# Patient Record
Sex: Male | Born: 1950 | Race: White | Hispanic: No | Marital: Married | Smoking: Never smoker
Health system: Southern US, Community
[De-identification: ages and names within clinical notes are randomized; demographics above are authoritative.]

## PROBLEM LIST (undated history)

## (undated) DIAGNOSIS — M5416 Radiculopathy, lumbar region: Secondary | ICD-10-CM

## (undated) DIAGNOSIS — C4490 Unspecified malignant neoplasm of skin, unspecified: Secondary | ICD-10-CM

## (undated) DIAGNOSIS — I4510 Unspecified right bundle-branch block: Secondary | ICD-10-CM

## (undated) DIAGNOSIS — I82409 Acute embolism and thrombosis of unspecified deep veins of unspecified lower extremity: Secondary | ICD-10-CM

## (undated) DIAGNOSIS — I679 Cerebrovascular disease, unspecified: Secondary | ICD-10-CM

## (undated) DIAGNOSIS — I779 Disorder of arteries and arterioles, unspecified: Secondary | ICD-10-CM

## (undated) DIAGNOSIS — K5792 Diverticulitis of intestine, part unspecified, without perforation or abscess without bleeding: Secondary | ICD-10-CM

## (undated) DIAGNOSIS — M51369 Other intervertebral disc degeneration, lumbar region without mention of lumbar back pain or lower extremity pain: Secondary | ICD-10-CM

## (undated) DIAGNOSIS — N400 Enlarged prostate without lower urinary tract symptoms: Secondary | ICD-10-CM

## (undated) DIAGNOSIS — I10 Essential (primary) hypertension: Secondary | ICD-10-CM

## (undated) DIAGNOSIS — E11319 Type 2 diabetes mellitus with unspecified diabetic retinopathy without macular edema: Secondary | ICD-10-CM

## (undated) DIAGNOSIS — M5136 Other intervertebral disc degeneration, lumbar region: Secondary | ICD-10-CM

## (undated) DIAGNOSIS — I219 Acute myocardial infarction, unspecified: Secondary | ICD-10-CM

## (undated) DIAGNOSIS — M1990 Unspecified osteoarthritis, unspecified site: Secondary | ICD-10-CM

## (undated) DIAGNOSIS — C801 Malignant (primary) neoplasm, unspecified: Secondary | ICD-10-CM

## (undated) DIAGNOSIS — K08109 Complete loss of teeth, unspecified cause, unspecified class: Secondary | ICD-10-CM

## (undated) DIAGNOSIS — I639 Cerebral infarction, unspecified: Secondary | ICD-10-CM

## (undated) DIAGNOSIS — I2510 Atherosclerotic heart disease of native coronary artery without angina pectoris: Secondary | ICD-10-CM

## (undated) DIAGNOSIS — Z789 Other specified health status: Secondary | ICD-10-CM

## (undated) DIAGNOSIS — E7800 Pure hypercholesterolemia, unspecified: Secondary | ICD-10-CM

## (undated) DIAGNOSIS — M4810 Ankylosing hyperostosis [Forestier], site unspecified: Secondary | ICD-10-CM

## (undated) DIAGNOSIS — I209 Angina pectoris, unspecified: Secondary | ICD-10-CM

## (undated) DIAGNOSIS — K219 Gastro-esophageal reflux disease without esophagitis: Secondary | ICD-10-CM

## (undated) DIAGNOSIS — E119 Type 2 diabetes mellitus without complications: Secondary | ICD-10-CM

## (undated) DIAGNOSIS — I2699 Other pulmonary embolism without acute cor pulmonale: Secondary | ICD-10-CM

## (undated) DIAGNOSIS — Z972 Presence of dental prosthetic device (complete) (partial): Secondary | ICD-10-CM

## (undated) DIAGNOSIS — D126 Benign neoplasm of colon, unspecified: Secondary | ICD-10-CM

## (undated) DIAGNOSIS — M48061 Spinal stenosis, lumbar region without neurogenic claudication: Secondary | ICD-10-CM

## (undated) DIAGNOSIS — G4730 Sleep apnea, unspecified: Secondary | ICD-10-CM

## (undated) HISTORY — PX: JOINT REPLACEMENT: SHX530

## (undated) HISTORY — PX: BUNIONECTOMY: SHX129

## (undated) HISTORY — PX: TOTAL HIP ARTHROPLASTY: SHX124

## (undated) HISTORY — DX: Benign prostatic hyperplasia without lower urinary tract symptoms: N40.0

## (undated) HISTORY — PX: KNEE ARTHROSCOPY: SUR90

## (undated) HISTORY — PX: COLONOSCOPY: SHX174

## (undated) HISTORY — DX: Acute myocardial infarction, unspecified: I21.9

## (undated) HISTORY — PX: TOTAL KNEE ARTHROPLASTY: SHX125

## (undated) HISTORY — PX: OTHER SURGICAL HISTORY: SHX169

## (undated) HISTORY — PX: CHOLECYSTECTOMY: SHX55

---

## 2005-06-18 ENCOUNTER — Other Ambulatory Visit: Payer: Self-pay

## 2005-07-03 ENCOUNTER — Inpatient Hospital Stay: Payer: Self-pay | Admitting: General Practice

## 2006-10-05 ENCOUNTER — Ambulatory Visit: Payer: Self-pay | Admitting: General Practice

## 2007-01-08 ENCOUNTER — Ambulatory Visit: Payer: Self-pay | Admitting: Family Medicine

## 2007-01-08 ENCOUNTER — Ambulatory Visit: Payer: Self-pay | Admitting: Unknown Physician Specialty

## 2007-01-08 ENCOUNTER — Other Ambulatory Visit: Payer: Self-pay

## 2007-02-18 ENCOUNTER — Ambulatory Visit: Payer: Self-pay | Admitting: General Practice

## 2007-06-13 ENCOUNTER — Ambulatory Visit: Payer: Self-pay | Admitting: Gastroenterology

## 2008-04-09 ENCOUNTER — Ambulatory Visit: Payer: Self-pay | Admitting: Rheumatology

## 2008-10-19 ENCOUNTER — Ambulatory Visit: Payer: Self-pay | Admitting: Internal Medicine

## 2008-10-22 ENCOUNTER — Ambulatory Visit: Payer: Self-pay | Admitting: Internal Medicine

## 2008-10-27 ENCOUNTER — Ambulatory Visit: Payer: Self-pay | Admitting: Internal Medicine

## 2008-11-01 ENCOUNTER — Ambulatory Visit: Payer: Self-pay | Admitting: Internal Medicine

## 2008-11-15 ENCOUNTER — Ambulatory Visit: Payer: Self-pay | Admitting: Internal Medicine

## 2009-09-01 ENCOUNTER — Inpatient Hospital Stay: Payer: Self-pay | Admitting: Unknown Physician Specialty

## 2009-12-28 ENCOUNTER — Ambulatory Visit: Payer: Self-pay | Admitting: General Practice

## 2010-01-03 ENCOUNTER — Ambulatory Visit: Payer: Self-pay | Admitting: Anesthesiology

## 2010-01-06 ENCOUNTER — Ambulatory Visit: Payer: Self-pay | Admitting: General Practice

## 2010-04-07 ENCOUNTER — Ambulatory Visit: Payer: Self-pay | Admitting: Internal Medicine

## 2010-06-15 ENCOUNTER — Ambulatory Visit: Payer: Self-pay | Admitting: Podiatry

## 2010-12-19 ENCOUNTER — Ambulatory Visit: Payer: Self-pay | Admitting: General Practice

## 2011-01-01 ENCOUNTER — Inpatient Hospital Stay: Payer: Self-pay | Admitting: General Practice

## 2011-01-02 LAB — BASIC METABOLIC PANEL
BUN: 26 mg/dL — ABNORMAL HIGH (ref 7–18)
Chloride: 107 mmol/L (ref 98–107)
EGFR (Non-African Amer.): 56 — ABNORMAL LOW
Glucose: 147 mg/dL — ABNORMAL HIGH (ref 65–99)
Potassium: 4 mmol/L (ref 3.5–5.1)
Sodium: 142 mmol/L (ref 136–145)

## 2011-01-02 LAB — HEMOGLOBIN: HGB: 10.5 g/dL — ABNORMAL LOW (ref 13.0–18.0)

## 2011-01-03 LAB — BASIC METABOLIC PANEL
BUN: 22 mg/dL — ABNORMAL HIGH (ref 7–18)
Calcium, Total: 8.5 mg/dL (ref 8.5–10.1)
Co2: 23 mmol/L (ref 21–32)
Creatinine: 1.38 mg/dL — ABNORMAL HIGH (ref 0.60–1.30)
EGFR (African American): >60
EGFR (Non-African Amer.): 56 — ABNORMAL LOW
Osmolality: 278 (ref 275–301)
Sodium: 135 mmol/L — ABNORMAL LOW (ref 136–145)

## 2011-01-03 LAB — PLATELET COUNT: Platelet: 150 10*3/uL (ref 150–440)

## 2011-02-22 ENCOUNTER — Ambulatory Visit: Payer: Self-pay | Admitting: Internal Medicine

## 2011-10-25 ENCOUNTER — Ambulatory Visit: Payer: Self-pay | Admitting: General Practice

## 2011-10-25 LAB — BASIC METABOLIC PANEL
Calcium, Total: 9.2 mg/dL (ref 8.5–10.1)
Creatinine: 1.84 mg/dL — ABNORMAL HIGH (ref 0.60–1.30)
EGFR (African American): 45 — ABNORMAL LOW
EGFR (Non-African Amer.): 39 — ABNORMAL LOW
Glucose: 147 mg/dL — ABNORMAL HIGH (ref 65–99)
Osmolality: 289 (ref 275–301)
Potassium: 4.3 mmol/L (ref 3.5–5.1)
Sodium: 139 mmol/L (ref 136–145)

## 2011-10-25 LAB — URINALYSIS, COMPLETE
Bilirubin,UR: NEGATIVE
Glucose,UR: NEGATIVE mg/dL (ref 0–75)
Hyaline Cast: 13
Ketone: NEGATIVE
Ph: 5 (ref 4.5–8.0)
RBC,UR: NONE SEEN /HPF (ref 0–5)
Squamous Epithelial: NONE SEEN

## 2011-10-25 LAB — CBC
MCH: 33.3 pg (ref 26.0–34.0)
MCHC: 34.8 g/dL (ref 32.0–36.0)
MCV: 96 fL (ref 80–100)
Platelet: 220 10*3/uL (ref 150–440)
RBC: 4.14 10*6/uL — ABNORMAL LOW (ref 4.40–5.90)
RDW: 13.1 % (ref 11.5–14.5)
WBC: 9.2 10*3/uL (ref 3.8–10.6)

## 2011-10-25 LAB — MRSA PCR SCREENING

## 2011-11-05 ENCOUNTER — Inpatient Hospital Stay: Payer: Self-pay | Admitting: General Practice

## 2011-11-06 LAB — BASIC METABOLIC PANEL
Anion Gap: 9 (ref 7–16)
BUN: 24 mg/dL — ABNORMAL HIGH (ref 7–18)
Calcium, Total: 8.5 mg/dL (ref 8.5–10.1)
Chloride: 107 mmol/L (ref 98–107)
EGFR (African American): >60
EGFR (Non-African Amer.): 53 — ABNORMAL LOW
Glucose: 148 mg/dL — ABNORMAL HIGH (ref 65–99)
Osmolality: 281 (ref 275–301)
Potassium: 5.2 mmol/L — ABNORMAL HIGH (ref 3.5–5.1)
Sodium: 137 mmol/L (ref 136–145)

## 2011-11-07 LAB — BASIC METABOLIC PANEL
Anion Gap: 10 (ref 7–16)
BUN: 23 mg/dL — ABNORMAL HIGH (ref 7–18)
Calcium, Total: 8.5 mg/dL (ref 8.5–10.1)
Chloride: 103 mmol/L (ref 98–107)
EGFR (African American): >60
EGFR (Non-African Amer.): 59 — ABNORMAL LOW
Glucose: 198 mg/dL — ABNORMAL HIGH (ref 65–99)
Osmolality: 279 (ref 275–301)
Potassium: 3.9 mmol/L (ref 3.5–5.1)

## 2011-11-07 LAB — HEMOGLOBIN: HGB: 10.2 g/dL — ABNORMAL LOW (ref 13.0–18.0)

## 2012-08-22 ENCOUNTER — Ambulatory Visit: Payer: Self-pay | Admitting: Cardiology

## 2012-08-22 HISTORY — PX: LEFT HEART CATH AND CORONARY ANGIOGRAPHY: CATH118249

## 2013-03-03 ENCOUNTER — Ambulatory Visit: Payer: Self-pay | Admitting: General Practice

## 2013-03-03 DIAGNOSIS — I10 Essential (primary) hypertension: Secondary | ICD-10-CM

## 2013-03-03 LAB — APTT: Activated PTT: 29.2 secs (ref 23.6–35.9)

## 2013-03-03 LAB — CBC
HCT: 41.3 % (ref 40.0–52.0)
HGB: 14.2 g/dL (ref 13.0–18.0)
MCH: 33.4 pg (ref 26.0–34.0)
MCHC: 34.5 g/dL (ref 32.0–36.0)
MCV: 97 fL (ref 80–100)
PLATELETS: 167 10*3/uL (ref 150–440)
RBC: 4.26 10*6/uL — AB (ref 4.40–5.90)
RDW: 13.7 % (ref 11.5–14.5)
WBC: 6.5 10*3/uL (ref 3.8–10.6)

## 2013-03-03 LAB — BASIC METABOLIC PANEL
Anion Gap: 6 — ABNORMAL LOW (ref 7–16)
BUN: 23 mg/dL — AB (ref 7–18)
CALCIUM: 8.6 mg/dL (ref 8.5–10.1)
CREATININE: 1.48 mg/dL — AB (ref 0.60–1.30)
Chloride: 103 mmol/L (ref 98–107)
Co2: 27 mmol/L (ref 21–32)
EGFR (African American): 58 — ABNORMAL LOW
EGFR (Non-African Amer.): 50 — ABNORMAL LOW
GLUCOSE: 207 mg/dL — AB (ref 65–99)
Osmolality: 282 (ref 275–301)
Potassium: 3.9 mmol/L (ref 3.5–5.1)
Sodium: 136 mmol/L (ref 136–145)

## 2013-03-03 LAB — URINALYSIS, COMPLETE
BACTERIA: NONE SEEN
Bilirubin,UR: NEGATIVE
Blood: NEGATIVE
Glucose,UR: 50 mg/dL (ref 0–75)
Ketone: NEGATIVE
Leukocyte Esterase: NEGATIVE
Nitrite: NEGATIVE
PH: 6 (ref 4.5–8.0)
Protein: NEGATIVE
RBC,UR: 1 /HPF (ref 0–5)
SPECIFIC GRAVITY: 1.023 (ref 1.003–1.030)
SQUAMOUS EPITHELIAL: NONE SEEN
WBC UR: 1 /HPF (ref 0–5)

## 2013-03-03 LAB — PROTIME-INR
INR: 1
Prothrombin Time: 12.6 secs (ref 11.5–14.7)

## 2013-03-03 LAB — MRSA PCR SCREENING

## 2013-03-03 LAB — SEDIMENTATION RATE: Erythrocyte Sed Rate: 9 mm/hr (ref 0–20)

## 2013-03-04 LAB — URINE CULTURE

## 2013-03-18 ENCOUNTER — Inpatient Hospital Stay: Payer: Self-pay | Admitting: General Practice

## 2013-03-19 LAB — BASIC METABOLIC PANEL
Anion Gap: 4 — ABNORMAL LOW (ref 7–16)
BUN: 23 mg/dL — ABNORMAL HIGH (ref 7–18)
Calcium, Total: 8.1 mg/dL — ABNORMAL LOW (ref 8.5–10.1)
Chloride: 107 mmol/L (ref 98–107)
Co2: 27 mmol/L (ref 21–32)
Creatinine: 1.36 mg/dL — ABNORMAL HIGH (ref 0.60–1.30)
EGFR (African American): >60
EGFR (Non-African Amer.): 56 — ABNORMAL LOW
Glucose: 139 mg/dL — ABNORMAL HIGH (ref 65–99)
Osmolality: 282 (ref 275–301)
Potassium: 4.3 mmol/L (ref 3.5–5.1)
Sodium: 138 mmol/L (ref 136–145)

## 2013-03-19 LAB — PLATELET COUNT: PLATELETS: 148 10*3/uL — AB (ref 150–440)

## 2013-03-19 LAB — HEMOGLOBIN: HGB: 12.5 g/dL — ABNORMAL LOW (ref 13.0–18.0)

## 2013-03-20 LAB — BASIC METABOLIC PANEL
Anion Gap: 3 — ABNORMAL LOW (ref 7–16)
BUN: 27 mg/dL — ABNORMAL HIGH (ref 7–18)
CO2: 29 mmol/L (ref 21–32)
Calcium, Total: 8.5 mg/dL (ref 8.5–10.1)
Chloride: 105 mmol/L (ref 98–107)
Creatinine: 1.38 mg/dL — ABNORMAL HIGH (ref 0.60–1.30)
EGFR (African American): >60
EGFR (Non-African Amer.): 55 — ABNORMAL LOW
Glucose: 75 mg/dL (ref 65–99)
Osmolality: 278 (ref 275–301)
POTASSIUM: 3.6 mmol/L (ref 3.5–5.1)
Sodium: 137 mmol/L (ref 136–145)

## 2013-03-20 LAB — HEMOGLOBIN: HGB: 12.8 g/dL — AB (ref 13.0–18.0)

## 2013-03-20 LAB — PLATELET COUNT: PLATELETS: 156 10*3/uL (ref 150–440)

## 2013-04-06 ENCOUNTER — Encounter: Payer: Self-pay | Admitting: General Practice

## 2013-05-01 ENCOUNTER — Encounter: Payer: Self-pay | Admitting: General Practice

## 2013-11-05 ENCOUNTER — Ambulatory Visit: Payer: Self-pay | Admitting: Podiatry

## 2013-12-22 ENCOUNTER — Inpatient Hospital Stay: Payer: Self-pay | Admitting: Internal Medicine

## 2013-12-22 LAB — COMPREHENSIVE METABOLIC PANEL
AST: 42 U/L — AB (ref 15–37)
Albumin: 2.7 g/dL — ABNORMAL LOW (ref 3.4–5.0)
Alkaline Phosphatase: 113 U/L
Anion Gap: 11 (ref 7–16)
BILIRUBIN TOTAL: 0.8 mg/dL (ref 0.2–1.0)
BUN: 38 mg/dL — ABNORMAL HIGH (ref 7–18)
CALCIUM: 9.3 mg/dL (ref 8.5–10.1)
CO2: 22 mmol/L (ref 21–32)
CREATININE: 1.47 mg/dL — AB (ref 0.60–1.30)
Chloride: 96 mmol/L — ABNORMAL LOW (ref 98–107)
EGFR (Non-African Amer.): 52 — ABNORMAL LOW
Glucose: 345 mg/dL — ABNORMAL HIGH (ref 65–99)
Osmolality: 282 (ref 275–301)
Potassium: 3.7 mmol/L (ref 3.5–5.1)
SGPT (ALT): 55 U/L
Sodium: 129 mmol/L — ABNORMAL LOW (ref 136–145)
TOTAL PROTEIN: 6.9 g/dL (ref 6.4–8.2)

## 2013-12-22 LAB — CBC WITH DIFFERENTIAL/PLATELET
Basophil #: 0 10*3/uL (ref 0.0–0.1)
Basophil %: 0.2 %
EOS ABS: 0 10*3/uL (ref 0.0–0.7)
Eosinophil %: 0 %
HCT: 43 % (ref 40.0–52.0)
HGB: 14.4 g/dL (ref 13.0–18.0)
Lymphocyte #: 0.4 10*3/uL — ABNORMAL LOW (ref 1.0–3.6)
Lymphocyte %: 2.1 %
MCH: 32.4 pg (ref 26.0–34.0)
MCHC: 33.5 g/dL (ref 32.0–36.0)
MCV: 97 fL (ref 80–100)
Monocyte #: 1.1 x10 3/mm — ABNORMAL HIGH (ref 0.2–1.0)
Monocyte %: 5.1 %
NEUTROS ABS: 19.4 10*3/uL — AB (ref 1.4–6.5)
Neutrophil %: 92.6 %
PLATELETS: 166 10*3/uL (ref 150–440)
RBC: 4.45 10*6/uL (ref 4.40–5.90)
RDW: 13.9 % (ref 11.5–14.5)
WBC: 20.9 10*3/uL — ABNORMAL HIGH (ref 3.8–10.6)

## 2013-12-22 LAB — URINALYSIS, COMPLETE
Bacteria: NONE SEEN
Bilirubin,UR: NEGATIVE
LEUKOCYTE ESTERASE: NEGATIVE
Nitrite: NEGATIVE
PH: 6 (ref 4.5–8.0)
Protein: 30
Specific Gravity: 1.022 (ref 1.003–1.030)
Squamous Epithelial: <1
WBC UR: 1 /HPF (ref 0–5)

## 2013-12-22 LAB — BODY FLUID CELL COUNT WITH DIFFERENTIAL
BASOS ABS: 0 %
Eosinophil: 0 %
Lymphocytes: 0 %
Neutrophils: 83 %
Nucleated Cell Count: 211129 /mm3
OTHER CELLS BF: 0 %
OTHER MONONUCLEAR CELLS: 17 %

## 2013-12-22 LAB — SYNOVIAL FLUID, CRYSTAL: Crystals, Joint Fluid: NONE SEEN

## 2013-12-23 LAB — CBC WITH DIFFERENTIAL/PLATELET
Basophil #: 0 10*3/uL (ref 0.0–0.1)
Basophil %: 0.1 %
EOS PCT: 0.1 %
Eosinophil #: 0 10*3/uL (ref 0.0–0.7)
HCT: 37 % — ABNORMAL LOW (ref 40.0–52.0)
HGB: 12.5 g/dL — ABNORMAL LOW (ref 13.0–18.0)
Lymphocyte #: 0.7 10*3/uL — ABNORMAL LOW (ref 1.0–3.6)
Lymphocyte %: 4.6 %
MCH: 32.5 pg (ref 26.0–34.0)
MCHC: 33.8 g/dL (ref 32.0–36.0)
MCV: 96 fL (ref 80–100)
Monocyte #: 1.2 x10 3/mm — ABNORMAL HIGH (ref 0.2–1.0)
Monocyte %: 7.6 %
Neutrophil #: 13.4 10*3/uL — ABNORMAL HIGH (ref 1.4–6.5)
Neutrophil %: 87.6 %
Platelet: 154 10*3/uL (ref 150–440)
RBC: 3.85 10*6/uL — AB (ref 4.40–5.90)
RDW: 13.8 % (ref 11.5–14.5)
WBC: 15.2 10*3/uL — ABNORMAL HIGH (ref 3.8–10.6)

## 2013-12-23 LAB — BASIC METABOLIC PANEL
Anion Gap: 7 (ref 7–16)
BUN: 33 mg/dL — ABNORMAL HIGH (ref 7–18)
CHLORIDE: 98 mmol/L (ref 98–107)
CO2: 28 mmol/L (ref 21–32)
Calcium, Total: 8.9 mg/dL (ref 8.5–10.1)
Creatinine: 1.31 mg/dL — ABNORMAL HIGH (ref 0.60–1.30)
EGFR (African American): >60
EGFR (Non-African Amer.): 59 — ABNORMAL LOW
GLUCOSE: 172 mg/dL — AB (ref 65–99)
OSMOLALITY: 278 (ref 275–301)
POTASSIUM: 3.4 mmol/L — AB (ref 3.5–5.1)
Sodium: 133 mmol/L — ABNORMAL LOW (ref 136–145)

## 2013-12-24 LAB — CBC WITH DIFFERENTIAL/PLATELET
BASOS PCT: 0.3 %
Basophil #: 0 10*3/uL (ref 0.0–0.1)
Eosinophil #: 0 10*3/uL (ref 0.0–0.7)
Eosinophil %: 0.1 %
HCT: 36.5 % — AB (ref 40.0–52.0)
HGB: 12.3 g/dL — ABNORMAL LOW (ref 13.0–18.0)
LYMPHS ABS: 0.9 10*3/uL — AB (ref 1.0–3.6)
Lymphocyte %: 7.4 %
MCH: 32.5 pg (ref 26.0–34.0)
MCHC: 33.6 g/dL (ref 32.0–36.0)
MCV: 97 fL (ref 80–100)
MONO ABS: 0.9 x10 3/mm (ref 0.2–1.0)
MONOS PCT: 7.7 %
NEUTROS ABS: 9.9 10*3/uL — AB (ref 1.4–6.5)
Neutrophil %: 84.5 %
Platelet: 178 10*3/uL (ref 150–440)
RBC: 3.77 10*6/uL — ABNORMAL LOW (ref 4.40–5.90)
RDW: 14.2 % (ref 11.5–14.5)
WBC: 11.7 10*3/uL — ABNORMAL HIGH (ref 3.8–10.6)

## 2013-12-24 LAB — BASIC METABOLIC PANEL
Anion Gap: 9 (ref 7–16)
BUN: 23 mg/dL — ABNORMAL HIGH (ref 7–18)
CHLORIDE: 98 mmol/L (ref 98–107)
Calcium, Total: 8.7 mg/dL (ref 8.5–10.1)
Co2: 26 mmol/L (ref 21–32)
Creatinine: 1.16 mg/dL (ref 0.60–1.30)
EGFR (Non-African Amer.): >60
Glucose: 150 mg/dL — ABNORMAL HIGH (ref 65–99)
Osmolality: 273 (ref 275–301)
POTASSIUM: 3.6 mmol/L (ref 3.5–5.1)
Sodium: 133 mmol/L — ABNORMAL LOW (ref 136–145)

## 2013-12-25 LAB — CBC WITH DIFFERENTIAL/PLATELET
BASOS ABS: 0 10*3/uL (ref 0.0–0.1)
Basophil %: 0.1 %
EOS PCT: 0.7 %
Eosinophil #: 0.1 10*3/uL (ref 0.0–0.7)
HCT: 36.6 % — ABNORMAL LOW (ref 40.0–52.0)
HGB: 12.2 g/dL — ABNORMAL LOW (ref 13.0–18.0)
LYMPHS ABS: 1 10*3/uL (ref 1.0–3.6)
Lymphocyte %: 8.8 %
MCH: 32.3 pg (ref 26.0–34.0)
MCHC: 33.2 g/dL (ref 32.0–36.0)
MCV: 97 fL (ref 80–100)
MONO ABS: 0.9 x10 3/mm (ref 0.2–1.0)
Monocyte %: 7.7 %
Neutrophil #: 9.7 10*3/uL — ABNORMAL HIGH (ref 1.4–6.5)
Neutrophil %: 82.7 %
PLATELETS: 234 10*3/uL (ref 150–440)
RBC: 3.77 10*6/uL — ABNORMAL LOW (ref 4.40–5.90)
RDW: 13.9 % (ref 11.5–14.5)
WBC: 11.7 10*3/uL — ABNORMAL HIGH (ref 3.8–10.6)

## 2013-12-25 LAB — BASIC METABOLIC PANEL
ANION GAP: 5 — AB (ref 7–16)
BUN: 19 mg/dL — AB (ref 7–18)
CHLORIDE: 97 mmol/L — AB (ref 98–107)
CO2: 29 mmol/L (ref 21–32)
Calcium, Total: 8.6 mg/dL (ref 8.5–10.1)
Creatinine: 1.08 mg/dL (ref 0.60–1.30)
EGFR (Non-African Amer.): >60
Glucose: 145 mg/dL — ABNORMAL HIGH (ref 65–99)
OSMOLALITY: 268 (ref 275–301)
POTASSIUM: 3.8 mmol/L (ref 3.5–5.1)
Sodium: 131 mmol/L — ABNORMAL LOW (ref 136–145)

## 2013-12-26 LAB — CBC WITH DIFFERENTIAL/PLATELET
Basophil #: 0 10*3/uL (ref 0.0–0.1)
Basophil %: 0.2 %
Eosinophil #: 0.2 10*3/uL (ref 0.0–0.7)
Eosinophil %: 1.4 %
HCT: 36.9 % — AB (ref 40.0–52.0)
HGB: 12.3 g/dL — ABNORMAL LOW (ref 13.0–18.0)
LYMPHS PCT: 9.1 %
Lymphocyte #: 1.1 10*3/uL (ref 1.0–3.6)
MCH: 32.6 pg (ref 26.0–34.0)
MCHC: 33.4 g/dL (ref 32.0–36.0)
MCV: 98 fL (ref 80–100)
MONO ABS: 1.1 x10 3/mm — AB (ref 0.2–1.0)
MONOS PCT: 8.5 %
NEUTROS PCT: 80.8 %
Neutrophil #: 10.2 10*3/uL — ABNORMAL HIGH (ref 1.4–6.5)
PLATELETS: 258 10*3/uL (ref 150–440)
RBC: 3.79 10*6/uL — ABNORMAL LOW (ref 4.40–5.90)
RDW: 13.9 % (ref 11.5–14.5)
WBC: 12.6 10*3/uL — ABNORMAL HIGH (ref 3.8–10.6)

## 2013-12-26 LAB — BASIC METABOLIC PANEL
ANION GAP: 7 (ref 7–16)
BUN: 17 mg/dL (ref 7–18)
Calcium, Total: 8.9 mg/dL (ref 8.5–10.1)
Chloride: 103 mmol/L (ref 98–107)
Co2: 27 mmol/L (ref 21–32)
Creatinine: 0.92 mg/dL (ref 0.60–1.30)
EGFR (African American): >60
Glucose: 96 mg/dL (ref 65–99)
Osmolality: 275 (ref 275–301)
POTASSIUM: 3.9 mmol/L (ref 3.5–5.1)
SODIUM: 137 mmol/L (ref 136–145)

## 2013-12-26 LAB — HEPATIC FUNCTION PANEL A (ARMC)
ALBUMIN: 1.8 g/dL — AB (ref 3.4–5.0)
Alkaline Phosphatase: 136 U/L — ABNORMAL HIGH
BILIRUBIN TOTAL: 0.4 mg/dL (ref 0.2–1.0)
Bilirubin, Direct: 0.1 mg/dL (ref 0.0–0.2)
SGOT(AST): 61 U/L — ABNORMAL HIGH (ref 15–37)
SGPT (ALT): 128 U/L — ABNORMAL HIGH
TOTAL PROTEIN: 5.5 g/dL — AB (ref 6.4–8.2)

## 2013-12-26 LAB — CLOSTRIDIUM DIFFICILE(ARMC)

## 2013-12-26 LAB — LIPASE, BLOOD: Lipase: 90 U/L (ref 73–393)

## 2013-12-26 LAB — AMYLASE: Amylase: 21 U/L — ABNORMAL LOW (ref 25–115)

## 2013-12-27 LAB — CBC WITH DIFFERENTIAL/PLATELET
Basophil #: 0 10*3/uL (ref 0.0–0.1)
Basophil %: 0.3 %
EOS ABS: 0.2 10*3/uL (ref 0.0–0.7)
Eosinophil %: 1.4 %
HCT: 36.4 % — ABNORMAL LOW (ref 40.0–52.0)
HGB: 12.2 g/dL — ABNORMAL LOW (ref 13.0–18.0)
Lymphocyte #: 1.1 10*3/uL (ref 1.0–3.6)
Lymphocyte %: 8.5 %
MCH: 32.6 pg (ref 26.0–34.0)
MCHC: 33.6 g/dL (ref 32.0–36.0)
MCV: 97 fL (ref 80–100)
Monocyte #: 1 x10 3/mm (ref 0.2–1.0)
Monocyte %: 8.2 %
NEUTROS ABS: 10.3 10*3/uL — AB (ref 1.4–6.5)
Neutrophil %: 81.6 %
PLATELETS: 301 10*3/uL (ref 150–440)
RBC: 3.76 10*6/uL — AB (ref 4.40–5.90)
RDW: 14.1 % (ref 11.5–14.5)
WBC: 12.6 10*3/uL — AB (ref 3.8–10.6)

## 2013-12-27 LAB — CULTURE, BLOOD (SINGLE)

## 2013-12-28 ENCOUNTER — Encounter: Payer: Self-pay | Admitting: Internal Medicine

## 2013-12-28 LAB — HEPATIC FUNCTION PANEL A (ARMC)
Albumin: 1.7 g/dL — ABNORMAL LOW (ref 3.4–5.0)
Alkaline Phosphatase: 139 U/L — ABNORMAL HIGH
Bilirubin, Direct: <0.1 mg/dL (ref 0.0–0.2)
Bilirubin,Total: 0.4 mg/dL (ref 0.2–1.0)
SGOT(AST): 32 U/L (ref 15–37)
SGPT (ALT): 87 U/L — ABNORMAL HIGH
Total Protein: 5.8 g/dL — ABNORMAL LOW (ref 6.4–8.2)

## 2013-12-28 LAB — BODY FLUID CULTURE

## 2013-12-29 LAB — SEDIMENTATION RATE: Erythrocyte Sed Rate: 68 mm/hr — ABNORMAL HIGH (ref 0–20)

## 2013-12-29 LAB — COMPREHENSIVE METABOLIC PANEL
Albumin: 1.8 g/dL — ABNORMAL LOW (ref 3.4–5.0)
Alkaline Phosphatase: 154 U/L — ABNORMAL HIGH
Anion Gap: 17 — ABNORMAL HIGH (ref 7–16)
BUN: 15 mg/dL (ref 7–18)
Bilirubin,Total: 0.4 mg/dL (ref 0.2–1.0)
CREATININE: 1 mg/dL (ref 0.60–1.30)
Calcium, Total: 8.3 mg/dL — ABNORMAL LOW (ref 8.5–10.1)
Chloride: 104 mmol/L (ref 98–107)
Co2: 26 mmol/L (ref 21–32)
EGFR (African American): >60
EGFR (Non-African Amer.): >60
GLUCOSE: 142 mg/dL — AB (ref 65–99)
Osmolality: 296 (ref 275–301)
POTASSIUM: 4.3 mmol/L (ref 3.5–5.1)
SGOT(AST): 37 U/L (ref 15–37)
SGPT (ALT): 87 U/L — ABNORMAL HIGH
SODIUM: 147 mmol/L — AB (ref 136–145)
Total Protein: 6.3 g/dL — ABNORMAL LOW (ref 6.4–8.2)

## 2013-12-29 LAB — CBC WITH DIFFERENTIAL/PLATELET
BASOS PCT: 0.6 %
Basophil #: 0.1 10*3/uL (ref 0.0–0.1)
EOS PCT: 1.3 %
Eosinophil #: 0.2 10*3/uL (ref 0.0–0.7)
HCT: 36.5 % — ABNORMAL LOW (ref 40.0–52.0)
HGB: 12.3 g/dL — ABNORMAL LOW (ref 13.0–18.0)
Lymphocyte #: 1.2 10*3/uL (ref 1.0–3.6)
Lymphocyte %: 8.4 %
MCH: 32.3 pg (ref 26.0–34.0)
MCHC: 33.5 g/dL (ref 32.0–36.0)
MCV: 96 fL (ref 80–100)
MONOS PCT: 8.8 %
Monocyte #: 1.2 x10 3/mm — ABNORMAL HIGH (ref 0.2–1.0)
NEUTROS ABS: 11.4 10*3/uL — AB (ref 1.4–6.5)
Neutrophil %: 80.9 %
PLATELETS: 348 10*3/uL (ref 150–440)
RBC: 3.79 10*6/uL — ABNORMAL LOW (ref 4.40–5.90)
RDW: 13.8 % (ref 11.5–14.5)
WBC: 14.1 10*3/uL — ABNORMAL HIGH (ref 3.8–10.6)

## 2013-12-30 LAB — WOUND CULTURE

## 2014-01-01 ENCOUNTER — Encounter: Payer: Self-pay | Admitting: Internal Medicine

## 2014-01-05 LAB — COMPREHENSIVE METABOLIC PANEL
ALT: 74 U/L — AB
ANION GAP: 6 — AB (ref 7–16)
Albumin: 2.3 g/dL — ABNORMAL LOW (ref 3.4–5.0)
Alkaline Phosphatase: 197 U/L — ABNORMAL HIGH
BILIRUBIN TOTAL: 0.5 mg/dL (ref 0.2–1.0)
BUN: 43 mg/dL — AB (ref 7–18)
CO2: 29 mmol/L (ref 21–32)
Calcium, Total: 9.2 mg/dL (ref 8.5–10.1)
Chloride: 97 mmol/L — ABNORMAL LOW (ref 98–107)
Creatinine: 1.61 mg/dL — ABNORMAL HIGH (ref 0.60–1.30)
GFR CALC AF AMER: 56 — AB
GFR CALC NON AF AMER: 46 — AB
Glucose: 155 mg/dL — ABNORMAL HIGH (ref 65–99)
OSMOLALITY: 278 (ref 275–301)
Potassium: 3.9 mmol/L (ref 3.5–5.1)
SGOT(AST): 23 U/L (ref 15–37)
SODIUM: 132 mmol/L — AB (ref 136–145)
Total Protein: 7.2 g/dL (ref 6.4–8.2)

## 2014-01-05 LAB — CBC WITH DIFFERENTIAL/PLATELET
Basophil #: 0.1 10*3/uL (ref 0.0–0.1)
Basophil %: 1 %
Eosinophil #: 0.3 10*3/uL (ref 0.0–0.7)
Eosinophil %: 3 %
HCT: 36.1 % — ABNORMAL LOW (ref 40.0–52.0)
HGB: 12.2 g/dL — ABNORMAL LOW (ref 13.0–18.0)
Lymphocyte #: 1.7 10*3/uL (ref 1.0–3.6)
Lymphocyte %: 17.1 %
MCH: 32.3 pg (ref 26.0–34.0)
MCHC: 33.8 g/dL (ref 32.0–36.0)
MCV: 96 fL (ref 80–100)
Monocyte #: 1 x10 3/mm (ref 0.2–1.0)
Monocyte %: 10.2 %
Neutrophil #: 6.9 10*3/uL — ABNORMAL HIGH (ref 1.4–6.5)
Neutrophil %: 68.7 %
Platelet: 348 10*3/uL (ref 150–440)
RBC: 3.77 10*6/uL — AB (ref 4.40–5.90)
RDW: 14.1 % (ref 11.5–14.5)
WBC: 10 10*3/uL (ref 3.8–10.6)

## 2014-04-20 NOTE — Op Note (Signed)
PATIENT NAME:  Michael Humphrey, Michael Humphrey MR#:  [id number] DATE OF BIRTH:  1950/12/23  DATE OF PROCEDURE:  11/05/2011  PREOPERATIVE DIAGNOSIS: Degenerative arthrosis of the right hip.   POSTOPERATIVE DIAGNOSIS: Degenerative arthrosis of the right hip.   PROCEDURE PERFORMED: Right total hip arthroplasty.   SURGEON: Laurice Record. Holley Bouche., MD   ASSISTANT: Vance Peper, PA-C (required to maintain retraction throughout the procedure)   ANESTHESIA: Spinal.   ESTIMATED BLOOD LOSS: 100 mL.   FLUIDS REPLACED: 2000 mL of crystalloid.   DRAINS: Two medium drains to Hemovac reservoir.   IMPLANTS UTILIZED: DePuy 15 mm large stature AML femoral component, 56 mm outer diameter Pinnacle Gription Sector component, +4 mm neutral Pinnacle Marathon polyethylene liner, and a 36 mm aSphere femoral head with a +1.5 mm neck length.   INDICATIONS FOR SURGERY: The patient is a 64 year old male who has been seen for complaints of progressive right hip and groin pain with associated limited range of motion. X-rays demonstrated narrowing of the cartilage space consistent with degenerative arthrosis. After discussion of the risks and benefits of surgical intervention, the patient expressed understanding of the risks, benefits, and agreed with plans for surgical intervention.   PROCEDURE IN DETAIL: The patient was brought into the Operating Room, and after adequate spinal anesthesia was achieved the patient was placed in a left lateral decubitus position. An axillary roll was placed and all bony prominences were well padded. The patient's right hip and leg were cleaned and prepped with alcohol and DuraPrep and draped in the usual sterile fashion. A "timeout" was performed as per usual protocol. A lateral curvilinear incision was made gently curving towards the posterior superior iliac spine. The IT band was incised in line with the skin incision, and the fibers of the gluteus maximus were split in line. The piriformis tendon was  identified, skeletonized, and incised at its insertion at the proximal femur and reflected posteriorly. In a similar fashion, short external rotators were incised and reflected posteriorly. A T-type posterior capsulotomy was performed. Prior to dislocation of the femoral head, a threaded Steinmann pin was inserted through a separate stab incision into the pelvis superior to the acetabulum and then bent in the form of a stylus so as to assess limb length and hip offset throughout the procedure. The femoral head was then dislocated posteriorly. Degenerative changes were noted primarily to the superior aspect of the femoral head. The femoral neck cut was performed using an oscillating saw. The anterior capsule was elevated off of the femoral neck using a periosteal elevator. Inspection of the acetabulum also demonstrated degenerative changes. The remnant of the labrum was excised. The acetabulum was reamed in a sequential fashion up to a 55 mm diameter. Good punctate bleeding bone was encountered. A 56 mm outer diameter Pinnacle Gription Sector acetabular component was positioned and impacted into place. Good scratch fit was appreciated. A +4 mm neutral polyethylene trial was inserted, and attention was directed to the proximal femur. A pilot hole for reaming of the proximal femoral canal was created using a high-speed bur. The proximal femoral canal was reamed in sequential fashion up to a 14.5 mm diameter. This allowed for approximately 5.5 to 6 cm of scratch fit. A 50 mm aggressive side-biting reamer was used to prepare the calcar region. Serial broaches were inserted up to a 15 mm large stature broach. The calcar region was planed accordingly and trial reduction was performed with a 36 mm hip ball with a +1.5 mm neck length. This allowed  for excellent stability both anteriorly and posteriorly and good restoration of hip offset and limb length. Trial components were removed. The acetabular shell was irrigated and  suctioned dry. A +4 mm neutral Pinnacle Marathon polyethylene liner was positioned and impacted into place. Next, a 15 mm large stature AML femoral component was positioned and impacted into place. Excellent scratch fit was appreciated. Trial reduction was again performed with a +1.5 mm neck length. Again, excellent stability and maintenance of limb length was appreciated. The trial hip ball was removed. The Morse taper was cleaned and dried. A 36 mm outer diameter aSphere femoral head with a +1.5 mm neck length was placed on the trunnion and impacted into place. The hip was reduced and placed through a range of motion. Excellent stability was appreciated and good restoration of hip offset and equalization of limb lengths was noted.   The wound was irrigated with copious amounts of normal saline with antibiotic solution using pulsatile lavage and then suctioned dry. Good hemostasis was appreciated. The posterior capsulotomy was repaired using #5 Ethibond. The piriformis tendon was reapproximated to the undersurface of the radius gluteus medius tendon using #5 Ethibond. The previously released gluteal sling was also repaired using interrupted sutures of #5 Ethibond. Two medium drains were placed in the wound bed and brought out through a separate stab incision to be attached to the Hemovac reservoir. The IT band was repaired using interrupted sutures of #1 Vicryl. The subcutaneous tissue was approximated in layers using first 0 Vicryl, followed by 2-0 Vicryl. The skin was reapproximated using skin staples. Sterile dressing was applied.   The patient tolerated the procedure well. He was transported to the recovery room in stable condition.  ____________________________ Laurice Record. Holley Bouche., MD [id number] D: 11/05/2011 22:21:09 ET T: 11/06/2011 09:34:29 ET JOB#: [id number]  cc: Laurice Record. Holley Bouche., MD, <Dictator> Richar P Holley Bouche MD ELECTRONICALLY SIGNED 11/06/2011 20:19

## 2014-04-20 NOTE — Discharge Summary (Signed)
PATIENT NAME:  Michael Humphrey, Michael Humphrey MR#:  [id number] DATE OF BIRTH:  1950-02-25  DATE OF ADMISSION:  11/05/2011 DATE OF DISCHARGE:  11/08/2011  ADMITTING DIAGNOSIS: Degenerative arthrosis of the right hip.   DISCHARGE DIAGNOSIS: Degenerative arthrosis of the right hip.  HISTORY: The patient is a 64 year old who has been followed at [facility] for progression of right hip pain. The patient had reported a greater than one year history of right hip and groin pain. He denied any radiation of pain down the leg. He appreciated progressive loss in his right hip range of motion. He denied any gross thigh pain. At the time of surgery, he was not using any ambulatory aid. He had attempted to continue with his regular duty at work but was having increasing difficulty because of severity of pain. It had gotten to the point where he was using Tylenol to help with pain control. It was recommended that he avoid nonsteroidal antiinflammatory medications due to his diabetes and diverticulosis. The patient states that the right hip pain had progressed to the point that it was significantly interfering with his activities of daily living. X-rays taken in the [facility] orthopedic department showed narrowing of the cartilage space with subchondral sclerosis. After discussion of the risks and benefits of surgical intervention, the patient expressed his understanding of the risks and benefits and agreed with plans for surgical intervention.   PROCEDURE: Right total hip arthroplasty.   ANESTHESIA: Spinal.   IMPLANTS UTILIZED: DePuy 15 mm large statue AML femoral component, 56 mm outer diameter Pinnacle Gription sector component, a +4 mm neutral Pinnacle Marathon polyethylene liner, and a 36 mm aSphere femoral head with a +1.5 mm neck length.   HOSPITAL COURSE: The patient tolerated the procedure very well. He had no complications. He was then taken to PAC-U where he was stabilized and then transferred to the orthopedic  floor. He began receiving anticoagulation therapy of Lovenox 30 mg every 12 hours. He was fitted with TED stockings bilaterally. These were allowed to be removed one hour per eight hour shift. The patient was also fitted with the AV-I compression foot pumps set at 80 mmHg bilaterally. He has voiced no complaints of lower extremity discomfort, no evidence of any deep venous thromboses, and calves have been nontender. Heels were elevated off the bed using rolled towels.   The patient has denied any chest pain or shortness of breath. Vital signs have been stable. He has been afebrile. Hemodynamically he was stable and no transfusions were given.   Physical therapy was initiated on day one for gait training and transfers. Upon being discharged he was ambulating greater than 200 feet. He was able to go up and down four sets of steps. He was independent with bed to chair transfers. Occupational therapy was also initiated on day one for activities of daily living and assistive devices.   The patient's IV, Foley, and Hemovac were discontinued on day two along with a dressing change. The wound was free of any drainage or signs of infection.   The patient is discharged to home in improved stable condition.   DISCHARGE INSTRUCTIONS: He may weight bear as tolerated. I did go over hip precautions once again. Continue using a Zeiss until cleared by physical therapy to go to a quad cane. He will receive home health physical therapy. Staples are to be removed at two weeks postoperative followed by the application of benzoin and Steri-Strips. He is not to take a shower until the staples are  all removed. He has a follow-up appointment in six weeks with Dr. Marry Guan. He is to call the clinic sooner if any temperatures of 101.5 or greater or excessive bleeding. He is placed on an ADA diet. He is to resume his regular medications that he was on prior to admission. He  was given a prescription for Roxicodone 5 mg 1 to 2 tablets  every 4 to 6 hours p.r.n. for pain, dispensing 80, and Ultram 50 to 100 mg, dispensing 60, with one refill, 1 to 2 tablets every 4 to 6 hours p.r.n. for pain.   PAST MEDICAL HISTORY:  1. Arthritis.  2. Diabetes. 3. Hypertension.  4. Hyperlipidemia.         5. Diverticulitis.  6. Obesity.  ____________________________ Vance Peper, PA [id number] D: 11/08/2011 07:13:50 ET T: 11/08/2011 12:27:29 ET JOB#: [id number]  cc: Vance Peper, PA, <Dictator> JON WOLFE PA ELECTRONICALLY SIGNED 11/08/2011 19:03

## 2014-04-24 NOTE — Discharge Summary (Signed)
PATIENT NAME:  Michael Humphrey, Michael Humphrey MR#:  [id number] DATE OF BIRTH:  09-Oct-1950  DATE OF ADMISSION:  12/22/2013 DATE OF DISCHARGE:  12/29/2013  DISCHARGE DIAGNOSES:  1. Left ankle joint infection with group B Streptococcus.  2. Cellulitis with Streptococcus.   3. Abdominal pain and nausea, improving with mildly abnormal liver function tests.  4. Type 2 diabetes, controlled. Metformin stopped for gastrointestinal distress.   DISCHARGE MEDICATIONS: Per [facility] medication reconciliation. He will basically be on his usual medications except for glipizide once a day and sliding scale insulin not metformin as well as ceftriaxone IV for 6 weeks via his PICC line.   HISTORY AND PHYSICAL: Please see detailed history and physical done on admission.   HOSPITAL COURSE: The patient was admitted with left leg cellulitis, also in his ankle joint. I and D was done of multiple ankle joints. Drains were placed. Group B Streptococcus grew out, sensitive to ceftriaxone. ID saw him and recommended 6 weeks of IV antibiotics. He will be followed closely by podiatry who had seen him as outpatient prior to this. White count came down, but still persistently elevated at 14,100 this morning. GFR is greater than 60. Albumin is depressed at 1.8. ALT is 87. Alkaline phosphatase 154, which are all somewhat abnormal but he is eating well with no GI distress. Sedimentation rate 68 today as well. Glucose has been relatively well controlled with the above. May go back to long acting insulin which he has had in the past if this is persistently elevated on a carbohydrate controlled diet going forward. He is still weak, not able to ambulate well, severe ankle pain persist; although, he was confused on oxycodone and this seems to be relatively controlled on tramadol. I had to titrate that up going forward.   Please note it took approximately 35 minutes to do discharge tasks today.    ____________________________ Ocie Cornfield. Ouida Sills,  MD [id number] D: 12/29/2013 07:46:55 ET T: 12/29/2013 08:03:52 ET JOB#: [id number]  cc: Ocie Cornfield. Ouida Sills, MD, <Dictator> Kirk Ruths MD ELECTRONICALLY SIGNED 12/29/2013 9:26

## 2014-04-24 NOTE — Discharge Summary (Signed)
PATIENT NAME:  Michael Humphrey, Michael Humphrey MR#:  [id number] DATE OF BIRTH:  08/08/1950  DATE OF ADMISSION:  03/18/2013 DATE OF DISCHARGE:  03/20/2013   DICTATING FOR: Jeneen Rinks P. Holley Bouche., MD  ADMITTING DIAGNOSIS: Degenerative arthrosis of the left knee.   DISCHARGE DIAGNOSIS: Degenerative arthrosis of the left knee.   HISTORY: The patient is a 64 year old gentleman who has been followed at [facility] for progression of left knee pain. The patient was having a significant amount of difficulty getting around at times, but had continued to work. He had continued to report progressive left knee pain especially along the lateral aspect of the knee. He also had continued to have stiffness of the knee as well as significant swelling, but denied any locking or giving-way of the knee. The patient reports some anterior knee pain with stair ambulation as well as climbing ladders. The patient states that the left knee pain had progressed to the point that it was significantly interfering with his activities of daily living. X-rays taken in [facility] showed narrowing of the lateral cartilage space with associated valgus alignment. Osteophyte as well as subchondral sclerosis were noted. The patient was also noted to have degenerative changes to the patellofemoral articulation. After discussion of the risks and benefits of surgical intervention, the patient expressed his understanding of the risks and benefits and agreed for plans for surgical intervention.   PROCEDURE: Left total knee arthroplasty using computer-assisted navigation.   ANESTHESIA: Spinal.   SOFT TISSUE RELEASE: Anterior cruciate ligament, posterior cruciate ligament, deep medial collateral ligaments, patellofemoral ligament and the posterior lateral corner.   IMPLANTS UTILIZED: DePuy PFC Sigma size 4 posterior stabilized femoral component (cemented), size 4 MBT tibial component (cemented), 38 mm 3 pegged oval dome patella (cemented),  and a 10 mm stabilized rotating platform polyethylene insert. Gentamicin cement was utilized secondary to weight and diabetes.   HOSPITAL COURSE: The patient tolerated the procedure very well. He had no complications. He was then taken to the PACU, where he was stabilized and then transferred to the orthopedic floor. The patient began receiving anticoagulation therapy of Lovenox 30 mg subcutaneous q.12 hours per anesthesia and pharmacy protocol. He was fitted with TED stockings bilaterally. These were allowed to be removed 1 hour per 8-hour shift. The left one was applied on day 2 following removal of the Hemovac and dressing change. The patient was also fitted with the AVI compression foot pumps bilaterally set at 80 mmHg. His calves have been nontender. There has been no evidence of any DVTs. Negative Homans sign. Heels were elevated off the bed using rolled towels.   The patient has denied any chest pains or shortness of breath. Vital signs have been stable. He has been afebrile. Hemodynamically, he was stable. No transfusions were given other than the Autovac transfusion given the first 6 hours postoperatively.   The patient's IV, Foley and Hemovac were discontinued on day 2 along with a dressing change. The wound was free of any drainage or signs of infection, but was noted to have a lot of bruising to the anterior region of the knee. He states that he bruises very easily and this is not uncommon. There was minimal swelling.   Physical therapy was initiated on day 1 for gait training and transfers. Upon being discharged, he was ambulating greater than 200 feet. He was able go up and down 4 sets of steps. He was independent with bed to chair transfers.   DISPOSITION: The patient is being discharged  to home in improved stable condition.   DISCHARGE INSTRUCTIONS: He is to continue weight-bearing as tolerated. Continue using a Pfohl until cleared by physical therapy to go to a quad cane. He will  receive home health PT. He was instructed on wearing the TED stockings during the day but may remove these at night. He is to continue using Polar Care, maintaining a temperature of 40 to 50 degrees Fahrenheit. For the first 2 weeks, I would like for the patient to wear the Polar Care as much as he can. He was instructed in wound care. He was given extra coverderms to take home to change. He should not need to change this more than 2 or 3 times. He is placed on an ADA diet. He has a followup appointment at [facility] on April 2nd at 8:45. He is to call the clinic sooner if any complications or any temperatures of 101.5 or greater.   DRUG ALLERGIES: CLONIDINE, DICLOFENAC, FIORICET, HYDROCODONE, MOBIC, NORVASC.   MEDICATIONS: The patient was given a prescription for Nucynta 50 mg 1 to 2 tablets q.4-6 hours p.r.n. for pain, Ultram 50 to 100 mg q.4-6 hours p.r.n. for pain, Lovenox 40 mg subcutaneously daily for 14 days and then discontinue and begin taking one 81 mg enteric-coated aspirin. He is to resume his regular medications that he was on prior to surgery.   PAST MEDICAL HISTORY: Diabetes, hypertension, hypercholesterolemia, diverticulitis,  diabetic retinopathy, osteoarthritis.   ____________________________ Vance Peper, PA [id number] D: 03/20/2013 07:51:58 ET T: 03/20/2013 08:11:17 ET JOB#: [id number]  cc: Vance Peper, PA, <Dictator> JON WOLFE PA ELECTRONICALLY SIGNED 03/24/2013 7:31

## 2014-04-24 NOTE — Op Note (Signed)
PATIENT NAME:  Michael Humphrey, Michael Humphrey MR#:  [id number] DATE OF BIRTH:  1950-03-06  DATE OF PROCEDURE:  03/18/2013  PREOPERATIVE DIAGNOSIS: Degenerative arthrosis of the left knee.   POSTOPERATIVE DIAGNOSIS: Degenerative arthrosis of the left knee.   PROCEDURE PERFORMED: Left total knee arthroplasty using computer-assisted navigation.   SURGEON: Laurice Record. Holley Bouche., M.D.   ASSISTANT: Vance Peper, PA (required to maintain retraction throughout the procedure).   ANESTHESIA: Spinal.   ESTIMATED BLOOD LOSS: 75 mL.   FLUIDS REPLACED: 1300 mL of crystalloid.   TOURNIQUET TIME: 89 minutes.   DRAINS: Two medium drains to reinfusion system.  SOFT TISSUE RELEASES: Anterior cruciate ligament, posterior cruciate ligament, deep medial collateral ligament, patellofemoral ligament, and posterolateral corner.   IMPLANTS UTILIZED: DePuy POC Sigma size-4 posterior stabilized femoral component (cemented), size-4 MBT tibial component (cemented), 38-mm 3-peg oval dome patella (cemented), and a 10-mm stabilized rotating platform polyethylene insert. Gentamicin bone cement was utilized.   INDICATIONS FOR SURGERY: The patient is a 64 year old male who has been seen for complaints of progressive left knee pain. X-rays demonstrated severe degenerative changes in tricompartmental fashion with slight valgus deformity. After discussion of the risks and benefits of surgical intervention, the patient expressed understanding of the risks and benefits, and agreed with plans for surgical intervention.   PROCEDURE IN DETAIL: The patient was brought to the operating room, and after adequate spinal anesthesia was achieved, a tourniquet was placed on the patient's upper left thigh. The patient's left knee and leg were cleaned and prepped with alcohol and DuraPrep draped in the usual sterile fashion. A "timeout" was performed as per usual protocol. The left lower extremity was exsanguinated using an Esmarch, the tourniquet was inflated  to 300 mmHg. An anterior longitudinal incision was made followed by a standard mid vastus approach. A moderate effusion was evacuated. The deep fibers of the medial collateral ligament were elevated in a subperiosteal fashion off the medial flare of the tibia so as maintain a continuous soft tissue sleeve. The patella was subluxed laterally and the patellofemoral ligament was incised. Inspection of the knee demonstrated severe degenerative changes with full-thickness loss of articular cartilage to the lateral compartment. Prominent osteophytes were debrided using a rongeur. Anterior and posterior cruciate ligaments were excised. Two 4.0-mm Schanz pins were inserted into the femur and into the tibia for attachment of the array of trackers used for computer-assisted navigation. Hip center was identified using circumduction technique. Distal landmarks were mapped using the computer. The distal femur and proximal tibia were mapped using the computer. Distal femoral cutting guide was positioned using computer-assisted navigation so as to achieve a 5-degree distal valgus cut. Cut was performed and verified using the computer. Distal femur was then sized and it was felt that a size-4 femoral component was appropriate. A size-4 cutting guide was positioned and the anterior cut was performed verified using the computer. This was followed by completion of the posterior and chamfer cuts. Femoral cutting guide for central box was then positioned, and the central box cut was performed.   Attention was then directed to the proximal tibia. Medial and lateral menisci were excised. The extramedullary tibial cutting guide was positioned using computer-assisted navigation so as to achieve 0-degree varus valgus alignment and a 0-degree posterior slope. Cut was performed and verified using the computer. The proximal tibia was sized and it was felt that a size-4 tibial tray was appropriate. Tibial and femoral trials were inserted  followed by insertion of  a 10-mm polyethylene trial. It  was felt to be tight laterally. Trial components were removed and the knee was brought in full extension and distracted using the Moreland retractors. The posterolateral corner was carefully released using a combination of electrocautery and Metzenbaum scissors. This allowed for appropriate medial and lateral soft tissue balancing. Trial components were reinserted, followed by insertion of a 10-mm polyethylene trial. Excellent mediolateral soft tissue balancing was appreciated. Finally, the patella was cut and repaired so as to accommodate a 38-mm 3-peg oval dome patella. Patellar trial was placed and the knee was placed through a range of motion with excellent patellar tracking appreciated. The femoral trial was removed. Central post hole for the tibial component was reamed followed by insertion of a keel punch. Trial was then removed. Cut surfaces of bone were irrigated with copious amounts of normal saline with antibiotic solution using pulsatile lavage and then suctioned dry. Polymethyl methacrylate cement with gentamicin was prepared in the usual fashion using a vacuum mixer. Cement was applied to the cut surface of the proximal tibia, as well as along the undersurface of a size-4 MBT tibial component. The tibial component was positioned and impacted into place. Excess cement was removed using [employer] fast streamer. Cement was then applied to the cut surface of the femur, as well as on the posterior flanges of a size-4 posterior stabilized femoral component. Femoral component was positioned and impacted into place. Excess cement was removed using [employer] fast streamer. A 10-mm stabilized rotating platform polyethylene trial was inserted and the knee was brought in full extension with steady axial compression applied. Finally, cement was applied to the backside of a 38-mm 3-peg oval dome patella, and the patellar component was positioned and patellar clamp applied.  Excess cement was removed using [employer] fast streamer.   After adequate curing of cement, the tourniquet was deflated after a total tourniquet time of 89 minutes. Hemostasis was achieved using electrocautery. The knee was irrigated with copious amounts of normal saline with antibiotic solution using pulsatile lavage and then suctioned dry. The knee was inspected for any residual cement debris. 20 mL of 1.3% Exparel and 40 mL of normal saline was injected along the posterior capsule, medial and lateral gutters, and along the arthrotomy site. A 10-mm stabilized rotating platform polyethylene insert was inserted and the knee was placed through a range of motion with excellent patellar tracking appreciated and excellent mediolateral soft tissue balancing noted. Two medium drains were placed in the wound bed and brought out through a separate stab incision to be attached to a reinfusion system. The medial parapatellar portion of the incision was reapproximated using interrupted sutures of #1 Vicryl. The subcutaneous tissue was approximated in layers using first #0 Vicryl followed by 2-0 Vicryl. Then, 30 mL of 0.25% Marcaine with epinephrine was injected into the subcutaneous tissue in line with the incision. Skin was reapproximated using skin staples. A sterile dressing was applied.   The patient tolerated the procedure well. He was transported to the recovery room in stable condition.    ____________________________ Laurice Record. Holley Bouche., MD jph:am D: 03/19/2013 00:25:30 ET T: 03/19/2013 04:07:08 ET JOB#: [id number]  cc: Laurice Record. Holley Bouche., MD, <Dictator> Gurvir P Holley Bouche MD ELECTRONICALLY SIGNED 03/22/2013 8:45

## 2014-04-24 NOTE — H&P (Signed)
PATIENT NAME:  Michael Humphrey, Michael Humphrey MR#:  [id number] DATE OF BIRTH:  06-09-1950  DATE OF ADMISSION:  12/22/2013   PRIMARY CARE PHYSICIAN:  Frazier Richards, MD   CHIEF COMPLAINT: Left lower extremity redness, pain, and swelling.   HISTORY OF PRESENT ILLNESS: A 64 year old Caucasian male patient with a history of diabetes, hypertension, hyperlipidemia, presents to the Emergency Room with 3 days of left lower extremity redness, swelling and pain. This is progressively worsening, Here, the patient has been found to have sepsis, and with worsening cellulitis, being admitted to the hospitalist service. The patient has severe pain with the same and is needing multiple IV medications.   PAST MEDICAL HISTORY:  1.  Diabetes with retinopathy.  2.  Hypertension. 3.  Hyperlipidemia.   4.  Osteoarthritis.  5.  Spinal stenosis.  6.  Cholecystectomy.  7.  Bilateral knee surgeries.  8.  Also bilateral feet surgeries for lipomas and bunions.   ALLERGIES: CLONIDINE, DICLOFENAC, FIORICET, HYDROCODONE, MOBIC, NORVASC, PERCOCET.   FAMILY HISTORY: Hypertension, diabetes, congestive heart failure, coronary artery disease, and CVA.   SOCIAL HISTORY: The patient lives here in [place] with his wife.  He quit tobacco 20 years back. Occasional alcohol.   REVIEW OF SYSTEMS:  CONSTITUTIONAL: Complains of fatigue.  EYES: No blurred vision, pain.  ENT: No tinnitus, ear pain, hearing loss.  RESPIRATORY: No cough, wheeze, hemoptysis.  CARDIOVASCULAR: No chest pain, orthopnea, edema.  GASTROINTESTINAL: No nausea, vomiting, diarrhea, abdominal pain.  GENITOURINARY:  No dysuria, hematuria or urgency.  ENDOCRINE:  No polyuria, nocturia, thyroid problems.   HEMATOLOGIC AND LYMPHATIC: No anemia, easy bruising, bleeding.  INTEGUMENTARY: Has swelling, redness of his left lower extremity.  MUSCULOSKELETAL: Has severe pain of the left lower extremity.  NEUROLOGIC: No focal numbness, weakness.  PSYCHIATRIC: No anxiety or  depression.   HOME MEDICATIONS:  1.  Aspirin 81 mg daily. 2.  Coreg 3.125 mg 2 times a day. 3.  Calcium 800 mg 2 times a day.  4.  Glipizide-metformin 5/500 oral 2 times a day.  5.  Levaquin 5 mg oral once a day.  6.  Lisinopril 10 mg daily.  7. Nucynta 50 mg oral every 4 to 6 hours as needed.  8.  Prednisone taper started on 12/21/2013 for possible gout.  9.  Aldactone 25 mg daily.  10. Torsemide 20 mg 3 tablets once a day.  11. Tramadol 50 mg every 6 hours as needed for pain.   PHYSICAL EXAMINATION:  VITAL SIGNS: Temperature 98.1, pulse of 104, blood pressure 147/88, saturating 96% on room air.  GENERAL: Moderately built Caucasian male patient lying in bed in significant distress from his leg pain.  PSYCHIATRIC: Alert and oriented x 3, anxious. HEENT: Atraumatic, normocephalic. Oral mucosa moist and pink. Extraocular muscles normal. No pallor. No icterus. Pupils bilaterally equal and reactive to light.  NECK: Supple. No thyromegaly. No palpable lymph nodes. Trachea midline. No thyromegaly or JVD.  CARDIOVASCULAR: S1, S2, without any murmurs tachycardic peripheral pulses 2+ edema of left lower extremity.  RESPIRATORY: Normal work of breathing. Clear to auscultation on both sides.  GASTROINTESTINAL: Soft abdomen, nontender, bowel sounds present, no organomegaly palpable.  SKIN: Warm and dry. Has redness extending from his foot to knee with significant warm tender, red, no open wounds found.  MUSCULOSKELETAL: Has swelling around the left ankle. Pain with movement.  VASCULAR: His pulses are palpable in the left lower extremity.  NEUROLOGICAL: Motor strength 5/5 in upper extremities. LYMPHATIC:  No cervical or inguinal lymphadenopathy.  LABORATORY STUDIES: Glucose 345, BUN 38, creatinine 1.47, sodium 129, potassium 3.7, chloride 96, GFR 52, AST, ALT, alkaline phosphatase, bilirubin normal. WBC 20.9, hemoglobin 14.4, platelets 166,000.  WBC neutrophils 92%. Urinalysis shows no bacteria.    X-ray of the left tibia and fibula shows soft tissue edema and no frank abscess. No bony destruction, osteoarthritic changes of the left ankle joint. Signs of total knee replacement. No fracture-dislocation found.   ASSESSMENT AND PLAN:  1.  Left lower extremity cellulitis, quickly worsening. The patient will be started on broad-spectrum antibiotics with vancomycin and Zosyn. He has sepsis due to the cellulitis. Will be on IV fluids.  We will also check blood cultures.  X-ray has been done. There is no gas that I see. There is no fluctuance on examination. He also has palpable distal pulses, but needs close monitoring.  2.  Uncontrolled diabetes mellitus, likely from the infection. We will put him on sliding scale insulin and continue home medication.  3.  Acute renal failure. Creatinine is worse at 1.5.  At this time, this is due to sepsis. The patient will be on IV fluids and needs to be monitored. Repeat in the morning.  4.  Hypertension. Continue medications.  5.  Deep vein thrombosis prophylaxis with Lovenox.   CODE STATUS: FULL CODE.   TIME SPENT TODAY ON THIS CASE: 55 minutes.     ____________________________ Leia Alf Josalyn Dettmann, MD srs:DT D: 12/22/2013 13:58:51 ET T: 12/22/2013 14:19:44 ET JOB#: [id number]  cc: Alveta Heimlich R. Christinamarie Tall, MD, <Dictator> Ocie Cornfield. Ouida Sills, MD Neita Carp MD ELECTRONICALLY SIGNED 12/23/2013 7:42

## 2014-04-25 NOTE — Op Note (Signed)
PATIENT NAME:  Michael Humphrey, Michael Humphrey MR#:  [id number] DATE OF BIRTH:  [birth year]  DATE OF PROCEDURE:  01/01/2011  PREOPERATIVE DIAGNOSIS: Degenerative arthrosis of the right knee.   POSTOPERATIVE DIAGNOSIS: Degenerative arthrosis of the right knee.   PROCEDURE PERFORMED: Right total knee arthroplasty using computer-assisted navigation.   SURGEON: Franne Forts, MD  ASSISTANT: Vance Peper, PA-C (required to maintain retraction throughout the procedure)   ANESTHESIA: Femoral nerve block and spinal.   ESTIMATED BLOOD LOSS: 150 mL.   FLUIDS REPLACED: 2000 mL of crystalloid.   TOURNIQUET TIME: 91 minutes.   DRAINS: Two medium drains to reinfusion system.   SOFT TISSUE RELEASES: Anterior cruciate ligament, posterior cruciate ligament, deep medial collateral ligament, patellofemoral ligament, and posterolateral corner.   IMPLANTS UTILIZED: DePuy PFC Sigma size 4 posterior stabilized femoral component (cemented), size 4 MBT tibial component (cemented), 38 mm three peg oval dome patella (cemented), and a 10 mm stabilized rotating platform polyethylene insert. gentamicin bone cement was used due to the patient's history of insulin-dependent diabetes.   INDICATIONS FOR SURGERY: The patient is a 64 year old male who has been seen for complaints of progressive right knee pain. X-rays demonstrated degenerative changes in tricompartmental fashion with relative valgus deformity. The patient had not seen any significant improvement despite conservative nonsurgical intervention. Pain had progressed to the point that it was significantly interfering with his activities of daily living. After discussion of the risks and benefits of surgical intervention, the patient expressed his understanding of the risks and benefits and agreed with plans for surgical intervention.   PROCEDURE IN DETAIL: The patient was brought into the Operating Room and, after adequate femoral nerve block and spinal anesthesia was achieved, a  tourniquet was placed on the patient's upper right thigh. The patient's right knee and leg were cleaned and prepped with alcohol and DuraPrep and draped in the usual sterile fashion. A "time out" was performed as per usual protocol. The right lower extremity was exsanguinated using an Esmarch, and the tourniquet was inflated to 300 mmHg. An anterior longitudinal incision was made followed by a standard mid vastus approach. A moderate effusion was evacuated. Deep fibers of the medial collateral ligament were elevated in subperiosteal fashion off the medial flare of the tibia so as to maintain a continuous soft tissue sleeve. The patella was subluxed laterally and the patellofemoral ligament was incised. Inspection of the knee demonstrated severe degenerative changes most notably to the medial compartment. Prominent osteophytes were debrided using a rongeur. Anterior and posterior cruciate ligaments were excised. Two 4.0 Schanz pins were inserted into the femur and into the tibia for attachment of the ray of spheres used for computer-assisted navigation. Hip center was identified using a circumduction technique. Distal landmarks, proximal tibia and distal femur, were mapped using the computer. The distal femoral cutting guide was positioned using computer-assisted navigation so as to achieve 5 degree distal valgus cut. The cut was performed and verified using the computer. The distal femur was sized and it was felt that a size 4 femoral component was appropriate. A size 4 cutting guide was positioned using computer-assisted navigation and the anterior cut was performed and verified using the computer. This was followed by completion of the posterior and chamfer cuts. Femoral cutting guide for the central box was then positioned and the central box cut was performed.   Attention was then directed to the proximal tibia. Medial and lateral menisci were excised. The extramedullary tibial cutting guide was positioned  using computer-assisted navigation so as  to achieve 0 degree varus valgus alignment and 0 degree posterior slope. The cut was performed and verified using the computer. The proximal tibia was sized and it was felt that a size 4 tibial tray was appropriate. Tibial and femoral trials were inserted followed by insertion of a 10 mm polyethylene trial. The knee was felt to be tight laterally. Trial components were removed. The knee was placed in full extension and distracted using Moreland retractors. The posterolateral corner was palpated and noted be tight. The posterolateral corner was carefully released using a combination of electrocautery and Metzenbaum scissors. Trial components were replaced followed by insertion of a 10 mm polyethylene insert. Excellent mediolateral soft tissue balancing was appreciated both in full extension and in 90 degrees of flexion. Finally, the patella was cut and prepared so as to accommodate a 38 mm three peg oval dome patella. Patellar trial was placed and the knee was placed through range of motion with excellent patellar tracking appreciated.   The femoral trial was removed. Central post hole for the tibial component was reamed followed by insertion of a keel punch. The tibial tray was then removed. The cut surfaces of bone were irrigated with copious amounts of normal saline with antibiotic solution using pulsatile lavage and then suctioned dry. Polymethyl methacrylate cement with gentamicin was mixed in the usual fashion using a vacuum mixer. Cement was applied to the cut surface of the proximal tibia as well as along the undersurface of a size 4 MBT tibial component. The tibial component was positioned and impacted into place. Excess cement was removed using freer elevators. Cement was then applied to the cut surface of the femur as well as along the posterior flanges of a size 4 posterior stabilized femoral component. Femoral component was positioned and impacted into place.  Excess cement was removed using freer elevators. A 10 mm polyethylene trial was inserted and the knee was brought into full extension with steady axial compression applied. Finally, cement was applied to the backside of a 38 mm three peg oval dome patella and the patellar component was positioned and patellar clamp applied. Excess cement was removed using freer elevators.   After adequate curing of the cement, the tourniquet was deflated after total tourniquet time of 91 minutes. Hemostasis was achieved using electrocautery. The knee was irrigated with copious amounts of normal saline with antibiotic solution using pulsatile lavage and then suctioned dry. The knee was inspected for any residual cement debris. 30 mL of 0.25% Marcaine with epinephrine was injected along the posterior capsule. A 10 mm stabilized rotating platform polyethylene insert was inserted and the knee was placed through a range of motion. Excellent patellar tracking was appreciated and excellent mediolateral soft tissue balancing was appreciated both in full extension and in 90 degrees of flexion.   Two medium drains were placed in the wound bed and brought out through a separate stab incision to be attached to a reinfusion system. The medial parapatellar portion of the incision was reapproximated using interrupted sutures of #1 Vicryl. The subcutaneous tissue was approximated in layers using first #0 Vicryl followed by #2-0 Vicryl. Skin was closed with skin staples. A sterile dressing was applied.           The patient tolerated the procedure well. He was transported to the Recovery Room in stable condition. ____________________________ Laurice Record. Holley Bouche., MD jph:slb D: 01/01/2011 11:08:14 ET T: 01/01/2011 11:51:00 ET JOB#: [id number]  cc: Jeneen Rinks P. Holley Bouche., MD, <Dictator> Nello P Holley Bouche  MD ELECTRONICALLY SIGNED 01/02/2011 20:26

## 2014-04-25 NOTE — Discharge Summary (Signed)
PATIENT NAME:  Michael Humphrey, Michael Humphrey MR#:  [id number] DATE OF BIRTH:  [birth year]  DATE OF ADMISSION:  01/01/2011 DATE OF DISCHARGE:  01/05/2011  ADMITTING DIAGNOSIS: Degenerative arthrosis of the right knee.   DISCHARGE DIAGNOSIS: Degenerative arthrosis of the right knee.   HISTORY: The patient is a 64 year old who has been followed at [facility] for progression of right knee pain. The patient had previously underwent a right knee arthroscopy in January 2012 and at that time was noted to have significant degenerative changes especially along the medial compartment space. He noticed continued progression of swelling of the knee with occasional activity. He had localized most of the pain along the medial aspect of the knee. He denied any locking or giving way of the knee. He had not seen any improvement in his condition despite activity modification, weight loss, and anti-inflammatory medications. X-rays taken in the clinic showed narrowing of the medial cartilage space with associated varus alignment. He was noted to have osteophyte formation and subchondral sclerosis. After a discussion of the risks and benefits of surgical intervention, the patient expressed his understanding of the risks and benefits and agreed with plans for surgical intervention.   PROCEDURE: Right total knee arthroplasty using computer-assisted navigation.   ANESTHESIA: Femoral nerve block with spinal.   SOFT TISSUE RELEASE: Anterior cruciate ligament, posterior cruciate ligament, deep and medial collateral ligaments as well as the patellofemoral ligament and the posterior lateral formalin.   IMPLANTS UTILIZED: DePuy PFC Sigma size 4 posterior stabilized femoral component (cemented), size 4 MBT tibial component (cemented), 38 mm three pegged oval dome patella (cemented), and a 10 mm stabilized rotating platform polyethylene insert. Gentamicin bone cement was used due to the patient's history of insulin dependent diabetes.    HOSPITAL COURSE: The patient tolerated the procedure very well. He had no complications. He was then taken to the PAC-U where he was stabilized and then transferred to the orthopedic floor. He began receiving anticoagulation therapy of Lovenox 30 mg subcutaneous every 12 hours per anesthesia protocol. He was fitted with TED stockings bilaterally. These were allowed to be removed one hour per eight hour shift. The right one was applied on day two following removal of the Hemovac and dressing change. He was fitted with AVI compression foot pumps bilaterally set at 130 mmHg. His calves have been nontender and free of any evidence of any deep venous thromboses. Heels were elevated off the bed using rolled towels. Heels were nontender with no tissue breakdown noted.   The patient has denied any chest pain or shortness of breath. Vital signs have been stable. He has been afebrile. Hemodynamically he was stable and no transfusions were given other than the Autovac transfusions given the first six hours postoperatively.   The patient began receiving physical therapy on day one for gait training and transfers. This was initially was extremely slow because of pain and deconditioning, however, upon being discharged he was ambulating greater than 200 feet. He was independent with bed to chair transfers. He was able to go up and down four sets of steps. Occupational therapy was also initiated on day one for activities of daily living and assistive devices.   The patient's IV, Hemovac, and Foley were all discontinued on day two along with a dressing change. The wound was free of any drainage or signs of infection. The Polar Care was reapplied to the surgical leg maintaining a temperature of 40 to 50 degrees Fahrenheit.   DISPOSITION: The patient is being discharged to  home in improved stable condition.   DISCHARGE INSTRUCTIONS: 1. He will continue with PT for home health physical therapy. He is to weight bear as  tolerated. He is to continue using a Oscar until cleared by physical therapy to go to a quad cane.  2. He is to continue with his TED stockings. These are allowed to be worn during the day but take off at night.  3. Continue using the Polar Care maintaining a temperature of 40 to 50 degrees Fahrenheit.  4. Home physical therapy will change the dressing as needed.  5. He was given a prescription for oxycodone 5 to 10 mg every 4 to 6 hours p.r.n. for pain as well as tramadol 1 to 2 tablets every 4 to 6 hours p.r.n. for pain. A prescription for Lovenox 40 mg subcutaneously daily was given. He is to take this for days and then discontinue and begin taking one 81 mg enteric-coated aspirin per day.  6. He has a follow-up appointment in the clinic in two weeks.  7. He is to call the clinic sooner for any temperatures of 101.5 or greater or excessive bleeding.  8. He is placed on an ADA diet.    PAST MEDICAL HISTORY:  1. Diabetes. 2. Hypertension. 3. Hyperlipidemia. 4. Diverticulitis. ____________________________ Vance Peper, PA jrw:slb D: 01/05/2011 08:17:00 ET T: 01/08/2011 09:30:42 ET JOB#: [id number]  cc: Vance Peper, PA, <Dictator> Chakara Bognar PA ELECTRONICALLY SIGNED 01/09/2011 7:43

## 2014-04-26 LAB — SURGICAL PATHOLOGY

## 2014-04-28 NOTE — Consult Note (Signed)
PATIENT NAME:  Michael Humphrey, Michael Humphrey MR#:  [id number] DATE OF BIRTH:  06/07/1950  DATE OF CONSULTATION:  12/28/2013  REFERRING PHYSICIAN:  Ocie Cornfield. Ouida Sills, MD and Gerrit Heck. Troxler, DPM CONSULTING PHYSICIAN:  Cheral Marker. Ola Spurr, MD  REASON FOR CONSULTATION: Group C streptococcus septic arthritis in the ankle.   HISTORY OF PRESENT ILLNESS: This is a very pleasant 64 year old gentleman with history of diabetes, hypertension and hyperlipidemia who was admitted December 22 with left lower extremity redness, pain and swelling. This occurred rather acutely. He did have a minor injury where he was falling and used his ankle to catch himself. He did not have any breaks in the skin. He had severe pain. The patient was admitted and seen by podiatry who felt he likely had a septic joint. He was initially treated with levofloxacin and steroid course. He had aspiration at the bedside of his ankle, which showed gram-positive cocci. On December 23, he underwent a wash out of the feet ankle joint and the subtalar joint. There was evidence of some purulence at that time. The patient has been on antibiotics since then. However, on the 26th he had aspiration of fluid that continued to show gram-positive cocci. He had a follow up surgery December 26 with open arthrotomy to the left ankle and left subpatellar joints. This was lavaged copiously. He has grown group C streptococcus from multiple cultures. He has been on antibiotics and has had a PICC line placed. We are consulted for further antibiotic management.   The patient reports he is clinically improving with much decreased redness and swelling of his leg, however, he still has marked pain. He has been tolerating the antibiotics.   PAST MEDICAL HISTORY:  1.  Diabetes.  2.  Hypertension.  3.  Hyperlipidemia.  4.  Osteoarthritis.  5.  Spinal stenosis.  6.  Cholecystectomy.   PAST SURGICAL HISTORY: Bilateral knee replacements and bilateral hip replacements. He has  also had bilateral foot surgeries.   FAMILY HISTORY: Positive for hypertension and diabetes.   SOCIAL HISTORY: Lives in [place]. Quit tobacco many years ago, drinks alcohol occasionally. 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   ALLERGIES: HE IS ALLERGIC TO CLONIDINE, DICLOFENAC, FIORICET, HYDROCODONE, MOBIC, NORVASC AND PERCOCET.   REVIEW OF SYSTEMS: Eleven systems reviewed and negative except as per HPI.   ANTIBIOTICS SINCE ADMISSION: Include ceftriaxone begun December 24 through the 27th. Zosyn begun December 21 through the 24th. Vancomycin given December 21 through the 23rd.   PHYSICAL EXAMINATION:  VITAL SIGNS: Temperature 98.3, pulse 97, blood pressure 154/99, respirations 20, saturation 96% on room air.  GENERAL: He is somewhat disheveled, lying in bed, no acute distress. HEENT: Pupils equal, round, reactive to light and accommodation. Extraocular movements are intact. Sclerae are anicteric. Oropharynx is clear.  NECK: Supple.  HEART: Regular.  LUNGS: Clear.  ABDOMEN: Soft, nontender, nondistended.  EXTREMITIES: Left lower extremity is wrapped postoperatively. He has drains in place. He has some residual erythema, but minimal edema.  NEUROLOGIC: He is alert and oriented x 3. Grossly nonfocal neurological examination.   DATA: Blood cultures December 22 x 2 are no growth. Fluid culture December 22 grew heavy growth group C streptococcus sensitive to ceftriaxone, vancomycin, levofloxacin and penicillin. Clostridium difficile negative December 26. Follow up culture of the joint December 26 grew moderate growth group C streptococcus. Fluid analysis from December 22 showed no crystals but 211,000 white cells with 83% PMNs.   White blood count on December 22 was 20,000, currently  is 12.6, hemoglobin 12.12, platelets 301,000. Urinalysis was negative. Renal function shows creatinine of 0.92 on admission, it is 1.47. LFTs currently show albumin 1.7, total protein 5.8, alkaline  phosphatase 139, ALT 89, AST 32.   IMAGING: Tibia-fibula x-ray December 22 showed soft tissue edema and arthritic changes of the left ankle joint. Ultrasound left lower extremity showed no evidence of DVT. Abdomen showed no acute disease. Chest x-ray December 27 showed PICC line placement.   IMPRESSION: A 64 year old gentleman with diabetes admitted with severe cellulitis and septic arthritis of the left ankle. He is growing group C streptococcus from multiple sources in the ankle. He has clinically responded to 2 washouts, most recently being on December 26. He has a PICC line placed.   RECOMMENDATIONS:  1.  Would recommend a 6 week course of IV antibiotics from the second surgery on December 26. This would best be done and easiest with ceftriaxone 2 grams q. 24 hours. This can be done at home or at rehabilitation. He will need a weekly CBC and CMET to evaluate for any evidence of side effects. I would recommend that we check a CRP and sedimentation rate for baseline.  2.  I can see the patient in 2-3 weeks for followup and to adjust his antibiotics as needed.   Thank you for the consult. I will be glad to follow with you.    ____________________________ Cheral Marker. Ola Spurr, MD dpf:TT D: 12/28/2013 16:01:52 ET T: 12/28/2013 17:02:38 ET JOB#: [id number]  cc: Cheral Marker. Ola Spurr, MD, <Dictator> Raylon Lamson Ola Spurr MD ELECTRONICALLY SIGNED 01/03/2014 21:27

## 2014-04-28 NOTE — Op Note (Signed)
PATIENT NAME:  Michael Humphrey, Michael Humphrey MR#:  [id number] DATE OF BIRTH:  01/07/1950  DATE OF PROCEDURE:  12/26/2013  PREOPERATIVE DIAGNOSIS: Septic left ankle joint and left subtalar joint.   POSTOPERATIVE DIAGNOSIS: Septic left ankle joint and left subtalar joint.   PROCEDURE PERFORMED: Open arthrotomy to the left ankle and left subtalar joint with irrigation.   SURGEON: Perry Mount, DPM.   ASSISTANT: None.   HISTORY OF PRESENT ILLNESS: The patient was admitted to the hospital on Tuesday with a swollen, inflamed left ankle, resultant aspirations yielded a heavy growth of group C streptococcus infection within the joints. At this point, we scheduled him for an open arthrotomy on Wednesday, which was accomplished Wednesday. During that procedure, there was also aspiration to the subtalar joint, which yielded considerable purulence, as well. The patient was brought in today for his second irrigation of the joints involved.   ANESTHESIA: Spinal.   ANESTHESIOLOGIST: Dr. Marcello Moores.  ESTIMATED BLOOD LOSS: Less than 50 mL.   OPERATIVE REPORT: The patient was brought to the OR and placed on the OR table in the supine position. After spinal anesthesia was achieved, the patient was then prepped and draped in the usual sterile manner. At this point, an aspiration was done to the ankle joint. This was sent to the laboratory for a stat Gram-stain, which came back with minimal gram-positive cocci and a few gram-negative rods. There were multiple white blood cells. At this point, I elected to go ahead and do an open arthrotomy again to irrigate and flushed out these joints once again. Both of the areas were then opened up and joints were exposed. Sutures were removed. The pulse lavage system was used on both the ankle joint and the subtalar joint to copiously irrigate and suction the joint systems. Once this was adequately accomplished, the area was then further irrigated and suctioned with general regular suction  technique. At this point, 3-0 Vicryl was used to suture the ankle joint capsule and deep superficial fascia layers back together in a continuous stitch. The skin was closed with 4-0 Vicryl in a subcuticular stitch. The subtalar joint was closed in a similar fashion. The patient was then dressed with a sterile compressive dressing. Prior to closure, a 10 TLS was put into the ankle joint and a #7 TLS was put into the subtalar joint. The dressings were applied to the left foot and leg. The patient tolerated the procedure and anesthesia well and left the OR for the recovery room with vital signs stable and neurovascular status intact.    ____________________________ Gerrit Heck. Sarvesh Meddaugh, DPM mgt:mw D: 12/26/2013 13:48:05 ET T: 12/26/2013 13:56:10 ET JOB#: [id number]  cc: Gerrit Heck Kailon Treese, DPM, <Dictator> Perry Mount MD ELECTRONICALLY SIGNED 01/14/2014 12:56

## 2014-05-02 NOTE — Op Note (Signed)
PATIENT NAME:  Michael Humphrey, Michael Humphrey MR#:  [id number] DATE OF BIRTH:  Apr 28, 1950  DATE OF PROCEDURE:  12/23/2013  PREOPERATIVE DIAGNOSIS: Septic ankle joint, left.   POSTOPERATIVE DIAGNOSES:   1.  Septic ankle joint, left.  2. Septic subtalar joint, left.     SURGEON: Perry Mount, DPM.   ASSISTANT: None.   HISTORY OF PRESENT ILLNESS: The patient presented Monday to the office with some redness and swelling around the ankle. Differential was gallop versus infection.  We started him on Levaquin as well as tried to reduce his inflammation with intramuscular steroid injection. The next day he was worse and I instructed him to come to the Emergency Room where he was admitted. Aspiration last night at bedside of the ankle yielded pus which showed a gram-positive cocci on Gram stain. During the operation today the subtalar joint was also aspirated and purulent drainage was noted.   ANESTHESIA:  Spinal anesthesiologist, Dr. Boston Service.  ESTIMATED BLOOD LOSS:  40 mL (Dictation Anomaly)<<M.ISSING TEXT>> .   DESCRIPTION OF PROCEDURE: The patient was brought to the OR and placed on the OR table in the supine position. At this point, after sedation was achieved and spinal anesthesia was achieved by the anesthesia team, the patient was then prepped and draped in the usual sterile manner. At this time the attention was directed to the anterior medial portion of the ankle joint medial to the tibialis anterior tendon where a 6 cm linear incision was made. This was deepened with sharp and blunt dissection. The ligamentous structures were freed as well as the anterior ankle joint capsule was incised longitudinally. At this point there was some purulence noted to drain from the area, not as severe as it was last night. At this time I also aspirated the subtalar joint and noted some purulent drainage from this region. Subsequently, I elected to do an arthrotomy on this subtalar joint laterally at the lateral sinus  tarsi as well. A 4 cm linear incision was made over the sinus tarsi deepened with sharp and blunt dissection. Bleeders were clamped and bovied as required. The capsular tissue overlying the sinus tarsi was incised and purulent drainage was noted. At this point, a combination of significant pulse lavage was used to both incision margins. The areas were copiously irrigated with sterile saline until all tissues were clear. Expression and compression around the areas yielded no further purulent drainage or pus from the area at that time frame. Once I was satisfied with no further drainage from the regions, a Jackson-Pratt drain size 10 was placed in the ankle joint and a TLS drain size 7 was placed in the subtalar joint. The capsular tissue to both areas was then closed with 4-0 Vicryl in a continuous stitch as were deep and superficial fascial layers. The skin was closed with 4-0 nylon horizontal mattress sutures. A sterile compressive dressing was placed across the wounds consisting of 4 x 4's, ABD pads, Kling and Kerlix, and an Ace wrap. The drains were seen to be working. They were sutured in place as well as Steri-Stripped down. The patient appeared to tolerate the procedure and anesthesia well and left the OR for the recovery room. Vital signs stable and neurovascular status intact.    ____________________________ Gerrit Heck. Sintia Mckissic, DPM mgt:at D: 12/23/2013 09:06:59 ET T: 12/23/2013 12:52:31 ET JOB#: [id number]  cc: Gerrit Heck Rajanae Mantia, DPM, <Dictator> Perry Mount MD ELECTRONICALLY SIGNED 01/14/2014 12:56

## 2014-12-16 ENCOUNTER — Encounter: Payer: Self-pay | Admitting: *Deleted

## 2014-12-17 ENCOUNTER — Encounter
Admission: RE | Disposition: A | Discharge status: Home / Self Care | Payer: Self-pay | Source: Ambulatory Visit | Attending: Gastroenterology

## 2014-12-17 ENCOUNTER — Ambulatory Visit: Payer: BC Managed Care – PPO | Admitting: Anesthesiology

## 2014-12-17 ENCOUNTER — Ambulatory Visit
Admission: RE | Admit: 2014-12-17 | Discharge: 2014-12-17 | Disposition: A | Discharge status: Home / Self Care | Payer: BC Managed Care – PPO | Source: Ambulatory Visit | Attending: Gastroenterology | Admitting: Gastroenterology

## 2014-12-17 ENCOUNTER — Encounter: Payer: Self-pay | Admitting: Anesthesiology

## 2014-12-17 DIAGNOSIS — E7800 Pure hypercholesterolemia, unspecified: Secondary | ICD-10-CM | POA: Diagnosis not present

## 2014-12-17 DIAGNOSIS — K5730 Diverticulosis of large intestine without perforation or abscess without bleeding: Secondary | ICD-10-CM | POA: Diagnosis not present

## 2014-12-17 DIAGNOSIS — I10 Essential (primary) hypertension: Secondary | ICD-10-CM | POA: Diagnosis not present

## 2014-12-17 DIAGNOSIS — Z885 Allergy status to narcotic agent status: Secondary | ICD-10-CM | POA: Insufficient documentation

## 2014-12-17 DIAGNOSIS — K625 Hemorrhage of anus and rectum: Secondary | ICD-10-CM | POA: Diagnosis present

## 2014-12-17 DIAGNOSIS — Z79899 Other long term (current) drug therapy: Secondary | ICD-10-CM | POA: Insufficient documentation

## 2014-12-17 DIAGNOSIS — Z888 Allergy status to other drugs, medicaments and biological substances status: Secondary | ICD-10-CM | POA: Insufficient documentation

## 2014-12-17 DIAGNOSIS — D123 Benign neoplasm of transverse colon: Secondary | ICD-10-CM | POA: Insufficient documentation

## 2014-12-17 DIAGNOSIS — E119 Type 2 diabetes mellitus without complications: Secondary | ICD-10-CM | POA: Insufficient documentation

## 2014-12-17 DIAGNOSIS — M1990 Unspecified osteoarthritis, unspecified site: Secondary | ICD-10-CM | POA: Insufficient documentation

## 2014-12-17 HISTORY — DX: Unspecified osteoarthritis, unspecified site: M19.90

## 2014-12-17 HISTORY — PX: COLONOSCOPY WITH PROPOFOL: SHX5780

## 2014-12-17 HISTORY — DX: Essential (primary) hypertension: I10

## 2014-12-17 HISTORY — DX: Pure hypercholesterolemia, unspecified: E78.00

## 2014-12-17 HISTORY — DX: Type 2 diabetes mellitus without complications: E11.9

## 2014-12-17 LAB — GLUCOSE, CAPILLARY: Glucose-Capillary: 202 mg/dL — ABNORMAL HIGH (ref 65–99)

## 2014-12-17 SURGERY — COLONOSCOPY WITH PROPOFOL
Anesthesia: General

## 2014-12-17 MED ORDER — SODIUM CHLORIDE 0.9 % IV SOLN: INTRAVENOUS | Status: DC

## 2014-12-17 MED ORDER — LIDOCAINE HCL (CARDIAC) 20 MG/ML IV SOLN
INTRAVENOUS | Status: DC | PRN
  Administered 2014-12-17: 60 mg via INTRAVENOUS

## 2014-12-17 MED ORDER — SODIUM CHLORIDE 0.9 % IV SOLN
INTRAVENOUS | Status: DC
  Administered 2014-12-17: 1000 mL via INTRAVENOUS

## 2014-12-17 MED ORDER — PROPOFOL 10 MG/ML IV BOLUS
INTRAVENOUS | Status: DC | PRN
  Administered 2014-12-17: 40 mg via INTRAVENOUS

## 2014-12-17 MED ORDER — PROPOFOL 500 MG/50ML IV EMUL
INTRAVENOUS | Status: DC | PRN
  Administered 2014-12-17: 130 ug/kg/min via INTRAVENOUS

## 2014-12-17 MED ORDER — SODIUM CHLORIDE 0.9 % IV SOLN
1.0000 g | Freq: Once | INTRAVENOUS | Status: AC
  Administered 2014-12-17: 1 g via INTRAVENOUS

## 2014-12-17 NOTE — Anesthesia Preprocedure Evaluation (Signed)
Anesthesia Evaluation  Patient identified by MRN, date of birth, ID band Patient awake    Reviewed: Allergy & Precautions, NPO status , Patient's Chart, lab work & pertinent test results  Airway Mallampati: II  TM Distance: >3 FB Neck ROM: Limited   Comment: Large neck Dental  (+) Upper Dentures, Lower Dentures   Pulmonary neg pulmonary ROS, sleep apnea ,  Does not wear CPAP, although he was told that he should wear one.   Pulmonary exam normal breath sounds clear to auscultation       Cardiovascular hypertension, Pt. on medications Normal cardiovascular exam     Neuro/Psych negative neurological ROS  negative psych ROS   GI/Hepatic negative GI ROS, Neg liver ROS,   Endo/Other  diabetes, Well Controlled, Type 2, Oral Hypoglycemic Agents  Renal/GU negative Renal ROS  negative genitourinary   Musculoskeletal  (+) Arthritis , Osteoarthritis,    Abdominal Normal abdominal exam  (+)   Peds negative pediatric ROS (+)  Hematology negative hematology ROS (+)   Anesthesia Other Findings   Reproductive/Obstetrics                             Anesthesia Physical Anesthesia Plan  ASA: II  Anesthesia Plan: General   Post-op Pain Management:    Induction: Intravenous  Airway Management Planned: Nasal Cannula  Additional Equipment:   Intra-op Plan:   Post-operative Plan:   Informed Consent: I have reviewed the patients History and Physical, chart, labs and discussed the procedure including the risks, benefits and alternatives for the proposed anesthesia with the patient or authorized representative who has indicated his/her understanding and acceptance.   Dental advisory given  Plan Discussed with: CRNA and Surgeon  Anesthesia Plan Comments:         Anesthesia Quick Evaluation

## 2014-12-17 NOTE — Transfer of Care (Signed)
Immediate Anesthesia Transfer of Care Note  Patient: Michael Humphrey  Procedure(s) Performed: Procedure(s): COLONOSCOPY WITH PROPOFOL (N/A)  Patient Location: Endoscopy Unit  Anesthesia Type:General  Level of Consciousness: awake, alert , oriented and patient cooperative  Airway & Oxygen Therapy: Patient Spontanous Breathing and Patient connected to nasal cannula oxygen  Post-op Assessment: Report given to RN, Post -op Vital signs reviewed and stable and Patient moving all extremities X 4  Post vital signs: Reviewed and stable  Last Vitals:  Filed Vitals:   12/17/14 0852  BP: 126/92  Pulse: 96  Temp: 35.8 C  Resp: 16    Complications: No apparent anesthesia complications

## 2014-12-17 NOTE — H&P (Signed)
Outpatient short stay form Pre-procedure 12/17/2014 10:22 AM Michael Sails MD  Primary Physician: Dr. Frazier Richards  Reason for visit:  Colonoscopy  History of present illness:  Rectal bleeding. Patient is a 64 year old male is anything today for endoscopy in regards to history of rectal bleeding. His last colonoscopy was in 2009. There were some hyperplastic polyps. Takes no blood thinning products or aspirin. He did have a knee replacement done in March 2015 requiring antibiotics today.    Current facility-administered medications:  .  0.9 %  sodium chloride infusion, , Intravenous, Continuous, Michael Sails, MD, Last Rate: 20 mL/hr at 12/17/14 0911, 1,000 mL at 12/17/14 0911 .  0.9 %  sodium chloride infusion, , Intravenous, Continuous, Michael Sails, MD  Prescriptions prior to admission  Medication Sig Dispense Refill Last Dose  . glipiZIDE-metformin (METAGLIP) 5-500 MG tablet Take 1 tablet by mouth 2 (two) times daily before a meal.   12/16/2014 at Unknown time  . lisinopril (PRINIVIL,ZESTRIL) 2.5 MG tablet Take 2.5 mg by mouth daily.   12/16/2014 at Unknown time  . sildenafil (REVATIO) 20 MG tablet Take 20 mg by mouth 3 (three) times daily.   12/16/2014 at Unknown time  . spironolactone (ALDACTONE) 25 MG tablet Take 25 mg by mouth daily.   12/16/2014 at Unknown time  . torsemide (DEMADEX) 20 MG tablet Take 20 mg by mouth daily.   12/16/2014 at Unknown time  . traMADol (ULTRAM) 50 MG tablet Take 50 mg by mouth every 6 (six) hours as needed.   12/16/2014 at Unknown time     Allergies  Allergen Reactions  . Clonidine Derivatives   . Fioricet [Butalbital-Apap-Caffeine]   . Hydrocodone-Acetaminophen   . Mobic [Meloxicam]   . Norvasc [Amlodipine Besylate]   . Statins      Past Medical History  Diagnosis Date  . Arthritis   . Diabetes mellitus without complication ([place])   . High cholesterol   . Hypertension     Review of systems:      Physical Exam   Heart and lungs: Regular rate and rhythm without rub or gallop, lungs are bilaterally clear.    HEENT: Norm cephalic atraumatic eyes are anicteric.    Other:     Pertinant exam for procedure: Soft protuberant there is some mild tenderness at a old incision site right side of the abdomen (old gallbladder surgery). No masses or rebound. Bowel sounds positive normoactive.    Planned proceedures: Colonoscopy and indicated procedures. I have discussed the risks benefits and complications of procedures to include not limited to bleeding, infection, perforation and the risk of sedation and the patient wishes to proceed.    Michael Sails, MD Gastroenterology 12/17/2014  10:22 AM

## 2014-12-17 NOTE — Op Note (Signed)
[facility] Gastroenterology Patient Name: Michael Humphrey Procedure Date: 12/17/2014 10:27 AM MRN: [id number] Account #: [id number] Date of Birth: 03-08-1950 Admit Type: Outpatient Age: 64 Room: [facility] ENDO ROOM 3 Gender: Male Note Status: Finalized Procedure:         Colonoscopy Indications:       Rectal bleeding Providers:         Lollie Sails, MD Referring MD:      Ocie Cornfield. Ouida Sills, MD (Referring MD) Medicines:         Monitored Anesthesia Care Complications:     No immediate complications. Procedure:         Pre-Anesthesia Assessment:                    - ASA Grade Assessment: III - A patient with severe                     systemic disease.                    After obtaining informed consent, the colonoscope was                     passed under direct vision. Throughout the procedure, the                     patient's blood pressure, pulse, and oxygen saturations                     were monitored continuously. The Colonoscope was                     introduced through the anus and advanced to the the cecum,                     identified by appendiceal orifice and ileocecal valve. The                     colonoscopy was performed without difficulty. The patient                     tolerated the procedure well. The quality of the bowel                     preparation was fair. Findings:      Multiple small-mouthed diverticula were found in the sigmoid colon and       in the distal descending colon.      A 2 mm polyp was found at the ileocecal valve. The polyp was sessile.       The polyp was removed with a cold biopsy forceps. Resection and       retrieval were complete.      The retroflexed view of the distal rectum and anal verge was normal and       showed no anal or rectal abnormalities.      The retroflexed view of the distal rectum and anal verge was normal and       showed no anal or rectal abnormalities. Impression:        - Diverticulosis  in the sigmoid colon and in the distal                     descending colon.                    -  One 2 mm polyp at the ileocecal valve. Resected and                     retrieved.                    - The distal rectum and anal verge are normal on                     retroflexion view. Recommendation:    - Discharge patient to home.                    - Use Citrucel one tablespoon PO daily daily. Procedure Code(s): --- Professional ---                    [phone], Colonoscopy, flexible; with biopsy, single or                     multiple Diagnosis Code(s): --- Professional ---                    D12.0, Benign neoplasm of cecum                    K62.5, Hemorrhage of anus and rectum                    K57.30, Diverticulosis of large intestine without                     perforation or abscess without bleeding CPT copyright 2014 American Medical Association. All rights reserved. The codes documented in this report are preliminary and upon coder review may  be revised to meet current compliance requirements. Lollie Sails, MD 12/17/2014 11:03:36 AM This report has been signed electronically. Number of Addenda: 0 Note Initiated On: 12/17/2014 10:27 AM Scope Withdrawal Time: 0 hours 11 minutes 48 seconds  Total Procedure Duration: 0 hours 21 minutes 48 seconds       [facility]

## 2014-12-17 NOTE — Anesthesia Postprocedure Evaluation (Signed)
Anesthesia Post Note  Patient: Michael Humphrey  Procedure(s) Performed: Procedure(s) (LRB): COLONOSCOPY WITH PROPOFOL (N/A)  Patient location during evaluation: Endoscopy Anesthesia Type: General Level of consciousness: awake and alert and oriented Pain management: pain level controlled Vital Signs Assessment: post-procedure vital signs reviewed and stable Respiratory status: spontaneous breathing Cardiovascular status: blood pressure returned to baseline Anesthetic complications: no    Last Vitals:  Filed Vitals:   12/17/14 1126 12/17/14 1136  BP: 120/84 127/96  Pulse: 77 76  Temp:    Resp: 13 12    Last Pain:  Filed Vitals:   12/17/14 1137  PainSc: Asleep                 Talisha Erby

## 2014-12-18 ENCOUNTER — Encounter: Payer: Self-pay | Admitting: Gastroenterology

## 2014-12-20 LAB — SURGICAL PATHOLOGY

## 2015-09-20 DIAGNOSIS — L239 Allergic contact dermatitis, unspecified cause: Secondary | ICD-10-CM | POA: Diagnosis not present

## 2015-11-08 DIAGNOSIS — M25561 Pain in right knee: Secondary | ICD-10-CM | POA: Diagnosis not present

## 2015-11-11 DIAGNOSIS — J029 Acute pharyngitis, unspecified: Secondary | ICD-10-CM | POA: Diagnosis not present

## 2015-11-29 DIAGNOSIS — M5416 Radiculopathy, lumbar region: Secondary | ICD-10-CM | POA: Diagnosis not present

## 2015-11-29 DIAGNOSIS — Z96643 Presence of artificial hip joint, bilateral: Secondary | ICD-10-CM | POA: Diagnosis not present

## 2015-11-29 DIAGNOSIS — Z96653 Presence of artificial knee joint, bilateral: Secondary | ICD-10-CM | POA: Diagnosis not present

## 2015-12-21 DIAGNOSIS — E7800 Pure hypercholesterolemia, unspecified: Secondary | ICD-10-CM | POA: Diagnosis not present

## 2015-12-21 DIAGNOSIS — I129 Hypertensive chronic kidney disease with stage 1 through stage 4 chronic kidney disease, or unspecified chronic kidney disease: Secondary | ICD-10-CM | POA: Diagnosis not present

## 2015-12-21 DIAGNOSIS — E1122 Type 2 diabetes mellitus with diabetic chronic kidney disease: Secondary | ICD-10-CM | POA: Diagnosis not present

## 2015-12-21 DIAGNOSIS — N183 Chronic kidney disease, stage 3 (moderate): Secondary | ICD-10-CM | POA: Diagnosis not present

## 2015-12-22 DIAGNOSIS — M5416 Radiculopathy, lumbar region: Secondary | ICD-10-CM | POA: Diagnosis not present

## 2015-12-22 DIAGNOSIS — M48062 Spinal stenosis, lumbar region with neurogenic claudication: Secondary | ICD-10-CM | POA: Diagnosis not present

## 2015-12-22 DIAGNOSIS — M5136 Other intervertebral disc degeneration, lumbar region: Secondary | ICD-10-CM | POA: Diagnosis not present

## 2015-12-28 DIAGNOSIS — N183 Chronic kidney disease, stage 3 (moderate): Secondary | ICD-10-CM | POA: Diagnosis not present

## 2015-12-28 DIAGNOSIS — I10 Essential (primary) hypertension: Secondary | ICD-10-CM | POA: Diagnosis not present

## 2015-12-28 DIAGNOSIS — E1122 Type 2 diabetes mellitus with diabetic chronic kidney disease: Secondary | ICD-10-CM | POA: Diagnosis not present

## 2015-12-28 DIAGNOSIS — Z6835 Body mass index (BMI) 35.0-35.9, adult: Secondary | ICD-10-CM | POA: Diagnosis not present

## 2015-12-28 DIAGNOSIS — G4733 Obstructive sleep apnea (adult) (pediatric): Secondary | ICD-10-CM | POA: Diagnosis not present

## 2015-12-28 DIAGNOSIS — I129 Hypertensive chronic kidney disease with stage 1 through stage 4 chronic kidney disease, or unspecified chronic kidney disease: Secondary | ICD-10-CM | POA: Diagnosis not present

## 2015-12-28 DIAGNOSIS — E7800 Pure hypercholesterolemia, unspecified: Secondary | ICD-10-CM | POA: Diagnosis not present

## 2015-12-28 DIAGNOSIS — Z0000 Encounter for general adult medical examination without abnormal findings: Secondary | ICD-10-CM | POA: Diagnosis not present

## 2016-02-09 DIAGNOSIS — M153 Secondary multiple arthritis: Secondary | ICD-10-CM | POA: Diagnosis not present

## 2016-02-09 DIAGNOSIS — M5416 Radiculopathy, lumbar region: Secondary | ICD-10-CM | POA: Diagnosis not present

## 2016-02-13 ENCOUNTER — Other Ambulatory Visit: Payer: Self-pay | Admitting: Orthopedic Surgery

## 2016-02-13 DIAGNOSIS — M5416 Radiculopathy, lumbar region: Secondary | ICD-10-CM

## 2016-02-22 ENCOUNTER — Ambulatory Visit: Admission: RE | Admit: 2016-02-22 | Payer: PPO | Source: Ambulatory Visit

## 2016-02-23 DIAGNOSIS — M5136 Other intervertebral disc degeneration, lumbar region: Secondary | ICD-10-CM | POA: Diagnosis not present

## 2016-02-23 DIAGNOSIS — E1122 Type 2 diabetes mellitus with diabetic chronic kidney disease: Secondary | ICD-10-CM | POA: Diagnosis not present

## 2016-02-23 DIAGNOSIS — M79642 Pain in left hand: Secondary | ICD-10-CM | POA: Diagnosis not present

## 2016-02-23 DIAGNOSIS — M2550 Pain in unspecified joint: Secondary | ICD-10-CM | POA: Diagnosis not present

## 2016-02-23 DIAGNOSIS — G8929 Other chronic pain: Secondary | ICD-10-CM | POA: Diagnosis not present

## 2016-02-23 DIAGNOSIS — I129 Hypertensive chronic kidney disease with stage 1 through stage 4 chronic kidney disease, or unspecified chronic kidney disease: Secondary | ICD-10-CM | POA: Diagnosis not present

## 2016-02-23 DIAGNOSIS — M150 Primary generalized (osteo)arthritis: Secondary | ICD-10-CM | POA: Diagnosis not present

## 2016-02-23 DIAGNOSIS — N183 Chronic kidney disease, stage 3 (moderate): Secondary | ICD-10-CM | POA: Diagnosis not present

## 2016-02-23 DIAGNOSIS — M79641 Pain in right hand: Secondary | ICD-10-CM | POA: Diagnosis not present

## 2016-02-23 DIAGNOSIS — M7989 Other specified soft tissue disorders: Secondary | ICD-10-CM | POA: Diagnosis not present

## 2016-02-28 DIAGNOSIS — G8929 Other chronic pain: Secondary | ICD-10-CM | POA: Diagnosis not present

## 2016-02-28 DIAGNOSIS — M2550 Pain in unspecified joint: Secondary | ICD-10-CM | POA: Diagnosis not present

## 2016-02-28 DIAGNOSIS — M5136 Other intervertebral disc degeneration, lumbar region: Secondary | ICD-10-CM | POA: Diagnosis not present

## 2016-02-28 DIAGNOSIS — M150 Primary generalized (osteo)arthritis: Secondary | ICD-10-CM | POA: Diagnosis not present

## 2016-03-06 ENCOUNTER — Ambulatory Visit
Admission: RE | Admit: 2016-03-06 | Discharge: 2016-03-06 | Disposition: A | Discharge status: Home / Self Care | Payer: PPO | Source: Ambulatory Visit | Attending: Orthopedic Surgery | Admitting: Orthopedic Surgery

## 2016-03-06 DIAGNOSIS — M5416 Radiculopathy, lumbar region: Secondary | ICD-10-CM | POA: Diagnosis not present

## 2016-03-06 DIAGNOSIS — M47816 Spondylosis without myelopathy or radiculopathy, lumbar region: Secondary | ICD-10-CM | POA: Diagnosis not present

## 2016-03-22 DIAGNOSIS — M47816 Spondylosis without myelopathy or radiculopathy, lumbar region: Secondary | ICD-10-CM | POA: Diagnosis not present

## 2016-03-22 DIAGNOSIS — M5416 Radiculopathy, lumbar region: Secondary | ICD-10-CM | POA: Diagnosis not present

## 2016-04-23 DIAGNOSIS — L570 Actinic keratosis: Secondary | ICD-10-CM | POA: Diagnosis not present

## 2016-04-23 DIAGNOSIS — D045 Carcinoma in situ of skin of trunk: Secondary | ICD-10-CM | POA: Diagnosis not present

## 2016-04-23 DIAGNOSIS — D485 Neoplasm of uncertain behavior of skin: Secondary | ICD-10-CM | POA: Diagnosis not present

## 2016-04-23 DIAGNOSIS — L820 Inflamed seborrheic keratosis: Secondary | ICD-10-CM | POA: Diagnosis not present

## 2016-04-23 DIAGNOSIS — Z7189 Other specified counseling: Secondary | ICD-10-CM | POA: Diagnosis not present

## 2016-04-27 DIAGNOSIS — I129 Hypertensive chronic kidney disease with stage 1 through stage 4 chronic kidney disease, or unspecified chronic kidney disease: Secondary | ICD-10-CM | POA: Diagnosis not present

## 2016-04-27 DIAGNOSIS — N183 Chronic kidney disease, stage 3 (moderate): Secondary | ICD-10-CM | POA: Diagnosis not present

## 2016-04-27 DIAGNOSIS — I10 Essential (primary) hypertension: Secondary | ICD-10-CM | POA: Diagnosis not present

## 2016-04-27 DIAGNOSIS — Z0000 Encounter for general adult medical examination without abnormal findings: Secondary | ICD-10-CM | POA: Diagnosis not present

## 2016-04-27 DIAGNOSIS — E1122 Type 2 diabetes mellitus with diabetic chronic kidney disease: Secondary | ICD-10-CM | POA: Diagnosis not present

## 2016-04-27 DIAGNOSIS — E7800 Pure hypercholesterolemia, unspecified: Secondary | ICD-10-CM | POA: Diagnosis not present

## 2016-05-14 DIAGNOSIS — R208 Other disturbances of skin sensation: Secondary | ICD-10-CM | POA: Diagnosis not present

## 2016-05-14 DIAGNOSIS — D045 Carcinoma in situ of skin of trunk: Secondary | ICD-10-CM | POA: Diagnosis not present

## 2016-05-14 DIAGNOSIS — L570 Actinic keratosis: Secondary | ICD-10-CM | POA: Diagnosis not present

## 2016-05-14 DIAGNOSIS — D224 Melanocytic nevi of scalp and neck: Secondary | ICD-10-CM | POA: Diagnosis not present

## 2016-05-14 DIAGNOSIS — L298 Other pruritus: Secondary | ICD-10-CM | POA: Diagnosis not present

## 2016-05-14 DIAGNOSIS — D485 Neoplasm of uncertain behavior of skin: Secondary | ICD-10-CM | POA: Diagnosis not present

## 2016-05-14 DIAGNOSIS — L820 Inflamed seborrheic keratosis: Secondary | ICD-10-CM | POA: Diagnosis not present

## 2016-05-15 DIAGNOSIS — E7800 Pure hypercholesterolemia, unspecified: Secondary | ICD-10-CM | POA: Diagnosis not present

## 2016-05-15 DIAGNOSIS — E1122 Type 2 diabetes mellitus with diabetic chronic kidney disease: Secondary | ICD-10-CM | POA: Diagnosis not present

## 2016-05-15 DIAGNOSIS — I129 Hypertensive chronic kidney disease with stage 1 through stage 4 chronic kidney disease, or unspecified chronic kidney disease: Secondary | ICD-10-CM | POA: Diagnosis not present

## 2016-05-15 DIAGNOSIS — N183 Chronic kidney disease, stage 3 (moderate): Secondary | ICD-10-CM | POA: Diagnosis not present

## 2016-05-15 DIAGNOSIS — I10 Essential (primary) hypertension: Secondary | ICD-10-CM | POA: Diagnosis not present

## 2016-05-15 DIAGNOSIS — Z6835 Body mass index (BMI) 35.0-35.9, adult: Secondary | ICD-10-CM | POA: Diagnosis not present

## 2016-05-24 DIAGNOSIS — E113292 Type 2 diabetes mellitus with mild nonproliferative diabetic retinopathy without macular edema, left eye: Secondary | ICD-10-CM | POA: Diagnosis not present

## 2016-05-24 DIAGNOSIS — H2513 Age-related nuclear cataract, bilateral: Secondary | ICD-10-CM | POA: Diagnosis not present

## 2016-05-31 DIAGNOSIS — M79604 Pain in right leg: Secondary | ICD-10-CM | POA: Diagnosis not present

## 2016-05-31 DIAGNOSIS — N183 Chronic kidney disease, stage 3 (moderate): Secondary | ICD-10-CM | POA: Diagnosis not present

## 2016-05-31 DIAGNOSIS — I129 Hypertensive chronic kidney disease with stage 1 through stage 4 chronic kidney disease, or unspecified chronic kidney disease: Secondary | ICD-10-CM | POA: Diagnosis not present

## 2016-05-31 DIAGNOSIS — E1122 Type 2 diabetes mellitus with diabetic chronic kidney disease: Secondary | ICD-10-CM | POA: Diagnosis not present

## 2016-05-31 DIAGNOSIS — M7989 Other specified soft tissue disorders: Secondary | ICD-10-CM | POA: Diagnosis not present

## 2016-06-19 DIAGNOSIS — E113311 Type 2 diabetes mellitus with moderate nonproliferative diabetic retinopathy with macular edema, right eye: Secondary | ICD-10-CM | POA: Diagnosis not present

## 2016-09-11 DIAGNOSIS — N183 Chronic kidney disease, stage 3 (moderate): Secondary | ICD-10-CM | POA: Diagnosis not present

## 2016-09-11 DIAGNOSIS — Z6835 Body mass index (BMI) 35.0-35.9, adult: Secondary | ICD-10-CM | POA: Diagnosis not present

## 2016-09-11 DIAGNOSIS — E1122 Type 2 diabetes mellitus with diabetic chronic kidney disease: Secondary | ICD-10-CM | POA: Diagnosis not present

## 2016-09-11 DIAGNOSIS — I129 Hypertensive chronic kidney disease with stage 1 through stage 4 chronic kidney disease, or unspecified chronic kidney disease: Secondary | ICD-10-CM | POA: Diagnosis not present

## 2016-09-11 DIAGNOSIS — I10 Essential (primary) hypertension: Secondary | ICD-10-CM | POA: Diagnosis not present

## 2016-09-11 DIAGNOSIS — E7800 Pure hypercholesterolemia, unspecified: Secondary | ICD-10-CM | POA: Diagnosis not present

## 2016-09-17 DIAGNOSIS — E113311 Type 2 diabetes mellitus with moderate nonproliferative diabetic retinopathy with macular edema, right eye: Secondary | ICD-10-CM | POA: Diagnosis not present

## 2016-09-18 DIAGNOSIS — I129 Hypertensive chronic kidney disease with stage 1 through stage 4 chronic kidney disease, or unspecified chronic kidney disease: Secondary | ICD-10-CM | POA: Diagnosis not present

## 2016-09-18 DIAGNOSIS — N183 Chronic kidney disease, stage 3 (moderate): Secondary | ICD-10-CM | POA: Diagnosis not present

## 2016-09-18 DIAGNOSIS — G4733 Obstructive sleep apnea (adult) (pediatric): Secondary | ICD-10-CM | POA: Diagnosis not present

## 2016-09-18 DIAGNOSIS — E7800 Pure hypercholesterolemia, unspecified: Secondary | ICD-10-CM | POA: Diagnosis not present

## 2016-09-18 DIAGNOSIS — Z0000 Encounter for general adult medical examination without abnormal findings: Secondary | ICD-10-CM | POA: Diagnosis not present

## 2016-09-18 DIAGNOSIS — I10 Essential (primary) hypertension: Secondary | ICD-10-CM | POA: Diagnosis not present

## 2016-09-18 DIAGNOSIS — E1122 Type 2 diabetes mellitus with diabetic chronic kidney disease: Secondary | ICD-10-CM | POA: Diagnosis not present

## 2016-09-18 DIAGNOSIS — M00272 Other streptococcal arthritis, left ankle and foot: Secondary | ICD-10-CM | POA: Diagnosis not present

## 2016-09-18 DIAGNOSIS — Z6835 Body mass index (BMI) 35.0-35.9, adult: Secondary | ICD-10-CM | POA: Diagnosis not present

## 2016-11-20 DIAGNOSIS — H2513 Age-related nuclear cataract, bilateral: Secondary | ICD-10-CM | POA: Diagnosis not present

## 2016-12-04 DIAGNOSIS — M25562 Pain in left knee: Secondary | ICD-10-CM | POA: Diagnosis not present

## 2017-01-04 DIAGNOSIS — M25552 Pain in left hip: Secondary | ICD-10-CM | POA: Diagnosis not present

## 2017-01-04 DIAGNOSIS — M5416 Radiculopathy, lumbar region: Secondary | ICD-10-CM | POA: Diagnosis not present

## 2017-01-04 DIAGNOSIS — M25551 Pain in right hip: Secondary | ICD-10-CM | POA: Diagnosis not present

## 2017-01-07 ENCOUNTER — Other Ambulatory Visit: Payer: Self-pay | Admitting: Physician Assistant

## 2017-01-07 DIAGNOSIS — M5416 Radiculopathy, lumbar region: Secondary | ICD-10-CM

## 2017-01-11 ENCOUNTER — Ambulatory Visit
Admission: RE | Admit: 2017-01-11 | Discharge: 2017-01-11 | Disposition: A | Discharge status: Home / Self Care | Payer: PPO | Source: Ambulatory Visit | Attending: Physician Assistant | Admitting: Physician Assistant

## 2017-01-11 DIAGNOSIS — M48061 Spinal stenosis, lumbar region without neurogenic claudication: Secondary | ICD-10-CM | POA: Insufficient documentation

## 2017-01-11 DIAGNOSIS — M25551 Pain in right hip: Secondary | ICD-10-CM | POA: Insufficient documentation

## 2017-01-11 DIAGNOSIS — M5116 Intervertebral disc disorders with radiculopathy, lumbar region: Secondary | ICD-10-CM | POA: Diagnosis not present

## 2017-01-11 DIAGNOSIS — M4316 Spondylolisthesis, lumbar region: Secondary | ICD-10-CM | POA: Diagnosis not present

## 2017-01-11 DIAGNOSIS — M5416 Radiculopathy, lumbar region: Secondary | ICD-10-CM

## 2017-01-14 DIAGNOSIS — M5416 Radiculopathy, lumbar region: Secondary | ICD-10-CM | POA: Diagnosis not present

## 2017-01-14 DIAGNOSIS — N183 Chronic kidney disease, stage 3 (moderate): Secondary | ICD-10-CM | POA: Diagnosis not present

## 2017-01-14 DIAGNOSIS — E1122 Type 2 diabetes mellitus with diabetic chronic kidney disease: Secondary | ICD-10-CM | POA: Diagnosis not present

## 2017-01-14 DIAGNOSIS — I10 Essential (primary) hypertension: Secondary | ICD-10-CM | POA: Diagnosis not present

## 2017-01-14 DIAGNOSIS — G4733 Obstructive sleep apnea (adult) (pediatric): Secondary | ICD-10-CM | POA: Diagnosis not present

## 2017-01-14 DIAGNOSIS — M5136 Other intervertebral disc degeneration, lumbar region: Secondary | ICD-10-CM | POA: Diagnosis not present

## 2017-01-14 DIAGNOSIS — I129 Hypertensive chronic kidney disease with stage 1 through stage 4 chronic kidney disease, or unspecified chronic kidney disease: Secondary | ICD-10-CM | POA: Diagnosis not present

## 2017-01-14 DIAGNOSIS — M48062 Spinal stenosis, lumbar region with neurogenic claudication: Secondary | ICD-10-CM | POA: Diagnosis not present

## 2017-01-15 DIAGNOSIS — M48062 Spinal stenosis, lumbar region with neurogenic claudication: Secondary | ICD-10-CM | POA: Diagnosis not present

## 2017-01-15 DIAGNOSIS — M5416 Radiculopathy, lumbar region: Secondary | ICD-10-CM | POA: Diagnosis not present

## 2017-01-15 DIAGNOSIS — M5136 Other intervertebral disc degeneration, lumbar region: Secondary | ICD-10-CM | POA: Diagnosis not present

## 2017-01-15 DIAGNOSIS — E113292 Type 2 diabetes mellitus with mild nonproliferative diabetic retinopathy without macular edema, left eye: Secondary | ICD-10-CM | POA: Diagnosis not present

## 2017-02-06 DIAGNOSIS — N183 Chronic kidney disease, stage 3 (moderate): Secondary | ICD-10-CM | POA: Diagnosis not present

## 2017-02-06 DIAGNOSIS — I10 Essential (primary) hypertension: Secondary | ICD-10-CM | POA: Diagnosis not present

## 2017-02-06 DIAGNOSIS — E1122 Type 2 diabetes mellitus with diabetic chronic kidney disease: Secondary | ICD-10-CM | POA: Diagnosis not present

## 2017-02-06 DIAGNOSIS — E7800 Pure hypercholesterolemia, unspecified: Secondary | ICD-10-CM | POA: Diagnosis not present

## 2017-02-06 DIAGNOSIS — I129 Hypertensive chronic kidney disease with stage 1 through stage 4 chronic kidney disease, or unspecified chronic kidney disease: Secondary | ICD-10-CM | POA: Diagnosis not present

## 2017-02-12 ENCOUNTER — Encounter: Payer: Self-pay | Admitting: Emergency Medicine

## 2017-02-12 ENCOUNTER — Other Ambulatory Visit
Admission: RE | Admit: 2017-02-12 | Discharge: 2017-02-12 | Disposition: A | Discharge status: Home / Self Care | Payer: PPO | Source: Ambulatory Visit | Attending: Internal Medicine | Admitting: Internal Medicine

## 2017-02-12 ENCOUNTER — Emergency Department: Payer: PPO

## 2017-02-12 ENCOUNTER — Other Ambulatory Visit: Payer: Self-pay

## 2017-02-12 ENCOUNTER — Emergency Department
Admission: EM | Admit: 2017-02-12 | Discharge: 2017-02-12 | Disposition: A | Discharge status: Home / Self Care | Payer: PPO | Attending: Emergency Medicine | Admitting: Emergency Medicine

## 2017-02-12 DIAGNOSIS — Z7984 Long term (current) use of oral hypoglycemic drugs: Secondary | ICD-10-CM | POA: Insufficient documentation

## 2017-02-12 DIAGNOSIS — R0789 Other chest pain: Secondary | ICD-10-CM

## 2017-02-12 DIAGNOSIS — Z87891 Personal history of nicotine dependence: Secondary | ICD-10-CM | POA: Insufficient documentation

## 2017-02-12 DIAGNOSIS — R05 Cough: Secondary | ICD-10-CM | POA: Diagnosis present

## 2017-02-12 DIAGNOSIS — E1122 Type 2 diabetes mellitus with diabetic chronic kidney disease: Secondary | ICD-10-CM | POA: Diagnosis not present

## 2017-02-12 DIAGNOSIS — G4733 Obstructive sleep apnea (adult) (pediatric): Secondary | ICD-10-CM | POA: Diagnosis not present

## 2017-02-12 DIAGNOSIS — J069 Acute upper respiratory infection, unspecified: Secondary | ICD-10-CM | POA: Diagnosis not present

## 2017-02-12 DIAGNOSIS — E119 Type 2 diabetes mellitus without complications: Secondary | ICD-10-CM | POA: Diagnosis not present

## 2017-02-12 DIAGNOSIS — I129 Hypertensive chronic kidney disease with stage 1 through stage 4 chronic kidney disease, or unspecified chronic kidney disease: Secondary | ICD-10-CM

## 2017-02-12 DIAGNOSIS — Z79899 Other long term (current) drug therapy: Secondary | ICD-10-CM | POA: Insufficient documentation

## 2017-02-12 DIAGNOSIS — N183 Chronic kidney disease, stage 3 (moderate): Secondary | ICD-10-CM | POA: Insufficient documentation

## 2017-02-12 DIAGNOSIS — R918 Other nonspecific abnormal finding of lung field: Secondary | ICD-10-CM | POA: Diagnosis not present

## 2017-02-12 DIAGNOSIS — R748 Abnormal levels of other serum enzymes: Secondary | ICD-10-CM | POA: Diagnosis not present

## 2017-02-12 DIAGNOSIS — Z96641 Presence of right artificial hip joint: Secondary | ICD-10-CM | POA: Insufficient documentation

## 2017-02-12 DIAGNOSIS — R079 Chest pain, unspecified: Secondary | ICD-10-CM

## 2017-02-12 DIAGNOSIS — E7800 Pure hypercholesterolemia, unspecified: Secondary | ICD-10-CM | POA: Diagnosis not present

## 2017-02-12 DIAGNOSIS — J189 Pneumonia, unspecified organism: Secondary | ICD-10-CM | POA: Diagnosis not present

## 2017-02-12 DIAGNOSIS — Z6835 Body mass index (BMI) 35.0-35.9, adult: Secondary | ICD-10-CM | POA: Diagnosis not present

## 2017-02-12 DIAGNOSIS — Z96651 Presence of right artificial knee joint: Secondary | ICD-10-CM | POA: Diagnosis not present

## 2017-02-12 DIAGNOSIS — J984 Other disorders of lung: Secondary | ICD-10-CM | POA: Diagnosis not present

## 2017-02-12 DIAGNOSIS — I10 Essential (primary) hypertension: Secondary | ICD-10-CM | POA: Insufficient documentation

## 2017-02-12 LAB — CBC
HEMATOCRIT: 47.9 % (ref 40.0–52.0)
HEMOGLOBIN: 16.1 g/dL (ref 13.0–18.0)
MCH: 31.7 pg (ref 26.0–34.0)
MCHC: 33.6 g/dL (ref 32.0–36.0)
MCV: 94.2 fL (ref 80.0–100.0)
Platelets: 193 10*3/uL (ref 150–440)
RBC: 5.09 MIL/uL (ref 4.40–5.90)
RDW: 14.5 % (ref 11.5–14.5)
WBC: 10.2 10*3/uL (ref 3.8–10.6)

## 2017-02-12 LAB — TROPONIN I
TROPONIN I: 0.11 ng/mL — AB (ref <0.03)
Troponin I: <0.03 ng/mL (ref <0.03)

## 2017-02-12 LAB — BASIC METABOLIC PANEL
ANION GAP: 7 (ref 5–15)
BUN: 18 mg/dL (ref 6–20)
CHLORIDE: 105 mmol/L (ref 101–111)
CO2: 25 mmol/L (ref 22–32)
Calcium: 9.8 mg/dL (ref 8.9–10.3)
Creatinine, Ser: 1.22 mg/dL (ref 0.61–1.24)
GFR calc Af Amer: >60 mL/min (ref >60)
GLUCOSE: 108 mg/dL — AB (ref 65–99)
POTASSIUM: 3.7 mmol/L (ref 3.5–5.1)
Sodium: 137 mmol/L (ref 135–145)

## 2017-02-12 MED ORDER — GUAIFENESIN-CODEINE 100-10 MG/5ML PO SOLN: 5.0000 mL | Freq: Four times a day (QID) | ORAL | 0 refills | Status: DC | PRN

## 2017-02-12 MED ORDER — IOPAMIDOL (ISOVUE-300) INJECTION 61%
75.0000 mL | Freq: Once | INTRAVENOUS | Status: AC | PRN
  Administered 2017-02-12: 75 mL via INTRAVENOUS

## 2017-02-12 NOTE — ED Triage Notes (Signed)
Pt reports that he has had cold symptoms since Monday he went to his PMD. They tooblood work at the office and his enzymes were elevated. He reports he felt like an elephant was sitting on his chest.

## 2017-02-12 NOTE — ED Notes (Signed)
Patient to CT via stretcher. Alert and oriented on departure.

## 2017-02-12 NOTE — ED Provider Notes (Signed)
[facility] Emergency Department Provider Note  Time seen: 7:18 PM  I have reviewed the triage vital signs and the nursing notes.   HISTORY  Chief Complaint Abnormal Lab and Chest Pain    HPI Michael Humphrey is a 67 y.o. male with a past medical history of diabetes, hypertension, hyperlipidemia presents to the emergency department for abnormal lab.  According to the patient for the past 2 days he has had cough, congestion.  Went to see his primary care doctor today who diagnosed him with likely pneumonia place him on Zithromax but did blood work.  States he was called back informing him that his heart enzyme was elevated and was told to go to the emergency department for further evaluation.  Patient does state chest pressure but believes it is related to the cough and congestion, denies any "pain."  Denies any leg pain or swelling.  Denies abdominal pain nausea or vomiting.  Patient states his chest is feeling much better after starting the Zithromax earlier today.  Patient is upset about the weight in the emergency department and is already requesting to go home.   Past Medical History:  Diagnosis Date  . Arthritis   . Diabetes mellitus without complication ([place])   . High cholesterol   . Hypertension     There are no active problems to display for this patient.   Past Surgical History:  Procedure Laterality Date  . BUNIONECTOMY    . CHOLECYSTECTOMY    . COLONOSCOPY    . COLONOSCOPY WITH PROPOFOL N/A 12/17/2014   Procedure: COLONOSCOPY WITH PROPOFOL;  Surgeon: Lollie Sails, MD;  Location: [facility] ENDOSCOPY;  Service: Endoscopy;  Laterality: N/A;  . correct hammertoe    . JOINT REPLACEMENT    . KNEE ARTHROSCOPY Right   . TOTAL HIP ARTHROPLASTY    . TOTAL KNEE ARTHROPLASTY Right     Prior to Admission medications   Medication Sig Start Date End Date Taking? Authorizing Provider  glipiZIDE-metformin (METAGLIP) 5-500 MG tablet Take 1 tablet by  mouth 2 (two) times daily before a meal.    [provider]  lisinopril (PRINIVIL,ZESTRIL) 2.5 MG tablet Take 2.5 mg by mouth daily.    [provider]  sildenafil (REVATIO) 20 MG tablet Take 20 mg by mouth 3 (three) times daily.    [provider]  spironolactone (ALDACTONE) 25 MG tablet Take 25 mg by mouth daily.    [provider]  torsemide (DEMADEX) 20 MG tablet Take 20 mg by mouth daily.    [provider]  traMADol (ULTRAM) 50 MG tablet Take 50 mg by mouth every 6 (six) hours as needed.    [provider]    Allergies  Allergen Reactions  . Clonidine Derivatives   . Fioricet [Butalbital-Apap-Caffeine]   . Hydrocodone-Acetaminophen   . Mobic [Meloxicam]   . Norvasc [Amlodipine Besylate]   . Statins     History reviewed. No pertinent family history.  Social History Social History   Tobacco Use  . Smoking status: Never Smoker  . Smokeless tobacco: Former Systems developer    Types: Chew  Substance Use Topics  . Alcohol use: No  . Drug use: No    Review of Systems Constitutional: Negative for fever.  Eyes: Negative for visual complaints ENT: Positive for congestion Cardiovascular: Positive for chest pressure yesterday and today, positive for frequent cough. Respiratory: Mild shortness of breath.  Positive for cough. Gastrointestinal: Negative for abdominal pain, vomiting Genitourinary: Negative for urinary compaints  Musculoskeletal: Negative for leg pain or swelling Skin: Negative for skin complaints  Neurological: Negative for headache All other ROS negative  ____________________________________________   PHYSICAL EXAM:  VITAL SIGNS: ED Triage Vitals  Enc Vitals Group     BP 02/12/17 1552 (!) 187/123     Pulse Rate 02/12/17 1552 62     Resp 02/12/17 1552 20     Temp 02/12/17 1552 97.9 F (36.6 C)     Temp Source 02/12/17 1552 Oral     SpO2 02/12/17 1552 98 %     Weight 02/12/17 1553 221 lb (100.2 kg)     Height  02/12/17 1553 5\' 9"  (1.753 m)     Head Circumference --      Peak Flow --      Pain Score 02/12/17 1552 10     Pain Loc --      Pain Edu? --      Excl. in [place]? --    Constitutional: Alert and oriented. Well appearing and in no distress. Eyes: Normal exam ENT   Head: Normocephalic and atraumatic.   Mouth/Throat: Mucous membranes are moist. Cardiovascular: Normal rate, regular rhythm. Respiratory: Normal respiratory effort without tachypnea nor retractions. Breath sounds are clear.  Frequent cough during exam. Gastrointestinal: Soft and nontender. No distention.   Musculoskeletal: Nontender with normal range of motion in all extremities. No lower extremity tenderness or edema. Neurologic:  Normal speech and language. No gross focal neurologic deficits Skin:  Skin is warm, dry and intact.  Psychiatric: Mood and affect are normal. Speech and behavior are normal.   ____________________________________________    EKG  EKG reviewed and interpreted by myself shows sinus tachycardia at 111 bpm with a narrow QRS, left axis deviation, largely normal intervals.  No concerning ST changes.  No ST elevation.  ____________________________________________    RADIOLOGY  Chest x-ray shows ill-defined density in the left middle lung.  Pneumonia versus lung cancer.  ____________________________________________   INITIAL IMPRESSION / ASSESSMENT AND PLAN / ED COURSE  Pertinent labs & imaging results that were available during my care of the patient were reviewed by me and considered in my medical decision making (see chart for details).  Patient presents to the emergency department for cough, congestion, abnormal blood work.  Patient was told that his heart enzyme is elevated.  Per record review his troponin drawn earlier today was 0.11.  Here patient appears overall well, frequent cough, clinical exam consistent with upper respiratory infection.  Differential would include URI, ACS,  pneumonia.  Patient's workup today is largely normal including a negative troponin with a normal white blood cell count and normal chemistry.  Patient's chest x-ray does show possible pneumonia versus lung mass.  No smoking history per patient but he did use tobacco chewing products.  Patient is already requesting discharge home, states he feels well besides an upper respiratory infection.  Patient is taking Zithromax prescribed by his doctor today.  Given the patient's chest x-ray he is agreeable to stay for a CT scan of the chest with contrast to further evaluate and help rule out any malignancy.  Given the patient's clinical picture highly suspect likely early pneumonia, the patient is Artie covered with an adequate antibiotic.  We will repeat a troponin as a precaution.  If the patient's repeat troponin remains negative and CT normal, anticipate likely discharge home.  Repeat troponin remains negative.  CT scan confirms likely pneumonia, recommend treatment with 87-month follow-up CT.  Discussed with patient.  Patient will  follow up with his doctor.  2- troponins with a likely pneumonia on CT, I believe this fits the clinical picture.  Patient already on antibiotics.  We will discharge home with cough medication and have the patient follow-up with his doctor.  ____________________________________________   FINAL CLINICAL IMPRESSION(S) / ED DIAGNOSES  Upper respiratory infection Chest pain Community-acquired pneumonia   Harvest Dark, MD 02/12/17 2019

## 2017-02-12 NOTE — Discharge Instructions (Signed)
As we discussed your CT scan today shows a likely pneumonia in your left lung.  Please finish your entire course of antibiotics as prescribed by your doctor.  Please take your cough medication as needed, as written.  We recommend he repeat chest imaging in approximately 3 months to ensure resolution of the pneumonia/opacity to help rule out a mass.  Return to the emergency department for any significant chest pain, shortness of breath, or any other symptom personally concerning to yourself.

## 2017-02-12 NOTE — ED Notes (Signed)
Spoke with pt about wait times and what to expect next. Advised pt that I am available for further questions if needed.  

## 2017-02-12 NOTE — ED Notes (Signed)
MD aware of elevated blood pressure. Patient has not taken his medications for hypertension today and will do so when he gets home.

## 2017-02-12 NOTE — ED Notes (Signed)
Again, spoke with pt about wait times and what to expect next. Advised pt that I am available for further questions if needed.

## 2017-02-15 DIAGNOSIS — M48062 Spinal stenosis, lumbar region with neurogenic claudication: Secondary | ICD-10-CM | POA: Diagnosis not present

## 2017-02-15 DIAGNOSIS — J189 Pneumonia, unspecified organism: Secondary | ICD-10-CM | POA: Diagnosis not present

## 2017-02-15 DIAGNOSIS — M5416 Radiculopathy, lumbar region: Secondary | ICD-10-CM | POA: Diagnosis not present

## 2017-02-15 DIAGNOSIS — I129 Hypertensive chronic kidney disease with stage 1 through stage 4 chronic kidney disease, or unspecified chronic kidney disease: Secondary | ICD-10-CM | POA: Diagnosis not present

## 2017-02-15 DIAGNOSIS — I10 Essential (primary) hypertension: Secondary | ICD-10-CM | POA: Diagnosis not present

## 2017-02-15 DIAGNOSIS — M5136 Other intervertebral disc degeneration, lumbar region: Secondary | ICD-10-CM | POA: Diagnosis not present

## 2017-02-15 DIAGNOSIS — N183 Chronic kidney disease, stage 3 (moderate): Secondary | ICD-10-CM | POA: Diagnosis not present

## 2017-02-15 DIAGNOSIS — E1122 Type 2 diabetes mellitus with diabetic chronic kidney disease: Secondary | ICD-10-CM | POA: Diagnosis not present

## 2017-04-05 DIAGNOSIS — L910 Hypertrophic scar: Secondary | ICD-10-CM | POA: Diagnosis not present

## 2017-04-05 DIAGNOSIS — L821 Other seborrheic keratosis: Secondary | ICD-10-CM | POA: Diagnosis not present

## 2017-04-05 DIAGNOSIS — Z08 Encounter for follow-up examination after completed treatment for malignant neoplasm: Secondary | ICD-10-CM | POA: Diagnosis not present

## 2017-04-05 DIAGNOSIS — L570 Actinic keratosis: Secondary | ICD-10-CM | POA: Diagnosis not present

## 2017-04-05 DIAGNOSIS — Z85828 Personal history of other malignant neoplasm of skin: Secondary | ICD-10-CM | POA: Diagnosis not present

## 2017-04-05 DIAGNOSIS — L218 Other seborrheic dermatitis: Secondary | ICD-10-CM | POA: Diagnosis not present

## 2017-05-01 DIAGNOSIS — M48062 Spinal stenosis, lumbar region with neurogenic claudication: Secondary | ICD-10-CM | POA: Diagnosis not present

## 2017-05-01 DIAGNOSIS — M5416 Radiculopathy, lumbar region: Secondary | ICD-10-CM | POA: Diagnosis not present

## 2017-05-01 DIAGNOSIS — M5136 Other intervertebral disc degeneration, lumbar region: Secondary | ICD-10-CM | POA: Diagnosis not present

## 2017-05-29 DIAGNOSIS — M2012 Hallux valgus (acquired), left foot: Secondary | ICD-10-CM | POA: Diagnosis not present

## 2017-05-29 DIAGNOSIS — E1142 Type 2 diabetes mellitus with diabetic polyneuropathy: Secondary | ICD-10-CM | POA: Diagnosis not present

## 2017-05-29 DIAGNOSIS — L97521 Non-pressure chronic ulcer of other part of left foot limited to breakdown of skin: Secondary | ICD-10-CM | POA: Diagnosis not present

## 2017-06-04 DIAGNOSIS — X32XXXA Exposure to sunlight, initial encounter: Secondary | ICD-10-CM | POA: Diagnosis not present

## 2017-06-04 DIAGNOSIS — L570 Actinic keratosis: Secondary | ICD-10-CM | POA: Diagnosis not present

## 2017-06-12 DIAGNOSIS — L97521 Non-pressure chronic ulcer of other part of left foot limited to breakdown of skin: Secondary | ICD-10-CM | POA: Diagnosis not present

## 2017-06-12 DIAGNOSIS — E1142 Type 2 diabetes mellitus with diabetic polyneuropathy: Secondary | ICD-10-CM | POA: Diagnosis not present

## 2017-06-12 DIAGNOSIS — M2012 Hallux valgus (acquired), left foot: Secondary | ICD-10-CM | POA: Diagnosis not present

## 2017-06-20 DIAGNOSIS — J4520 Mild intermittent asthma, uncomplicated: Secondary | ICD-10-CM | POA: Diagnosis not present

## 2017-07-01 DIAGNOSIS — I82409 Acute embolism and thrombosis of unspecified deep veins of unspecified lower extremity: Secondary | ICD-10-CM

## 2017-07-01 HISTORY — DX: Acute embolism and thrombosis of unspecified deep veins of unspecified lower extremity: I82.409

## 2017-07-15 ENCOUNTER — Emergency Department: Payer: PPO

## 2017-07-15 ENCOUNTER — Other Ambulatory Visit: Payer: Self-pay

## 2017-07-15 ENCOUNTER — Inpatient Hospital Stay
Admission: EM | Admit: 2017-07-15 | Discharge: 2017-07-17 | DRG: 065 | Disposition: A | Discharge status: Home / Self Care | Payer: PPO | Attending: Internal Medicine | Admitting: Internal Medicine

## 2017-07-15 ENCOUNTER — Encounter: Payer: Self-pay | Admitting: Emergency Medicine

## 2017-07-15 ENCOUNTER — Inpatient Hospital Stay: Payer: PPO

## 2017-07-15 DIAGNOSIS — I82C22 Chronic embolism and thrombosis of left internal jugular vein: Secondary | ICD-10-CM | POA: Diagnosis present

## 2017-07-15 DIAGNOSIS — Z96649 Presence of unspecified artificial hip joint: Secondary | ICD-10-CM | POA: Diagnosis not present

## 2017-07-15 DIAGNOSIS — Z79899 Other long term (current) drug therapy: Secondary | ICD-10-CM

## 2017-07-15 DIAGNOSIS — I5030 Unspecified diastolic (congestive) heart failure: Secondary | ICD-10-CM | POA: Diagnosis not present

## 2017-07-15 DIAGNOSIS — E785 Hyperlipidemia, unspecified: Secondary | ICD-10-CM | POA: Diagnosis not present

## 2017-07-15 DIAGNOSIS — Z66 Do not resuscitate: Secondary | ICD-10-CM | POA: Diagnosis present

## 2017-07-15 DIAGNOSIS — R4781 Slurred speech: Secondary | ICD-10-CM | POA: Diagnosis not present

## 2017-07-15 DIAGNOSIS — Z86718 Personal history of other venous thrombosis and embolism: Secondary | ICD-10-CM

## 2017-07-15 DIAGNOSIS — I639 Cerebral infarction, unspecified: Secondary | ICD-10-CM | POA: Diagnosis not present

## 2017-07-15 DIAGNOSIS — R29810 Facial weakness: Secondary | ICD-10-CM | POA: Diagnosis present

## 2017-07-15 DIAGNOSIS — I82409 Acute embolism and thrombosis of unspecified deep veins of unspecified lower extremity: Secondary | ICD-10-CM | POA: Diagnosis not present

## 2017-07-15 DIAGNOSIS — R29703 NIHSS score 3: Secondary | ICD-10-CM | POA: Diagnosis present

## 2017-07-15 DIAGNOSIS — Z885 Allergy status to narcotic agent status: Secondary | ICD-10-CM

## 2017-07-15 DIAGNOSIS — M1990 Unspecified osteoarthritis, unspecified site: Secondary | ICD-10-CM | POA: Diagnosis not present

## 2017-07-15 DIAGNOSIS — Z96651 Presence of right artificial knee joint: Secondary | ICD-10-CM | POA: Diagnosis present

## 2017-07-15 DIAGNOSIS — E119 Type 2 diabetes mellitus without complications: Secondary | ICD-10-CM | POA: Diagnosis not present

## 2017-07-15 DIAGNOSIS — I10 Essential (primary) hypertension: Secondary | ICD-10-CM | POA: Diagnosis not present

## 2017-07-15 DIAGNOSIS — O2230 Deep phlebothrombosis in pregnancy, unspecified trimester: Secondary | ICD-10-CM

## 2017-07-15 DIAGNOSIS — Z888 Allergy status to other drugs, medicaments and biological substances status: Secondary | ICD-10-CM

## 2017-07-15 DIAGNOSIS — R4701 Aphasia: Secondary | ICD-10-CM | POA: Diagnosis present

## 2017-07-15 DIAGNOSIS — I82C12 Acute embolism and thrombosis of left internal jugular vein: Secondary | ICD-10-CM | POA: Diagnosis not present

## 2017-07-15 DIAGNOSIS — Z7189 Other specified counseling: Secondary | ICD-10-CM | POA: Diagnosis not present

## 2017-07-15 DIAGNOSIS — I63233 Cerebral infarction due to unspecified occlusion or stenosis of bilateral carotid arteries: Secondary | ICD-10-CM | POA: Diagnosis not present

## 2017-07-15 DIAGNOSIS — Z7984 Long term (current) use of oral hypoglycemic drugs: Secondary | ICD-10-CM

## 2017-07-15 DIAGNOSIS — I6789 Other cerebrovascular disease: Secondary | ICD-10-CM | POA: Diagnosis not present

## 2017-07-15 HISTORY — DX: Cerebral infarction, unspecified: I63.9

## 2017-07-15 LAB — GLUCOSE, CAPILLARY
Glucose-Capillary: 113 mg/dL — ABNORMAL HIGH (ref 70–99)
Glucose-Capillary: 197 mg/dL — ABNORMAL HIGH (ref 70–99)

## 2017-07-15 LAB — PROTIME-INR
INR: 0.97
PROTHROMBIN TIME: 12.8 s (ref 11.4–15.2)

## 2017-07-15 LAB — COMPREHENSIVE METABOLIC PANEL
ALT: 25 U/L (ref 0–44)
ANION GAP: 10 (ref 5–15)
AST: 22 U/L (ref 15–41)
Albumin: 4.2 g/dL (ref 3.5–5.0)
Alkaline Phosphatase: 106 U/L (ref 38–126)
BILIRUBIN TOTAL: 1.1 mg/dL (ref 0.3–1.2)
BUN: 28 mg/dL — ABNORMAL HIGH (ref 8–23)
CO2: 28 mmol/L (ref 22–32)
Calcium: 9.7 mg/dL (ref 8.9–10.3)
Chloride: 102 mmol/L (ref 98–111)
Creatinine, Ser: 1.16 mg/dL (ref 0.61–1.24)
Glucose, Bld: 159 mg/dL — ABNORMAL HIGH (ref 70–99)
POTASSIUM: 3.9 mmol/L (ref 3.5–5.1)
Sodium: 140 mmol/L (ref 135–145)
TOTAL PROTEIN: 7.2 g/dL (ref 6.5–8.1)

## 2017-07-15 LAB — CBC
HCT: 46.8 % (ref 40.0–52.0)
HEMOGLOBIN: 16.3 g/dL (ref 13.0–18.0)
MCH: 33 pg (ref 26.0–34.0)
MCHC: 34.8 g/dL (ref 32.0–36.0)
MCV: 94.9 fL (ref 80.0–100.0)
PLATELETS: 174 10*3/uL (ref 150–440)
RBC: 4.94 MIL/uL (ref 4.40–5.90)
RDW: 14 % (ref 11.5–14.5)
WBC: 7.3 10*3/uL (ref 3.8–10.6)

## 2017-07-15 LAB — APTT: APTT: 30 s (ref 24–36)

## 2017-07-15 LAB — DIFFERENTIAL
Basophils Absolute: 0 10*3/uL (ref 0–0.1)
Basophils Relative: 1 %
EOS ABS: 0.3 10*3/uL (ref 0–0.7)
EOS PCT: 4 %
LYMPHS ABS: 1.3 10*3/uL (ref 1.0–3.6)
Lymphocytes Relative: 18 %
MONOS PCT: 7 %
Monocytes Absolute: 0.5 10*3/uL (ref 0.2–1.0)
NEUTROS PCT: 70 %
Neutro Abs: 5.1 10*3/uL (ref 1.4–6.5)

## 2017-07-15 LAB — TROPONIN I

## 2017-07-15 LAB — HEMOGLOBIN A1C
HEMOGLOBIN A1C: 7.1 % — AB (ref 4.8–5.6)
Mean Plasma Glucose: 157.07 mg/dL

## 2017-07-15 MED ORDER — ENOXAPARIN SODIUM 40 MG/0.4ML ~~LOC~~ SOLN
40.0000 mg | SUBCUTANEOUS | Status: DC
  Administered 2017-07-15 – 2017-07-16 (×2): 40 mg via SUBCUTANEOUS
  Filled 2017-07-15 (×2): qty 0.4

## 2017-07-15 MED ORDER — GABAPENTIN 300 MG PO CAPS
300.0000 mg | ORAL_CAPSULE | Freq: Three times a day (TID) | ORAL | Status: DC
  Administered 2017-07-15 – 2017-07-17 (×6): 300 mg via ORAL
  Filled 2017-07-15 (×6): qty 1

## 2017-07-15 MED ORDER — ASPIRIN 325 MG PO TABS
325.0000 mg | ORAL_TABLET | Freq: Every day | ORAL | Status: DC
  Administered 2017-07-16 – 2017-07-17 (×2): 325 mg via ORAL
  Filled 2017-07-15 (×3): qty 1

## 2017-07-15 MED ORDER — TRAZODONE HCL 100 MG PO TABS
100.0000 mg | ORAL_TABLET | Freq: Every day | ORAL | Status: DC
  Filled 2017-07-15: qty 1

## 2017-07-15 MED ORDER — INSULIN ASPART 100 UNIT/ML ~~LOC~~ SOLN
0.0000 [IU] | Freq: Three times a day (TID) | SUBCUTANEOUS | Status: DC
  Administered 2017-07-16: 13:00:00 2 [IU] via SUBCUTANEOUS
  Administered 2017-07-16: 08:00:00 1 [IU] via SUBCUTANEOUS
  Administered 2017-07-16 – 2017-07-17 (×2): 2 [IU] via SUBCUTANEOUS
  Administered 2017-07-17: 3 [IU] via SUBCUTANEOUS
  Filled 2017-07-15 (×4): qty 1

## 2017-07-15 MED ORDER — HYDRALAZINE HCL 20 MG/ML IJ SOLN
10.0000 mg | Freq: Four times a day (QID) | INTRAMUSCULAR | Status: DC | PRN
  Administered 2017-07-15: 10 mg via INTRAVENOUS
  Filled 2017-07-15: qty 1

## 2017-07-15 MED ORDER — CARVEDILOL 6.25 MG PO TABS
3.1250 mg | ORAL_TABLET | Freq: Two times a day (BID) | ORAL | Status: DC
  Administered 2017-07-15 – 2017-07-17 (×4): 3.125 mg via ORAL
  Filled 2017-07-15 (×2): qty 0.5
  Filled 2017-07-15: qty 1
  Filled 2017-07-15 (×3): qty 0.5
  Filled 2017-07-15 (×3): qty 1

## 2017-07-15 MED ORDER — ACETAMINOPHEN 325 MG PO TABS: 650.0000 mg | ORAL_TABLET | ORAL | Status: DC | PRN

## 2017-07-15 MED ORDER — LISINOPRIL 5 MG PO TABS
2.5000 mg | ORAL_TABLET | Freq: Every day | ORAL | Status: DC
  Administered 2017-07-16 – 2017-07-17 (×2): 2.5 mg via ORAL
  Filled 2017-07-15 (×2): qty 1

## 2017-07-15 MED ORDER — ACETAMINOPHEN 650 MG RE SUPP: 650.0000 mg | RECTAL | Status: DC | PRN

## 2017-07-15 MED ORDER — INSULIN ASPART 100 UNIT/ML ~~LOC~~ SOLN: 0.0000 [IU] | Freq: Every day | SUBCUTANEOUS | Status: DC

## 2017-07-15 MED ORDER — CYCLOBENZAPRINE HCL 10 MG PO TABS: 5.0000 mg | ORAL_TABLET | Freq: Every evening | ORAL | Status: DC | PRN

## 2017-07-15 MED ORDER — ACETAMINOPHEN 160 MG/5ML PO SOLN
650.0000 mg | ORAL | Status: DC | PRN
  Filled 2017-07-15: qty 20.3

## 2017-07-15 MED ORDER — LORAZEPAM 2 MG/ML IJ SOLN
1.0000 mg | Freq: Once | INTRAMUSCULAR | Status: AC
  Administered 2017-07-15: 1 mg via INTRAVENOUS
  Filled 2017-07-15 (×2): qty 1

## 2017-07-15 MED ORDER — ASPIRIN 81 MG PO CHEW
324.0000 mg | CHEWABLE_TABLET | Freq: Once | ORAL | Status: AC
  Administered 2017-07-15: 324 mg via ORAL
  Filled 2017-07-15: qty 4

## 2017-07-15 MED ORDER — STROKE: EARLY STAGES OF RECOVERY BOOK
Freq: Once | Status: AC
  Administered 2017-07-15: 17:00:00

## 2017-07-15 MED ORDER — ASPIRIN 300 MG RE SUPP: 300.0000 mg | Freq: Every day | RECTAL | Status: DC

## 2017-07-15 NOTE — ED Triage Notes (Signed)
pateint sent from [facility] for right side facial droop.  This started last night around 5 or 6pm.  His arms have no drift.  His speech is slurred and per wife is more than usual.

## 2017-07-15 NOTE — ED Notes (Signed)
Patient transported to CT at this time. 

## 2017-07-15 NOTE — ED Provider Notes (Signed)
[facility] Emergency Department Provider Note       Time seen: ----------------------------------------- 2:18 PM on 07/15/2017 -----------------------------------------   I have reviewed the triage vital signs and the nursing notes.  HISTORY   Chief Complaint Cerebrovascular Accident    HPI Michael Humphrey is a 67 y.o. male with a history of arthritis, diabetes, hyperlipidemia and hypertension who presents to the ED for slurred speech and right facial droop that began sometime this weekend.  Patient states he felt like his symptoms started perhaps on Friday, on Saturday he started drooling and wife reports she noticed slurred speech yesterday.  He was sent from [facility] for same.  Time of onset seems to vary.  Past Medical History:  Diagnosis Date  . Arthritis   . Diabetes mellitus without complication ([place])   . High cholesterol   . Hypertension     There are no active problems to display for this patient.   Past Surgical History:  Procedure Laterality Date  . BUNIONECTOMY    . CHOLECYSTECTOMY    . COLONOSCOPY    . COLONOSCOPY WITH PROPOFOL N/A 12/17/2014   Procedure: COLONOSCOPY WITH PROPOFOL;  Surgeon: Lollie Sails, MD;  Location: [facility] ENDOSCOPY;  Service: Endoscopy;  Laterality: N/A;  . correct hammertoe    . JOINT REPLACEMENT    . KNEE ARTHROSCOPY Right   . TOTAL HIP ARTHROPLASTY    . TOTAL KNEE ARTHROPLASTY Right     Allergies Clonidine derivatives; Fioricet [butalbital-apap-caffeine]; Hydrocodone-acetaminophen; Mobic [meloxicam]; Norvasc [amlodipine besylate]; and Statins  Social History Social History   Tobacco Use  . Smoking status: Never Smoker  . Smokeless tobacco: Former Systems developer    Types: Chew  Substance Use Topics  . Alcohol use: No  . Drug use: No   Review of Systems Constitutional: Negative for fever. Eyes: Negative for vision changes ENT: Positive for slurred speech and drooling Cardiovascular:  Negative for chest pain. Respiratory: Negative for shortness of breath. Gastrointestinal: Negative for abdominal pain, vomiting and diarrhea. Musculoskeletal: Negative for back pain. Skin: Negative for rash. Neurological: Positive for right-sided facial droop and slurred speech  All systems negative/normal/unremarkable except as stated in the HPI  ____________________________________________   PHYSICAL EXAM:  VITAL SIGNS: ED Triage Vitals  Enc Vitals Group     BP 07/15/17 1234 (!) 157/89     Pulse Rate 07/15/17 1234 73     Resp 07/15/17 1234 16     Temp 07/15/17 1234 97.6 F (36.4 C)     Temp Source 07/15/17 1234 Oral     SpO2 07/15/17 1234 97 %     Weight 07/15/17 1235 221 lb (100.2 kg)     Height 07/15/17 1235 5\' 9"  (1.753 m)     Head Circumference --      Peak Flow --      Pain Score 07/15/17 1235 0     Pain Loc --      Pain Edu? --      Excl. in [place]? --    Constitutional: Alert and oriented. Well appearing and in no distress. Eyes: Conjunctivae are normal. Normal extraocular movements. ENT   Head: Normocephalic and atraumatic.   Nose: No congestion/rhinnorhea.   Mouth/Throat: Mucous membranes are moist.   Neck: No stridor. Cardiovascular: Normal rate, regular rhythm. No murmurs, rubs, or gallops. Respiratory: Normal respiratory effort without tachypnea nor retractions. Breath sounds are clear and equal bilaterally. No wheezes/rales/rhonchi. Gastrointestinal: Soft and nontender. Normal bowel sounds Musculoskeletal: Nontender with normal range of motion  in extremities. No lower extremity tenderness nor edema. Neurologic: Slurred speech is noted, right-sided facial droop is noted, bilateral upper extremity weakness without any sensory deficit in the face, arms or legs.  Normal finger-to-nose, no pronator drift. Skin:  Skin is warm, dry and intact. No rash noted. Psychiatric: Mood and affect are normal. Speech and behavior are normal.   ____________________________________________  EKG: Interpreted by me.  Sinus rhythm rate 73 bpm, left axis deviation, normal QRS size, normal QT  ____________________________________________  ED COURSE:  As part of my medical decision making, I reviewed the following data within the [place] History obtained from family if available, nursing notes, old chart and ekg, as well as notes from prior ED visits. Patient presented for likely CVA, we will assess with labs and imaging as indicated at this time.   Procedures ____________________________________________   LABS (pertinent positives/negatives)  Labs Reviewed  COMPREHENSIVE METABOLIC PANEL - Abnormal; Notable for the following components:      Result Value   Glucose, Bld 159 (*)    BUN 28 (*)    All other components within normal limits  PROTIME-INR  APTT  CBC  DIFFERENTIAL  TROPONIN I  CBG MONITORING, ED    RADIOLOGY Images were viewed by me  CT head is unremarkable  ____________________________________________  DIFFERENTIAL DIAGNOSIS   CVA, TIA, Parkinson's disease  FINAL ASSESSMENT AND PLAN  CVA   Plan: The patient had presented for facial droop and slurred speech. Patient's labs were reassuring. Patient's imaging did not reveal any acute process.  Clinically patient has had a CVA and he was started on aspirin here.  I will order an MRI.  He may require Ativan for anxiety prior to MRI.  Patient is agreeable to plan.   Laurence Aly, MD   Note: This note was generated in part or whole with voice recognition software. Voice recognition is usually quite accurate but there are transcription errors that can and very often do occur. I apologize for any typographical errors that were not detected and corrected.     Earleen Newport, MD 07/15/17 1440

## 2017-07-15 NOTE — Progress Notes (Addendum)
Advance care planning  Purpose of Encounter Discussed acute hospitalization for CVA and CODE STATUS  Parties in Attendance Patient is alert and oriented.  Able to make medical decisions.  Wife at bedside is his healthcare power of attorney  Discussed regarding acute CVA, prognosis and treatment plan.  All questions answered.  Patient is hopeful he will be able to go home tomorrow.  CODE STATUS including intubation, CPR and defibrillation discussed.  Patient wants to be a DO NOT RESUSCITATE and DO NOT INTUBATE.  He asked further questions about the process.  Explained and answered all questions.  He wants to be DO NOT RESUSCITATE and DO NOT INTUBATE.  Orders entered.  Time spent - 18 minutes

## 2017-07-15 NOTE — H&P (Signed)
[facility] [place] NAME: Michael Humphrey    MR#:  [id number]  DATE OF BIRTH:  1950/08/18  DATE OF ADMISSION:  07/15/2017  PRIMARY CARE PHYSICIAN: Kirk Ruths, MD   REQUESTING/REFERRING PHYSICIAN: Dr. Jimmye Norman  CHIEF COMPLAINT:   Chief Complaint  Patient presents with  . Cerebrovascular Accident    HISTORY OF PRESENT ILLNESS:  Michael Humphrey  is a 67 y.o. male with a known history of hypertension, diabetes, hyperlipidemia presents to the emergency room with acute onset of right facial droop, drooling and slurred speech since Friday.  Symptoms have been there for close to 48 hours.  Outside TPA window.  CT scan of the head normal.  No focal weakness or numbness.  Patient is being admitted for further work-up of CVA.  Blood pressure is elevated. Bedside swallow screen normal.  PAST MEDICAL HISTORY:   Past Medical History:  Diagnosis Date  . Arthritis   . Diabetes mellitus without complication ([place])   . High cholesterol   . Hypertension     PAST SURGICAL HISTORY:   Past Surgical History:  Procedure Laterality Date  . BUNIONECTOMY    . CHOLECYSTECTOMY    . COLONOSCOPY    . COLONOSCOPY WITH PROPOFOL N/A 12/17/2014   Procedure: COLONOSCOPY WITH PROPOFOL;  Surgeon: Lollie Sails, MD;  Location: [facility] ENDOSCOPY;  Service: Endoscopy;  Laterality: N/A;  . correct hammertoe    . JOINT REPLACEMENT    . KNEE ARTHROSCOPY Right   . TOTAL HIP ARTHROPLASTY    . TOTAL KNEE ARTHROPLASTY Right     SOCIAL HISTORY:   Social History   Tobacco Use  . Smoking status: Never Smoker  . Smokeless tobacco: Former Systems developer    Types: Chew  Substance Use Topics  . Alcohol use: No    FAMILY HISTORY:   Family History  Problem Relation Age of Onset  . Clotting disorder Father   . Hypertension Father     DRUG ALLERGIES:   Allergies  Allergen Reactions  . Clonidine Derivatives   . Fioricet [Butalbital-Apap-Caffeine]   .  Hydrocodone-Acetaminophen   . Mobic [Meloxicam]   . Norvasc [Amlodipine Besylate]   . Statins   . Tramadol Itching    REVIEW OF SYSTEMS:   Review of Systems  Constitutional: Positive for malaise/fatigue. Negative for chills and fever.  HENT: Negative for sore throat.   Eyes: Negative for blurred vision, double vision and pain.  Respiratory: Negative for cough, hemoptysis, shortness of breath and wheezing.   Cardiovascular: Negative for chest pain, palpitations, orthopnea and leg swelling.  Gastrointestinal: Negative for abdominal pain, constipation, diarrhea, heartburn, nausea and vomiting.  Genitourinary: Negative for dysuria and hematuria.  Musculoskeletal: Negative for back pain and joint pain.  Skin: Negative for rash.  Neurological: Positive for speech change and headaches. Negative for sensory change and focal weakness.  Endo/Heme/Allergies: Does not bruise/bleed easily.  Psychiatric/Behavioral: Negative for depression. The patient is not nervous/anxious.     MEDICATIONS AT HOME:   Prior to Admission medications   Medication Sig Start Date End Date Taking? Authorizing Provider  carvedilol (COREG) 3.125 MG tablet Take 3.125 mg by mouth 2 (two) times daily. 06/04/17  Yes [provider]  cyclobenzaprine (FLEXERIL) 5 MG tablet Take 5-10 mg by mouth at bedtime as needed for muscle spasms.   Yes [provider]  gabapentin (NEURONTIN) 300 MG capsule Take 300 mg by mouth 3 (three) times daily.   Yes [provider]  glyBURIDE-metformin (  GLUCOVANCE) 2.5-500 MG tablet Take 2 tablets by mouth 2 (two) times daily.   Yes [provider]  lisinopril (PRINIVIL,ZESTRIL) 2.5 MG tablet Take 2.5 mg by mouth daily.   Yes [provider]  sildenafil (REVATIO) 20 MG tablet Take 40-100 mg by mouth daily as needed.    Yes [provider]  spironolactone (ALDACTONE) 25 MG tablet Take 25 mg by mouth daily.   Yes [provider]  torsemide  (DEMADEX) 20 MG tablet Take 60 mg by mouth daily.    Yes [provider]  traZODone (DESYREL) 50 MG tablet Take 50-100 mg by mouth at bedtime.   Yes [provider]  guaiFENesin-codeine 100-10 MG/5ML syrup Take 5 mLs by mouth every 6 (six) hours as needed for cough. Patient not taking: Reported on 07/15/2017 02/12/17   Harvest Dark, MD     VITAL SIGNS:  Blood pressure (!) 169/103, pulse 75, temperature 97.6 F (36.4 C), resp. rate 15, height 5\' 9"  (1.753 m), weight 100.2 kg (221 lb), SpO2 97 %.  PHYSICAL EXAMINATION:  Physical Exam  GENERAL:  67 y.o.-year-old patient lying in the bed with no acute distress.  EYES: Pupils equal, round, reactive to light and accommodation. No scleral icterus. Extraocular muscles intact.  HEENT: Head atraumatic, normocephalic. Oropharynx and nasopharynx clear. No oropharyngeal erythema, moist oral mucosa  NECK:  Supple, no jugular venous distention. No thyroid enlargement, no tenderness.  LUNGS: Normal breath sounds bilaterally, no wheezing, rales, rhonchi. No use of accessory muscles of respiration.  CARDIOVASCULAR: S1, S2 normal. No murmurs, rubs, or gallops.  ABDOMEN: Soft, nontender, nondistended. Bowel sounds present. No organomegaly or mass.  EXTREMITIES: No pedal edema, cyanosis, or clubbing. + 2 pedal & radial pulses b/l.   NEUROLOGIC: Cranial nerves II through XII intact except right facial droop.  Noticed slurred speech.  Motor strength 5/5 in upper and lower committee's with sensations intact. PSYCHIATRIC: The patient is alert and oriented x 3. Good affect.  SKIN: No obvious rash, lesion, or ulcer.   LABORATORY PANEL:   CBC Recent Labs  Lab 07/15/17 1236  WBC 7.3  HGB 16.3  HCT 46.8  PLT 174   ------------------------------------------------------------------------------------------------------------------  Chemistries  Recent Labs  Lab 07/15/17 1236  NA 140  K 3.9  CL 102  CO2 28  GLUCOSE 159*  BUN 28*   CREATININE 1.16  CALCIUM 9.7  AST 22  ALT 25  ALKPHOS 106  BILITOT 1.1   ------------------------------------------------------------------------------------------------------------------  Cardiac Enzymes Recent Labs  Lab 07/15/17 1236  TROPONINI <0.03   ------------------------------------------------------------------------------------------------------------------  RADIOLOGY:  Ct Head Wo Contrast  Result Date: 07/15/2017 CLINICAL DATA:  Patient reports on Saturday he started drooling. Wife reports noticing slurred speech yesterday 1300. Patient noted to have right sided facial droop. No anticoagulation. EXAM: CT HEAD WITHOUT CONTRAST TECHNIQUE: Contiguous axial images were obtained from the base of the skull through the vertex without intravenous contrast. COMPARISON:  None. FINDINGS: Brain: No evidence of acute infarction, hemorrhage, hydrocephalus, extra-axial collection or mass lesion/mass effect. There is ventricular and sulcal enlargement consistent with mild atrophy. Are small areas of white matter hypoattenuation are likely due to mild chronic microvascular ischemic change. Vascular: No hyperdense vessel or unexpected calcification. Skull: Normal. Negative for fracture or focal lesion. Sinuses/Orbits: Normal globes and orbits. Visualized sinuses and mastoid air cells are clear. Other: None. IMPRESSION: 1. No acute intracranial abnormalities. 2. Mild atrophy and chronic microvascular ischemic change. Electronically Signed   By: Lajean Manes M.D.   On: 07/15/2017 13:01  IMPRESSION AND PLAN:   *Acute CVA with CT head normal.  Symptoms typical for stroke.  Patient will be admitted to stroke floor with telemetry monitoring and neurochecks.  Aspirin, statin.  Check fasting lipid profile and hemoglobin A1c.   Check MRI of the brain, echocardiogram and carotid Dopplers. Consult neurology for further input  *Hypertension.  Blood pressure significantly elevated.  Will restart  patient's Coreg and lisinopril.  2 days from the time of onset of stroke.  Would like to trend blood pressure drowns slowly. PRN IV hydralazine ordered  *Diabetes mellitus.  Hold metformin and start sliding scale insulin  DVT prophylaxis with Lovenox  All the records are reviewed and case discussed with ED provider. Management plans discussed with the patient, family and they are in agreement.  CODE STATUS: DO NOT RESUSCITATE  TOTAL TIME TAKING CARE OF THIS PATIENT: 35 minutes.   Neita Carp M.D on 07/15/2017 at 3:39 PM  Between 7am to 6pm - Pager - [phone]  After 6pm go to [email or web address] - password [email or web address] [facility]  Office  [phone]  CC: Primary care physician; Kirk Ruths, MD  Note: This dictation was prepared with Dragon dictation along with smaller phrase technology. Any transcriptional errors that result from this process are unintentional.

## 2017-07-15 NOTE — ED Notes (Signed)
Pt being transported to rm 129 at this time.

## 2017-07-15 NOTE — ED Triage Notes (Signed)
Patient presents to ED via POV from home. Patient reports on Saturday he started drooling. Wife reports noticing slurred speech yesterday 1300. Patient noted to have right sided facial droop. No anticoagulation.

## 2017-07-16 ENCOUNTER — Inpatient Hospital Stay: Payer: PPO

## 2017-07-16 ENCOUNTER — Inpatient Hospital Stay (HOSPITAL_COMMUNITY)
Admit: 2017-07-16 | Discharge: 2017-07-16 | Disposition: A | Discharge status: Home / Self Care | Payer: PPO | Attending: Internal Medicine | Admitting: Internal Medicine

## 2017-07-16 DIAGNOSIS — I5030 Unspecified diastolic (congestive) heart failure: Secondary | ICD-10-CM

## 2017-07-16 DIAGNOSIS — I82C29 Chronic embolism and thrombosis of unspecified internal jugular vein: Secondary | ICD-10-CM

## 2017-07-16 DIAGNOSIS — I639 Cerebral infarction, unspecified: Principal | ICD-10-CM

## 2017-07-16 HISTORY — DX: Chronic embolism and thrombosis of unspecified internal jugular vein: I82.C29

## 2017-07-16 LAB — LIPID PANEL
CHOL/HDL RATIO: 6.9 ratio
CHOLESTEROL: 172 mg/dL (ref 0–200)
HDL: 25 mg/dL — ABNORMAL LOW (ref >40)
LDL CALC: 108 mg/dL — AB (ref 0–99)
TRIGLYCERIDES: 194 mg/dL — AB (ref <150)
VLDL: 39 mg/dL (ref 0–40)

## 2017-07-16 LAB — GLUCOSE, CAPILLARY
GLUCOSE-CAPILLARY: 179 mg/dL — AB (ref 70–99)
Glucose-Capillary: 147 mg/dL — ABNORMAL HIGH (ref 70–99)
Glucose-Capillary: 163 mg/dL — ABNORMAL HIGH (ref 70–99)
Glucose-Capillary: 157 mg/dL — ABNORMAL HIGH (ref 70–99)

## 2017-07-16 MED ORDER — GLUCERNA SHAKE PO LIQD
237.0000 mL | Freq: Three times a day (TID) | ORAL | Status: DC
  Administered 2017-07-16: 237 mL via ORAL

## 2017-07-16 MED ORDER — TRAZODONE HCL 50 MG PO TABS: 100.0000 mg | ORAL_TABLET | Freq: Every evening | ORAL | Status: DC | PRN

## 2017-07-16 NOTE — Progress Notes (Signed)
OT Cancellation Note  Patient Details Name: Michael Humphrey MRN: [id number] DOB: November 02, 1950   Cancelled Treatment:    Reason Eval/Treat Not Completed: OT screened, no needs identified, will sign off. Order received, chart reviewed. On 2nd attempt, pt mildly agitated but with encouragement from spouse, complies with OT questions. Alert and oriented, appropriate responses to situational safety questions. No visual, coordination, strength, balance, or sensory deficits appreciated upon brief assessment. Pt strongly denies any OT needs at this time. Will sign off. Please re-consult if additional needs arise. Thank you for the consult.  Jeni Salles, MPH, MS, OTR/L ascom [phone] 07/16/17, 8:48 AM

## 2017-07-16 NOTE — Progress Notes (Signed)
*  PRELIMINARY RESULTS* Echocardiogram 2D Echocardiogram has been performed.  Michael Humphrey 07/16/2017, 9:29 AM

## 2017-07-16 NOTE — Progress Notes (Signed)
SLP Cancellation Note  Patient Details Name: Michael Humphrey MRN: [id number] DOB: 11-19-1950   Cancelled treatment:       Reason Eval/Treat Not Completed: SLP screened, no needs identified, will sign off(chart reviewed; consulted NSG then met w/ pt/Wife in room). Met briefly w/ pt and Wife; noted slight-mild Dysarthria of speech as he conversed w/ SLP stating he did "not" feel he needed any speech therapy - "I have been talking for 65 years, I feel I know how to talk".  Offered handouts on strategies and exs. to improve the Dysarthria to include slowing down, over-articulating, getting bigger and louder in his speech. Wife and pt agreed. Handouts given w/ recommendation for pt to f/u w/ PCP if he desires any further f/u w/ ST services on an Outpt basis. Wife/pt agreed, NSG updated.    Orinda Kenner, [place], CCC-SLP Watson,Katherine 07/16/2017, 4:27 PM

## 2017-07-16 NOTE — Consult Note (Signed)
Referring Physician: Pyreddy    Chief Complaint: Slurred speech  HPI: Andersen Iorio is an 67 y.o. male who reports that he was out in the yard on 7/12 and feels he may have gotten too hot.  When he went in noted that his speech was slurred.  This was worse on 7/13.  Was convinced to com to the hospital on yesterday when his symptoms did not improve.  Initial NIHSS of  3.  Date last known well: Date: 07/12/2017 Time last known well: Unable to determine tPA Given: No: Outside time window  Past Medical History:  Diagnosis Date  . Arthritis   . Diabetes mellitus without complication ([place])   . High cholesterol   . Hypertension     Past Surgical History:  Procedure Laterality Date  . BUNIONECTOMY    . CHOLECYSTECTOMY    . COLONOSCOPY    . COLONOSCOPY WITH PROPOFOL N/A 12/17/2014   Procedure: COLONOSCOPY WITH PROPOFOL;  Surgeon: Lollie Sails, MD;  Location: [facility] ENDOSCOPY;  Service: Endoscopy;  Laterality: N/A;  . correct hammertoe    . JOINT REPLACEMENT    . KNEE ARTHROSCOPY Right   . TOTAL HIP ARTHROPLASTY    . TOTAL KNEE ARTHROPLASTY Right     Family History  Problem Relation Age of Onset  . Clotting disorder Father   . Hypertension Father    Social History:  reports that he has never smoked. He has quit using smokeless tobacco. His smokeless tobacco use included chew. He reports that he does not drink alcohol or use drugs.  Allergies:  Allergies  Allergen Reactions  . Clonidine Derivatives   . Fioricet [Butalbital-Apap-Caffeine]   . Hydrocodone-Acetaminophen   . Mobic [Meloxicam]   . Norvasc [Amlodipine Besylate]   . Statins   . Tramadol Itching    Medications:  I have reviewed the patient's current medications. Prior to Admission:  Medications Prior to Admission  Medication Sig Dispense Refill Last Dose  . carvedilol (COREG) 3.125 MG tablet Take 3.125 mg by mouth 2 (two) times daily.  5 07/15/2017 at 0800  . cyclobenzaprine (FLEXERIL) 5 MG tablet  Take 5-10 mg by mouth at bedtime as needed for muscle spasms.   PRN at PRN  . gabapentin (NEURONTIN) 300 MG capsule Take 300 mg by mouth 3 (three) times daily.   07/15/2017 at 0800  . glyBURIDE-metformin (GLUCOVANCE) 2.5-500 MG tablet Take 2 tablets by mouth 2 (two) times daily.   07/15/2017 at 0800  . lisinopril (PRINIVIL,ZESTRIL) 2.5 MG tablet Take 2.5 mg by mouth daily.   07/15/2017 at 0800  . sildenafil (REVATIO) 20 MG tablet Take 40-100 mg by mouth daily as needed.    PRN at PRN  . spironolactone (ALDACTONE) 25 MG tablet Take 25 mg by mouth daily.   07/15/2017 at 0800  . torsemide (DEMADEX) 20 MG tablet Take 60 mg by mouth daily.    12/16/2014 at Unknown time  . traZODone (DESYREL) 50 MG tablet Take 50-100 mg by mouth at bedtime.   PRN at PRN  . guaiFENesin-codeine 100-10 MG/5ML syrup Take 5 mLs by mouth every 6 (six) hours as needed for cough. (Patient not taking: Reported on 07/15/2017) 120 mL 0 Not Taking at Unknown time   Scheduled: . aspirin  300 mg Rectal Daily   Or  . aspirin  325 mg Oral Daily  . carvedilol  3.125 mg Oral BID  . enoxaparin (LOVENOX) injection  40 mg Subcutaneous Q24H  . gabapentin  300 mg Oral TID  .  insulin aspart  0-5 Units Subcutaneous QHS  . insulin aspart  0-9 Units Subcutaneous TID WC  . lisinopril  2.5 mg Oral Daily    ROS: History obtained from the patient  General ROS: negative for - chills, fatigue, fever, night sweats, weight gain or weight loss Psychological ROS: negative for - behavioral disorder, hallucinations, memory difficulties, mood swings or suicidal ideation Ophthalmic ROS: negative for - blurry vision, double vision, eye pain or loss of vision ENT ROS: negative for - epistaxis, nasal discharge, oral lesions, sore throat, tinnitus or vertigo Allergy and Immunology ROS: negative for - hives or itchy/watery eyes Hematological and Lymphatic ROS: negative for - bleeding problems, bruising or swollen lymph nodes Endocrine ROS: negative for -  galactorrhea, hair pattern changes, polydipsia/polyuria or temperature intolerance Respiratory ROS: negative for - cough, hemoptysis, shortness of breath or wheezing Cardiovascular ROS: LE edema Gastrointestinal ROS: negative for - abdominal pain, diarrhea, hematemesis, nausea/vomiting or stool incontinence Genito-Urinary ROS: negative for - dysuria, hematuria, incontinence or urinary frequency/urgency Musculoskeletal ROS: joint swelling and pain, back pain Neurological ROS: as noted in HPI Dermatological ROS: negative for rash and skin lesion changes  Physical Examination: Blood pressure (!) 155/95, pulse 73, temperature 97.6 F (36.4 C), temperature source Oral, resp. rate 18, height 5\' 9"  (1.753 m), weight 100.2 kg (221 lb), SpO2 96 %.  HEENT-  Normocephalic, no lesions, without obvious abnormality.  Normal external eye and conjunctiva.  Normal TM's bilaterally.  Normal auditory canals and external ears. Normal external nose, mucus membranes and septum.  Normal pharynx. Cardiovascular- S1, S2 normal, pulses palpable throughout   Lungs- chest clear, no wheezing, rales, normal symmetric air entry Abdomen- soft, non-tender; bowel sounds normal; no masses,  no organomegaly Extremities- BLE edema Lymph-no adenopathy palpable Musculoskeletal-arthritic joint changes Skin-warm and dry, no hyperpigmentation, vitiligo, or suspicious lesions  Neurological Examination   Mental Status: Alert, oriented, thought content appropriate.  Speech fluent without evidence of aphasia.  Dysarthria.  Able to follow 3 step commands without difficulty. Cranial Nerves: II: Discs flat bilaterally; Visual fields grossly normal, pupils equal, round, reactive to light and accommodation III,IV, VI: ptosis not present, extra-ocular motions intact bilaterally V,VII: right facial droop, facial light touch sensation decreased on the right VIII: hearing normal bilaterally IX,X: gag reflex present XI: bilateral shoulder  shrug XII: midline tongue extension Motor: Right : Upper extremity   5/5    Left:     Upper extremity   5/5  Lower extremity   5/5     Lower extremity   5/5 Tone and bulk:normal tone throughout; no atrophy noted Sensory: Pinprick and light touch intact throughout, bilaterally Deep Tendon Reflexes: 2+ in the upper extremities and absent in the lower extremities Plantars: Right: upgoing   Left: downgoing Cerebellar: Normal finger-to-nose and normal heel-to-shin testing bilaterally Gait: not tested due to safety concerns    Laboratory Studies:  Basic Metabolic Panel: Recent Labs  Lab 07/15/17 1236  NA 140  K 3.9  CL 102  CO2 28  GLUCOSE 159*  BUN 28*  CREATININE 1.16  CALCIUM 9.7    Liver Function Tests: Recent Labs  Lab 07/15/17 1236  AST 22  ALT 25  ALKPHOS 106  BILITOT 1.1  PROT 7.2  ALBUMIN 4.2   No results for input(s): LIPASE, AMYLASE in the last 168 hours. No results for input(s): AMMONIA in the last 168 hours.  CBC: Recent Labs  Lab 07/15/17 1236  WBC 7.3  NEUTROABS 5.1  HGB 16.3  HCT 46.8  MCV 94.9  PLT 174    Cardiac Enzymes: Recent Labs  Lab 07/15/17 1236  TROPONINI <0.03    BNP: Invalid input(s): POCBNP  CBG: Recent Labs  Lab 07/15/17 1652 07/15/17 2010 07/16/17 0742 07/16/17 1152  GLUCAP 113* 197* 147* 179*    Microbiology: No results found for this or any previous visit.  Coagulation Studies: Recent Labs    07/15/17 1236  LABPROT 12.8  INR 0.97    Urinalysis: No results for input(s): COLORURINE, LABSPEC, PHURINE, GLUCOSEU, HGBUR, BILIRUBINUR, KETONESUR, PROTEINUR, UROBILINOGEN, NITRITE, LEUKOCYTESUR in the last 168 hours.  Invalid input(s): APPERANCEUR  Lipid Panel:    Component Value Date/Time   CHOL 172 07/16/2017 0342   TRIG 194 (H) 07/16/2017 0342   HDL 25 (L) 07/16/2017 0342   CHOLHDL 6.9 07/16/2017 0342   VLDL 39 07/16/2017 0342   LDLCALC 108 (H) 07/16/2017 0342    HgbA1C:  Lab Results  Component  Value Date   HGBA1C 7.1 (H) 07/15/2017    Urine Drug Screen:  No results found for: LABOPIA, COCAINSCRNUR, LABBENZ, AMPHETMU, THCU, LABBARB  Alcohol Level: No results for input(s): ETH in the last 168 hours.  Other results: EKG: normal sinus rhythm at 73 bpm.  Imaging: Ct Head Wo Contrast  Result Date: 07/15/2017 CLINICAL DATA:  Patient reports on Saturday he started drooling. Wife reports noticing slurred speech yesterday 1300. Patient noted to have right sided facial droop. No anticoagulation. EXAM: CT HEAD WITHOUT CONTRAST TECHNIQUE: Contiguous axial images were obtained from the base of the skull through the vertex without intravenous contrast. COMPARISON:  None. FINDINGS: Brain: No evidence of acute infarction, hemorrhage, hydrocephalus, extra-axial collection or mass lesion/mass effect. There is ventricular and sulcal enlargement consistent with mild atrophy. Are small areas of white matter hypoattenuation are likely due to mild chronic microvascular ischemic change. Vascular: No hyperdense vessel or unexpected calcification. Skull: Normal. Negative for fracture or focal lesion. Sinuses/Orbits: Normal globes and orbits. Visualized sinuses and mastoid air cells are clear. Other: None. IMPRESSION: 1. No acute intracranial abnormalities. 2. Mild atrophy and chronic microvascular ischemic change. Electronically Signed   By: Lajean Manes M.D.   On: 07/15/2017 13:01   Mr Brain Wo Contrast  Result Date: 07/15/2017 CLINICAL DATA:  67 y/o M; history of hypertension, diabetes, hyperlipidemia presenting to the emergency room with acute onset of right facial droop, drooling, and slurred speech since Friday. EXAM: MRI HEAD WITHOUT CONTRAST MRA HEAD WITHOUT CONTRAST TECHNIQUE: Multiplanar, multiecho pulse sequences of the brain and surrounding structures were obtained without intravenous contrast. Angiographic images of the head were obtained using MRA technique without contrast. COMPARISON:  07/15/2017 CT  head FINDINGS: MRI HEAD FINDINGS Brain: 11 mm focus of reduced diffusion within left lateral frontal subcortical white matter and corona radiata compatible with acute/early subacute infarction. No associated hemorrhage or mass effect. 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 Mild chronic microvascular ischemic changes of white matter and parenchymal volume loss of the brain. Punctate focus of susceptibility hypointensity within right lateral thalamus compatible hemosiderin deposition of chronic microhemorrhage. No extra-axial collection, hydrocephalus, focal mass effect of the brain parenchyma, or herniation. Vascular: Normal flow voids. Skull and upper cervical spine: Normal marrow signal. Sinuses/Orbits: Mild mucosal thickening within the maxillary sinuses. No abnormal signal the mastoid air cells. Orbits are unremarkable. Other: None. MRA HEAD FINDINGS Mild motion artifact. Internal carotid arteries:  Patent. Anterior cerebral arteries:  Patent. Middle cerebral arteries: Patent. Anterior communicating artery: Probable small small caliber  patent vessel. Posterior communicating arteries: Patent right. No left identified, likely hypoplastic or absent. Posterior cerebral arteries:  Patent. Basilar artery:  Patent. Vertebral arteries:  Patent. No evidence of high-grade stenosis, large vessel occlusion, or aneurysm unless noted above. IMPRESSION: 1. 11 mm acute/early subacute infarction within left lateral frontal subcortical white matter and corona radiata. No associated hemorrhage or mass effect. 2. Patent intracranial circulation. No large vessel occlusion, aneurysm, or significant stenosis is identified. 3. 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 4. Mild chronic microvascular ischemic changes and parenchymal volume loss of the brain. These results will be called to the ordering clinician or representative by the Radiologist  Assistant, and communication documented in the PACS or zVision Dashboard. Electronically Signed   By: Kristine Garbe M.D.   On: 07/15/2017 22:54   US Carotid Bilateral (at [facility] And [facility] Only)  Result Date: 07/16/2017 CLINICAL DATA:  CVA. History of hypertension and diabetes. Former smoker. EXAM: BILATERAL CAROTID DUPLEX ULTRASOUND TECHNIQUE: Pearline Cables scale imaging, color Doppler and duplex ultrasound were performed of bilateral carotid and vertebral arteries in the neck. COMPARISON:  None. FINDINGS: Criteria: Quantification of carotid stenosis is based on velocity parameters that correlate the residual internal carotid diameter with NASCET-based stenosis levels, using the diameter of the distal internal carotid lumen as the denominator for stenosis measurement. The following velocity measurements were obtained: RIGHT ICA:  46/14 cm/sec CCA:  109/32 cm/sec SYSTOLIC ICA/CCA RATIO:  0.4 ECA:  75 cm/sec LEFT ICA:  69/27 cm/sec CCA:  35/57 cm/sec SYSTOLIC ICA/CCA RATIO:  1.1 ECA:  107 cm/sec RIGHT CAROTID ARTERY: There is a minimal amount of atherosclerotic plaque within the mid and distal aspects of the right common carotid artery (image 11). There is a minimal amount of atherosclerotic plaque within the left carotid bulb (image 16), extending to involve the origin and proximal aspects of the right internal carotid artery (image 24), not resulting in elevated peak systolic velocities within the right internal carotid artery to suggest a hemodynamically significant stenosis. RIGHT VERTEBRAL ARTERY:  Antegrade flow LEFT CAROTID ARTERY: There is a minimal amount of eccentric echogenic plaque within the mid (image 42) and distal (image 46) aspects of the left common carotid artery. There is a minimal amount of hypoechoic plaque involving the origin and proximal aspects of the left internal carotid artery (image 59), not resulting in elevated peak systolic velocities within the interrogated course the left internal  carotid artery to suggest a hemodynamically significant stenosis. 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 IMPRESSION: 1. Minimal amount of bilateral atherosclerotic plaque, right subjectively greater than left, not resulting in a hemodynamically significant stenosis within either internal carotid artery. 2. 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 Further evaluation with dedicated left upper extremity venous Doppler ultrasound could be performed as indicated. These results will be called to the ordering clinician or representative by the Radiologist Assistant, and communication documented in the PACS or zVision Dashboard. Electronically Signed   By: Sandi Mariscal M.D.   On: 07/16/2017 11:41   Mr Jodene Nam Head/brain DU Cm  Result Date: 07/15/2017 CLINICAL DATA:  67 y/o M; history of hypertension, diabetes, hyperlipidemia presenting to the emergency room with acute onset of right facial droop, drooling, and slurred speech since Friday. EXAM: MRI HEAD  WITHOUT CONTRAST MRA HEAD WITHOUT CONTRAST TECHNIQUE: Multiplanar, multiecho pulse sequences of the brain and surrounding structures were obtained without intravenous contrast. Angiographic images of the head were obtained using MRA technique without contrast. COMPARISON:  07/15/2017 CT head FINDINGS: MRI HEAD FINDINGS Brain: 11 mm focus of reduced diffusion within left lateral frontal subcortical white matter and corona radiata compatible with acute/early subacute infarction. No associated hemorrhage or mass effect. 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 Mild chronic microvascular ischemic changes of white matter and parenchymal volume loss of the brain. Punctate focus of  susceptibility hypointensity within right lateral thalamus compatible hemosiderin deposition of chronic microhemorrhage. No extra-axial collection, hydrocephalus, focal mass effect of the brain parenchyma, or herniation. Vascular: Normal flow voids. Skull and upper cervical spine: Normal marrow signal. Sinuses/Orbits: Mild mucosal thickening within the maxillary sinuses. No abnormal signal the mastoid air cells. Orbits are unremarkable. Other: None. MRA HEAD FINDINGS Mild motion artifact. Internal carotid arteries:  Patent. Anterior cerebral arteries:  Patent. Middle cerebral arteries: Patent. Anterior communicating artery: Probable small small caliber patent vessel. Posterior communicating arteries: Patent right. No left identified, likely hypoplastic or absent. Posterior cerebral arteries:  Patent. Basilar artery:  Patent. Vertebral arteries:  Patent. No evidence of high-grade stenosis, large vessel occlusion, or aneurysm unless noted above. IMPRESSION: 1. 11 mm acute/early subacute infarction within left lateral frontal subcortical white matter and corona radiata. No associated hemorrhage or mass effect. 2. Patent intracranial circulation. No large vessel occlusion, aneurysm, or significant stenosis is identified. 3. 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 4. Mild chronic microvascular ischemic changes and parenchymal volume loss of the brain. These results will be called to the ordering clinician or representative by the Radiologist Assistant, and communication documented in the PACS or zVision Dashboard. Electronically Signed   By: Kristine Garbe M.D.   On: 07/15/2017 22:54    Assessment: 67 y.o. male presenting with right facial droop and slurred speech.  MRI of the brain reviewed and shows an acute left frontal subcortical infarct.  Etiology likely small vessel disease.  Patient on no antiplatelet therapy prior to admission.  Carotid dopplers show no  evidence of hemodynamically significant stenosis.  Echocardiogram pending.  A1c 7.1, LDL 108.  Stroke Risk Factors - diabetes mellitus, hyperlipidemia and hypertension  Plan: 1. PT consult, OT consult, Speech consult 2. Echocardiogram pending 3. Prophylactic therapy-Antiplatelet med: Aspirin - dose 325mg  daily 4. NPO until RN stroke swallow screen 5. Telemetry monitoring 6. Frequent neuro checks 7. Statin for lipid management with target LDL<70. 8. Blood sugar management with target A1c<7.0    Alexis Goodell, MD Neurology [phone] 07/16/2017, 1:36 PM

## 2017-07-16 NOTE — Progress Notes (Signed)
PT Cancellation Note  Patient Details Name: Meredith Mells MRN: [id number] DOB: 12-10-1950   Cancelled Treatment:    Reason Eval/Treat Not Completed: Other (comment).  PT consult received.  Chart reviewed.  Pt pending L UE venous US to r/o DVT.  Will hold PT at this time until results of imaging is known and pt is medically appropriate to participate in PT.  Leitha Bleak, PT 07/16/17, 12:24 PM [phone]

## 2017-07-16 NOTE — Progress Notes (Signed)
[facility] NAME: Michael Humphrey    MR#:  [id number]  DATE OF BIRTH:  1950-08-12  SUBJECTIVE:  CHIEF COMPLAINT:   Chief Complaint  Patient presents with  . Cerebrovascular Accident  Patient seen today Improved speech No numbness  REVIEW OF SYSTEMS:    ROS  CONSTITUTIONAL: No documented fever. No fatigue, weakness. No weight gain, no weight loss.  EYES: No blurry or double vision.  ENT: No tinnitus. No postnasal drip. No redness of the oropharynx.  RESPIRATORY: No cough, no wheeze, no hemoptysis. No dyspnea.  CARDIOVASCULAR: No chest pain. No orthopnea. No palpitations. No syncope.  GASTROINTESTINAL: No nausea, no vomiting or diarrhea. No abdominal pain. No melena or hematochezia.  GENITOURINARY: No dysuria or hematuria.  ENDOCRINE: No polyuria or nocturia. No heat or cold intolerance.  HEMATOLOGY: No anemia. No bruising. No bleeding.  INTEGUMENTARY: No rashes. No lesions.  MUSCULOSKELETAL: No arthritis. No swelling. No gout.  NEUROLOGIC: No numbness, tingling, or ataxia. No seizure-type activity.  Speech slurred PSYCHIATRIC: No anxiety. No insomnia. No ADD.   DRUG ALLERGIES:   Allergies  Allergen Reactions  . Clonidine Derivatives   . Fioricet [Butalbital-Apap-Caffeine]   . Hydrocodone-Acetaminophen   . Mobic [Meloxicam]   . Norvasc [Amlodipine Besylate]   . Statins   . Tramadol Itching    VITALS:  Blood pressure (!) 155/95, pulse 73, temperature 97.6 F (36.4 C), temperature source Oral, resp. rate 18, height 5\' 9"  (1.753 m), weight 100.2 kg (221 lb), SpO2 96 %.  PHYSICAL EXAMINATION:   Physical Exam  GENERAL:  67 y.o.-year-old patient lying in the bed with no acute distress.  EYES: Pupils equal, round, reactive to light and accommodation. No scleral icterus. Extraocular muscles intact.  HEENT: Head atraumatic, normocephalic. Oropharynx and nasopharynx clear.  NECK:  Supple, no jugular venous distention. No  thyroid enlargement, no tenderness.  LUNGS: Normal breath sounds bilaterally, no wheezing, rales, rhonchi. No use of accessory muscles of respiration.  CARDIOVASCULAR: S1, S2 normal. No murmurs, rubs, or gallops.  ABDOMEN: Soft, nontender, nondistended. Bowel sounds present. No organomegaly or mass.  EXTREMITIES: No cyanosis, clubbing or edema b/l.    NEUROLOGIC: Cranial nerves II through XII are intact. No focal Motor or sensory deficits b/l.   PSYCHIATRIC: The patient is alert and oriented x 3.  SKIN: No obvious rash, lesion, or ulcer.   LABORATORY PANEL:   CBC Recent Labs  Lab 07/15/17 1236  WBC 7.3  HGB 16.3  HCT 46.8  PLT 174   ------------------------------------------------------------------------------------------------------------------ Chemistries  Recent Labs  Lab 07/15/17 1236  NA 140  K 3.9  CL 102  CO2 28  GLUCOSE 159*  BUN 28*  CREATININE 1.16  CALCIUM 9.7  AST 22  ALT 25  ALKPHOS 106  BILITOT 1.1   ------------------------------------------------------------------------------------------------------------------  Cardiac Enzymes Recent Labs  Lab 07/15/17 1236  TROPONINI <0.03   ------------------------------------------------------------------------------------------------------------------  RADIOLOGY:  Ct Head Wo Contrast  Result Date: 07/15/2017 CLINICAL DATA:  Patient reports on Saturday he started drooling. Wife reports noticing slurred speech yesterday 1300. Patient noted to have right sided facial droop. No anticoagulation. EXAM: CT HEAD WITHOUT CONTRAST TECHNIQUE: Contiguous axial images were obtained from the base of the skull through the vertex without intravenous contrast. COMPARISON:  None. FINDINGS: Brain: No evidence of acute infarction, hemorrhage, hydrocephalus, extra-axial collection or mass lesion/mass effect. There is ventricular and sulcal enlargement consistent with mild atrophy. Are small areas of white matter hypoattenuation are  likely due to  mild chronic microvascular ischemic change. Vascular: No hyperdense vessel or unexpected calcification. Skull: Normal. Negative for fracture or focal lesion. Sinuses/Orbits: Normal globes and orbits. Visualized sinuses and mastoid air cells are clear. Other: None. IMPRESSION: 1. No acute intracranial abnormalities. 2. Mild atrophy and chronic microvascular ischemic change. Electronically Signed   By: Lajean Manes M.D.   On: 07/15/2017 13:01   Mr Brain Wo Contrast  Result Date: 07/15/2017 CLINICAL DATA:  67 y/o M; history of hypertension, diabetes, hyperlipidemia presenting to the emergency room with acute onset of right facial droop, drooling, and slurred speech since Friday. EXAM: MRI HEAD WITHOUT CONTRAST MRA HEAD WITHOUT CONTRAST TECHNIQUE: Multiplanar, multiecho pulse sequences of the brain and surrounding structures were obtained without intravenous contrast. Angiographic images of the head were obtained using MRA technique without contrast. COMPARISON:  07/15/2017 CT head FINDINGS: MRI HEAD FINDINGS Brain: 11 mm focus of reduced diffusion within left lateral frontal subcortical white matter and corona radiata compatible with acute/early subacute infarction. No associated hemorrhage or mass effect. 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 Mild chronic microvascular ischemic changes of white matter and parenchymal volume loss of the brain. Punctate focus of susceptibility hypointensity within right lateral thalamus compatible hemosiderin deposition of chronic microhemorrhage. No extra-axial collection, hydrocephalus, focal mass effect of the brain parenchyma, or herniation. Vascular: Normal flow voids. Skull and upper cervical spine: Normal marrow signal. Sinuses/Orbits: Mild mucosal thickening within the maxillary sinuses. No abnormal signal the mastoid air cells. Orbits are unremarkable. Other: None. MRA HEAD FINDINGS Mild motion artifact.  Internal carotid arteries:  Patent. Anterior cerebral arteries:  Patent. Middle cerebral arteries: Patent. Anterior communicating artery: Probable small small caliber patent vessel. Posterior communicating arteries: Patent right. No left identified, likely hypoplastic or absent. Posterior cerebral arteries:  Patent. Basilar artery:  Patent. Vertebral arteries:  Patent. No evidence of high-grade stenosis, large vessel occlusion, or aneurysm unless noted above. IMPRESSION: 1. 11 mm acute/early subacute infarction within left lateral frontal subcortical white matter and corona radiata. No associated hemorrhage or mass effect. 2. Patent intracranial circulation. No large vessel occlusion, aneurysm, or significant stenosis is identified. 3. 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 4. Mild chronic microvascular ischemic changes and parenchymal volume loss of the brain. These results will be called to the ordering clinician or representative by the Radiologist Assistant, and communication documented in the PACS or zVision Dashboard. Electronically Signed   By: Kristine Garbe M.D.   On: 07/15/2017 22:54   US Carotid Bilateral (at [facility] Only)  Result Date: 07/16/2017 CLINICAL DATA:  CVA. History of hypertension and diabetes. Former smoker. EXAM: BILATERAL CAROTID DUPLEX ULTRASOUND TECHNIQUE: Pearline Cables scale imaging, color Doppler and duplex ultrasound were performed of bilateral carotid and vertebral arteries in the neck. COMPARISON:  None. FINDINGS: Criteria: Quantification of carotid stenosis is based on velocity parameters that correlate the residual internal carotid diameter with NASCET-based stenosis levels, using the diameter of the distal internal carotid lumen as the denominator for stenosis measurement. The following velocity measurements were obtained: RIGHT ICA:  46/14 cm/sec CCA:  712/45 cm/sec SYSTOLIC ICA/CCA RATIO:  0.4 ECA:  75 cm/sec LEFT ICA:  69/27  cm/sec CCA:  80/99 cm/sec SYSTOLIC ICA/CCA RATIO:  1.1 ECA:  107 cm/sec RIGHT CAROTID ARTERY: There is a minimal amount of atherosclerotic plaque within the mid and distal aspects of the right common carotid artery (image 11). There is a minimal amount of atherosclerotic plaque  within the left carotid bulb (image 16), extending to involve the origin and proximal aspects of the right internal carotid artery (image 24), not resulting in elevated peak systolic velocities within the right internal carotid artery to suggest a hemodynamically significant stenosis. RIGHT VERTEBRAL ARTERY:  Antegrade flow LEFT CAROTID ARTERY: There is a minimal amount of eccentric echogenic plaque within the mid (image 42) and distal (image 46) aspects of the left common carotid artery. There is a minimal amount of hypoechoic plaque involving the origin and proximal aspects of the left internal carotid artery (image 59), not resulting in elevated peak systolic velocities within the interrogated course the left internal carotid artery to suggest a hemodynamically significant stenosis. 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 IMPRESSION: 1. Minimal amount of bilateral atherosclerotic plaque, right subjectively greater than left, not resulting in a hemodynamically significant stenosis within either internal carotid artery. 2. 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 Further evaluation with dedicated left upper extremity venous Doppler ultrasound could be performed as indicated. These results will be called to the ordering clinician or representative by the Radiologist Assistant, and communication documented in the PACS or zVision Dashboard. Electronically Signed   By: Sandi Mariscal M.D.    On: 07/16/2017 11:41   US Venous Img Upper Uni Left  Result Date: 07/16/2017 CLINICAL DATA:  Nonocclusive thrombus incidentally noted on carotid Doppler ultrasound performed earlier same day. EXAM: LEFT UPPER EXTREMITY VENOUS DOPPLER ULTRASOUND TECHNIQUE: Gray-scale sonography with graded compression, as well as color Doppler and duplex ultrasound were performed to evaluate the upper extremity deep venous system from the level of the subclavian vein and including the jugular, axillary, basilic, radial, ulnar and upper cephalic vein. Spectral Doppler was utilized to evaluate flow at rest and with distal augmentation maneuvers. COMPARISON:  None. FINDINGS: Contralateral Subclavian Vein: Respiratory phasicity is normal and symmetric with the symptomatic side. No evidence of thrombus. Normal compressibility. Internal Jugular Vein: No evidence of thrombus. Normal compressibility, respiratory phasicity and response to augmentation. Subclavian Vein: There is a minimal amount of hypoechoic nonocclusive wall thickening/DVT within the left internal jugular vein (images 4 and 8). Axillary Vein: No evidence of thrombus. Normal compressibility, respiratory phasicity and response to augmentation. Cephalic Vein: No evidence of thrombus. Normal compressibility, respiratory phasicity and response to augmentation. Basilic Vein: No evidence of thrombus. Normal compressibility, respiratory phasicity and response to augmentation. Brachial Veins: No evidence of thrombus. Normal compressibility, respiratory phasicity and response to augmentation. Radial Veins: No evidence of thrombus. Normal compressibility, respiratory phasicity and response to augmentation. Ulnar Veins: No evidence of thrombus. Normal compressibility, respiratory phasicity and response to augmentation. Venous Reflux:  None visualized. Other Findings:  None visualized. IMPRESSION: 1. 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 2. The remainder of the left upper extremity venous system appears widely patent without evidence of acute or chronic DVT or SVT. Electronically Signed   By: Sandi Mariscal M.D.   On: 07/16/2017 14:46   Mr Jodene Nam Head/brain HG Cm  Result Date: 07/15/2017 CLINICAL DATA:  67 y/o M; history of hypertension, diabetes, hyperlipidemia presenting to the emergency room with acute onset of right facial droop, drooling, and slurred speech since Friday. EXAM: MRI HEAD  WITHOUT CONTRAST MRA HEAD WITHOUT CONTRAST TECHNIQUE: Multiplanar, multiecho pulse sequences of the brain and surrounding structures were obtained without intravenous contrast. Angiographic images of the head were obtained using MRA technique without contrast. COMPARISON:  07/15/2017 CT head FINDINGS: MRI HEAD FINDINGS Brain: 11 mm focus of reduced diffusion within left lateral frontal subcortical white matter and corona radiata compatible with acute/early subacute infarction. No associated hemorrhage or mass effect. 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 Mild chronic microvascular ischemic changes of white matter and parenchymal volume loss of the brain. Punctate focus of susceptibility hypointensity within right lateral thalamus compatible hemosiderin deposition of chronic microhemorrhage. No extra-axial collection, hydrocephalus, focal mass effect of the brain parenchyma, or herniation. Vascular: Normal flow voids. Skull and upper cervical spine: Normal marrow signal. Sinuses/Orbits: Mild mucosal thickening within the maxillary sinuses. No abnormal signal the mastoid air cells. Orbits are unremarkable. Other: None. MRA HEAD FINDINGS Mild motion artifact. Internal carotid arteries:  Patent. Anterior cerebral arteries:  Patent. Middle cerebral arteries: Patent. Anterior communicating artery: Probable small  small caliber patent vessel. Posterior communicating arteries: Patent right. No left identified, likely hypoplastic or absent. Posterior cerebral arteries:  Patent. Basilar artery:  Patent. Vertebral arteries:  Patent. No evidence of high-grade stenosis, large vessel occlusion, or aneurysm unless noted above. IMPRESSION: 1. 11 mm acute/early subacute infarction within left lateral frontal subcortical white matter and corona radiata. No associated hemorrhage or mass effect. 2. Patent intracranial circulation. No large vessel occlusion, aneurysm, or significant stenosis is identified. 3. 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 4. Mild chronic microvascular ischemic changes and parenchymal volume loss of the brain. These results will be called to the ordering clinician or representative by the Radiologist Assistant, and communication documented in the PACS or zVision Dashboard. Electronically Signed   By: Kristine Garbe M.D.   On: 07/15/2017 22:54     ASSESSMENT AND PLAN:   67 year old male patient with history of diabetes mellitus type 2, hyperlipidemia, hypertension, arthritis presented to the emergency room with slurred speech, facial droop  -Acute CVA PT, OT services Speech therapy to continue Continue aspirin Patient allergic to statin Status post neurology evaluation  -Nonocclusive deep venous thrombosis internal jugular vein Vascular surgery consult  -Type 2 diabetes mellitus Medical management for diabetes mellitus  -DVT prophylaxis subcu Lovenox daily  All the records are reviewed and case discussed with Care Management/Social Worker. Management plans discussed with the patient, family and they are in agreement.  CODE STATUS: Full code  DVT Prophylaxis: SCDs  TOTAL TIME TAKING CARE OF THIS PATIENT: 36 minutes.   POSSIBLE D/C IN 1 DAYS, DEPENDING ON CLINICAL CONDITION.  Saundra Shelling M.D on 07/16/2017 at 3:13 PM  Between 7am  to 6pm - Pager - [phone]  After 6pm go to [email or web address] - password [email or web address] [facility]  Office  [phone]  CC: Primary care physician; Kirk Ruths, MD  Note: This dictation was prepared with Dragon dictation along with smaller phrase technology. Any transcriptional errors that result from this process are unintentional.

## 2017-07-17 DIAGNOSIS — I10 Essential (primary) hypertension: Secondary | ICD-10-CM

## 2017-07-17 DIAGNOSIS — I82409 Acute embolism and thrombosis of unspecified deep veins of unspecified lower extremity: Secondary | ICD-10-CM

## 2017-07-17 DIAGNOSIS — E119 Type 2 diabetes mellitus without complications: Secondary | ICD-10-CM

## 2017-07-17 DIAGNOSIS — I6789 Other cerebrovascular disease: Secondary | ICD-10-CM

## 2017-07-17 LAB — GLUCOSE, CAPILLARY
Glucose-Capillary: 176 mg/dL — ABNORMAL HIGH (ref 70–99)
Glucose-Capillary: 215 mg/dL — ABNORMAL HIGH (ref 70–99)

## 2017-07-17 LAB — HIV ANTIBODY (ROUTINE TESTING W REFLEX): HIV SCREEN 4TH GENERATION: NONREACTIVE

## 2017-07-17 MED ORDER — ROSUVASTATIN CALCIUM 20 MG PO TABS
20.0000 mg | ORAL_TABLET | Freq: Every day | ORAL | Status: DC
  Administered 2017-07-17: 13:00:00 20 mg via ORAL
  Filled 2017-07-17: qty 1

## 2017-07-17 MED ORDER — TRAZODONE HCL 100 MG PO TABS: 100.0000 mg | ORAL_TABLET | Freq: Every evening | ORAL | 0 refills | Status: DC | PRN

## 2017-07-17 MED ORDER — ASPIRIN 325 MG PO TABS: 325.0000 mg | ORAL_TABLET | Freq: Every day | ORAL | 0 refills | Status: AC

## 2017-07-17 MED ORDER — ROSUVASTATIN CALCIUM 20 MG PO TABS: 20.0000 mg | ORAL_TABLET | Freq: Every day | ORAL | 0 refills | Status: DC

## 2017-07-17 NOTE — Discharge Summary (Addendum)
[facility] [place] NAME: Michael Humphrey    MR#:  [id number]  DATE OF BIRTH:  1950/03/01  DATE OF ADMISSION:  07/15/2017 ADMITTING PHYSICIAN: Hillary Bow, MD  DATE OF DISCHARGE: 07/17/2017  PRIMARY CARE PHYSICIAN: Kirk Ruths, MD   ADMISSION DIAGNOSIS:  CVA (cerebral vascular accident) [facility]) [I63.9] Cerebrovascular accident (CVA), unspecified mechanism ([place]) [I63.9] Dysarthria DISCHARGE DIAGNOSIS:  Active Problems:   CVA (cerebral vascular accident) ([place]) Left frontal and corona radiata CVA Dysarthria Type 2 diabetes mellitus Hyperlipidemia Nonocclusive DVT left internal jugular vein  SECONDARY DIAGNOSIS:   Past Medical History:  Diagnosis Date  . Arthritis   . Diabetes mellitus without complication ([place])   . High cholesterol   . Hypertension      ADMITTING HISTORY Michael Humphrey  is a 67 y.o. male with a known history of hypertension, diabetes, hyperlipidemia presents to the emergency room with acute onset of right facial droop, drooling and slurred speech since Friday.  Symptoms have been there for close to 48 hours.  Outside TPA window.  CT scan of the head normal.  No focal weakness or numbness.  Patient is being admitted for further work-up of CVA.  Blood pressure is elevated. Bedside swallow screen normal  HOSPITAL COURSE:  Patient admitted to medical floor.  Was seen by neurology attending during the hospitalization.  Patient received aspirin and Crestor during the hospitalization.  Patient was worked up with CT head, MRI and MRA brain which showed acute CVA.  He had carotid ultrasound which showed no significant stenosis but nonocclusive DVT in the internal jugular vein in the left side.  Patient also had work-up with left upper extremity Doppler venous ultrasound which showed no DVT. consult was done with vascular surgery also for nonocclusive DVT in the internal jugular vein.  They recommended only aspirin and no  anticoagulation.  No surgical intervention recommended.  Patient will be discharged home with home health services, physical therapy, occupational therapy and speech therapy.  CONSULTS OBTAINED:  Treatment Team:  Alexis Goodell, MD Algernon Huxley, MD  DRUG ALLERGIES:   Allergies  Allergen Reactions  . Clonidine Derivatives   . Fioricet [Butalbital-Apap-Caffeine]   . Hydrocodone-Acetaminophen   . Mobic [Meloxicam]   . Norvasc [Amlodipine Besylate]   . Statins     Pt does not remember being allergic to any statins  . Tramadol Itching    DISCHARGE MEDICATIONS:   Allergies as of 07/17/2017      Reactions   Clonidine Derivatives    Fioricet [butalbital-apap-caffeine]    Hydrocodone-acetaminophen    Mobic [meloxicam]    Norvasc [amlodipine Besylate]    Statins    Pt does not remember being allergic to any statins   Tramadol Itching      Medication List    STOP taking these medications   sildenafil 20 MG tablet Commonly known as:  REVATIO     TAKE these medications   aspirin 325 MG tablet Take 1 tablet (325 mg total) by mouth daily. Start taking on:  07/18/2017   carvedilol 3.125 MG tablet Commonly known as:  COREG Take 3.125 mg by mouth 2 (two) times daily.   cyclobenzaprine 5 MG tablet Commonly known as:  FLEXERIL Take 5-10 mg by mouth at bedtime as needed for muscle spasms.   gabapentin 300 MG capsule Commonly known as:  NEURONTIN Take 300 mg by mouth 3 (three) times daily.   glyBURIDE-metformin 2.5-500 MG tablet Commonly known as:  GLUCOVANCE Take  2 tablets by mouth 2 (two) times daily.   guaiFENesin-codeine 100-10 MG/5ML syrup Take 5 mLs by mouth every 6 (six) hours as needed for cough.   lisinopril 2.5 MG tablet Commonly known as:  PRINIVIL,ZESTRIL Take 2.5 mg by mouth daily.   rosuvastatin 20 MG tablet Commonly known as:  CRESTOR Take 1 tablet (20 mg total) by mouth daily. Start taking on:  07/18/2017   spironolactone 25 MG tablet Commonly  known as:  ALDACTONE Take 25 mg by mouth daily.   torsemide 20 MG tablet Commonly known as:  DEMADEX Take 60 mg by mouth daily.   traZODone 100 MG tablet Commonly known as:  DESYREL Take 1 tablet (100 mg total) by mouth at bedtime as needed for sleep. What changed:    medication strength  how much to take  when to take this  reasons to take this       Today  Patient seen today Tolerating diet well No weakness, numbness  VITAL SIGNS:  Blood pressure (!) 152/79, pulse 71, temperature 97.6 F (36.4 C), temperature source Oral, resp. rate 18, height 5\' 9"  (1.753 m), weight 100.2 kg (221 lb), SpO2 97 %.  I/O:    Intake/Output Summary (Last 24 hours) at 07/17/2017 1419 Last data filed at 07/16/2017 1911 Gross per 24 hour  Intake 240 ml  Output -  Net 240 ml    PHYSICAL EXAMINATION:  Physical Exam  GENERAL:  67 y.o.-year-old patient lying in the bed with no acute distress.  LUNGS: Normal breath sounds bilaterally, no wheezing, rales,rhonchi or crepitation. No use of accessory muscles of respiration.  CARDIOVASCULAR: S1, S2 normal. No murmurs, rubs, or gallops.  ABDOMEN: Soft, non-tender, non-distended. Bowel sounds present. No organomegaly or mass.  NEUROLOGIC: Moves all 4 extremities. PSYCHIATRIC: The patient is alert and oriented x 3.  SKIN: No obvious rash, lesion, or ulcer.   DATA REVIEW:   CBC Recent Labs  Lab 07/15/17 1236  WBC 7.3  HGB 16.3  HCT 46.8  PLT 174    Chemistries  Recent Labs  Lab 07/15/17 1236  NA 140  K 3.9  CL 102  CO2 28  GLUCOSE 159*  BUN 28*  CREATININE 1.16  CALCIUM 9.7  AST 22  ALT 25  ALKPHOS 106  BILITOT 1.1    Cardiac Enzymes Recent Labs  Lab 07/15/17 1236  TROPONINI <0.03    Microbiology Results  No results found for this or any previous visit.  RADIOLOGY:  Mr Brain 37 Contrast  Result Date: 07/15/2017 CLINICAL DATA:  67 y/o M; history of hypertension, diabetes, hyperlipidemia presenting to the  emergency room with acute onset of right facial droop, drooling, and slurred speech since Friday. EXAM: MRI HEAD WITHOUT CONTRAST MRA HEAD WITHOUT CONTRAST TECHNIQUE: Multiplanar, multiecho pulse sequences of the brain and surrounding structures were obtained without intravenous contrast. Angiographic images of the head were obtained using MRA technique without contrast. COMPARISON:  07/15/2017 CT head FINDINGS: MRI HEAD FINDINGS Brain: 11 mm focus of reduced diffusion within left lateral frontal subcortical white matter and corona radiata compatible with acute/early subacute infarction. No associated hemorrhage or mass effect. 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 Mild chronic microvascular ischemic changes of white matter and parenchymal volume loss of the brain. Punctate focus of susceptibility hypointensity within right lateral thalamus compatible hemosiderin deposition of chronic microhemorrhage. No extra-axial collection, hydrocephalus, focal mass effect of the brain parenchyma, or herniation. Vascular: Normal flow voids. Skull and upper  cervical spine: Normal marrow signal. Sinuses/Orbits: Mild mucosal thickening within the maxillary sinuses. No abnormal signal the mastoid air cells. Orbits are unremarkable. Other: None. MRA HEAD FINDINGS Mild motion artifact. Internal carotid arteries:  Patent. Anterior cerebral arteries:  Patent. Middle cerebral arteries: Patent. Anterior communicating artery: Probable small small caliber patent vessel. Posterior communicating arteries: Patent right. No left identified, likely hypoplastic or absent. Posterior cerebral arteries:  Patent. Basilar artery:  Patent. Vertebral arteries:  Patent. No evidence of high-grade stenosis, large vessel occlusion, or aneurysm unless noted above. IMPRESSION: 1. 11 mm acute/early subacute infarction within left lateral frontal subcortical white matter and corona radiata. No associated  hemorrhage or mass effect. 2. Patent intracranial circulation. No large vessel occlusion, aneurysm, or significant stenosis is identified. 3. 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 4. Mild chronic microvascular ischemic changes and parenchymal volume loss of the brain. These results will be called to the ordering clinician or representative by the Radiologist Assistant, and communication documented in the PACS or zVision Dashboard. Electronically Signed   By: Kristine Garbe M.D.   On: 07/15/2017 22:54   US Carotid Bilateral (at [facility] Only)  Result Date: 07/16/2017 CLINICAL DATA:  CVA. History of hypertension and diabetes. Former smoker. EXAM: BILATERAL CAROTID DUPLEX ULTRASOUND TECHNIQUE: Pearline Cables scale imaging, color Doppler and duplex ultrasound were performed of bilateral carotid and vertebral arteries in the neck. COMPARISON:  None. FINDINGS: Criteria: Quantification of carotid stenosis is based on velocity parameters that correlate the residual internal carotid diameter with NASCET-based stenosis levels, using the diameter of the distal internal carotid lumen as the denominator for stenosis measurement. The following velocity measurements were obtained: RIGHT ICA:  46/14 cm/sec CCA:  595/63 cm/sec SYSTOLIC ICA/CCA RATIO:  0.4 ECA:  75 cm/sec LEFT ICA:  69/27 cm/sec CCA:  87/56 cm/sec SYSTOLIC ICA/CCA RATIO:  1.1 ECA:  107 cm/sec RIGHT CAROTID ARTERY: There is a minimal amount of atherosclerotic plaque within the mid and distal aspects of the right common carotid artery (image 11). There is a minimal amount of atherosclerotic plaque within the left carotid bulb (image 16), extending to involve the origin and proximal aspects of the right internal carotid artery (image 24), not resulting in elevated peak systolic velocities within the right internal carotid artery to suggest a hemodynamically significant stenosis. RIGHT VERTEBRAL ARTERY:  Antegrade flow  LEFT CAROTID ARTERY: There is a minimal amount of eccentric echogenic plaque within the mid (image 42) and distal (image 46) aspects of the left common carotid artery. There is a minimal amount of hypoechoic plaque involving the origin and proximal aspects of the left internal carotid artery (image 59), not resulting in elevated peak systolic velocities within the interrogated course the left internal carotid artery to suggest a hemodynamically significant stenosis. 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 IMPRESSION: 1. Minimal amount of bilateral atherosclerotic plaque, right subjectively greater than left, not resulting in a hemodynamically significant stenosis within either internal carotid artery. 2. 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 Further evaluation with dedicated left upper extremity venous Doppler ultrasound could be performed as indicated. These results will be called to the ordering clinician or representative by the Radiologist Assistant, and communication documented in the PACS or zVision Dashboard. Electronically Signed   By:  Sandi Mariscal M.D.   On: 07/16/2017 11:41   US Venous Img Upper Uni Left  Result Date: 07/16/2017 CLINICAL DATA:  Nonocclusive thrombus incidentally noted on carotid Doppler ultrasound performed earlier same day. EXAM: LEFT UPPER EXTREMITY VENOUS DOPPLER ULTRASOUND TECHNIQUE: Gray-scale sonography with graded compression, as well as color Doppler and duplex ultrasound were performed to evaluate the upper extremity deep venous system from the level of the subclavian vein and including the jugular, axillary, basilic, radial, ulnar and upper cephalic vein. Spectral Doppler was utilized to evaluate flow at rest and with  distal augmentation maneuvers. COMPARISON:  None. FINDINGS: Contralateral Subclavian Vein: Respiratory phasicity is normal and symmetric with the symptomatic side. No evidence of thrombus. Normal compressibility. Internal Jugular Vein: No evidence of thrombus. Normal compressibility, respiratory phasicity and response to augmentation. Subclavian Vein: There is a minimal amount of hypoechoic nonocclusive wall thickening/DVT within the left internal jugular vein (images 4 and 8). Axillary Vein: No evidence of thrombus. Normal compressibility, respiratory phasicity and response to augmentation. Cephalic Vein: No evidence of thrombus. Normal compressibility, respiratory phasicity and response to augmentation. Basilic Vein: No evidence of thrombus. Normal compressibility, respiratory phasicity and response to augmentation. Brachial Veins: No evidence of thrombus. Normal compressibility, respiratory phasicity and response to augmentation. Radial Veins: No evidence of thrombus. Normal compressibility, respiratory phasicity and response to augmentation. Ulnar Veins: No evidence of thrombus. Normal compressibility, respiratory phasicity and response to augmentation. Venous Reflux:  None visualized. Other Findings:  None visualized. IMPRESSION: 1. 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 2. The remainder of the left upper extremity venous system appears widely patent without evidence of acute or chronic DVT or SVT. Electronically Signed   By: Sandi Mariscal M.D.   On: 07/16/2017 14:46   Mr Jodene Nam Head/brain MO Cm  Result Date: 07/15/2017 CLINICAL DATA:  67 y/o M; history of hypertension, diabetes, hyperlipidemia presenting to the emergency room with acute onset of right facial droop, drooling, and slurred speech since Friday. EXAM: MRI HEAD WITHOUT CONTRAST MRA HEAD WITHOUT  CONTRAST TECHNIQUE: Multiplanar, multiecho pulse sequences of the brain and surrounding structures were obtained without intravenous contrast. Angiographic images of the head were obtained using MRA technique without contrast. COMPARISON:  07/15/2017 CT head FINDINGS: MRI HEAD FINDINGS Brain: 11 mm focus of reduced diffusion within left lateral frontal subcortical white matter and corona radiata compatible with acute/early subacute infarction. No associated hemorrhage or mass effect. 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 Mild chronic microvascular ischemic changes of white matter and parenchymal volume loss of the brain. Punctate focus of susceptibility hypointensity within right lateral thalamus compatible hemosiderin deposition of chronic microhemorrhage. No extra-axial collection, hydrocephalus, focal mass effect of the brain parenchyma, or herniation. Vascular: Normal flow voids. Skull and upper cervical spine: Normal marrow signal. Sinuses/Orbits: Mild mucosal thickening within the maxillary sinuses. No abnormal signal the mastoid air cells. Orbits are unremarkable. Other: None. MRA HEAD FINDINGS Mild motion artifact. Internal carotid arteries:  Patent. Anterior cerebral arteries:  Patent. Middle cerebral arteries: Patent. Anterior communicating artery: Probable small small caliber patent vessel. Posterior communicating arteries: Patent right. No left identified, likely hypoplastic or absent. Posterior cerebral arteries:  Patent. Basilar artery:  Patent. Vertebral arteries:  Patent. No evidence of high-grade stenosis, large vessel occlusion, or aneurysm unless noted above. IMPRESSION: 1. 11 mm acute/early subacute infarction within left lateral frontal subcortical white  matter and corona radiata. No associated hemorrhage or mass effect. 2. Patent intracranial circulation. No large vessel occlusion, aneurysm, or significant stenosis is identified. 3. Small chronic  infarct within the right mid corona radiata and right caudate head. 4. Mild chronic microvascular ischemic changes and parenchymal volume loss of the brain. These results will be called to the ordering clinician or representative by the Radiologist Assistant, and communication documented in the PACS or zVision Dashboard. Electronically Signed   By: Kristine Garbe M.D.   On: 07/15/2017 22:54    Follow up with PCP in 1 week.  Management plans discussed with the patient, family and they are in agreement.  CODE STATUS: Full code    Code Status Orders  (From admission, onward)        Start     Ordered   07/15/17 1535  Full code  Continuous     07/15/17 1536    Code Status History    This patient has a current code status but no historical code status.    Advance Directive Documentation     Most Recent Value  Type of Advance Directive  Living will  Pre-existing out of facility DNR order (yellow form or pink MOST form)  -  "MOST" Form in Place?  -      TOTAL TIME TAKING CARE OF THIS PATIENT ON DAY OF DISCHARGE: more than 34 minutes.   Saundra Shelling M.D on 07/17/2017 at 2:19 PM  Between 7am to 6pm - Pager - [phone]  After 6pm go to [email or web address] - password [email or web address] [facility]  Office  [phone]  CC: Primary care physician; Kirk Ruths, MD  Note: This dictation was prepared with Dragon dictation along with smaller phrase technology. Any transcriptional errors that result from this process are unintentional.

## 2017-07-17 NOTE — Consult Note (Signed)
[facility] Vascular Consult Note  MRN : [id number]  Michael Humphrey is a 67 y.o. (June 14, 1950) male who presents with chief complaint of  Chief Complaint  Patient presents with  . Cerebrovascular Accident  .  History of Present Illness: I am asked to see the patient by Dr. Estanislado Pandy for an incidental finding of a left jugular vein DVT that was seen while he was having a carotid duplex performed to evaluate a recent stroke.  The patient presented with a aphasia and still has significant difficulties of speaking.  His consciousness has improved and he is going to be discharged likely today or tomorrow.  He reports no previous central lines or catheters in his neck to his knowledge.  No chest pain or shortness of breath.  No fever or chills.  His carotid duplex was unrevealing and he is being managed medically for his stroke.  Neurology has seen the patient.  I have independently reviewed his venous duplex of the left jugular vein.  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  Current Facility-Administered Medications  Medication Dose Route Frequency Provider Last Rate Last Dose  . acetaminophen (TYLENOL) tablet 650 mg  650 mg Oral Q4H PRN Hillary Bow, MD       Or  . acetaminophen (TYLENOL) solution 650 mg  650 mg Per Tube Q4H PRN Sudini, Alveta Heimlich, MD       Or  . acetaminophen (TYLENOL) suppository 650 mg  650 mg Rectal Q4H PRN Sudini, Srikar, MD      . aspirin suppository 300 mg  300 mg Rectal Daily Sudini, Srikar, MD       Or  . aspirin tablet 325 mg  325 mg Oral Daily Hillary Bow, MD   325 mg at 07/17/17 0910  . carvedilol (COREG) tablet 3.125 mg  3.125 mg Oral BID Hillary Bow, MD   3.125 mg at 07/17/17 0911  . cyclobenzaprine (FLEXERIL) tablet 5-10 mg  5-10 mg Oral QHS PRN Sudini, Alveta Heimlich, MD      . enoxaparin (LOVENOX) injection 40 mg  40 mg Subcutaneous Q24H Hillary Bow, MD   40 mg at 07/16/17 1555  .  feeding supplement (GLUCERNA SHAKE) (GLUCERNA SHAKE) liquid 237 mL  237 mL Oral TID BM Pyreddy, Pavan, MD   237 mL at 07/16/17 1554  . gabapentin (NEURONTIN) capsule 300 mg  300 mg Oral TID Hillary Bow, MD   300 mg at 07/17/17 0911  . hydrALAZINE (APRESOLINE) injection 10 mg  10 mg Intravenous Q6H PRN Hillary Bow, MD   10 mg at 07/15/17 1626  . insulin aspart (novoLOG) injection 0-5 Units  0-5 Units Subcutaneous QHS Sudini, Srikar, MD      . insulin aspart (novoLOG) injection 0-9 Units  0-9 Units Subcutaneous TID WC Hillary Bow, MD   2 Units at 07/17/17 0912  . lisinopril (PRINIVIL,ZESTRIL) tablet 2.5 mg  2.5 mg Oral Daily Hillary Bow, MD   2.5 mg at 07/17/17 0910  . rosuvastatin (CRESTOR) tablet 20 mg  20 mg Oral Daily Pyreddy, Reatha Harps, MD   20 mg at 07/17/17 1237  . traZODone (DESYREL) tablet 100 mg  100 mg Oral QHS PRN Lance Coon, MD        Past Medical History:  Diagnosis Date  . Arthritis   . Diabetes mellitus without complication ([place])   . High cholesterol   . Hypertension     Past Surgical History:  Procedure Laterality  Date  . BUNIONECTOMY    . CHOLECYSTECTOMY    . COLONOSCOPY    . COLONOSCOPY WITH PROPOFOL N/A 12/17/2014   Procedure: COLONOSCOPY WITH PROPOFOL;  Surgeon: Lollie Sails, MD;  Location: [facility] ENDOSCOPY;  Service: Endoscopy;  Laterality: N/A;  . correct hammertoe    . JOINT REPLACEMENT    . KNEE ARTHROSCOPY Right   . TOTAL HIP ARTHROPLASTY    . TOTAL KNEE ARTHROPLASTY Right     Social History Social History   Tobacco Use  . Smoking status: Never Smoker  . Smokeless tobacco: Former Systems developer    Types: Chew  Substance Use Topics  . Alcohol use: No  . Drug use: No    Family History Family History  Problem Relation Age of Onset  . Clotting disorder Father   . Hypertension Father   No aneurysms.   No porphyria  Allergies  Allergen Reactions  . Clonidine Derivatives   . Fioricet [Butalbital-Apap-Caffeine]   . Hydrocodone-Acetaminophen    . Mobic [Meloxicam]   . Norvasc [Amlodipine Besylate]   . Statins     Pt does not remember being allergic to any statins  . Tramadol Itching     REVIEW OF SYSTEMS (Negative unless checked)  Constitutional: [] Weight loss  [] Fever  [] Chills Cardiac: [] Chest pain   [] Chest pressure   [] Palpitations   [] Shortness of breath when laying flat   [] Shortness of breath at rest   [] Shortness of breath with exertion. Vascular:  [] Pain in legs with walking   [] Pain in legs at rest   [] Pain in legs when laying flat   [] Claudication   [] Pain in feet when walking  [] Pain in feet at rest  [] Pain in feet when laying flat   [] History of DVT   [] Phlebitis   [] Swelling in legs   [] Varicose veins   [] Non-healing ulcers Pulmonary:   [] Uses home oxygen   [] Productive cough   [] Hemoptysis   [] Wheeze  [] COPD   [] Asthma Neurologic:  [] Dizziness  [] Blackouts   [] Seizures   [x] History of stroke   [] History of TIA  [x] Aphasia   [] Temporary blindness   [] Dysphagia   [] Weakness or numbness in arms   [] Weakness or numbness in legs Musculoskeletal:  [x] Arthritis   [] Joint swelling   [] Joint pain   [] Low back pain Hematologic:  [] Easy bruising  [] Easy bleeding   [] Hypercoagulable state   [] Anemic  [] Hepatitis Gastrointestinal:  [] Blood in stool   [] Vomiting blood  [] Gastroesophageal reflux/heartburn   [] Difficulty swallowing. Genitourinary:  [] Chronic kidney disease   [] Difficult urination  [] Frequent urination  [] Burning with urination   [] Blood in urine Skin:  [] Rashes   [] Ulcers   [] Wounds Psychological:  [] History of anxiety   []  History of major depression.  Physical Examination  Vitals:   07/16/17 2049 07/17/17 0036 07/17/17 0534 07/17/17 0851  BP: (!) 160/101 (!) 159/98 (!) 154/96 (!) 168/104  Pulse: 81 67 (!) 59 66  Resp: 18 17 18 18   Temp: 97.8 F (36.6 C) (!) 97.5 F (36.4 C) (!) 97.4 F (36.3 C)   TempSrc: Oral Oral Oral   SpO2: 97% 99% 97% 99%  Weight:      Height:       Body mass index is 32.64  kg/m. Gen:  WD/WN, NAD Head: [place]/AT, No temporalis wasting.  Ear/Nose/Throat: Hearing grossly intact, nares w/o erythema or drainage, oropharynx w/o Erythema/Exudate Eyes: Sclera non-icteric, conjunctiva clear Neck: Trachea midline.  No JVD.  Pulmonary:  Good air movement, respirations not labored, equal  bilaterally.  Cardiac: RRR, no JVD Vascular:  Vessel Right Left  Radial Palpable Palpable                                    Musculoskeletal: M/S 5/5 throughout.  Extremities without ischemic changes.  No deformity or atrophy. No edema. Neurologic: Sensation grossly intact in extremities.  Symmetrical.  Speech is labored and choppy.  Has difficulty finding words. Motor exam as listed above. Psychiatric: Judgment intact, Mood & affect appropriate for pt's clinical situation. Dermatologic: No rashes or ulcers noted.  No cellulitis or open wounds.       CBC Lab Results  Component Value Date   WBC 7.3 07/15/2017   HGB 16.3 07/15/2017   HCT 46.8 07/15/2017   MCV 94.9 07/15/2017   PLT 174 07/15/2017    BMET    Component Value Date/Time   NA 140 07/15/2017 1236   NA 132 (L) 01/05/2014 0730   K 3.9 07/15/2017 1236   K 3.9 01/05/2014 0730   CL 102 07/15/2017 1236   CL 97 (L) 01/05/2014 0730   CO2 28 07/15/2017 1236   CO2 29 01/05/2014 0730   GLUCOSE 159 (H) 07/15/2017 1236   GLUCOSE 155 (H) 01/05/2014 0730   BUN 28 (H) 07/15/2017 1236   BUN 43 (H) 01/05/2014 0730   CREATININE 1.16 07/15/2017 1236   CREATININE 1.61 (H) 01/05/2014 0730   CALCIUM 9.7 07/15/2017 1236   CALCIUM 9.2 01/05/2014 0730   GFRNONAA >60 07/15/2017 1236   GFRNONAA 46 (L) 01/05/2014 0730   GFRNONAA 55 (L) 03/20/2013 0539   GFRAA >60 07/15/2017 1236   GFRAA 56 (L) 01/05/2014 0730   GFRAA >60 03/20/2013 0539   Estimated Creatinine Clearance: 74.1 mL/min (by C-G formula based on SCr of 1.16 mg/dL).  COAG Lab Results  Component Value Date   INR 0.97 07/15/2017   INR 1.0 03/03/2013   INR  1.0 10/25/2011    Radiology Ct Head Wo Contrast  Result Date: 07/15/2017 CLINICAL DATA:  Patient reports on Saturday he started drooling. Wife reports noticing slurred speech yesterday 1300. Patient noted to have right sided facial droop. No anticoagulation. EXAM: CT HEAD WITHOUT CONTRAST TECHNIQUE: Contiguous axial images were obtained from the base of the skull through the vertex without intravenous contrast. COMPARISON:  None. FINDINGS: Brain: No evidence of acute infarction, hemorrhage, hydrocephalus, extra-axial collection or mass lesion/mass effect. There is ventricular and sulcal enlargement consistent with mild atrophy. Are small areas of white matter hypoattenuation are likely due to mild chronic microvascular ischemic change. Vascular: No hyperdense vessel or unexpected calcification. Skull: Normal. Negative for fracture or focal lesion. Sinuses/Orbits: Normal globes and orbits. Visualized sinuses and mastoid air cells are clear. Other: None. IMPRESSION: 1. No acute intracranial abnormalities. 2. Mild atrophy and chronic microvascular ischemic change. Electronically Signed   By: Lajean Manes M.D.   On: 07/15/2017 13:01   Mr Brain Wo Contrast  Result Date: 07/15/2017 CLINICAL DATA:  67 y/o M; history of hypertension, diabetes, hyperlipidemia presenting to the emergency room with acute onset of right facial droop, drooling, and slurred speech since Friday. EXAM: MRI HEAD WITHOUT CONTRAST MRA HEAD WITHOUT CONTRAST TECHNIQUE: Multiplanar, multiecho pulse sequences of the brain and surrounding structures were obtained without intravenous contrast. Angiographic images of the head were obtained using MRA technique without contrast. COMPARISON:  07/15/2017 CT head FINDINGS: MRI HEAD FINDINGS Brain: 11 mm focus of reduced diffusion within  left lateral frontal subcortical white matter and corona radiata compatible with acute/early subacute infarction. No associated hemorrhage or mass effect. Small chronic  infarctions within the right mid corona radiata and right caudate head. Mild chronic microvascular ischemic changes of white matter and parenchymal volume loss of the brain. Punctate focus of susceptibility hypointensity within right lateral thalamus compatible hemosiderin deposition of chronic microhemorrhage. No extra-axial collection, hydrocephalus, focal mass effect of the brain parenchyma, or herniation. Vascular: Normal flow voids. Skull and upper cervical spine: Normal marrow signal. Sinuses/Orbits: Mild mucosal thickening within the maxillary sinuses. No abnormal signal the mastoid air cells. Orbits are unremarkable. Other: None. MRA HEAD FINDINGS Mild motion artifact. Internal carotid arteries:  Patent. Anterior cerebral arteries:  Patent. Middle cerebral arteries: Patent. Anterior communicating artery: Probable small small caliber patent vessel. Posterior communicating arteries: Patent right. No left identified, likely hypoplastic or absent. Posterior cerebral arteries:  Patent. Basilar artery:  Patent. Vertebral arteries:  Patent. No evidence of high-grade stenosis, large vessel occlusion, or aneurysm unless noted above. IMPRESSION: 1. 11 mm acute/early subacute infarction within left lateral frontal subcortical white matter and corona radiata. No associated hemorrhage or mass effect. 2. Patent intracranial circulation. No large vessel occlusion, aneurysm, or significant stenosis is identified. 3. 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 4. Mild chronic microvascular ischemic changes and parenchymal volume loss of the brain. These results will be called to the ordering clinician or representative by the Radiologist Assistant, and communication documented in the PACS or zVision Dashboard. Electronically Signed   By: Kristine Garbe M.D.   On: 07/15/2017 22:54   US Carotid Bilateral (at [facility] And [facility] Only)  Result Date: 07/16/2017 CLINICAL DATA:  CVA.  History of hypertension and diabetes. Former smoker. EXAM: BILATERAL CAROTID DUPLEX ULTRASOUND TECHNIQUE: Pearline Cables scale imaging, color Doppler and duplex ultrasound were performed of bilateral carotid and vertebral arteries in the neck. COMPARISON:  None. FINDINGS: Criteria: Quantification of carotid stenosis is based on velocity parameters that correlate the residual internal carotid diameter with NASCET-based stenosis levels, using the diameter of the distal internal carotid lumen as the denominator for stenosis measurement. The following velocity measurements were obtained: RIGHT ICA:  46/14 cm/sec CCA:  546/50 cm/sec SYSTOLIC ICA/CCA RATIO:  0.4 ECA:  75 cm/sec LEFT ICA:  69/27 cm/sec CCA:  35/46 cm/sec SYSTOLIC ICA/CCA RATIO:  1.1 ECA:  107 cm/sec RIGHT CAROTID ARTERY: There is a minimal amount of atherosclerotic plaque within the mid and distal aspects of the right common carotid artery (image 11). There is a minimal amount of atherosclerotic plaque within the left carotid bulb (image 16), extending to involve the origin and proximal aspects of the right internal carotid artery (image 24), not resulting in elevated peak systolic velocities within the right internal carotid artery to suggest a hemodynamically significant stenosis. RIGHT VERTEBRAL ARTERY:  Antegrade flow LEFT CAROTID ARTERY: There is a minimal amount of eccentric echogenic plaque within the mid (image 42) and distal (image 46) aspects of the left common carotid artery. There is a minimal amount of hypoechoic plaque involving the origin and proximal aspects of the left internal carotid artery (image 59), not resulting in elevated peak systolic velocities within the interrogated course the left internal carotid artery to suggest a hemodynamically significant stenosis. LEFT VERTEBRAL ARTERY:  Antegrade flow There is a minimal amount of nonocclusive wall thickening/chronic DVT within the left internal jugular vein (image 39, as well as additional  provided cinegraphic images). IMPRESSION: 1. Minimal amount  of bilateral atherosclerotic plaque, right subjectively greater than left, not resulting in a hemodynamically significant stenosis within either internal carotid artery. 2. 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 Further evaluation with dedicated left upper extremity venous Doppler ultrasound could be performed as indicated. These results will be called to the ordering clinician or representative by the Radiologist Assistant, and communication documented in the PACS or zVision Dashboard. Electronically Signed   By: Sandi Mariscal M.D.   On: 07/16/2017 11:41   US Venous Img Upper Uni Left  Result Date: 07/16/2017 CLINICAL DATA:  Nonocclusive thrombus incidentally noted on carotid Doppler ultrasound performed earlier same day. EXAM: LEFT UPPER EXTREMITY VENOUS DOPPLER ULTRASOUND TECHNIQUE: Gray-scale sonography with graded compression, as well as color Doppler and duplex ultrasound were performed to evaluate the upper extremity deep venous system from the level of the subclavian vein and including the jugular, axillary, basilic, radial, ulnar and upper cephalic vein. Spectral Doppler was utilized to evaluate flow at rest and with distal augmentation maneuvers. COMPARISON:  None. FINDINGS: Contralateral Subclavian Vein: Respiratory phasicity is normal and symmetric with the symptomatic side. No evidence of thrombus. Normal compressibility. Internal Jugular Vein: No evidence of thrombus. Normal compressibility, respiratory phasicity and response to augmentation. Subclavian Vein: There is a minimal amount of hypoechoic nonocclusive wall thickening/DVT within the left internal jugular vein (images 4 and 8). Axillary Vein: No evidence of thrombus. Normal compressibility, respiratory phasicity and response to augmentation. Cephalic Vein: No evidence of thrombus. Normal  compressibility, respiratory phasicity and response to augmentation. Basilic Vein: No evidence of thrombus. Normal compressibility, respiratory phasicity and response to augmentation. Brachial Veins: No evidence of thrombus. Normal compressibility, respiratory phasicity and response to augmentation. Radial Veins: No evidence of thrombus. Normal compressibility, respiratory phasicity and response to augmentation. Ulnar Veins: No evidence of thrombus. Normal compressibility, respiratory phasicity and response to augmentation. Venous Reflux:  None visualized. Other Findings:  None visualized. IMPRESSION: 1. 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 2. The remainder of the left upper extremity venous system appears widely patent without evidence of acute or chronic DVT or SVT. Electronically Signed   By: Sandi Mariscal M.D.   On: 07/16/2017 14:46   Mr Jodene Nam Head/brain TK Cm  Result Date: 07/15/2017 CLINICAL DATA:  67 y/o M; history of hypertension, diabetes, hyperlipidemia presenting to the emergency room with acute onset of right facial droop, drooling, and slurred speech since Friday. EXAM: MRI HEAD WITHOUT CONTRAST MRA HEAD WITHOUT CONTRAST TECHNIQUE: Multiplanar, multiecho pulse sequences of the brain and surrounding structures were obtained without intravenous contrast. Angiographic images of the head were obtained using MRA technique without contrast. COMPARISON:  07/15/2017 CT head FINDINGS: MRI HEAD FINDINGS Brain: 11 mm focus of reduced diffusion within left lateral frontal subcortical white matter and corona radiata compatible with acute/early subacute infarction. No associated hemorrhage or mass effect. 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 Mild chronic microvascular ischemic changes of white matter and parenchymal  volume loss of the brain. Punctate focus of susceptibility hypointensity within right lateral thalamus compatible hemosiderin deposition of chronic microhemorrhage. No extra-axial collection, hydrocephalus, focal mass effect of the brain parenchyma, or herniation. Vascular: Normal flow voids. Skull and upper cervical spine: Normal marrow signal. Sinuses/Orbits: Mild mucosal thickening within the maxillary sinuses. No abnormal signal the mastoid air cells. Orbits  are unremarkable. Other: None. MRA HEAD FINDINGS Mild motion artifact. Internal carotid arteries:  Patent. Anterior cerebral arteries:  Patent. Middle cerebral arteries: Patent. Anterior communicating artery: Probable small small caliber patent vessel. Posterior communicating arteries: Patent right. No left identified, likely hypoplastic or absent. Posterior cerebral arteries:  Patent. Basilar artery:  Patent. Vertebral arteries:  Patent. No evidence of high-grade stenosis, large vessel occlusion, or aneurysm unless noted above. IMPRESSION: 1. 11 mm acute/early subacute infarction within left lateral frontal subcortical white matter and corona radiata. No associated hemorrhage or mass effect. 2. Patent intracranial circulation. No large vessel occlusion, aneurysm, or significant stenosis is identified. 3. 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 4. Mild chronic microvascular ischemic changes and parenchymal volume loss of the brain. These results will be called to the ordering clinician or representative by the Radiologist Assistant, and communication documented in the PACS or zVision Dashboard. Electronically Signed   By: Kristine Garbe M.D.   On: 07/15/2017 22:54      Assessment/Plan 1.  Left jugular vein DVT.  The description by the ultrasound and review of the study this is more than likely a chronic DVT in the jugular vein.  This would be a relatively low risk of DVT even if acute, and I think  antiplatelet therapy with either 325 mg of aspirin or aspirin 81 mg and Plavix would be reasonable.  This should be continued for at least 6 months.  He can follow-up with me as needed but does not require a follow-up visit. 2.  Stroke.  Aphasia is still an issue.  His carotid duplex was unrevealing so no intervention from a vascular standpoint would likely be of benefit at this point 3.  Diabetes.  Stable on outpatient medications and blood glucose control important in reducing the progression of atherosclerotic disease. Also, involved in wound healing. On appropriate medications. 4.  HTN. Stable on outpatient medications although a little permissive hypertension following his stroke is likely helpful.   Leotis Pain, MD  07/17/2017 1:34 PM    This note was created with Dragon medical transcription system.  Any error is purely unintentional

## 2017-07-17 NOTE — Care Management (Signed)
Discharge to home today per Dr. Estanislado Pandy. Home Health services ordered,  Spoke with Mr. Michael Humphrey and wife at the bedside. Declining these services at this time. Sees Dr, Ouida Sills as primary care physician. Shelbie Ammons RN MSN CCM Care Management [phone]

## 2017-07-17 NOTE — Progress Notes (Signed)
PT Cancellation Note  Patient Details Name: Michael Humphrey MRN: [id number] DOB: July 09, 1950   Cancelled Treatment:    Reason Eval/Treat Not Completed: Other (comment).  PT order discontinued/cancelled by MD Pyreddy.  Leitha Bleak, PT 07/17/17, 1:43 PM [phone]

## 2017-07-17 NOTE — Progress Notes (Signed)
PT Cancellation Note  Patient Details Name: Michael Humphrey MRN: [id number] DOB: February 25, 1950   Cancelled Treatment:    Reason Eval/Treat Not Completed: Other (comment).  PT consult received.  Chart reviewed.  Pt currently pending vascular surgery consult for non-occlusive DVT internal jugular vein.  Will hold PT at this time and re-attempt PT evaluation at a later date/time once vascular surgery consult is completed and any changes to POC are known.  Leitha Bleak, PT 07/17/17, 11:54 AM [phone]

## 2017-07-17 NOTE — Progress Notes (Signed)
Pt has been discharged home. Discharge papers given and explained to pt and spouse, both verbalized understanding. Meds and f/u appointments reviewed. RX given.

## 2017-07-22 ENCOUNTER — Other Ambulatory Visit: Payer: Self-pay | Admitting: Pharmacist

## 2017-07-22 NOTE — Patient Outreach (Signed)
[facility]) Care Management  07/22/2017  Michael Humphrey October 31, 1950 [id number]   30 day post discharge medication review outreach was successful, but patient declined to review medications with me today.   [name], PharmD PGY2 Ambulatory Care Pharmacy Resident Phone: [phone]

## 2017-07-25 DIAGNOSIS — I779 Disorder of arteries and arterioles, unspecified: Secondary | ICD-10-CM | POA: Diagnosis not present

## 2017-07-25 DIAGNOSIS — I679 Cerebrovascular disease, unspecified: Secondary | ICD-10-CM | POA: Diagnosis not present

## 2017-08-08 DIAGNOSIS — E1122 Type 2 diabetes mellitus with diabetic chronic kidney disease: Secondary | ICD-10-CM | POA: Diagnosis not present

## 2017-08-08 DIAGNOSIS — I129 Hypertensive chronic kidney disease with stage 1 through stage 4 chronic kidney disease, or unspecified chronic kidney disease: Secondary | ICD-10-CM | POA: Diagnosis not present

## 2017-08-08 DIAGNOSIS — I10 Essential (primary) hypertension: Secondary | ICD-10-CM | POA: Diagnosis not present

## 2017-08-08 DIAGNOSIS — N183 Chronic kidney disease, stage 3 (moderate): Secondary | ICD-10-CM | POA: Diagnosis not present

## 2017-08-08 DIAGNOSIS — E7800 Pure hypercholesterolemia, unspecified: Secondary | ICD-10-CM | POA: Diagnosis not present

## 2017-08-15 DIAGNOSIS — I10 Essential (primary) hypertension: Secondary | ICD-10-CM | POA: Diagnosis not present

## 2017-08-15 DIAGNOSIS — I129 Hypertensive chronic kidney disease with stage 1 through stage 4 chronic kidney disease, or unspecified chronic kidney disease: Secondary | ICD-10-CM | POA: Diagnosis not present

## 2017-08-15 DIAGNOSIS — M48062 Spinal stenosis, lumbar region with neurogenic claudication: Secondary | ICD-10-CM | POA: Diagnosis not present

## 2017-08-15 DIAGNOSIS — N183 Chronic kidney disease, stage 3 (moderate): Secondary | ICD-10-CM | POA: Diagnosis not present

## 2017-08-15 DIAGNOSIS — E1122 Type 2 diabetes mellitus with diabetic chronic kidney disease: Secondary | ICD-10-CM | POA: Diagnosis not present

## 2017-08-15 DIAGNOSIS — G4733 Obstructive sleep apnea (adult) (pediatric): Secondary | ICD-10-CM | POA: Diagnosis not present

## 2017-08-15 DIAGNOSIS — I779 Disorder of arteries and arterioles, unspecified: Secondary | ICD-10-CM | POA: Diagnosis not present

## 2017-08-15 DIAGNOSIS — Z6835 Body mass index (BMI) 35.0-35.9, adult: Secondary | ICD-10-CM | POA: Diagnosis not present

## 2017-08-15 DIAGNOSIS — Z789 Other specified health status: Secondary | ICD-10-CM | POA: Diagnosis not present

## 2017-08-23 DIAGNOSIS — M5136 Other intervertebral disc degeneration, lumbar region: Secondary | ICD-10-CM | POA: Diagnosis not present

## 2017-08-23 DIAGNOSIS — M5416 Radiculopathy, lumbar region: Secondary | ICD-10-CM | POA: Diagnosis not present

## 2017-08-23 DIAGNOSIS — M48062 Spinal stenosis, lumbar region with neurogenic claudication: Secondary | ICD-10-CM | POA: Diagnosis not present

## 2017-10-23 DIAGNOSIS — N183 Chronic kidney disease, stage 3 (moderate): Secondary | ICD-10-CM | POA: Diagnosis not present

## 2017-10-23 DIAGNOSIS — I129 Hypertensive chronic kidney disease with stage 1 through stage 4 chronic kidney disease, or unspecified chronic kidney disease: Secondary | ICD-10-CM | POA: Diagnosis not present

## 2017-10-23 DIAGNOSIS — M5416 Radiculopathy, lumbar region: Secondary | ICD-10-CM | POA: Diagnosis not present

## 2017-10-23 DIAGNOSIS — I10 Essential (primary) hypertension: Secondary | ICD-10-CM | POA: Diagnosis not present

## 2017-10-23 DIAGNOSIS — E1122 Type 2 diabetes mellitus with diabetic chronic kidney disease: Secondary | ICD-10-CM | POA: Diagnosis not present

## 2017-10-23 DIAGNOSIS — M48062 Spinal stenosis, lumbar region with neurogenic claudication: Secondary | ICD-10-CM | POA: Diagnosis not present

## 2017-10-23 DIAGNOSIS — M5136 Other intervertebral disc degeneration, lumbar region: Secondary | ICD-10-CM | POA: Diagnosis not present

## 2017-10-29 DIAGNOSIS — E113311 Type 2 diabetes mellitus with moderate nonproliferative diabetic retinopathy with macular edema, right eye: Secondary | ICD-10-CM | POA: Diagnosis not present

## 2017-12-03 DIAGNOSIS — H2512 Age-related nuclear cataract, left eye: Secondary | ICD-10-CM | POA: Diagnosis not present

## 2017-12-04 ENCOUNTER — Encounter: Payer: Self-pay | Admitting: Anesthesiology

## 2017-12-06 NOTE — Discharge Instructions (Signed)

## 2017-12-09 NOTE — Anesthesia Preprocedure Evaluation (Addendum)
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 Was seen by neurology attending during the hospitalization. Patient received aspirin and Crestor during the hospitalization. Patient was worked up with CT head, MRI and MRA brain which showed acute CVA. He had carotid ultrasound which showed no significant stenosis but nonocclusive DVT in the internal jugular vein in the left side. Patient also had work-up with left upper extremity Doppler venous ultrasound which showed no DVT. consult was done with vascular surgery also for nonocclusive DVT in the internal jugular vein. They recommended only aspirin and no anticoagulation. No surgical intervention recommended. Patient will be discharged home with home health services, physical therapy, occupational therapy and speech therapy.   Reproductive/Obstetrics                            Anesthesia Physical Anesthesia Plan  ASA:  III  Anesthesia Plan: MAC   Post-op Pain Management:    Induction: Intravenous  PONV Risk Score and Plan: 1 and Midazolam and TIVA  Airway Management Planned: Natural Airway  Additional Equipment:   Intra-op Plan:   Post-operative Plan:   Informed Consent: I have reviewed the patients History and Physical, chart, labs and discussed the procedure including the risks, benefits and alternatives for the proposed anesthesia with the patient or authorized representative who has indicated his/her understanding and acceptance.     Plan Discussed with: CRNA  Anesthesia Plan Comments:        Anesthesia Quick Evaluation

## 2017-12-11 ENCOUNTER — Encounter
Admission: RE | Disposition: A | Discharge status: Home / Self Care | Payer: Self-pay | Source: Ambulatory Visit | Attending: Ophthalmology

## 2017-12-11 ENCOUNTER — Ambulatory Visit
Admission: RE | Admit: 2017-12-11 | Discharge: 2017-12-11 | Disposition: A | Discharge status: Home / Self Care | Payer: PPO | Source: Ambulatory Visit | Attending: Ophthalmology | Admitting: Ophthalmology

## 2017-12-11 ENCOUNTER — Ambulatory Visit: Payer: PPO | Admitting: Anesthesiology

## 2017-12-11 DIAGNOSIS — Z7982 Long term (current) use of aspirin: Secondary | ICD-10-CM | POA: Insufficient documentation

## 2017-12-11 DIAGNOSIS — Z96653 Presence of artificial knee joint, bilateral: Secondary | ICD-10-CM | POA: Diagnosis not present

## 2017-12-11 DIAGNOSIS — Z7984 Long term (current) use of oral hypoglycemic drugs: Secondary | ICD-10-CM | POA: Insufficient documentation

## 2017-12-11 DIAGNOSIS — E7800 Pure hypercholesterolemia, unspecified: Secondary | ICD-10-CM | POA: Insufficient documentation

## 2017-12-11 DIAGNOSIS — Z79899 Other long term (current) drug therapy: Secondary | ICD-10-CM | POA: Diagnosis not present

## 2017-12-11 DIAGNOSIS — E1122 Type 2 diabetes mellitus with diabetic chronic kidney disease: Secondary | ICD-10-CM | POA: Insufficient documentation

## 2017-12-11 DIAGNOSIS — H2512 Age-related nuclear cataract, left eye: Secondary | ICD-10-CM | POA: Diagnosis not present

## 2017-12-11 DIAGNOSIS — Z96643 Presence of artificial hip joint, bilateral: Secondary | ICD-10-CM | POA: Insufficient documentation

## 2017-12-11 DIAGNOSIS — Z86718 Personal history of other venous thrombosis and embolism: Secondary | ICD-10-CM | POA: Diagnosis not present

## 2017-12-11 DIAGNOSIS — N183 Chronic kidney disease, stage 3 (moderate): Secondary | ICD-10-CM | POA: Insufficient documentation

## 2017-12-11 DIAGNOSIS — E1136 Type 2 diabetes mellitus with diabetic cataract: Secondary | ICD-10-CM | POA: Insufficient documentation

## 2017-12-11 DIAGNOSIS — H25812 Combined forms of age-related cataract, left eye: Secondary | ICD-10-CM | POA: Diagnosis not present

## 2017-12-11 DIAGNOSIS — I129 Hypertensive chronic kidney disease with stage 1 through stage 4 chronic kidney disease, or unspecified chronic kidney disease: Secondary | ICD-10-CM | POA: Insufficient documentation

## 2017-12-11 DIAGNOSIS — Z8673 Personal history of transient ischemic attack (TIA), and cerebral infarction without residual deficits: Secondary | ICD-10-CM | POA: Insufficient documentation

## 2017-12-11 HISTORY — DX: Acute embolism and thrombosis of unspecified deep veins of unspecified lower extremity: I82.409

## 2017-12-11 HISTORY — DX: Radiculopathy, lumbar region: M54.16

## 2017-12-11 HISTORY — DX: Cerebral infarction, unspecified: I63.9

## 2017-12-11 HISTORY — PX: CATARACT EXTRACTION W/PHACO: SHX586

## 2017-12-11 LAB — GLUCOSE, CAPILLARY
GLUCOSE-CAPILLARY: 107 mg/dL — AB (ref 70–99)
Glucose-Capillary: 146 mg/dL — ABNORMAL HIGH (ref 70–99)

## 2017-12-11 SURGERY — PHACOEMULSIFICATION, CATARACT, WITH IOL INSERTION
Anesthesia: Monitor Anesthesia Care | Site: Eye | Laterality: Left

## 2017-12-11 MED ORDER — TETRACAINE HCL 0.5 % OP SOLN
1.0000 [drp] | OPHTHALMIC | Status: DC | PRN
  Administered 2017-12-11 (×2): 1 [drp] via OPHTHALMIC

## 2017-12-11 MED ORDER — LACTATED RINGERS IV SOLN: INTRAVENOUS | Status: DC

## 2017-12-11 MED ORDER — CEFUROXIME OPHTHALMIC INJECTION 1 MG/0.1 ML
INJECTION | OPHTHALMIC | Status: DC | PRN
  Administered 2017-12-11: 0.1 mL via INTRACAMERAL

## 2017-12-11 MED ORDER — MOXIFLOXACIN HCL 0.5 % OP SOLN
1.0000 [drp] | OPHTHALMIC | Status: DC | PRN
  Administered 2017-12-11 (×3): 1 [drp] via OPHTHALMIC

## 2017-12-11 MED ORDER — ACETAMINOPHEN 325 MG PO TABS: 650.0000 mg | ORAL_TABLET | Freq: Once | ORAL | Status: DC | PRN

## 2017-12-11 MED ORDER — FENTANYL CITRATE (PF) 100 MCG/2ML IJ SOLN
INTRAMUSCULAR | Status: DC | PRN
  Administered 2017-12-11 (×2): 50 ug via INTRAVENOUS

## 2017-12-11 MED ORDER — BRIMONIDINE TARTRATE-TIMOLOL 0.2-0.5 % OP SOLN
OPHTHALMIC | Status: DC | PRN
  Administered 2017-12-11: 1 [drp] via OPHTHALMIC

## 2017-12-11 MED ORDER — ACETAMINOPHEN 160 MG/5ML PO SOLN: 325.0000 mg | ORAL | Status: DC | PRN

## 2017-12-11 MED ORDER — ARMC OPHTHALMIC DILATING DROPS
1.0000 "application " | OPHTHALMIC | Status: DC | PRN
  Administered 2017-12-11 (×3): 1 via OPHTHALMIC

## 2017-12-11 MED ORDER — MIDAZOLAM HCL 2 MG/2ML IJ SOLN
INTRAMUSCULAR | Status: DC | PRN
  Administered 2017-12-11 (×2): 1 mg via INTRAVENOUS

## 2017-12-11 MED ORDER — LIDOCAINE HCL (PF) 2 % IJ SOLN
INTRAOCULAR | Status: DC | PRN
  Administered 2017-12-11: .5 mL

## 2017-12-11 MED ORDER — EPINEPHRINE PF 1 MG/ML IJ SOLN
INTRAOCULAR | Status: DC | PRN
  Administered 2017-12-11: 66 mL via OPHTHALMIC

## 2017-12-11 MED ORDER — ONDANSETRON HCL 4 MG/2ML IJ SOLN: 4.0000 mg | Freq: Once | INTRAMUSCULAR | Status: DC | PRN

## 2017-12-11 MED ORDER — NA HYALUR & NA CHOND-NA HYALUR 0.4-0.35 ML IO KIT
PACK | INTRAOCULAR | Status: DC | PRN
  Administered 2017-12-11: 1 mL via INTRAOCULAR

## 2017-12-11 SURGICAL SUPPLY — 24 items
CANNULA ANT/CHMB 27GA (MISCELLANEOUS) ×3 IMPLANT
GLOVE SURG LX 7.5 STRW (GLOVE) ×2
GLOVE SURG LX STRL 7.5 STRW (GLOVE) ×1 IMPLANT
GLOVE SURG TRIUMPH 8.0 PF LTX (GLOVE) ×3 IMPLANT
GOWN STRL REUS W/ TWL LRG LVL3 (GOWN DISPOSABLE) ×2 IMPLANT
GOWN STRL REUS W/TWL LRG LVL3 (GOWN DISPOSABLE) ×4
LENS IOL TECNIS ITEC 21.5 (Intraocular Lens) ×3 IMPLANT
MARKER SKIN DUAL TIP RULER LAB (MISCELLANEOUS) ×3 IMPLANT
NDL RETROBULBAR .5 NSTRL (NEEDLE) IMPLANT
NEEDLE FILTER BLUNT 18X 1/2SAF (NEEDLE) ×2
NEEDLE FILTER BLUNT 18X1 1/2 (NEEDLE) ×1 IMPLANT
PACK CATARACT BRASINGTON (MISCELLANEOUS) ×3 IMPLANT
PACK EYE AFTER SURG (MISCELLANEOUS) ×3 IMPLANT
PACK OPTHALMIC (MISCELLANEOUS) ×3 IMPLANT
RING MALYGIN 7.0 (MISCELLANEOUS) IMPLANT
SUT ETHILON 10-0 CS-B-6CS-B-6 (SUTURE)
SUT VICRYL  9 0 (SUTURE)
SUT VICRYL 9 0 (SUTURE) IMPLANT
SUTURE EHLN 10-0 CS-B-6CS-B-6 (SUTURE) IMPLANT
SYR 3ML LL SCALE MARK (SYRINGE) ×3 IMPLANT
SYR 5ML LL (SYRINGE) ×3 IMPLANT
SYR TB 1ML LUER SLIP (SYRINGE) ×3 IMPLANT
WATER STERILE IRR 500ML POUR (IV SOLUTION) ×3 IMPLANT
WIPE NON LINTING 3.25X3.25 (MISCELLANEOUS) ×3 IMPLANT

## 2017-12-11 NOTE — H&P (Signed)

## 2017-12-11 NOTE — Anesthesia Procedure Notes (Signed)
Procedure Name: [place] Performed by: Cameron Ali, CRNA Pre-anesthesia Checklist: Patient identified, Emergency Drugs available, Suction available, Timeout performed and Patient being monitored Patient Re-evaluated:Patient Re-evaluated prior to induction Oxygen Delivery Method: Nasal cannula Placement Confirmation: positive ETCO2

## 2017-12-11 NOTE — Anesthesia Postprocedure Evaluation (Signed)
Anesthesia Post Note  Patient: Michael Humphrey  Procedure(s) Performed: CATARACT EXTRACTION PHACO AND INTRAOCULAR LENS PLACEMENT (IOC)  LEFT DIABETIC (Left Eye)  Patient location during evaluation: PACU Anesthesia Type: MAC Level of consciousness: awake and alert, oriented and patient cooperative Pain management: pain level controlled Vital Signs Assessment: post-procedure vital signs reviewed and stable Respiratory status: spontaneous breathing, nonlabored ventilation and respiratory function stable Cardiovascular status: blood pressure returned to baseline and stable Postop Assessment: adequate PO intake Anesthetic complications: no    Darrin Nipper

## 2017-12-11 NOTE — Transfer of Care (Signed)
Immediate Anesthesia Transfer of Care Note  Patient: Michael Humphrey  Procedure(s) Performed: CATARACT EXTRACTION PHACO AND INTRAOCULAR LENS PLACEMENT (IOC)  LEFT DIABETIC (Left Eye)  Patient Location: PACU  Anesthesia Type: MAC  Level of Consciousness: awake, alert  and patient cooperative  Airway and Oxygen Therapy: Patient Spontanous Breathing and Patient connected to supplemental oxygen  Post-op Assessment: Post-op Vital signs reviewed, Patient's Cardiovascular Status Stable, Respiratory Function Stable, Patent Airway and No signs of Nausea or vomiting  Post-op Vital Signs: Reviewed and stable  Complications: No apparent anesthesia complications

## 2017-12-11 NOTE — Op Note (Signed)
OPERATIVE NOTE  Wilbon Obenchain [id number] 12/11/2017   PREOPERATIVE DIAGNOSIS:  Nuclear sclerotic cataract left eye. H25.12   POSTOPERATIVE DIAGNOSIS:    Nuclear sclerotic cataract left eye.     PROCEDURE:  Phacoemusification with posterior chamber intraocular lens placement of the left eye   LENS:   Implant Name Type Inv. Item Serial No. Manufacturer Lot No. LRB No. Used  LENS IOL DIOP 21.5 - [id number] Intraocular Lens LENS IOL DIOP 21.5 [id number]  Left 1     ULTRASOUND TIME: 15  % of 1 minutes 6 seconds, CDE 10.0  SURGEON:  Wyonia Hough, MD   ANESTHESIA:  Topical with tetracaine drops and 2% Xylocaine jelly, augmented with 1% preservative-free intracameral lidocaine.    COMPLICATIONS:  None.   DESCRIPTION OF PROCEDURE:  The patient was identified in the holding room and transported to the operating room and placed in the supine position under the operating microscope.  The left eye was identified as the operative eye and it was prepped and draped in the usual sterile ophthalmic fashion.   A 1 millimeter clear-corneal paracentesis was made at the 1:30 position.  0.5 ml of preservative-free 1% lidocaine was injected into the anterior chamber.  The anterior chamber was filled with Viscoat viscoelastic.  A 2.4 millimeter keratome was used to make a near-clear corneal incision at the 10:30 position.  .  A curvilinear capsulorrhexis was made with a cystotome and capsulorrhexis forceps.  Balanced salt solution was used to hydrodissect and hydrodelineate the nucleus.   Phacoemulsification was then used in stop and chop fashion to remove the lens nucleus and epinucleus.  The remaining cortex was then removed using the irrigation and aspiration handpiece. Provisc was then placed into the capsular bag to distend it for lens placement.  A lens was then injected into the capsular bag.  The remaining viscoelastic was aspirated.   Wounds were hydrated with balanced salt  solution.  The anterior chamber was inflated to a physiologic pressure with balanced salt solution.  No wound leaks were noted. Cefuroxime 0.1 ml of a 10mg /ml solution was injected into the anterior chamber for a dose of 1 mg of intracameral antibiotic at the completion of the case.   Timolol and Brimonidine drops were applied to the eye.  The patient was taken to the recovery room in stable condition without complications of anesthesia or surgery.  Makailah Slavick 12/11/2017, 8:08 AM

## 2017-12-12 ENCOUNTER — Encounter: Payer: Self-pay | Admitting: Ophthalmology

## 2017-12-17 DIAGNOSIS — H2511 Age-related nuclear cataract, right eye: Secondary | ICD-10-CM | POA: Diagnosis not present

## 2017-12-30 DIAGNOSIS — E113313 Type 2 diabetes mellitus with moderate nonproliferative diabetic retinopathy with macular edema, bilateral: Secondary | ICD-10-CM | POA: Diagnosis not present

## 2018-01-08 DIAGNOSIS — M48062 Spinal stenosis, lumbar region with neurogenic claudication: Secondary | ICD-10-CM | POA: Diagnosis not present

## 2018-01-08 DIAGNOSIS — M5136 Other intervertebral disc degeneration, lumbar region: Secondary | ICD-10-CM | POA: Diagnosis not present

## 2018-01-08 DIAGNOSIS — M5416 Radiculopathy, lumbar region: Secondary | ICD-10-CM | POA: Diagnosis not present

## 2018-01-09 ENCOUNTER — Encounter: Payer: Self-pay | Admitting: *Deleted

## 2018-01-09 ENCOUNTER — Other Ambulatory Visit: Payer: Self-pay

## 2018-01-09 NOTE — Discharge Instructions (Signed)

## 2018-01-13 NOTE — Anesthesia Preprocedure Evaluation (Addendum)
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 Was seen by neurology attending during the hospitalization. Patient received aspirin and Crestor during the hospitalization. Patient was worked up with CT head, MRI and MRA brain which showed acute CVA. He had carotid ultrasound which showed no significant stenosis but nonocclusive DVT in the internal jugular vein in the left side. Patient also had work-up with left upper extremity Doppler venous ultrasound which showed no DVT. consult was done with vascular surgery also for nonocclusive DVT in the internal jugular vein. They recommended only aspirin and no anticoagulation. No surgical intervention recommended. Patient will be discharged home with home health services, physical therapy, occupational therapy and speech therapy.   Reproductive/Obstetrics                            Anesthesia Physical Anesthesia Plan  ASA:  III  Anesthesia Plan: MAC   Post-op Pain Management:    Induction: Intravenous  PONV Risk Score and Plan: 1 and Midazolam and TIVA  Airway Management Planned: Natural Airway  Additional Equipment:   Intra-op Plan:   Post-operative Plan:   Informed Consent: I have reviewed the patients History and Physical, chart, labs and discussed the procedure including the risks, benefits and alternatives for the proposed anesthesia with the patient or authorized representative who has indicated his/her understanding and acceptance.     Plan Discussed with: CRNA  Anesthesia Plan Comments:        Anesthesia Quick Evaluation  

## 2018-01-15 ENCOUNTER — Encounter
Admission: RE | Disposition: A | Discharge status: Home / Self Care | Payer: Self-pay | Source: Home / Self Care | Attending: Ophthalmology

## 2018-01-15 ENCOUNTER — Ambulatory Visit: Payer: PPO | Admitting: Anesthesiology

## 2018-01-15 ENCOUNTER — Ambulatory Visit
Admission: RE | Admit: 2018-01-15 | Discharge: 2018-01-15 | Disposition: A | Discharge status: Home / Self Care | Payer: PPO | Attending: Ophthalmology | Admitting: Ophthalmology

## 2018-01-15 DIAGNOSIS — E1136 Type 2 diabetes mellitus with diabetic cataract: Secondary | ICD-10-CM | POA: Insufficient documentation

## 2018-01-15 DIAGNOSIS — Z79899 Other long term (current) drug therapy: Secondary | ICD-10-CM | POA: Diagnosis not present

## 2018-01-15 DIAGNOSIS — E1122 Type 2 diabetes mellitus with diabetic chronic kidney disease: Secondary | ICD-10-CM | POA: Insufficient documentation

## 2018-01-15 DIAGNOSIS — Z96643 Presence of artificial hip joint, bilateral: Secondary | ICD-10-CM | POA: Diagnosis not present

## 2018-01-15 DIAGNOSIS — Z96653 Presence of artificial knee joint, bilateral: Secondary | ICD-10-CM | POA: Insufficient documentation

## 2018-01-15 DIAGNOSIS — E7800 Pure hypercholesterolemia, unspecified: Secondary | ICD-10-CM | POA: Diagnosis not present

## 2018-01-15 DIAGNOSIS — H25811 Combined forms of age-related cataract, right eye: Secondary | ICD-10-CM | POA: Diagnosis not present

## 2018-01-15 DIAGNOSIS — I129 Hypertensive chronic kidney disease with stage 1 through stage 4 chronic kidney disease, or unspecified chronic kidney disease: Secondary | ICD-10-CM | POA: Diagnosis not present

## 2018-01-15 DIAGNOSIS — H2511 Age-related nuclear cataract, right eye: Secondary | ICD-10-CM | POA: Insufficient documentation

## 2018-01-15 DIAGNOSIS — Z7982 Long term (current) use of aspirin: Secondary | ICD-10-CM | POA: Insufficient documentation

## 2018-01-15 DIAGNOSIS — Z8673 Personal history of transient ischemic attack (TIA), and cerebral infarction without residual deficits: Secondary | ICD-10-CM | POA: Insufficient documentation

## 2018-01-15 DIAGNOSIS — N183 Chronic kidney disease, stage 3 (moderate): Secondary | ICD-10-CM | POA: Insufficient documentation

## 2018-01-15 HISTORY — PX: CATARACT EXTRACTION W/PHACO: SHX586

## 2018-01-15 LAB — GLUCOSE, CAPILLARY: Glucose-Capillary: 229 mg/dL — ABNORMAL HIGH (ref 70–99)

## 2018-01-15 SURGERY — PHACOEMULSIFICATION, CATARACT, WITH IOL INSERTION
Anesthesia: Monitor Anesthesia Care | Site: Eye | Laterality: Right

## 2018-01-15 MED ORDER — MIDAZOLAM HCL 2 MG/2ML IJ SOLN
INTRAMUSCULAR | Status: DC | PRN
  Administered 2018-01-15 (×2): 1 mg via INTRAVENOUS

## 2018-01-15 MED ORDER — NA HYALUR & NA CHOND-NA HYALUR 0.4-0.35 ML IO KIT
PACK | INTRAOCULAR | Status: DC | PRN
  Administered 2018-01-15: 1 mL via INTRAOCULAR

## 2018-01-15 MED ORDER — ACETAMINOPHEN 160 MG/5ML PO SOLN: 325.0000 mg | ORAL | Status: DC | PRN

## 2018-01-15 MED ORDER — ARMC OPHTHALMIC DILATING DROPS
1.0000 "application " | OPHTHALMIC | Status: DC | PRN
  Administered 2018-01-15 (×3): 1 via OPHTHALMIC

## 2018-01-15 MED ORDER — ACETAMINOPHEN 325 MG PO TABS: 650.0000 mg | ORAL_TABLET | Freq: Once | ORAL | Status: DC | PRN

## 2018-01-15 MED ORDER — TETRACAINE HCL 0.5 % OP SOLN
1.0000 [drp] | OPHTHALMIC | Status: DC | PRN
  Administered 2018-01-15 (×2): 1 [drp] via OPHTHALMIC

## 2018-01-15 MED ORDER — EPINEPHRINE PF 1 MG/ML IJ SOLN
INTRAOCULAR | Status: DC | PRN
  Administered 2018-01-15: 46 mL via OPHTHALMIC

## 2018-01-15 MED ORDER — NON FORMULARY
5.0000 [IU] | Freq: Once | Status: AC
  Administered 2018-01-15: 5 [IU] via INTRAVENOUS

## 2018-01-15 MED ORDER — CEFUROXIME OPHTHALMIC INJECTION 1 MG/0.1 ML
INJECTION | OPHTHALMIC | Status: DC | PRN
  Administered 2018-01-15: 0.1 mL via INTRACAMERAL

## 2018-01-15 MED ORDER — LACTATED RINGERS IV SOLN: INTRAVENOUS | Status: DC

## 2018-01-15 MED ORDER — FENTANYL CITRATE (PF) 100 MCG/2ML IJ SOLN
INTRAMUSCULAR | Status: DC | PRN
  Administered 2018-01-15 (×2): 50 ug via INTRAVENOUS

## 2018-01-15 MED ORDER — BRIMONIDINE TARTRATE-TIMOLOL 0.2-0.5 % OP SOLN
OPHTHALMIC | Status: DC | PRN
  Administered 2018-01-15: 1 [drp] via OPHTHALMIC

## 2018-01-15 MED ORDER — MOXIFLOXACIN HCL 0.5 % OP SOLN
1.0000 [drp] | OPHTHALMIC | Status: DC | PRN
  Administered 2018-01-15 (×3): 1 [drp] via OPHTHALMIC

## 2018-01-15 MED ORDER — LIDOCAINE HCL (PF) 2 % IJ SOLN
INTRAOCULAR | Status: DC | PRN
  Administered 2018-01-15: 1 mL

## 2018-01-15 MED ORDER — ONDANSETRON HCL 4 MG/2ML IJ SOLN: 4.0000 mg | Freq: Once | INTRAMUSCULAR | Status: DC | PRN

## 2018-01-15 SURGICAL SUPPLY — 26 items
CANNULA ANT/CHMB 27G (MISCELLANEOUS) ×1 IMPLANT
CANNULA ANT/CHMB 27GA (MISCELLANEOUS) ×3 IMPLANT
GLOVE SURG LX 7.5 STRW (GLOVE) ×2
GLOVE SURG LX STRL 7.5 STRW (GLOVE) ×1 IMPLANT
GLOVE SURG TRIUMPH 8.0 PF LTX (GLOVE) ×3 IMPLANT
GOWN STRL REUS W/ TWL LRG LVL3 (GOWN DISPOSABLE) ×2 IMPLANT
GOWN STRL REUS W/TWL LRG LVL3 (GOWN DISPOSABLE) ×4
LENS IOL TECNIS ITEC 21.5 (Intraocular Lens) ×2 IMPLANT
MARKER SKIN DUAL TIP RULER LAB (MISCELLANEOUS) ×3 IMPLANT
NDL FILTER BLUNT 18X1 1/2 (NEEDLE) ×1 IMPLANT
NDL RETROBULBAR .5 NSTRL (NEEDLE) IMPLANT
NEEDLE FILTER BLUNT 18X 1/2SAF (NEEDLE) ×2
NEEDLE FILTER BLUNT 18X1 1/2 (NEEDLE) ×1 IMPLANT
PACK CATARACT BRASINGTON (MISCELLANEOUS) ×3 IMPLANT
PACK EYE AFTER SURG (MISCELLANEOUS) ×3 IMPLANT
PACK OPTHALMIC (MISCELLANEOUS) ×3 IMPLANT
RING MALYGIN 7.0 (MISCELLANEOUS) IMPLANT
SUT ETHILON 10-0 CS-B-6CS-B-6 (SUTURE)
SUT VICRYL  9 0 (SUTURE)
SUT VICRYL 9 0 (SUTURE) IMPLANT
SUTURE EHLN 10-0 CS-B-6CS-B-6 (SUTURE) IMPLANT
SYR 3ML LL SCALE MARK (SYRINGE) ×3 IMPLANT
SYR 5ML LL (SYRINGE) ×3 IMPLANT
SYR TB 1ML LUER SLIP (SYRINGE) ×3 IMPLANT
WATER STERILE IRR 500ML POUR (IV SOLUTION) ×3 IMPLANT
WIPE NON LINTING 3.25X3.25 (MISCELLANEOUS) ×3 IMPLANT

## 2018-01-15 NOTE — H&P (Signed)

## 2018-01-15 NOTE — Anesthesia Postprocedure Evaluation (Signed)
Anesthesia Post Note  Patient: Michael Humphrey  Procedure(s) Performed: CATARACT EXTRACTION PHACO AND INTRAOCULAR LENS PLACEMENT (IOC)  RIGHT DIABETIC  leave so arrival will be around 7:45 ds (Right Eye)  Patient location during evaluation: PACU Anesthesia Type: MAC Level of consciousness: awake and alert, oriented and patient cooperative Pain management: pain level controlled Vital Signs Assessment: post-procedure vital signs reviewed and stable Respiratory status: spontaneous breathing, nonlabored ventilation and respiratory function stable Cardiovascular status: blood pressure returned to baseline and stable Postop Assessment: adequate PO intake Anesthetic complications: no    Darrin Nipper

## 2018-01-15 NOTE — Transfer of Care (Signed)
Immediate Anesthesia Transfer of Care Note  Patient: Michael Humphrey  Procedure(s) Performed: CATARACT EXTRACTION PHACO AND INTRAOCULAR LENS PLACEMENT (IOC)  RIGHT DIABETIC  leave so arrival will be around 7:45 ds (Right Eye)  Patient Location: PACU  Anesthesia Type: MAC  Level of Consciousness: awake, alert  and patient cooperative  Airway and Oxygen Therapy: Patient Spontanous Breathing and Patient connected to supplemental oxygen  Post-op Assessment: Post-op Vital signs reviewed, Patient's Cardiovascular Status Stable, Respiratory Function Stable, Patent Airway and No signs of Nausea or vomiting  Post-op Vital Signs: Reviewed and stable  Complications: No apparent anesthesia complications

## 2018-01-15 NOTE — Op Note (Signed)
LOCATION:  [place]   PREOPERATIVE DIAGNOSIS:    Nuclear sclerotic cataract right eye. H25.11   POSTOPERATIVE DIAGNOSIS:  Nuclear sclerotic cataract right eye.     PROCEDURE:  Phacoemusification with posterior chamber intraocular lens placement of the right eye   LENS:   Implant Name Type Inv. Item Serial No. Manufacturer Lot No. LRB No. Used  LENS IOL DIOP 21.5 - [id number] Intraocular Lens LENS IOL DIOP 21.5 [id number]  Right 1        ULTRASOUND TIME: 20 % of 0 minutes, 54 seconds.  CDE 10.8   SURGEON:  Wyonia Hough, MD   ANESTHESIA:  Topical with tetracaine drops and 2% Xylocaine jelly, augmented with 1% preservative-free intracameral lidocaine.    COMPLICATIONS:  None.   DESCRIPTION OF PROCEDURE:  The patient was identified in the holding room and transported to the operating room and placed in the supine position under the operating microscope.  The right eye was identified as the operative eye and it was prepped and draped in the usual sterile ophthalmic fashion.   A 1 millimeter clear-corneal paracentesis was made at the 12:00 position.  0.5 ml of preservative-free 1% lidocaine was injected into the anterior chamber. The anterior chamber was filled with Viscoat viscoelastic.  A 2.4 millimeter keratome was used to make a near-clear corneal incision at the 9:00 position.  A curvilinear capsulorrhexis was made with a cystotome and capsulorrhexis forceps.  Balanced salt solution was used to hydrodissect and hydrodelineate the nucleus.   Phacoemulsification was then used in stop and chop fashion to remove the lens nucleus and epinucleus.  The remaining cortex was then removed using the irrigation and aspiration handpiece. Provisc was then placed into the capsular bag to distend it for lens placement.  A lens was then injected into the capsular bag.  The remaining viscoelastic was aspirated.   Wounds were hydrated with balanced salt solution.  The anterior  chamber was inflated to a physiologic pressure with balanced salt solution.  No wound leaks were noted. Cefuroxime 0.1 ml of a 10mg /ml solution was injected into the anterior chamber for a dose of 1 mg of intracameral antibiotic at the completion of the case.   Timolol and Brimonidine drops were applied to the eye.  The patient was taken to the recovery room in stable condition without complications of anesthesia or surgery.   Savio Albrecht 01/15/2018, 9:30 AM

## 2018-01-16 ENCOUNTER — Encounter: Payer: Self-pay | Admitting: Ophthalmology

## 2018-01-30 DIAGNOSIS — R600 Localized edema: Secondary | ICD-10-CM | POA: Diagnosis not present

## 2018-02-10 DIAGNOSIS — Z125 Encounter for screening for malignant neoplasm of prostate: Secondary | ICD-10-CM | POA: Diagnosis not present

## 2018-02-10 DIAGNOSIS — I10 Essential (primary) hypertension: Secondary | ICD-10-CM | POA: Diagnosis not present

## 2018-02-10 DIAGNOSIS — E1122 Type 2 diabetes mellitus with diabetic chronic kidney disease: Secondary | ICD-10-CM | POA: Diagnosis not present

## 2018-02-10 DIAGNOSIS — N183 Chronic kidney disease, stage 3 (moderate): Secondary | ICD-10-CM | POA: Diagnosis not present

## 2018-02-10 DIAGNOSIS — I129 Hypertensive chronic kidney disease with stage 1 through stage 4 chronic kidney disease, or unspecified chronic kidney disease: Secondary | ICD-10-CM | POA: Diagnosis not present

## 2018-02-10 DIAGNOSIS — I739 Peripheral vascular disease, unspecified: Secondary | ICD-10-CM | POA: Diagnosis not present

## 2018-02-10 DIAGNOSIS — Z789 Other specified health status: Secondary | ICD-10-CM | POA: Diagnosis not present

## 2018-02-14 DIAGNOSIS — Z961 Presence of intraocular lens: Secondary | ICD-10-CM | POA: Diagnosis not present

## 2018-02-14 DIAGNOSIS — E113311 Type 2 diabetes mellitus with moderate nonproliferative diabetic retinopathy with macular edema, right eye: Secondary | ICD-10-CM | POA: Diagnosis not present

## 2018-02-17 DIAGNOSIS — Z789 Other specified health status: Secondary | ICD-10-CM | POA: Diagnosis not present

## 2018-02-17 DIAGNOSIS — Z6835 Body mass index (BMI) 35.0-35.9, adult: Secondary | ICD-10-CM | POA: Diagnosis not present

## 2018-02-17 DIAGNOSIS — E1122 Type 2 diabetes mellitus with diabetic chronic kidney disease: Secondary | ICD-10-CM | POA: Diagnosis not present

## 2018-02-17 DIAGNOSIS — I739 Peripheral vascular disease, unspecified: Secondary | ICD-10-CM | POA: Diagnosis not present

## 2018-02-17 DIAGNOSIS — I10 Essential (primary) hypertension: Secondary | ICD-10-CM | POA: Diagnosis not present

## 2018-02-17 DIAGNOSIS — N183 Chronic kidney disease, stage 3 (moderate): Secondary | ICD-10-CM | POA: Diagnosis not present

## 2018-02-17 DIAGNOSIS — E7800 Pure hypercholesterolemia, unspecified: Secondary | ICD-10-CM | POA: Diagnosis not present

## 2018-02-17 DIAGNOSIS — G4733 Obstructive sleep apnea (adult) (pediatric): Secondary | ICD-10-CM | POA: Diagnosis not present

## 2018-02-17 DIAGNOSIS — I129 Hypertensive chronic kidney disease with stage 1 through stage 4 chronic kidney disease, or unspecified chronic kidney disease: Secondary | ICD-10-CM | POA: Diagnosis not present

## 2018-02-26 ENCOUNTER — Emergency Department: Payer: PPO

## 2018-02-26 ENCOUNTER — Emergency Department
Admission: EM | Admit: 2018-02-26 | Discharge: 2018-02-26 | Disposition: A | Discharge status: Home / Self Care | Payer: PPO | Source: Home / Self Care | Attending: Emergency Medicine | Admitting: Emergency Medicine

## 2018-02-26 ENCOUNTER — Encounter: Payer: Self-pay | Admitting: *Deleted

## 2018-02-26 ENCOUNTER — Inpatient Hospital Stay
Admission: EM | Admit: 2018-02-26 | Discharge: 2018-02-28 | DRG: 176 | Disposition: A | Discharge status: Home / Self Care | Payer: PPO | Attending: Specialist | Admitting: Specialist

## 2018-02-26 ENCOUNTER — Other Ambulatory Visit: Payer: Self-pay

## 2018-02-26 DIAGNOSIS — Z886 Allergy status to analgesic agent status: Secondary | ICD-10-CM

## 2018-02-26 DIAGNOSIS — I2609 Other pulmonary embolism with acute cor pulmonale: Secondary | ICD-10-CM | POA: Diagnosis not present

## 2018-02-26 DIAGNOSIS — E860 Dehydration: Secondary | ICD-10-CM

## 2018-02-26 DIAGNOSIS — Z79899 Other long term (current) drug therapy: Secondary | ICD-10-CM

## 2018-02-26 DIAGNOSIS — I110 Hypertensive heart disease with heart failure: Secondary | ICD-10-CM | POA: Diagnosis not present

## 2018-02-26 DIAGNOSIS — R42 Dizziness and giddiness: Secondary | ICD-10-CM

## 2018-02-26 DIAGNOSIS — Z66 Do not resuscitate: Secondary | ICD-10-CM | POA: Diagnosis present

## 2018-02-26 DIAGNOSIS — R634 Abnormal weight loss: Secondary | ICD-10-CM | POA: Diagnosis not present

## 2018-02-26 DIAGNOSIS — Z9049 Acquired absence of other specified parts of digestive tract: Secondary | ICD-10-CM | POA: Diagnosis not present

## 2018-02-26 DIAGNOSIS — K5730 Diverticulosis of large intestine without perforation or abscess without bleeding: Secondary | ICD-10-CM | POA: Diagnosis not present

## 2018-02-26 DIAGNOSIS — I2694 Multiple subsegmental pulmonary emboli without acute cor pulmonale: Secondary | ICD-10-CM | POA: Diagnosis not present

## 2018-02-26 DIAGNOSIS — Z87891 Personal history of nicotine dependence: Secondary | ICD-10-CM

## 2018-02-26 DIAGNOSIS — I2699 Other pulmonary embolism without acute cor pulmonale: Secondary | ICD-10-CM | POA: Diagnosis not present

## 2018-02-26 DIAGNOSIS — Z832 Family history of diseases of the blood and blood-forming organs and certain disorders involving the immune mechanism: Secondary | ICD-10-CM

## 2018-02-26 DIAGNOSIS — Z885 Allergy status to narcotic agent status: Secondary | ICD-10-CM | POA: Diagnosis not present

## 2018-02-26 DIAGNOSIS — I5032 Chronic diastolic (congestive) heart failure: Secondary | ICD-10-CM | POA: Diagnosis not present

## 2018-02-26 DIAGNOSIS — E785 Hyperlipidemia, unspecified: Secondary | ICD-10-CM | POA: Diagnosis not present

## 2018-02-26 DIAGNOSIS — R310 Gross hematuria: Secondary | ICD-10-CM | POA: Insufficient documentation

## 2018-02-26 DIAGNOSIS — R0902 Hypoxemia: Secondary | ICD-10-CM | POA: Diagnosis not present

## 2018-02-26 DIAGNOSIS — W19XXXA Unspecified fall, initial encounter: Secondary | ICD-10-CM

## 2018-02-26 DIAGNOSIS — Z8673 Personal history of transient ischemic attack (TIA), and cerebral infarction without residual deficits: Secondary | ICD-10-CM

## 2018-02-26 DIAGNOSIS — Z888 Allergy status to other drugs, medicaments and biological substances status: Secondary | ICD-10-CM | POA: Diagnosis not present

## 2018-02-26 DIAGNOSIS — R296 Repeated falls: Secondary | ICD-10-CM | POA: Diagnosis present

## 2018-02-26 DIAGNOSIS — Z7982 Long term (current) use of aspirin: Secondary | ICD-10-CM

## 2018-02-26 DIAGNOSIS — R319 Hematuria, unspecified: Secondary | ICD-10-CM | POA: Diagnosis not present

## 2018-02-26 DIAGNOSIS — Z86711 Personal history of pulmonary embolism: Secondary | ICD-10-CM | POA: Diagnosis not present

## 2018-02-26 DIAGNOSIS — Z6831 Body mass index (BMI) 31.0-31.9, adult: Secondary | ICD-10-CM | POA: Diagnosis not present

## 2018-02-26 DIAGNOSIS — E7800 Pure hypercholesterolemia, unspecified: Secondary | ICD-10-CM | POA: Diagnosis present

## 2018-02-26 DIAGNOSIS — E119 Type 2 diabetes mellitus without complications: Secondary | ICD-10-CM | POA: Insufficient documentation

## 2018-02-26 DIAGNOSIS — Z7984 Long term (current) use of oral hypoglycemic drugs: Secondary | ICD-10-CM | POA: Diagnosis not present

## 2018-02-26 DIAGNOSIS — Z8249 Family history of ischemic heart disease and other diseases of the circulatory system: Secondary | ICD-10-CM | POA: Diagnosis not present

## 2018-02-26 DIAGNOSIS — I10 Essential (primary) hypertension: Secondary | ICD-10-CM

## 2018-02-26 DIAGNOSIS — E1165 Type 2 diabetes mellitus with hyperglycemia: Secondary | ICD-10-CM | POA: Diagnosis not present

## 2018-02-26 DIAGNOSIS — N401 Enlarged prostate with lower urinary tract symptoms: Secondary | ICD-10-CM | POA: Diagnosis present

## 2018-02-26 DIAGNOSIS — Z86718 Personal history of other venous thrombosis and embolism: Secondary | ICD-10-CM

## 2018-02-26 DIAGNOSIS — Z96651 Presence of right artificial knee joint: Secondary | ICD-10-CM | POA: Diagnosis present

## 2018-02-26 DIAGNOSIS — R350 Frequency of micturition: Secondary | ICD-10-CM | POA: Diagnosis not present

## 2018-02-26 DIAGNOSIS — R8290 Unspecified abnormal findings in urine: Secondary | ICD-10-CM | POA: Diagnosis not present

## 2018-02-26 DIAGNOSIS — R000 Tachycardia, unspecified: Secondary | ICD-10-CM

## 2018-02-26 DIAGNOSIS — R32 Unspecified urinary incontinence: Secondary | ICD-10-CM | POA: Diagnosis not present

## 2018-02-26 LAB — GLUCOSE, CAPILLARY: Glucose-Capillary: 203 mg/dL — ABNORMAL HIGH (ref 70–99)

## 2018-02-26 LAB — URINE DRUG SCREEN, QUALITATIVE (ARMC ONLY)
Amphetamines, Ur Screen: NOT DETECTED
Barbiturates, Ur Screen: NOT DETECTED
Benzodiazepine, Ur Scrn: NOT DETECTED
COCAINE METABOLITE, UR ~~LOC~~: NOT DETECTED
Cannabinoid 50 Ng, Ur ~~LOC~~: NOT DETECTED
MDMA (Ecstasy)Ur Screen: NOT DETECTED
Methadone Scn, Ur: NOT DETECTED
Opiate, Ur Screen: NOT DETECTED
Phencyclidine (PCP) Ur S: NOT DETECTED
Tricyclic, Ur Screen: NOT DETECTED

## 2018-02-26 LAB — URINALYSIS, COMPLETE (UACMP) WITH MICROSCOPIC
BACTERIA UA: NONE SEEN
Bacteria, UA: NONE SEEN
Bilirubin Urine: NEGATIVE
Bilirubin Urine: NEGATIVE
GLUCOSE, UA: 150 mg/dL — AB
Glucose, UA: 150 mg/dL — AB
Ketones, ur: 5 mg/dL — AB
Ketones, ur: 5 mg/dL — AB
Leukocytes,Ua: NEGATIVE
Leukocytes,Ua: NEGATIVE
NITRITE: NEGATIVE
Nitrite: NEGATIVE
Protein, ur: 100 mg/dL — AB
Protein, ur: 100 mg/dL — AB
RBC / HPF: >50 RBC/hpf — ABNORMAL HIGH (ref 0–5)
RBC / HPF: >50 RBC/hpf — ABNORMAL HIGH (ref 0–5)
Specific Gravity, Urine: 1.023 (ref 1.005–1.030)
Specific Gravity, Urine: 1.025 (ref 1.005–1.030)
pH: 5 (ref 5.0–8.0)
pH: 5 (ref 5.0–8.0)

## 2018-02-26 LAB — CBC WITH DIFFERENTIAL/PLATELET
Abs Immature Granulocytes: 0.06 10*3/uL (ref 0.00–0.07)
Basophils Absolute: 0 10*3/uL (ref 0.0–0.1)
Basophils Relative: 0 %
Eosinophils Absolute: 0.2 10*3/uL (ref 0.0–0.5)
Eosinophils Relative: 2 %
HCT: 47.3 % (ref 39.0–52.0)
Hemoglobin: 16.7 g/dL (ref 13.0–17.0)
Immature Granulocytes: 1 %
LYMPHS PCT: 8 %
Lymphs Abs: 0.8 10*3/uL (ref 0.7–4.0)
MCH: 32.1 pg (ref 26.0–34.0)
MCHC: 35.3 g/dL (ref 30.0–36.0)
MCV: 90.8 fL (ref 80.0–100.0)
Monocytes Absolute: 1.4 10*3/uL — ABNORMAL HIGH (ref 0.1–1.0)
Monocytes Relative: 14 %
NEUTROS PCT: 75 %
Neutro Abs: 7.7 10*3/uL (ref 1.7–7.7)
Platelets: 148 10*3/uL — ABNORMAL LOW (ref 150–400)
RBC: 5.21 MIL/uL (ref 4.22–5.81)
RDW: 13.2 % (ref 11.5–15.5)
WBC: 10.2 10*3/uL (ref 4.0–10.5)
nRBC: 0 % (ref 0.0–0.2)

## 2018-02-26 LAB — CBC
HCT: 49.3 % (ref 39.0–52.0)
HEMOGLOBIN: 17.5 g/dL — AB (ref 13.0–17.0)
MCH: 32.5 pg (ref 26.0–34.0)
MCHC: 35.5 g/dL (ref 30.0–36.0)
MCV: 91.5 fL (ref 80.0–100.0)
Platelets: 161 10*3/uL (ref 150–400)
RBC: 5.39 MIL/uL (ref 4.22–5.81)
RDW: 13.3 % (ref 11.5–15.5)
WBC: 8.9 10*3/uL (ref 4.0–10.5)
nRBC: 0 % (ref 0.0–0.2)

## 2018-02-26 LAB — COMPREHENSIVE METABOLIC PANEL
ALT: 27 U/L (ref 0–44)
AST: 34 U/L (ref 15–41)
Albumin: 4.1 g/dL (ref 3.5–5.0)
Alkaline Phosphatase: 96 U/L (ref 38–126)
Anion gap: 12 (ref 5–15)
BUN: 24 mg/dL — ABNORMAL HIGH (ref 8–23)
CO2: 23 mmol/L (ref 22–32)
Calcium: 9.4 mg/dL (ref 8.9–10.3)
Chloride: 98 mmol/L (ref 98–111)
Creatinine, Ser: 1.3 mg/dL — ABNORMAL HIGH (ref 0.61–1.24)
GFR calc Af Amer: >60 mL/min (ref >60)
GFR calc non Af Amer: 57 mL/min — ABNORMAL LOW (ref >60)
Glucose, Bld: 314 mg/dL — ABNORMAL HIGH (ref 70–99)
Potassium: 4.1 mmol/L (ref 3.5–5.1)
SODIUM: 133 mmol/L — AB (ref 135–145)
Total Bilirubin: 1.2 mg/dL (ref 0.3–1.2)
Total Protein: 7 g/dL (ref 6.5–8.1)

## 2018-02-26 LAB — BASIC METABOLIC PANEL
Anion gap: 12 (ref 5–15)
BUN: 31 mg/dL — ABNORMAL HIGH (ref 8–23)
CO2: 21 mmol/L — ABNORMAL LOW (ref 22–32)
Calcium: 9.4 mg/dL (ref 8.9–10.3)
Chloride: 102 mmol/L (ref 98–111)
Creatinine, Ser: 1.3 mg/dL — ABNORMAL HIGH (ref 0.61–1.24)
GFR calc Af Amer: >60 mL/min (ref >60)
GFR calc non Af Amer: 57 mL/min — ABNORMAL LOW (ref >60)
Glucose, Bld: 284 mg/dL — ABNORMAL HIGH (ref 70–99)
Potassium: 3.8 mmol/L (ref 3.5–5.1)
Sodium: 135 mmol/L (ref 135–145)

## 2018-02-26 LAB — APTT: aPTT: 29 seconds (ref 24–36)

## 2018-02-26 LAB — PROTIME-INR
INR: 1.1 (ref 0.8–1.2)
PROTHROMBIN TIME: 13.9 s (ref 11.4–15.2)

## 2018-02-26 LAB — LACTIC ACID, PLASMA
Lactic Acid, Venous: 1.5 mmol/L (ref 0.5–1.9)
Lactic Acid, Venous: 1.9 mmol/L (ref 0.5–1.9)

## 2018-02-26 LAB — TSH: TSH: 0.93 u[IU]/mL (ref 0.350–4.500)

## 2018-02-26 MED ORDER — GLYBURIDE-METFORMIN 2.5-500 MG PO TABS: 2.0000 | ORAL_TABLET | Freq: Two times a day (BID) | ORAL | Status: DC

## 2018-02-26 MED ORDER — ROSUVASTATIN CALCIUM 10 MG PO TABS
20.0000 mg | ORAL_TABLET | Freq: Every day | ORAL | Status: DC
  Administered 2018-02-27: 20 mg via ORAL
  Filled 2018-02-26: qty 2

## 2018-02-26 MED ORDER — HEPARIN (PORCINE) 25000 UT/250ML-% IV SOLN
1150.0000 [IU]/h | INTRAVENOUS | Status: DC
  Administered 2018-02-26: 1450 [IU]/h via INTRAVENOUS
  Administered 2018-02-27: 1350 [IU]/h via INTRAVENOUS
  Filled 2018-02-26 (×2): qty 250

## 2018-02-26 MED ORDER — INSULIN ASPART 100 UNIT/ML ~~LOC~~ SOLN
0.0000 [IU] | Freq: Three times a day (TID) | SUBCUTANEOUS | Status: DC
  Administered 2018-02-27 (×2): 1 [IU] via SUBCUTANEOUS
  Administered 2018-02-28: 3 [IU] via SUBCUTANEOUS
  Administered 2018-02-28: 2 [IU] via SUBCUTANEOUS
  Filled 2018-02-26 (×4): qty 1

## 2018-02-26 MED ORDER — ASPIRIN EC 81 MG PO TBEC
81.0000 mg | DELAYED_RELEASE_TABLET | Freq: Every day | ORAL | Status: DC
  Administered 2018-02-27 – 2018-02-28 (×2): 81 mg via ORAL
  Filled 2018-02-26 (×2): qty 1

## 2018-02-26 MED ORDER — TORSEMIDE 20 MG PO TABS
60.0000 mg | ORAL_TABLET | Freq: Every day | ORAL | Status: DC
  Administered 2018-02-27 – 2018-02-28 (×2): 60 mg via ORAL
  Filled 2018-02-26 (×2): qty 3

## 2018-02-26 MED ORDER — GABAPENTIN 300 MG PO CAPS
300.0000 mg | ORAL_CAPSULE | Freq: Three times a day (TID) | ORAL | Status: DC
  Administered 2018-02-26 – 2018-02-28 (×5): 300 mg via ORAL
  Filled 2018-02-26 (×5): qty 1

## 2018-02-26 MED ORDER — LISINOPRIL 5 MG PO TABS
2.5000 mg | ORAL_TABLET | Freq: Every day | ORAL | Status: DC
  Administered 2018-02-27 – 2018-02-28 (×2): 2.5 mg via ORAL
  Filled 2018-02-26 (×2): qty 1

## 2018-02-26 MED ORDER — CARVEDILOL 3.125 MG PO TABS
3.1250 mg | ORAL_TABLET | Freq: Two times a day (BID) | ORAL | Status: DC
  Administered 2018-02-26 – 2018-02-28 (×4): 3.125 mg via ORAL
  Filled 2018-02-26 (×4): qty 1

## 2018-02-26 MED ORDER — SODIUM CHLORIDE 0.9 % IV BOLUS
500.0000 mL | Freq: Once | INTRAVENOUS | Status: AC
  Administered 2018-02-26: 500 mL via INTRAVENOUS

## 2018-02-26 MED ORDER — CYCLOBENZAPRINE HCL 10 MG PO TABS: 5.0000 mg | ORAL_TABLET | Freq: Every evening | ORAL | Status: DC | PRN

## 2018-02-26 MED ORDER — METOPROLOL TARTRATE 5 MG/5ML IV SOLN
5.0000 mg | Freq: Once | INTRAVENOUS | Status: AC
  Administered 2018-02-26: 5 mg via INTRAVENOUS
  Filled 2018-02-26: qty 5

## 2018-02-26 MED ORDER — DIAZEPAM 5 MG PO TABS: 5.0000 mg | ORAL_TABLET | Freq: Four times a day (QID) | ORAL | Status: DC | PRN

## 2018-02-26 MED ORDER — DOCUSATE SODIUM 100 MG PO CAPS: 100.0000 mg | ORAL_CAPSULE | Freq: Two times a day (BID) | ORAL | Status: DC | PRN

## 2018-02-26 MED ORDER — HEPARIN BOLUS VIA INFUSION
4000.0000 [IU] | Freq: Once | INTRAVENOUS | Status: AC
  Administered 2018-02-26: 4000 [IU] via INTRAVENOUS
  Filled 2018-02-26: qty 4000

## 2018-02-26 MED ORDER — SODIUM CHLORIDE 0.9 % IV BOLUS
1000.0000 mL | Freq: Once | INTRAVENOUS | Status: AC
  Administered 2018-02-26: 1000 mL via INTRAVENOUS

## 2018-02-26 MED ORDER — SPIRONOLACTONE 25 MG PO TABS
25.0000 mg | ORAL_TABLET | Freq: Every day | ORAL | Status: DC
  Administered 2018-02-27 – 2018-02-28 (×2): 25 mg via ORAL
  Filled 2018-02-26 (×2): qty 1

## 2018-02-26 MED ORDER — INSULIN ASPART 100 UNIT/ML ~~LOC~~ SOLN
0.0000 [IU] | Freq: Every day | SUBCUTANEOUS | Status: DC
  Administered 2018-02-26: 2 [IU] via SUBCUTANEOUS
  Administered 2018-02-27: 3 [IU] via SUBCUTANEOUS
  Filled 2018-02-26 (×2): qty 1

## 2018-02-26 MED ORDER — TRAZODONE HCL 100 MG PO TABS: 100.0000 mg | ORAL_TABLET | Freq: Every evening | ORAL | Status: DC | PRN

## 2018-02-26 MED ORDER — GUAIFENESIN-DM 100-10 MG/5ML PO SYRP
5.0000 mL | ORAL_SOLUTION | ORAL | Status: DC | PRN
  Filled 2018-02-26: qty 5

## 2018-02-26 MED ORDER — IOHEXOL 350 MG/ML SOLN
75.0000 mL | Freq: Once | INTRAVENOUS | Status: AC | PRN
  Administered 2018-02-26: 75 mL via INTRAVENOUS

## 2018-02-26 MED ORDER — GUAIFENESIN-CODEINE 100-10 MG/5ML PO SOLN
5.0000 mL | Freq: Four times a day (QID) | ORAL | Status: DC | PRN
  Administered 2018-02-27 (×2): 5 mL via ORAL
  Filled 2018-02-26 (×3): qty 5

## 2018-02-26 NOTE — ED Provider Notes (Addendum)
[facility] Emergency Department Provider Note  ____________________________________________   I have reviewed the triage vital signs and the nursing notes.   HISTORY  Chief Complaint Tachycardia   History limited by: Not Limited   HPI Michael Humphrey is a 68 y.o. male who presents to the emergency department today sent from PCP office because of tachycardia. Patient went to PCP as a follow up to ER visit yesterday. Came to the emergency department yesterday because of falls and hematuria. The patient first noticed the hematuria yesterday. Then had two falls. States he had not fallen in the past. He denies any chest pain or palpitations yesterday or today. Denies any headache. No fevers.    Per medical record review patient has a history of DVT, CVA  Past Medical History:  Diagnosis Date  . Arthritis   . Diabetes mellitus without complication ([place])   . DVT (deep venous thrombosis) ([place]) 07/2017   nonocclusive DVT left internal jugular   . High cholesterol   . Hypertension   . Lumbar radiculitis   . Stroke [facility])    07/2017    Patient Active Problem List   Diagnosis Date Noted  . CVA (cerebral vascular accident) ([place]) 07/15/2017    Past Surgical History:  Procedure Laterality Date  . BUNIONECTOMY    . CATARACT EXTRACTION W/PHACO Left 12/11/2017   Procedure: CATARACT EXTRACTION PHACO AND INTRAOCULAR LENS PLACEMENT ([place])  LEFT DIABETIC;  Surgeon: Leandrew Koyanagi, MD;  Location: [place];  Service: Ophthalmology;  Laterality: Left;  DIABETIC ([facility] picking pt up at 0600, needs 0630 arrival time)  . CATARACT EXTRACTION W/PHACO Right 01/15/2018   Procedure: CATARACT EXTRACTION PHACO AND INTRAOCULAR LENS PLACEMENT ([place])  RIGHT DIABETIC  leave so arrival will be around 7:45 ds;  Surgeon: Leandrew Koyanagi, MD;  Location: [place];  Service: Ophthalmology;  Laterality: Right;  Diabetic - oral meds  . CHOLECYSTECTOMY    .  COLONOSCOPY    . COLONOSCOPY WITH PROPOFOL N/A 12/17/2014   Procedure: COLONOSCOPY WITH PROPOFOL;  Surgeon: Lollie Sails, MD;  [facility] ENDOSCOPY;  Service: Endoscopy;  Laterality: N/A;  . correct hammertoe    . JOINT REPLACEMENT    . KNEE ARTHROSCOPY Right   . TOTAL HIP ARTHROPLASTY    . TOTAL KNEE ARTHROPLASTY Right     Prior to Admission medications   Medication Sig Start Date End Date Taking? Authorizing Provider  aspirin EC 81 MG tablet Take 81 mg by mouth daily.    [provider]  carvedilol (COREG) 3.125 MG tablet Take 3.125 mg by mouth 2 (two) times daily. 06/04/17   [provider]  cyclobenzaprine (FLEXERIL) 5 MG tablet Take 5-10 mg by mouth at bedtime as needed for muscle spasms.    [provider]  diazepam (VALIUM) 5 MG tablet Take 5 mg by mouth every 6 (six) hours as needed for anxiety.    [provider]  gabapentin (NEURONTIN) 300 MG capsule Take 300 mg by mouth 3 (three) times daily.    [provider]  glyBURIDE-metformin (GLUCOVANCE) 2.5-500 MG tablet Take 2 tablets by mouth 2 (two) times daily.    [provider]  guaiFENesin-codeine 100-10 MG/5ML syrup Take 5 mLs by mouth every 6 (six) hours as needed for cough. 02/12/17   Harvest Dark, MD  lisinopril (PRINIVIL,ZESTRIL) 2.5 MG tablet Take 2.5 mg by mouth daily.    [provider]  rosuvastatin (CRESTOR) 20 MG tablet Take 1 tablet (20 mg total) by  mouth daily. 07/18/17 01/09/18  Saundra Shelling, MD  spironolactone (ALDACTONE) 25 MG tablet Take 25 mg by mouth daily.    [provider]  torsemide (DEMADEX) 20 MG tablet Take 60 mg by mouth daily.     [provider]  traZODone (DESYREL) 100 MG tablet Take 1 tablet (100 mg total) by mouth at bedtime as needed for sleep. 07/17/17 01/09/18  Saundra Shelling, MD    Allergies Clonidine derivatives; Fioricet [butalbital-apap-caffeine]; Hydrocodone-acetaminophen; Mobic [meloxicam]; Norvasc  [amlodipine besylate]; Statins; and Tramadol  Family History  Problem Relation Age of Onset  . Clotting disorder Father   . Hypertension Father     Social History Social History   Tobacco Use  . Smoking status: Never Smoker  . Smokeless tobacco: Former Systems developer    Types: Chew  Substance Use Topics  . Alcohol use: No  . Drug use: No    Review of Systems Constitutional: No fever/chills Eyes: No visual changes. ENT: No sore throat. Cardiovascular: Denies chest pain. Respiratory: Denies shortness of breath. Gastrointestinal: No abdominal pain.  No nausea, no vomiting.  No diarrhea.   Genitourinary: Positive for hematuria.  Musculoskeletal: Negative for back pain. Skin: Negative for rash. Neurological: Negative for headaches, focal weakness or numbness.  ____________________________________________   PHYSICAL EXAM:  VITAL SIGNS: ED Triage Vitals  Enc Vitals Group     BP 02/26/18 1528 (!) 153/95     Pulse Rate 02/26/18 1528 (!) 148     Resp 02/26/18 1528 18     Temp 02/26/18 1528 98.3 F (36.8 C)     Temp Source 02/26/18 1528 Oral     SpO2 02/26/18 1528 96 %     Weight 02/26/18 1531 225 lb (102.1 kg)     Height 02/26/18 1531 5\' 9"  (1.753 m)     Head Circumference --      Peak Flow --      Pain Score 02/26/18 1531 5    Constitutional: Alert and oriented.  Eyes: Conjunctivae are normal.  ENT      Head: Normocephalic and atraumatic.      Nose: No congestion/rhinnorhea.      Mouth/Throat: Mucous membranes are moist.      Neck: No stridor. Hematological/Lymphatic/Immunilogical: No cervical lymphadenopathy. Cardiovascular: Tachycardia, regular rhythm.  No murmurs, rubs, or gallops.  Respiratory: Normal respiratory effort without tachypnea nor retractions. Breath sounds are clear and equal bilaterally. No wheezes/rales/rhonchi. Gastrointestinal: Soft and non tender. No rebound. No guarding.  Genitourinary: Deferred Musculoskeletal: Normal range of motion in all  extremities. No lower extremity edema. Neurologic:  Normal speech and language. No gross focal neurologic deficits are appreciated.  Skin:  Skin is warm, dry and intact. No rash noted. Psychiatric: Mood and affect are normal. Speech and behavior are normal. Patient exhibits appropriate insight and judgment.  ____________________________________________    LABS (pertinent positives/negatives)  CBC wbc 8.9, hgb 17.5, plt 161 BMP na 135, k 3.8, glu 284, cr 1.30  ____________________________________________   EKG  I, Nance Pear, attending physician, personally viewed and interpreted this EKG  EKG Time: 1522 Rate: 146 Rhythm: sinus tachycardia Axis: left axis deviation Intervals: qtc 426 QRS: narrow, q waves III, aVF ST changes: no st elevation Impression: abnormal ekg  I, Nance Pear, attending physician, personally viewed and interpreted this EKG  EKG Time: 1741 Rate: 84 Rhythm: sinus rhythm with PAC Axis: left axis deviation Intervals: qtc 415 QRS: narrow, q waves III, aVF ST changes: no st elevation Impression: abnormal ekg  ____________________________________________  RADIOLOGY  CT head No acute abnormality  CT chest Small left sided pulmonary embolisms ____________________________________________   PROCEDURES  Procedures  CRITICAL CARE Performed by: Nance Pear   Total critical care time: 35 minutes  Critical care time was exclusive of separately billable procedures and treating other patients.  Critical care was necessary to treat or prevent imminent or life-threatening deterioration.  Critical care was time spent personally by me on the following activities: development of treatment plan with patient and/or surrogate as well as nursing, discussions with consultants, evaluation of patient's response to treatment, examination of patient, obtaining history from patient or surrogate, ordering and performing treatments and  interventions, ordering and review of laboratory studies, ordering and review of radiographic studies, pulse oximetry and re-evaluation of patient's condition.  ____________________________________________   INITIAL IMPRESSION / ASSESSMENT AND PLAN / ED COURSE  Pertinent labs & imaging results that were available during my care of the patient were reviewed by me and considered in my medical decision making (see chart for details).   Patient presented to the emergency department today because of concerns for fast heart rate found at primary care physician's office.  Differential would be broad including dehydration, hyperthyroid, arrhythmia, electrolyte abnormality amongst other etiologies.  Patient's work-up initially without any obvious findings.  CT Angie was performed given concern for possible PE given lack of other obvious etiology.  This did show some small pulmonary embolisms.  Discussed this finding with the patient.  Given continued tachycardia will plan on admission and will begin heparin drip.   ____________________________________________   FINAL CLINICAL IMPRESSION(S) / ED DIAGNOSES  Final diagnoses:  Sinus tachycardia  Pulmonary embolism, other, unspecified chronicity, unspecified whether acute cor pulmonale present [facility])     Note: This dictation was prepared with Dragon dictation. Any transcriptional errors that result from this process are unintentional     Nance Pear, MD 02/26/18 1932    Nance Pear, MD 03/08/18 [phone]

## 2018-02-26 NOTE — ED Notes (Signed)
Patient transported to CT 

## 2018-02-26 NOTE — ED Notes (Signed)
Pt was able to ambulated with a steady gait. Pt used a cane to walk. Pt denies CP/SOB/Dizziness.  HR between 88 bpm-94 bpm. Pt's is sitting at the side of bed with a current HR of 127. Family at bedside.

## 2018-02-26 NOTE — Discharge Instructions (Signed)
You appear to be somewhat dehydrated today I am suspicious that made you fall so we gave you some IV fluids.  While you are in the emergency department he did have some blood in your urine but no evidence of urinary tract infection.  Please follow-up with your PCP today as scheduled

## 2018-02-26 NOTE — H&P (Signed)
[facility] [place] NAME: Michael Humphrey    MR#:  [id number]  DATE OF BIRTH:  Jan 06, 1950  DATE OF ADMISSION:  02/26/2018  PRIMARY CARE PHYSICIAN: Kirk Ruths, MD   REQUESTING/REFERRING PHYSICIAN: Archie Balboa  CHIEF COMPLAINT:   Chief Complaint  Patient presents with  . Tachycardia    HISTORY OF PRESENT ILLNESS: Michael Humphrey  is a 68 y.o. male with a known history of arthritis, diabetes, DVT, high cholesterol, hypertension, stroke-started feeling dizzy since yesterday and also had some complaint of burning with urination.  He came to emergency room but sent home as there was no significant abnormalities found. He went to see his primary care doctor today and after listening to his complaint he advised to go back to emergency room. Patient was noted to be tachycardic in ER.  CT scan of the chest was positive for the small pulmonary embolism.  CT scan of the head was negative for any acute findings.  Patient did not had any focal neurological symptoms.  He still have urinary symptoms and his urine is having 50 RBCs. Started on heparin drip by ER physician and due to his tachycardia and potential of getting converted to hematuria ER physician suggested to monitor under hospitalist service  PAST MEDICAL HISTORY:   Past Medical History:  Diagnosis Date  . Arthritis   . Diabetes mellitus without complication ([place])   . DVT (deep venous thrombosis) ([place]) 07/2017   nonocclusive DVT left internal jugular   . High cholesterol   . Hypertension   . Lumbar radiculitis   . Stroke [facility])    07/2017    PAST SURGICAL HISTORY:  Past Surgical History:  Procedure Laterality Date  . BUNIONECTOMY    . CATARACT EXTRACTION W/PHACO Left 12/11/2017   Procedure: CATARACT EXTRACTION PHACO AND INTRAOCULAR LENS PLACEMENT ([place])  LEFT DIABETIC;  Surgeon: Leandrew Koyanagi, MD;  Location: [place];  Service: Ophthalmology;  Laterality: Left;  DIABETIC  ([facility] picking pt up at 0600, needs 0630 arrival time)  . CATARACT EXTRACTION W/PHACO Right 01/15/2018   Procedure: CATARACT EXTRACTION PHACO AND INTRAOCULAR LENS PLACEMENT ([place])  RIGHT DIABETIC  leave so arrival will be around 7:45 ds;  Surgeon: Leandrew Koyanagi, MD;  Location: [place];  Service: Ophthalmology;  Laterality: Right;  Diabetic - oral meds  . CHOLECYSTECTOMY    . COLONOSCOPY    . COLONOSCOPY WITH PROPOFOL N/A 12/17/2014   Procedure: COLONOSCOPY WITH PROPOFOL;  Surgeon: Lollie Sails, MD;  Location: [facility];  Service: Endoscopy;  Laterality: N/A;  . correct hammertoe    . JOINT REPLACEMENT    . KNEE ARTHROSCOPY Right   . TOTAL HIP ARTHROPLASTY    . TOTAL KNEE ARTHROPLASTY Right     SOCIAL HISTORY:  Social History   Tobacco Use  . Smoking status: Never Smoker  . Smokeless tobacco: Former Systems developer    Types: Chew  Substance Use Topics  . Alcohol use: No    FAMILY HISTORY:  Family History  Problem Relation Age of Onset  . Clotting disorder Father   . Hypertension Father     DRUG ALLERGIES:  Allergies  Allergen Reactions  . Clonidine Derivatives     Hallucinations   . Fioricet [Butalbital-Apap-Caffeine]     hallucination  . Hydrocodone-Acetaminophen     hallucinations  . Mobic [Meloxicam]     hallucinations  . Norvasc [Amlodipine Besylate]     hallucinations  . Statins     Muscle  cramps  . Tramadol Itching    REVIEW OF SYSTEMS:   CONSTITUTIONAL: No fever, fatigue or weakness.  EYES: No blurred or double vision.  EARS, NOSE, AND THROAT: No tinnitus or ear pain.  RESPIRATORY: No cough, shortness of breath, wheezing or hemoptysis.  CARDIOVASCULAR: No chest pain, orthopnea, edema.  GASTROINTESTINAL: No nausea, vomiting, diarrhea or abdominal pain.  GENITOURINARY: No dysuria, hematuria.  ENDOCRINE: No polyuria, nocturia,  HEMATOLOGY: No anemia, easy bruising or bleeding SKIN: No rash or lesion. MUSCULOSKELETAL: No joint pain or  arthritis.   NEUROLOGIC: No tingling, numbness, weakness.  PSYCHIATRY: No anxiety or depression.   MEDICATIONS AT HOME:  Prior to Admission medications   Medication Sig Start Date End Date Taking? Authorizing Provider  aspirin EC 81 MG tablet Take 81 mg by mouth daily.    [provider]  carvedilol (COREG) 3.125 MG tablet Take 3.125 mg by mouth 2 (two) times daily. 06/04/17   [provider]  cyclobenzaprine (FLEXERIL) 5 MG tablet Take 5-10 mg by mouth at bedtime as needed for muscle spasms.    [provider]  diazepam (VALIUM) 5 MG tablet Take 5 mg by mouth every 6 (six) hours as needed for anxiety.    [provider]  gabapentin (NEURONTIN) 300 MG capsule Take 300 mg by mouth 3 (three) times daily.    [provider]  glyBURIDE-metformin (GLUCOVANCE) 2.5-500 MG tablet Take 2 tablets by mouth 2 (two) times daily.    [provider]  guaiFENesin-codeine 100-10 MG/5ML syrup Take 5 mLs by mouth every 6 (six) hours as needed for cough. 02/12/17   Harvest Dark, MD  lisinopril (PRINIVIL,ZESTRIL) 2.5 MG tablet Take 2.5 mg by mouth daily.    [provider]  rosuvastatin (CRESTOR) 20 MG tablet Take 1 tablet (20 mg total) by mouth daily. 07/18/17 01/09/18  Saundra Shelling, MD  spironolactone (ALDACTONE) 25 MG tablet Take 25 mg by mouth daily.    [provider]  torsemide (DEMADEX) 20 MG tablet Take 60 mg by mouth daily.     [provider]  traZODone (DESYREL) 100 MG tablet Take 1 tablet (100 mg total) by mouth at bedtime as needed for sleep. 07/17/17 01/09/18  Saundra Shelling, MD      PHYSICAL EXAMINATION:   VITAL SIGNS: Blood pressure (!) 178/124, pulse 85, temperature 98.3 F (36.8 C), temperature source Oral, resp. rate 20, height 5\' 9"  (1.753 m), weight 102.1 kg, SpO2 93 %.  GENERAL:  68 y.o.-year-old patient lying in the bed with no acute distress.  EYES: Pupils equal, round, reactive to light and accommodation.  No scleral icterus. Extraocular muscles intact.  HEENT: Head atraumatic, normocephalic. Oropharynx and nasopharynx clear.  NECK:  Supple, no jugular venous distention. No thyroid enlargement, no tenderness.  LUNGS: Normal breath sounds bilaterally, no wheezing, rales,rhonchi or crepitation. No use of accessory muscles of respiration.  CARDIOVASCULAR: S1, S2 fast but regular. No murmurs, rubs, or gallops.  ABDOMEN: Soft, nontender, nondistended. Bowel sounds present. No organomegaly or mass.  EXTREMITIES: No pedal edema, cyanosis, or clubbing.  NEUROLOGIC: Cranial nerves II through XII are intact. Muscle strength 4/5 in all extremities. Sensation intact. Gait not checked.  PSYCHIATRIC: The patient is alert and oriented x 3.  SKIN: No obvious rash, lesion, or ulcer.   LABORATORY PANEL:   CBC Recent Labs  Lab 02/26/18 0104 02/26/18 1538  WBC 10.2 8.9  HGB 16.7 17.5*  HCT 47.3 49.3  PLT 148* 161  MCV 90.8 91.5  MCH 32.1  32.5  MCHC 35.3 35.5  RDW 13.2 13.3  LYMPHSABS 0.8  --   MONOABS 1.4*  --   EOSABS 0.2  --   BASOSABS 0.0  --    ------------------------------------------------------------------------------------------------------------------  Chemistries  Recent Labs  Lab 02/26/18 0104 02/26/18 1538  NA 133* 135  K 4.1 3.8  CL 98 102  CO2 23 21*  GLUCOSE 314* 284*  BUN 24* 31*  CREATININE 1.30* 1.30*  CALCIUM 9.4 9.4  AST 34  --   ALT 27  --   ALKPHOS 96  --   BILITOT 1.2  --    ------------------------------------------------------------------------------------------------------------------ estimated creatinine clearance is 65.9 mL/min (A) (by C-G formula based on SCr of 1.3 mg/dL (H)). ------------------------------------------------------------------------------------------------------------------ Recent Labs    02/26/18 1624  TSH 0.930     Coagulation profile Recent Labs  Lab 02/26/18 1901  INR 1.1    ------------------------------------------------------------------------------------------------------------------- No results for input(s): DDIMER in the last 72 hours. -------------------------------------------------------------------------------------------------------------------  Cardiac Enzymes No results for input(s): CKMB, TROPONINI, MYOGLOBIN in the last 168 hours.  Invalid input(s): CK ------------------------------------------------------------------------------------------------------------------ Invalid input(s): POCBNP  ---------------------------------------------------------------------------------------------------------------  Urinalysis    Component Value Date/Time   COLORURINE YELLOW (A) 02/26/2018 1539   APPEARANCEUR CLEAR (A) 02/26/2018 1539   APPEARANCEUR Clear 12/22/2013 0943   LABSPEC 1.025 02/26/2018 1539   LABSPEC 1.022 12/22/2013 0943   PHURINE 5.0 02/26/2018 1539   GLUCOSEU 150 (A) 02/26/2018 1539   GLUCOSEU >=500 12/22/2013 0943   HGBUR LARGE (A) 02/26/2018 1539   BILIRUBINUR NEGATIVE 02/26/2018 1539   BILIRUBINUR Negative 12/22/2013 0943   KETONESUR 5 (A) 02/26/2018 1539   PROTEINUR 100 (A) 02/26/2018 1539   NITRITE NEGATIVE 02/26/2018 1539   LEUKOCYTESUR NEGATIVE 02/26/2018 1539   LEUKOCYTESUR Negative 12/22/2013 0943     RADIOLOGY: Ct Head Wo Contrast  Result Date: 02/26/2018 CLINICAL DATA:  Hematuria.  Generalized weakness.  Recent falls. EXAM: CT HEAD WITHOUT CONTRAST TECHNIQUE: Contiguous axial images were obtained from the base of the skull through the vertex without intravenous contrast. COMPARISON:  Head CT-07/15/2017; parenchymal I-07/15/2017 FINDINGS: Brain: Similar findings of advanced atrophy with sulcal prominence and prominence of the bifrontal extra-axial spaces. Periventricular hypodensities compatible microvascular ischemic disease. Encephalomalacia involving the left centrum semiovale (image 17, series 2) and basal  ganglia/insular cortex (image 14) as well as the contralateral right centrum semiovale (image 19, series 2). Given background parenchymal abnormalities, there is no CT evidence superimposed acute large territory infarct. No intraparenchymal or extra-axial or hemorrhage. Unchanged size and configuration of the ventricles and the basilar cisterns. No midline shift. Vascular: Intracranial atherosclerosis. Skull: No displaced calvarial fracture. Sinuses/Orbits: Limited visualization the paranasal sinuses and mastoid air cells is normal. No air-fluid levels. Debris is seen within the right external auditory canal. Post bilateral cataract surgery. Other: Regional soft tissues appear normal. No displaced calvarial fracture IMPRESSION: Similar findings of atrophy and microvascular ischemic disease without superimposed acute intracranial process. Electronically Signed   By: Sandi Mariscal M.D.   On: 02/26/2018 16:47   Ct Angio Chest Pe W And/or Wo Contrast  Result Date: 02/26/2018 CLINICAL DATA:  Hypoxia EXAM: CT ANGIOGRAPHY CHEST WITH CONTRAST TECHNIQUE: Multidetector CT imaging of the chest was performed using the standard protocol during bolus administration of intravenous contrast. Multiplanar CT image reconstructions and MIPs were obtained to evaluate the vascular anatomy. CONTRAST:  43mL OMNIPAQUE IOHEXOL 350 MG/ML SOLN COMPARISON:  Chest radiograph and chest CT February 12, 2017. FINDINGS: Cardiovascular: There are small pulmonary emboli in peripheral left lower lobe pulmonary arteries supplying  the medial and posterior segments of the left lower lobe. No more central pulmonary embolus evident. No demonstrable right heart strain. The aorta is tortuous. No evident thoracic aortic aneurysm or dissection. The visualized great vessels show areas calcification. There is aortic atherosclerosis. There are multiple foci of coronary artery calcification. There is a small amount of pericardial fluid which may be within the  physiologic range. There is no pericardial thickening. Mediastinum/Nodes: Thyroid appears unremarkable. There is no appreciable thoracic adenopathy. No esophageal lesions are evident. Lungs/Pleura: There is no evident edema or consolidation. The nodular opacity seen previously in the left upper lobe has cleared since prior studies. There is mild bibasilar atelectasis. No appreciable pleural effusion or pleural thickening. Upper Abdomen: Gallbladder is absent. There are foci of calcification in the splenic artery. Visualized upper abdominal structures otherwise appear unremarkable. Musculoskeletal: There is degenerative change in the thoracic spine. There are no blastic or lytic bone lesions. No evident chest wall lesions. Review of the MIP images confirms the above findings. IMPRESSION: 1. Small pulmonary emboli in peripheral left lower lobe pulmonary arteries supplying the medial and posterior segments of the left lower lobe. No more central pulmonary embolus evident. No evident right heart strain. 2. No thoracic aortic aneurysm or dissection. Aortic atherosclerosis as well as foci great vessel and coronary artery calcification evident. 3. No edema or consolidation.  Areas of mild atelectatic change. 4. No demonstrable adenopathy. 5. Gallbladder absent. 6. Suspect pericardial fluid is within the physiologic range. Critical Value/emergent results were called by telephone at the time of interpretation on 02/26/2018 at 6:53 pm to Dr. Nance Pear , who verbally acknowledged these results. Aortic Atherosclerosis (ICD10-I70.0). Electronically Signed   By: Lowella Grip III M.D.   On: 02/26/2018 18:55    EKG: Orders placed or performed during the hospital encounter of 02/26/18  . EKG 12-Lead  . EKG 12-Lead  . ED EKG  . ED EKG    IMPRESSION AND PLAN:  *Pulmonary embolism This is small PE. Started on heparin drip by ER physician. Get echocardiogram. Monitor on telemetry. Also watch for any frank  hematuria while on heparin drip.  *Tachycardia and dizziness PE is very small as per the report. We will monitor on telemetry to rule out any cardiac arrhythmia also. Cardiology consult is called in. Echocardiogram.  *Burning in urine and hematuria Patient has 50 RBCs and some burning in the urine for last 1 to 2 days. I have requested to get the urine culture. If his complaint persist then we may need to start on antibiotics. Watch for hematuria as he would be on heparin drip now.  *Diabetes Continue home medication and keep on sliding scale coverage.  *Hypertension Continue home medications.  *History of stroke Continue aspirin.   All the records are reviewed and case discussed with ED provider. Management plans discussed with the patient, family and they are in agreement.  CODE STATUS: DNR Code Status History    Date Active Date Inactive Code Status Order ID Comments User Context   07/15/2017 1537 07/17/2017 1758 Full Code [id number]  Hillary Bow, MD ED    Advance Directive Documentation     Most Recent Value  Type of Advance Directive  Healthcare Power of [place], Living will  Pre-existing out of facility DNR order (yellow form or pink MOST form)  -  "MOST" Form in Place?  -       TOTAL TIME TAKING CARE OF THIS PATIENT: 50 minutes.    Vaughan Basta M.D on  02/26/2018   Between 7am to 6pm - Pager - [phone]  After 6pm go to [email or web address] - password [email or web address] [facility]  Office  [phone]  CC: Primary care physician; Kirk Ruths, MD   Note: This dictation was prepared with Dragon dictation along with smaller phrase technology. Any transcriptional errors that result from this process are unintentional.

## 2018-02-26 NOTE — ED Notes (Signed)
Pt denies CP at this time. This RN will continue to  Monitor.

## 2018-02-26 NOTE — ED Triage Notes (Signed)
Pt arrives to ED via [facility] from home with c/o falls x2. Per EMS, pt fell earlier tonight at 56pm and was assisted up by the Fire Dept but refused transport to be seen. Pt then had a 2nd fall at 1030pm and was brought to the ED for evaluation. Pt denies head injury or LOC, only pain-related c/o r/t chronic back pain. Pt does not take anticoagulants.

## 2018-02-26 NOTE — ED Notes (Signed)
Pt voided on a urinal while in bed, hematuria noted. RN assessed the Pt's penis and a moderate amount of blood was noted. Dr. Corky Downs was notified.

## 2018-02-26 NOTE — ED Notes (Signed)
Gave pt urinal to use bathroom

## 2018-02-26 NOTE — ED Notes (Signed)
Repeated EKG done and given to MD Archie Balboa. EDP at bedside updating pt and pt's  family

## 2018-02-26 NOTE — ED Notes (Signed)
ED Provider at bedside. 

## 2018-02-26 NOTE — ED Provider Notes (Signed)
[facility] Emergency Department Provider Note   ____________________________________________    I have reviewed the triage vital signs and the nursing notes.   HISTORY  Chief Complaint Fall     HPI Michael Humphrey is a 68 y.o. male who presents after a fall.  Patient reports that he fell twice today, he is not entirely sure why although he does report feeling somewhat lightheaded.  He reports he has been on several fluid pills and has been "urinating all day ".  Denies chest pain or abdominal pain.  No shortness of breath.  Currently feels well and has no complaints.  Denies any injuries.  Denies fevers or chills  Past Medical History:  Diagnosis Date  . Arthritis   . Diabetes mellitus without complication ([place])   . DVT (deep venous thrombosis) ([place]) 07/2017   nonocclusive DVT left internal jugular   . High cholesterol   . Hypertension   . Lumbar radiculitis   . Stroke [facility])    07/2017    Patient Active Problem List   Diagnosis Date Noted  . CVA (cerebral vascular accident) ([place]) 07/15/2017    Past Surgical History:  Procedure Laterality Date  . BUNIONECTOMY    . CATARACT EXTRACTION W/PHACO Left 12/11/2017   Procedure: CATARACT EXTRACTION PHACO AND INTRAOCULAR LENS PLACEMENT ([place])  LEFT DIABETIC;  Surgeon: Leandrew Koyanagi, MD;  Location: [place];  Service: Ophthalmology;  Laterality: Left;  DIABETIC ([facility] picking pt up at 0600, needs 0630 arrival time)  . CATARACT EXTRACTION W/PHACO Right 01/15/2018   Procedure: CATARACT EXTRACTION PHACO AND INTRAOCULAR LENS PLACEMENT ([place])  RIGHT DIABETIC  leave so arrival will be around 7:45 ds;  Surgeon: Leandrew Koyanagi, MD;  Location: [place];  Service: Ophthalmology;  Laterality: Right;  Diabetic - oral meds  . CHOLECYSTECTOMY    . COLONOSCOPY    . COLONOSCOPY WITH PROPOFOL N/A 12/17/2014   Procedure: COLONOSCOPY WITH PROPOFOL;  Surgeon: Lollie Sails, MD;   Location: [facility] ENDOSCOPY;  Service: Endoscopy;  Laterality: N/A;  . correct hammertoe    . JOINT REPLACEMENT    . KNEE ARTHROSCOPY Right   . TOTAL HIP ARTHROPLASTY    . TOTAL KNEE ARTHROPLASTY Right     Prior to Admission medications   Medication Sig Start Date End Date Taking? Authorizing Provider  aspirin EC 81 MG tablet Take 81 mg by mouth daily.    [provider]  carvedilol (COREG) 3.125 MG tablet Take 3.125 mg by mouth 2 (two) times daily. 06/04/17   [provider]  cyclobenzaprine (FLEXERIL) 5 MG tablet Take 5-10 mg by mouth at bedtime as needed for muscle spasms.    [provider]  diazepam (VALIUM) 5 MG tablet Take 5 mg by mouth every 6 (six) hours as needed for anxiety.    [provider]  gabapentin (NEURONTIN) 300 MG capsule Take 300 mg by mouth 3 (three) times daily.    [provider]  glyBURIDE-metformin (GLUCOVANCE) 2.5-500 MG tablet Take 2 tablets by mouth 2 (two) times daily.    [provider]  guaiFENesin-codeine 100-10 MG/5ML syrup Take 5 mLs by mouth every 6 (six) hours as needed for cough. 02/12/17   Harvest Dark, MD  lisinopril (PRINIVIL,ZESTRIL) 2.5 MG tablet Take 2.5 mg by mouth daily.    [provider]  rosuvastatin (CRESTOR) 20 MG tablet Take 1 tablet (20 mg total) by mouth daily. 07/18/17 01/09/18  Saundra Shelling, MD  spironolactone (ALDACTONE) 25 MG tablet  Take 25 mg by mouth daily.    [provider]  torsemide (DEMADEX) 20 MG tablet Take 60 mg by mouth daily.     [provider]  traZODone (DESYREL) 100 MG tablet Take 1 tablet (100 mg total) by mouth at bedtime as needed for sleep. 07/17/17 01/09/18  Saundra Shelling, MD     Allergies Clonidine derivatives; Fioricet [butalbital-apap-caffeine]; Hydrocodone-acetaminophen; Mobic [meloxicam]; Norvasc [amlodipine besylate]; Statins; and Tramadol  Family History  Problem Relation Age of Onset  . Clotting disorder Father   .  Hypertension Father     Social History Social History   Tobacco Use  . Smoking status: Never Smoker  . Smokeless tobacco: Former Systems developer    Types: Chew  Substance Use Topics  . Alcohol use: No  . Drug use: No    Review of Systems  Constitutional: No fever/chills Eyes: No visual changes.  ENT: No sore throat. Cardiovascular: Denies chest pain. Respiratory: Denies shortness of breath. Gastrointestinal: No abdominal pain.   Genitourinary: Frequent urination, no dysuria Musculoskeletal: Negative for back pain. Skin: Negative for rash. Neurological: Negative for headaches or weakness   ____________________________________________   PHYSICAL EXAM:  VITAL SIGNS: ED Triage Vitals  Enc Vitals Group     BP 02/26/18 0053 (!) 144/105     Pulse Rate 02/26/18 0053 (!) 104     Resp 02/26/18 0053 17     Temp 02/26/18 0053 99 F (37.2 C)     Temp Source 02/26/18 0053 Oral     SpO2 02/26/18 0053 94 %     Weight 02/26/18 0054 102.1 kg (225 lb)     Height 02/26/18 0054 1.753 m (5\' 9" )     Head Circumference --      Peak Flow --      Pain Score 02/26/18 0054 0     Pain Loc --      Pain Edu? --      Excl. in [place]? --     Constitutional: Alert and oriented. No acute distress.  Eyes: Conjunctivae are normal.  Head: Atraumatic. Nose: No congestion/rhinnorhea. Mouth/Throat: Mucous membranes are dry.   Neck:  Painless ROM Cardiovascular: Normal rate, regular rhythm. Grossly normal heart sounds.  Good peripheral circulation. Respiratory: Normal respiratory effort.  No retractions. Lungs CTAB. Gastrointestinal: Soft and nontender. No distention.    Musculoskeletal: No lower extremity tenderness nor edema.  Warm and well perfused Neurologic:  Normal speech and language. No gross focal neurologic deficits are appreciated.  Skin:  Skin is warm, dry and intact. No rash noted. Psychiatric: Mood and affect are normal. Speech and behavior are  normal.  ____________________________________________   LABS (all labs ordered are listed, but only abnormal results are displayed)  Labs Reviewed  CBC WITH DIFFERENTIAL/PLATELET - Abnormal; Notable for the following components:      Result Value   Platelets 148 (*)    Monocytes Absolute 1.4 (*)    All other components within normal limits  COMPREHENSIVE METABOLIC PANEL - Abnormal; Notable for the following components:   Sodium 133 (*)    Glucose, Bld 314 (*)    BUN 24 (*)    Creatinine, Ser 1.30 (*)    GFR calc non Af Amer 57 (*)    All other components within normal limits  URINALYSIS, COMPLETE (UACMP) WITH MICROSCOPIC - Abnormal; Notable for the following components:   Color, Urine YELLOW (*)    APPearance CLEAR (*)    Glucose, UA 150 (*)    Hgb urine dipstick  MODERATE (*)    Ketones, ur 5 (*)    Protein, ur 100 (*)    RBC / HPF >50 (*)    All other components within normal limits   ____________________________________________  EKG  ED ECG REPORT I, Lavonia Drafts, the attending physician, personally viewed and interpreted this ECG.  Date: 02/26/2018  Rhythm: Sinus tachycardia QRS Axis: normal Intervals: normal ST/T Wave abnormalities: normal Narrative Interpretation: no evidence of acute ischemia  ____________________________________________  RADIOLOGY  None ____________________________________________   PROCEDURES  Procedure(s) performed: No  Procedures   Critical Care performed: No ____________________________________________   INITIAL IMPRESSION / ASSESSMENT AND PLAN / ED COURSE  Pertinent labs & imaging results that were available during my care of the patient were reviewed by me and considered in my medical decision making (see chart for details).  Patient overall well-appearing and in no acute distress.  I suspect he has become dehydrated from diuresis which may have led to his fall.  No injury from the fall, reassuring exam.  Will treat  with 500 cc normal saline bolus while we await labs  Lab work overall reassuring, mild dehydration evidenced by BUN/creatinine ratio.  Patient is feeling better orthostatics reassuring  While in the emergency department patient had an episode of gross hematuria, this seemed to improve with further episodes.  No evidence of urinary tract infection.  Patient has follow-up with his PCP today at 3 PM, he would like to be discharged for follow-up thinks is appropriate.  Return precautions discussed    ____________________________________________   FINAL CLINICAL IMPRESSION(S) / ED DIAGNOSES  Final diagnoses:  Fall, initial encounter  Dehydration  Gross hematuria        Note:  This document was prepared using Dragon voice recognition software and may include unintentional dictation errors.   Lavonia Drafts, MD 02/26/18 [phone]

## 2018-02-26 NOTE — ED Notes (Signed)
Pt denies CP, SOB, dizziness at this time. Family at bedside.

## 2018-02-26 NOTE — ED Notes (Signed)
First nurse note: pt referred from [facility] Dr. Ouida Sills for [phone], hematuria, and generalized weakness with multiple falls recently.

## 2018-02-26 NOTE — Progress Notes (Signed)
[place] for heparin Indication: pulmonary embolus  Allergies  Allergen Reactions  . Clonidine Derivatives     Hallucinations   . Fioricet [Butalbital-Apap-Caffeine]     hallucination  . Hydrocodone-Acetaminophen     hallucinations  . Mobic [Meloxicam]     hallucinations  . Norvasc [Amlodipine Besylate]     hallucinations  . Statins     Muscle cramps  . Tramadol Itching    Patient Measurements: Height: 5\' 9"  (175.3 cm) Weight: 225 lb (102.1 kg) IBW/kg (Calculated) : 70.7 Heparin Dosing Weight: 92.5 kg  Vital Signs: Temp: 98.3 F (36.8 C) (02/26 1528) Temp Source: Oral (02/26 1528) BP: 178/124 (02/26 1730) Pulse Rate: 85 (02/26 1743)  Labs: Recent Labs    02/26/18 0104 02/26/18 1538  HGB 16.7 17.5*  HCT 47.3 49.3  PLT 148* 161  CREATININE 1.30* 1.30*    Estimated Creatinine Clearance: 65.9 mL/min (A) (by C-G formula based on SCr of 1.3 mg/dL (H)).   Medical History: Past Medical History:  Diagnosis Date  . Arthritis   . Diabetes mellitus without complication ([place])   . DVT (deep venous thrombosis) ([place]) 07/2017   nonocclusive DVT left internal jugular   . High cholesterol   . Hypertension   . Lumbar radiculitis   . Stroke [facility])    07/2017    Assessment: 68 year old male presented from PCP office with tachycardia. He was seen for blood in urine with pain/burning with urination. No anticoagulation PTA; nurse confirmed with patient.  Goal of Therapy:  Heparin level 0.3-0.7 units/ml Monitor platelets by anticoagulation protocol: Yes   Plan:  Per RN, patient noticed blood in urine yesterday and that it was some spotting. Will bolus slightly more cautiously because of this. Heparin 4000 unit bolus once followed by heparin drip at 1450 units/hr. Heparin level ordered for 2/27 at 0200. CBC with morning labs.  Tawnya Crook, PharmD Pharmacy Resident  02/26/2018 7:20 PM

## 2018-02-26 NOTE — ED Triage Notes (Signed)
Pt was sent by his PCP where he was seen for blood in urine with painful and burning urination and sinus tach.  Pt was also found to have an elevated potassium. Pt is alert and oriented at this time.  He states that the bloody urine and painful urination began yesterday.  Pt reports that he had a fall yesterday and was seen here for evaluation.

## 2018-02-26 NOTE — Progress Notes (Signed)
Family Meeting Note  Advance Directive:yes  Today a meeting took place with the Patient.   The following clinical team members were present during this meeting:MD  The following were discussed:Patient's diagnosis: Pulmonary embolism, history of stroke, dizziness, diabetes, Patient's progosis: Unable to determine and Goals for treatment: DNR  Additional follow-up to be provided: Cardiology  Time spent during discussion:20 minutes  Vaughan Basta, MD

## 2018-02-27 ENCOUNTER — Inpatient Hospital Stay: Payer: PPO

## 2018-02-27 ENCOUNTER — Inpatient Hospital Stay
Admit: 2018-02-27 | Discharge: 2018-02-27 | Disposition: A | Discharge status: Home / Self Care | Payer: PPO | Attending: Internal Medicine | Admitting: Internal Medicine

## 2018-02-27 DIAGNOSIS — I2694 Multiple subsegmental pulmonary emboli without acute cor pulmonale: Secondary | ICD-10-CM

## 2018-02-27 DIAGNOSIS — R310 Gross hematuria: Secondary | ICD-10-CM

## 2018-02-27 LAB — GLUCOSE, CAPILLARY
GLUCOSE-CAPILLARY: 139 mg/dL — AB (ref 70–99)
Glucose-Capillary: 165 mg/dL — ABNORMAL HIGH (ref 70–99)
Glucose-Capillary: 134 mg/dL — ABNORMAL HIGH (ref 70–99)
Glucose-Capillary: 255 mg/dL — ABNORMAL HIGH (ref 70–99)

## 2018-02-27 LAB — CBC
HEMATOCRIT: 43.5 % (ref 39.0–52.0)
Hemoglobin: 15.1 g/dL (ref 13.0–17.0)
MCH: 31.8 pg (ref 26.0–34.0)
MCHC: 34.7 g/dL (ref 30.0–36.0)
MCV: 91.6 fL (ref 80.0–100.0)
Platelets: 137 10*3/uL — ABNORMAL LOW (ref 150–400)
RBC: 4.75 MIL/uL (ref 4.22–5.81)
RDW: 13.3 % (ref 11.5–15.5)
WBC: 6.2 10*3/uL (ref 4.0–10.5)
nRBC: 0 % (ref 0.0–0.2)

## 2018-02-27 LAB — HEPARIN LEVEL (UNFRACTIONATED)
Heparin Unfractionated: 0.75 IU/mL — ABNORMAL HIGH (ref 0.30–0.70)
Heparin Unfractionated: 0.75 IU/mL — ABNORMAL HIGH (ref 0.30–0.70)
Heparin Unfractionated: 0.57 IU/mL (ref 0.30–0.70)

## 2018-02-27 LAB — BASIC METABOLIC PANEL
Anion gap: 8 (ref 5–15)
BUN: 29 mg/dL — ABNORMAL HIGH (ref 8–23)
CO2: 22 mmol/L (ref 22–32)
Calcium: 8.8 mg/dL — ABNORMAL LOW (ref 8.9–10.3)
Chloride: 106 mmol/L (ref 98–111)
Creatinine, Ser: 1.1 mg/dL (ref 0.61–1.24)
GFR calc Af Amer: >60 mL/min (ref >60)
GFR calc non Af Amer: >60 mL/min (ref >60)
GLUCOSE: 185 mg/dL — AB (ref 70–99)
Potassium: 3.5 mmol/L (ref 3.5–5.1)
Sodium: 136 mmol/L (ref 135–145)

## 2018-02-27 LAB — ECHOCARDIOGRAM COMPLETE
Height: 69 in
Weight: 3384 oz

## 2018-02-27 MED ORDER — IOHEXOL 300 MG/ML  SOLN
125.0000 mL | Freq: Once | INTRAMUSCULAR | Status: AC | PRN
  Administered 2018-02-27: 125 mL via INTRAVENOUS

## 2018-02-27 MED ORDER — METOPROLOL TARTRATE 5 MG/5ML IV SOLN: 5.0000 mg | Freq: Four times a day (QID) | INTRAVENOUS | Status: DC | PRN

## 2018-02-27 MED ORDER — FINASTERIDE 5 MG PO TABS
5.0000 mg | ORAL_TABLET | Freq: Every day | ORAL | Status: DC
  Administered 2018-02-27 – 2018-02-28 (×2): 5 mg via ORAL
  Filled 2018-02-27 (×2): qty 1

## 2018-02-27 NOTE — Progress Notes (Signed)
ANTICOAGULATION CONSULT NOTE  Pharmacy Consult for heparin Indication: pulmonary embolus  Patient Measurements: Height: 5\' 9"  (175.3 cm) Weight: 211 lb 8 oz (95.9 kg) IBW/kg (Calculated) : 70.7 Heparin Dosing Weight: 92.5 kg  Vital Signs: Temp: 97.6 F (36.4 C) (02/27 0720) Temp Source: Oral (02/27 0720) BP: 140/99 (02/27 0720) Pulse Rate: 85 (02/27 0720)  Labs: Recent Labs    02/26/18 0104 02/26/18 1538 02/26/18 1901 02/27/18 0214 02/27/18 1006  HGB 16.7 17.5*  --  15.1  --   HCT 47.3 49.3  --  43.5  --   PLT 148* 161  --  137*  --   APTT  --   --  29  --   --   LABPROT  --   --  13.9  --   --   INR  --   --  1.1  --   --   HEPARINUNFRC  --   --   --  0.75* 0.75*  CREATININE 1.30* 1.30*  --  1.10  --     Estimated Creatinine Clearance: 75.5 mL/min (by C-G formula based on SCr of 1.1 mg/dL).   Medical History: Past Medical History:  Diagnosis Date  . Arthritis   . Diabetes mellitus without complication ([place])   . DVT (deep venous thrombosis) ([place]) 07/2017   nonocclusive DVT left internal jugular   . High cholesterol   . Hypertension   . Lumbar radiculitis   . Stroke [facility])    07/2017    Assessment: 68 year old male presented from PCP office with tachycardia. He was seen for blood in urine with pain/burning with urination. No anticoagulation PTA; nurse confirmed with patient. Urology is currently being consulted for hematuria. Hgb, PLT currently wnl  Goal of Therapy:  Heparin level 0.3-0.7 units/ml Monitor platelets by anticoagulation protocol: Yes   Heparin Course: 2/26 PM initiation: 4000 unit bolus, then 1450 units/hr 2/27 0200 HL 0.75: rate decreased to 1350 units/hr 2/27 1006 HL 0.75:    Plan:  Heparin level remains supratherapeutic. Will decrease rate further to 1250 units/hr and will recheck HL at 2000, CBC in am  Vallery Sa, PharmD Clinical Pharmacist 02/27/2018

## 2018-02-27 NOTE — Progress Notes (Signed)
[place] for heparin Indication: pulmonary embolus  Allergies  Allergen Reactions  . Oxycodone     hallucinations  . Clonidine Derivatives     Hallucinations   . Fioricet [Butalbital-Apap-Caffeine]     hallucination  . Hydrocodone-Acetaminophen     hallucinations  . Mobic [Meloxicam]     hallucinations  . Norvasc [Amlodipine Besylate]     hallucinations  . Statins     Muscle cramps  . Tramadol Itching    Patient Measurements: Height: 5\' 9"  (175.3 cm) Weight: 225 lb (102.1 kg) IBW/kg (Calculated) : 70.7 Heparin Dosing Weight: 92.5 kg  Vital Signs: Temp: 97.8 F (36.6 C) (02/26 2306) Temp Source: Oral (02/26 2306) BP: 161/112 (02/26 2324) Pulse Rate: 86 (02/26 2324)  Labs: Recent Labs    02/26/18 0104 02/26/18 1538 02/26/18 1901 02/27/18 0214  HGB 16.7 17.5*  --  15.1  HCT 47.3 49.3  --  43.5  PLT 148* 161  --  137*  APTT  --   --  29  --   LABPROT  --   --  13.9  --   INR  --   --  1.1  --   HEPARINUNFRC  --   --   --  0.75*  CREATININE 1.30* 1.30*  --  1.10    Estimated Creatinine Clearance: 77.8 mL/min (by C-G formula based on SCr of 1.1 mg/dL).   Medical History: Past Medical History:  Diagnosis Date  . Arthritis   . Diabetes mellitus without complication ([place])   . DVT (deep venous thrombosis) ([place]) 07/2017   nonocclusive DVT left internal jugular   . High cholesterol   . Hypertension   . Lumbar radiculitis   . Stroke [facility])    07/2017    Assessment: 68 year old male presented from PCP office with tachycardia. He was seen for blood in urine with pain/burning with urination. No anticoagulation PTA; nurse confirmed with patient.  Goal of Therapy:  Heparin level 0.3-0.7 units/ml Monitor platelets by anticoagulation protocol: Yes   Plan:  02/27 @ 0200 HL 0.75 supratherapeutic. Will decrease rate to 1350 units/hr and will recheck HL @ 1000, CBC trending down slowly will continue to monitor.  Tobie Lords, PharmD, BCPS Clinical Pharmacist 02/27/2018

## 2018-02-27 NOTE — Progress Notes (Signed)
[facility] [facility] [place] NAME: Michael Humphrey    MR#:  [id number]  DATE OF BIRTH:  March 21, 1950  SUBJECTIVE:   Pt. Here due to tachycardia and noted to have small pulmonary embolism.  Started on heparin drip and was having some hematuria this morning.  CT urogram negative for acute source of hematuria.  Pt. Denies any chest pain, N/V.    REVIEW OF SYSTEMS:    Review of Systems  Constitutional: Negative for chills and fever.  HENT: Negative for congestion and tinnitus.   Eyes: Negative for blurred vision and double vision.  Respiratory: Negative for cough, shortness of breath and wheezing.   Cardiovascular: Negative for chest pain, orthopnea and PND.  Gastrointestinal: Negative for abdominal pain, diarrhea, nausea and vomiting.  Genitourinary: Positive for hematuria. Negative for dysuria.  Neurological: Negative for dizziness, sensory change and focal weakness.  All other systems reviewed and are negative.   Nutrition: Heart Healthy Tolerating Diet: Yes Tolerating PT: Await Eval.    DRUG ALLERGIES:   Allergies  Allergen Reactions  . Oxycodone     hallucinations  . Clonidine Derivatives     Hallucinations   . Fioricet [Butalbital-Apap-Caffeine]     hallucination  . Hydrocodone-Acetaminophen     hallucinations  . Mobic [Meloxicam]     hallucinations  . Norvasc [Amlodipine Besylate]     hallucinations  . Statins     Muscle cramps  . Tramadol Itching    VITALS:  Blood pressure (!) 140/99, pulse 85, temperature 97.6 F (36.4 C), temperature source Oral, resp. rate 19, height 5\' 9"  (1.753 m), weight 95.9 kg, SpO2 90 %.  PHYSICAL EXAMINATION:   Physical Exam  GENERAL:  68 y.o.-year-old obese patient lying in bed in no acute distress.  EYES: Pupils equal, round, reactive to light and accommodation. No scleral icterus. Extraocular muscles intact.  HEENT: Head atraumatic, normocephalic. Oropharynx and nasopharynx clear.  NECK:   Supple, no jugular venous distention. No thyroid enlargement, no tenderness.  LUNGS: Normal breath sounds bilaterally, no wheezing, rales, rhonchi. No use of accessory muscles of respiration.  CARDIOVASCULAR: S1, S2 normal. No murmurs, rubs, or gallops.  ABDOMEN: Soft, nontender, nondistended. Bowel sounds present. No organomegaly or mass.  EXTREMITIES: No cyanosis, clubbing or edema b/l.    NEUROLOGIC: Cranial nerves II through XII are intact. No focal Motor or sensory deficits b/l. Globally weak.  PSYCHIATRIC: The patient is alert and oriented x 3.  SKIN: No obvious rash, lesion, or ulcer.    LABORATORY PANEL:   CBC Recent Labs  Lab 02/27/18 0214  WBC 6.2  HGB 15.1  HCT 43.5  PLT 137*   ------------------------------------------------------------------------------------------------------------------  Chemistries  Recent Labs  Lab 02/26/18 0104  02/27/18 0214  NA 133*   < > 136  K 4.1   < > 3.5  CL 98   < > 106  CO2 23   < > 22  GLUCOSE 314*   < > 185*  BUN 24*   < > 29*  CREATININE 1.30*   < > 1.10  CALCIUM 9.4   < > 8.8*  AST 34  --   --   ALT 27  --   --   ALKPHOS 96  --   --   BILITOT 1.2  --   --    < > = values in this interval not displayed.   ------------------------------------------------------------------------------------------------------------------  Cardiac Enzymes No results for input(s): TROPONINI in the last 168 hours. ------------------------------------------------------------------------------------------------------------------  RADIOLOGY:  Ct Head Wo Contrast  Result Date: 02/26/2018 CLINICAL DATA:  Hematuria.  Generalized weakness.  Recent falls. EXAM: CT HEAD WITHOUT CONTRAST TECHNIQUE: Contiguous axial images were obtained from the base of the skull through the vertex without intravenous contrast. COMPARISON:  Head CT-07/15/2017; parenchymal I-07/15/2017 FINDINGS: Brain: Similar findings of advanced atrophy with sulcal prominence and  prominence of the bifrontal extra-axial spaces. Periventricular hypodensities compatible microvascular ischemic disease. Encephalomalacia involving the left centrum semiovale (image 17, series 2) and basal ganglia/insular cortex (image 14) as well as the contralateral right centrum semiovale (image 19, series 2). Given background parenchymal abnormalities, there is no CT evidence superimposed acute large territory infarct. No intraparenchymal or extra-axial or hemorrhage. Unchanged size and configuration of the ventricles and the basilar cisterns. No midline shift. Vascular: Intracranial atherosclerosis. Skull: No displaced calvarial fracture. Sinuses/Orbits: Limited visualization the paranasal sinuses and mastoid air cells is normal. No air-fluid levels. Debris is seen within the right external auditory canal. Post bilateral cataract surgery. Other: Regional soft tissues appear normal. No displaced calvarial fracture IMPRESSION: Similar findings of atrophy and microvascular ischemic disease without superimposed acute intracranial process. Electronically Signed   By: Sandi Mariscal M.D.   On: 02/26/2018 16:47   Ct Angio Chest Pe W And/or Wo Contrast  Result Date: 02/26/2018 CLINICAL DATA:  Hypoxia EXAM: CT ANGIOGRAPHY CHEST WITH CONTRAST TECHNIQUE: Multidetector CT imaging of the chest was performed using the standard protocol during bolus administration of intravenous contrast. Multiplanar CT image reconstructions and MIPs were obtained to evaluate the vascular anatomy. CONTRAST:  15mL OMNIPAQUE IOHEXOL 350 MG/ML SOLN COMPARISON:  Chest radiograph and chest CT February 12, 2017. FINDINGS: Cardiovascular: There are small pulmonary emboli in peripheral left lower lobe pulmonary arteries supplying the medial and posterior segments of the left lower lobe. No more central pulmonary embolus evident. No demonstrable right heart strain. The aorta is tortuous. No evident thoracic aortic aneurysm or dissection. The  visualized great vessels show areas calcification. There is aortic atherosclerosis. There are multiple foci of coronary artery calcification. There is a small amount of pericardial fluid which may be within the physiologic range. There is no pericardial thickening. Mediastinum/Nodes: Thyroid appears unremarkable. There is no appreciable thoracic adenopathy. No esophageal lesions are evident. Lungs/Pleura: There is no evident edema or consolidation. The nodular opacity seen previously in the left upper lobe has cleared since prior studies. There is mild bibasilar atelectasis. No appreciable pleural effusion or pleural thickening. Upper Abdomen: Gallbladder is absent. There are foci of calcification in the splenic artery. Visualized upper abdominal structures otherwise appear unremarkable. Musculoskeletal: There is degenerative change in the thoracic spine. There are no blastic or lytic bone lesions. No evident chest wall lesions. Review of the MIP images confirms the above findings. IMPRESSION: 1. Small pulmonary emboli in peripheral left lower lobe pulmonary arteries supplying the medial and posterior segments of the left lower lobe. No more central pulmonary embolus evident. No evident right heart strain. 2. No thoracic aortic aneurysm or dissection. Aortic atherosclerosis as well as foci great vessel and coronary artery calcification evident. 3. No edema or consolidation.  Areas of mild atelectatic change. 4. No demonstrable adenopathy. 5. Gallbladder absent. 6. Suspect pericardial fluid is within the physiologic range. Critical Value/emergent results were called by telephone at the time of interpretation on 02/26/2018 at 6:53 pm to Dr. Nance Pear , who verbally acknowledged these results. Aortic Atherosclerosis (ICD10-I70.0). Electronically Signed   By: Lowella Grip III M.D.   On: 02/26/2018 18:55  Ct Hematuria Workup  Result Date: 02/27/2018 CLINICAL DATA:  Gross hematuria for 3 days EXAM: CT  ABDOMEN AND PELVIS WITHOUT AND WITH CONTRAST TECHNIQUE: Multidetector CT imaging of the abdomen and pelvis was performed following the standard protocol before and following the bolus administration of intravenous contrast. CONTRAST:  15mL OMNIPAQUE IOHEXOL 300 MG/ML  SOLN COMPARISON:  09/01/2009 FINDINGS: Lower chest: 4 by 6 mm subpleural nodule in the left lower lobe on image 4/50, not changed from 02/12/2017 and likely benign. Trace anterior pericardial effusion. Right coronary artery atherosclerotic vascular disease. Hepatobiliary: 6 by 5 mm hypodense lesion in the dome of the liver near the junction of the right and left hepatic lobes on image 9/4, not well seen on 09/01/2009, technically too small to characterize although statistically likely to be a small cyst or similar benign lesion. Cholecystectomy. Pancreas: Unremarkable Spleen: Unremarkable Adrenals/Urinary Tract: Adrenal glands normal. No nephrolithiasis. 2.3 by 1.6 cm Bosniak category 1 cyst of the left mid kidney laterally, simple/benign. There is layering of contrast medium in the urinary bladder on the delayed images, probably from prolonged prone positioning, resulting in poor mixing. 4.4 by 3.3 by 3.5 cm soft tissue mass along the base of the urinary bladder is probably a prominent median lobe of the prostate gland, and less likely to be urothelial tumor. No other filling defect along the urothelium identified. Stomach/Bowel: Descending and sigmoid colon diverticulosis. There are some scattered air-levels in the distal large bowel which could reflect a diarrheal process. I do not see definite findings of active diverticulitis. Vascular/Lymphatic: Aortoiliac atherosclerotic vascular disease. No pathologic adenopathy. Reproductive: Prostatomegaly noted. The prostate is partially obscured by streak artifact from the patient's hip implants despite the use of metal artifact reduction. Other: No supplemental non-categorized findings. Musculoskeletal:  Bilateral hip prostheses. Lumbar spondylosis and degenerative disc disease causing impingement at L3-4, L4-5, and L5-S1. Grade 1 degenerative anterolisthesis at L5-S1. IMPRESSION: 1. 4.4 by 3.3 by 3.5 cm soft tissue density lesion along the base the urinary bladder is probably a prominent median lobe of the prostate gland; there is of similar morphology in 2011 hence urothelial tumor is considered highly unlikely. Prostatomegaly is present. 2. A cause for the patient's gross hematuria is not appreciated. 3. Aortic Atherosclerosis (ICD10-I70.0). Right coronary artery atherosclerosis. 4. Nonspecific 6 mm lesion in the dome of the liver, probably benign but technically nonspecific. 5. Descending and sigmoid colon diverticulosis. Air fluid levels in the colon, diarrheal process not readily excluded. 6. Lumbar impingement at L3-4, L4-5, and L5-S1. Electronically Signed   By: Van Clines M.D.   On: 02/27/2018 12:38     ASSESSMENT AND PLAN:   68 year old male with past medical history of diabetes, hypertension, hyperlipidemia, history of previous CVA, osteoarthritis who presented to the hospital due to tachycardia and underwent a CT scan of his chest which was positive for a small acute pulmonary embolism.  1.  Acute pulmonary embolism- this is a very small pulmonary embolism. -Unclear if this is a provoked or unprovoked VTE.  Patient is fairly active as per the family. - Currently on heparin drip and will continue.  Patient was having some hematuria and therefore will hold off on switching to oral anticoagulants yet.  2.  Tachycardia/dizziness- secondary to the pulmonary embolism. - Appreciate cardiology input, no acute intervention presently.  No acute arrhythmia on telemetry.  Await echocardiogram results.  3.  Hematuria- noted this morning, hemoglobin remained stable.  Patient is hemodynamically stable. -Urology consult obtained.  CT urogram showing no evidence  to explain gross hematuria.  We  will continue to monitor.  Continue heparin drip for now.  4.  Essential hypertension-continue carvedilol and lisinopril  5.  Hyperlipidemia-continue Crestor.  6.  History of chronic diastolic CHF-clinically patient is not in congestive heart failure.  Echocardiogram showing EF of 55 to 60%. - Continue torsemide, Aldactone, carvedilol, lisinopril     All the records are reviewed and case discussed with Care Management/Social Worker. Management plans discussed with the patient, family and they are in agreement.  CODE STATUS: DNR  DVT Prophylaxis: Heparin gtt  TOTAL TIME TAKING CARE OF THIS PATIENT: 30 minutes.   POSSIBLE D/C IN 1-2 DAYS, DEPENDING ON CLINICAL CONDITION.   Henreitta Leber M.D on 02/27/2018 at 3:27 PM  Between 7am to 6pm - Pager - [phone]  After 6pm go to [email or web address] - [employer] [facility]  Office  [phone]  CC: Primary care physician; Kirk Ruths, MD

## 2018-02-27 NOTE — Progress Notes (Signed)
Large amounts of bright red blood noted with urination-pt denies pain or discomfort/  Dr. Andee Poles made aware/  Urology consult ordered/ MD orders to continue heparin infusion/ will monitor close

## 2018-02-27 NOTE — Consult Note (Signed)
02/27/2018 11:03 AM   Michael Humphrey 1950-05-10 [id number]  CC: Hematuria in setting of new PE  HPI: I was asked to see Michael Humphrey by Dr. Verdell Carmine in consultation for gross hematuria in the setting of newly diagnosed pulmonary embolus, currently on heparin drip.  He is a 68 year old male with a number of co-morbidities including type 2 diabetes, history of DVT, and history of stroke, that was admitted to the hospitalist last night with complaints of dizziness and burning with urination.  He underwent a CT chest, and was found to have a small pulmonary embolus and was started on anticoagulation with a heparin drip.  His urinalysis on admission showed greater than 50 RBCs, 0-5 WBCs, no bacteria, nitrite negative.  He developed mild gross hematuria with some pink to red urine today.  When I saw him, he was able to void 150 cc of very faint pink urine with no clots.  He denies any weak stream or feeling of incomplete emptying.  He also reports some urinary urgency and frequency.  He denies any flank pain or abdominal pain.  He does report 15 pounds of weight loss over the last month.  He has never seen a urologist before.  Denies history of nephrolithiasis or urinary tract infections.  He chewed tobacco but has never been a smoker.  He denies any other carcinogenic exposures. At baseline, his only anticoagulation is 6m aspirin.  PSA 02/2018 was normal at 2.0   PMH: Past Medical History:  Diagnosis Date  . Arthritis   . Diabetes mellitus without complication ([place]   . D[place]) ([place] 07[place]2017   nonocclusive DVT left internal jugular   . High cholesterol   . Hypertension   . Lumbar radiculitis   . Stroke ([facility]    07/2017    Surgical History: Past Surgical History:  Procedure Laterality Date  . BUNIONECTOMY    . CATARACT EXTRACTION W/PHACO Left 12/11/2017   Procedure: CATARACT EXTRACTION PHACO AND INTRAOCULAR LENS PLACEMENT ([place]  LEFT DIABETIC;  Surgeon:  BLeandrew Koyanagi MD;  [facility]  Service: Ophthalmology;  Laterality: Left;  DIABETIC ([facility] picking pt up at 0600, needs 0630 arrival time)  . CATARACT EXTRACTION W/PHACO Right 01/15/2018   Procedure: CATARACT EXTRACTION PHACO AND INTRAOCULAR LENS PLACEMENT ([facility]  RIGHT DIABETIC  leave so arrival will be around 7:45 ds;  Surgeon: BLeandrew Koyanagi MD;  [facility] [facility]  Service: Ophthalmology;  Laterality: Right;  Diabetic - oral meds  . CHOLECYSTECTOMY    . COLONOSCOPY    . COLONOSCOPY WITH PROPOFOL N/A 12/17/2014   Procedure: COLONOSCOPY WITH PROPOFOL;  Surgeon: MLollie Sails MD;  [facility];  Service: Endoscopy;  Laterality: N/A;  . correct hammertoe    . JOINT REPLACEMENT    . KNEE ARTHROSCOPY Right   . TOTAL HIP ARTHROPLASTY    . TOTAL KNEE ARTHROPLASTY Right     Allergies:  Allergies  Allergen Reactions  . Oxycodone     hallucinations  . Clonidine Derivatives     Hallucinations   . Fioricet [Butalbital-Apap-Caffeine]     hallucination  . Hydrocodone-Acetaminophen     hallucinations  . Mobic [Meloxicam]     hallucinations  . Norvasc [Amlodipine Besylate]     hallucinations  . Statins     Muscle cramps  . Tramadol Itching    Family History: Family History  Problem Relation Age of Onset  . Clotting disorder Father   . Hypertension Father  Social History:  reports that he has never smoked. He has quit using smokeless tobacco.  His smokeless tobacco use included chew. He reports that he does not drink alcohol or use drugs.  ROS: Please see flowsheet from today's date for complete review of systems.  Physical Exam: BP (!) 140/99 (BP Location: Right Arm)   Pulse 85   Temp 97.6 F (36.4 C) (Oral)   Resp 19   Ht 5' 9" (1.753 m)   Wt 95.9 kg   SpO2 90%   BMI 31.23 kg/m    Constitutional:  Alert and oriented, No acute distress. Cardiovascular: No clubbing, cyanosis, or edema. Respiratory: Normal  respiratory effort, no increased work of breathing. GI: Abdomen is soft, nontender, nondistended, no abdominal masses GU: No CVA tenderness, phallus without lesions, widely patent meatus, voiding faint pink urine spontaneously Lymph: No cervical or inguinal lymphadenopathy. Skin: No rashes, bruises or suspicious lesions. Neurologic: Grossly intact, no focal deficits, moving all 4 extremities. Psychiatric: Normal mood and affect.  Laboratory Data: Reviewed Creatinine 1.1, EGFR greater than 60 Hematocrit 43.5  Urinalysis with greater than 50 RBCs, 0 WBCs, no bacteria, nitrite negative   Pertinent Imaging: I have personally reviewed the CT hematuria work-up today.  There is no renal masses, urolithiasis, or upper tract filling defects.  There is a 4 cm soft tissue density at the base of the bladder most consistent with a median lobe of the prostate, is a stable since 2011.  Cannot exclude urothelial malignancy.  Assessment & Plan:   In summary, the patient is a comorbid 67-year-old male who presents to the hospital with dizziness and dysuria with very mild hematuria.  He was found to have a small pulmonary embolus and was started on a heparin drip for anticoagulation.  He is voiding light pink urine spontaneously.  CT urogram today demonstrated no renal masses, upper tract filling defects, urolithiasis, or hydronephrosis, however there is a 4 cm soft tissue density at the base of the bladder, most likely consistent with median lobe of the prostate as this is stable since 2011, however cannot completely rule out urothelial malignancy without direct visualization.  -Continue to push oral fluids for hydration in the setting of hematuria, call urology if unable to void with elevated postvoid residual -Finasteride added for likely prostatic bleeding -Will need follow-up in 1 to 2 weeks in urology clinic for clinic cystoscopy to complete hematuria work-up(arranged) -Call if questions   C  , MD  [facility] [address], [address] [place], [place] [address] [phone]   

## 2018-02-27 NOTE — Plan of Care (Signed)
Up to bathroom with 1 assist.  No respiratory distress noted.

## 2018-02-27 NOTE — Progress Notes (Signed)
*  PRELIMINARY RESULTS* Echocardiogram 2D Echocardiogram has been performed.  Wallie Char Adalina Dopson 02/27/2018, 11:32 AM

## 2018-02-27 NOTE — Progress Notes (Signed)
ANTICOAGULATION CONSULT NOTE  Pharmacy Consult for heparin Indication: pulmonary embolus  Patient Measurements: Height: 5\' 9"  (175.3 cm) Weight: 211 lb 8 oz (95.9 kg) IBW/kg (Calculated) : 70.7 Heparin Dosing Weight: 92.5 kg  Vital Signs: Temp: 98.3 F (36.8 C) (02/27 2047) Temp Source: Oral (02/27 2047) BP: 117/83 (02/27 2047) Pulse Rate: 87 (02/27 2047)  Labs: Recent Labs    02/26/18 0104 02/26/18 1538 02/26/18 1901 02/27/18 0214 02/27/18 1006 02/27/18 2008  HGB 16.7 17.5*  --  15.1  --   --   HCT 47.3 49.3  --  43.5  --   --   PLT 148* 161  --  137*  --   --   APTT  --   --  29  --   --   --   LABPROT  --   --  13.9  --   --   --   INR  --   --  1.1  --   --   --   HEPARINUNFRC  --   --   --  0.75* 0.75* 0.57  CREATININE 1.30* 1.30*  --  1.10  --   --     Estimated Creatinine Clearance: 75.5 mL/min (by C-G formula based on SCr of 1.1 mg/dL).   Medical History: Past Medical History:  Diagnosis Date  . Arthritis   . Diabetes mellitus without complication ([place])   . DVT (deep venous thrombosis) ([place]) 07/2017   nonocclusive DVT left internal jugular   . High cholesterol   . Hypertension   . Lumbar radiculitis   . Stroke [facility])    07/2017    Assessment: 68 year old male presented from PCP office with tachycardia. He was seen for blood in urine with pain/burning with urination. No anticoagulation PTA; nurse confirmed with patient. Urology is currently being consulted for hematuria. Hgb, PLT currently wnl  Goal of Therapy:  Heparin level 0.3-0.7 units/ml Monitor platelets by anticoagulation protocol: Yes   Heparin Course: 2/26 PM initiation: 4000 unit bolus, then 1450 units/hr 2/27 0200 HL 0.75: rate decreased to 1350 units/hr 2/27 1006 HL 0.75:  2/27 2000 HL = 0.57   Plan:  Will continue pt on current rate and recheck HL on 2/28 @ 0200.   [place] D Clinical Pharmacist 02/27/2018

## 2018-02-27 NOTE — Consult Note (Signed)
[facility] Cardiology  CARDIOLOGY CONSULT NOTE  Patient ID: Michael Humphrey MRN: [id number] DOB/AGE: July 11, 1950 68 y.o.  Admit date: 02/26/2018 Referring Physician Boyce Medici Primary Physician [facility] Primary Cardiologist None per patient Reason for Consultation Tachycardia and dizziness  HPI: 68 year old male referred for evaluation of tachycardia and dizziness in the setting of a pulmonary embolism. 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 The patient presented to [facility] ER yesterday, referred by his primary care provider, for hematuria, dysuria, tachycardia with heart rates in the 140s, dizziness, and multiple falls recently. Chest CT was positive for a small left sided pulmonary embolism. ECG revealed sinus tachycardia at a rate of 146 bpm with Q waves in leads III and aVF without acute ST-T wave abnormalities. The patient was started on heparin drip. The patient reports some pleuritic chest discomfort when he coughs. He denies exertional chest pain. He denies recurrent lightheadedness or dizziness. He denies palpitations. The patient reports bright red blood with urination and dysuria.   Review of systems complete and found to be negative unless listed above     Past Medical History:  Diagnosis Date  . Arthritis   . Diabetes mellitus without complication ([place])   . DVT (deep venous thrombosis) ([place]) 07/2017   nonocclusive DVT left internal jugular   . High cholesterol   . Hypertension   . Lumbar radiculitis   . Stroke [facility])    07/2017    Past Surgical History:  Procedure Laterality Date  . BUNIONECTOMY    . CATARACT EXTRACTION W/PHACO Left 12/11/2017   Procedure: CATARACT EXTRACTION PHACO AND INTRAOCULAR LENS PLACEMENT ([place])  LEFT DIABETIC;  Surgeon: Leandrew Koyanagi, MD;  Location: [facility];  Service: Ophthalmology;  Laterality: Left;  DIABETIC ([facility] picking pt up at 0600,  needs 0630 arrival time)  . CATARACT EXTRACTION W/PHACO Right 01/15/2018   Procedure: CATARACT EXTRACTION PHACO AND INTRAOCULAR LENS PLACEMENT ([place])  RIGHT DIABETIC  leave so arrival will be around 7:45 ds;  Surgeon: Leandrew Koyanagi, MD;  Location: [place];  Service: Ophthalmology;  Laterality: Right;  Diabetic - oral meds  . CHOLECYSTECTOMY    . COLONOSCOPY    . COLONOSCOPY WITH PROPOFOL N/A 12/17/2014   Procedure: COLONOSCOPY WITH PROPOFOL;  Surgeon: Lollie Sails, MD;  Location: [facility] ENDOSCOPY;  Service: Endoscopy;  Laterality: N/A;  . correct hammertoe    . JOINT REPLACEMENT    . KNEE ARTHROSCOPY Right   . TOTAL HIP ARTHROPLASTY    . TOTAL KNEE ARTHROPLASTY Right     Medications Prior to Admission  Medication Sig Dispense Refill Last Dose  . aspirin EC 81 MG tablet Take 81 mg by mouth daily.   02/26/2018 at 0500  . carvedilol (COREG) 12.5 MG tablet Take 12.5 mg by mouth daily.  11   . gabapentin (NEURONTIN) 300 MG capsule Take 300 mg by mouth 3 (three) times daily.   02/25/2018 at Unknown time  . glyBURIDE-metformin (GLUCOVANCE) 2.5-500 MG tablet Take 2 tablets by mouth 2 (two) times daily.   02/26/2018 at 0500  . spironolactone (ALDACTONE) 25 MG tablet Take 25 mg by mouth daily.   02/26/2018 at 0500  . torsemide (DEMADEX) 20 MG tablet Take 60 mg by mouth daily.    prn at prn  . traZODone (DESYREL) 100 MG tablet Take 1 tablet (100 mg total) by mouth at bedtime as needed for sleep. 30 tablet 0 prn at prn  Social History   Socioeconomic History  . Marital status: Married    Spouse name: Not on file  . Number of children: Not on file  . Years of education: Not on file  . Highest education level: Not on file  Occupational History  . Not on file  Social Needs  . Financial resource strain: Not on file  . Food insecurity:    Worry: Not on file    Inability: Not on file  . Transportation needs:    Medical: Not on file    Non-medical: Not on file  Tobacco Use  .  Smoking status: Never Smoker  . Smokeless tobacco: Former Systems developer    Types: Chew  Substance and Sexual Activity  . Alcohol use: No  . Drug use: No  . Sexual activity: Not on file  Lifestyle  . Physical activity:    Days per week: Not on file    Minutes per session: Not on file  . Stress: Not on file  Relationships  . Social connections:    Talks on phone: Not on file    Gets together: Not on file    Attends religious service: Not on file    Active member of club or organization: Not on file    Attends meetings of clubs or organizations: Not on file    Relationship status: Not on file  . Intimate partner violence:    Fear of current or ex partner: Not on file    Emotionally abused: Not on file    Physically abused: Not on file    Forced sexual activity: Not on file  Other Topics Concern  . Not on file  Social History Narrative  . Not on file    Family History  Problem Relation Age of Onset  . Clotting disorder Father   . Hypertension Father       Review of systems complete and found to be negative unless listed above      PHYSICAL EXAM  General: Well developed, well nourished, in no acute distress, sitting up in bed HEENT:  Normocephalic and atramatic Neck:  No JVD.  Lungs: normal effort of breathing on room air, no crackles or wheezing Heart: Regular, tachycardic. Normal S1 and S2 without gallops or murmurs.  Abdomen: Bowel sounds are positive, nondistended Msk:  Back normal, gait not assessed. Strength and tone appear normal for age Extremities: No clubbing, cyanosis or edema.   Neuro: Alert and oriented X 3. Psych:  Good affect, responds appropriately  Labs:   Lab Results  Component Value Date   WBC 6.2 02/27/2018   HGB 15.1 02/27/2018   HCT 43.5 02/27/2018   MCV 91.6 02/27/2018   PLT 137 (L) 02/27/2018    Recent Labs  Lab 02/26/18 0104  02/27/18 0214  NA 133*   < > 136  K 4.1   < > 3.5  CL 98   < > 106  CO2 23   < > 22  BUN 24*   < > 29*   CREATININE 1.30*   < > 1.10  CALCIUM 9.4   < > 8.8*  PROT 7.0  --   --   BILITOT 1.2  --   --   ALKPHOS 96  --   --   ALT 27  --   --   AST 34  --   --   GLUCOSE 314*   < > 185*   < > = values in this interval not displayed.  Lab Results  Component Value Date   TROPONINI <0.03 07/15/2017    Lab Results  Component Value Date   CHOL 172 07/16/2017   Lab Results  Component Value Date   HDL 25 (L) 07/16/2017   Lab Results  Component Value Date   LDLCALC 108 (H) 07/16/2017   Lab Results  Component Value Date   TRIG 194 (H) 07/16/2017   Lab Results  Component Value Date   CHOLHDL 6.9 07/16/2017   No results found for: LDLDIRECT    Radiology: Ct Head Wo Contrast  Result Date: 02/26/2018 CLINICAL DATA:  Hematuria.  Generalized weakness.  Recent falls. EXAM: CT HEAD WITHOUT CONTRAST TECHNIQUE: Contiguous axial images were obtained from the base of the skull through the vertex without intravenous contrast. COMPARISON:  Head CT-07/15/2017; parenchymal I-07/15/2017 FINDINGS: Brain: Similar findings of advanced atrophy with sulcal prominence and prominence of the bifrontal extra-axial spaces. Periventricular hypodensities compatible microvascular ischemic disease. Encephalomalacia involving the left centrum semiovale (image 17, series 2) and basal ganglia/insular cortex (image 14) as well as the contralateral right centrum semiovale (image 19, series 2). Given background parenchymal abnormalities, there is no CT evidence superimposed acute large territory infarct. No intraparenchymal or extra-axial or hemorrhage. Unchanged size and configuration of the ventricles and the basilar cisterns. No midline shift. Vascular: Intracranial atherosclerosis. Skull: No displaced calvarial fracture. Sinuses/Orbits: Limited visualization the paranasal sinuses and mastoid air cells is normal. No air-fluid levels. Debris is seen within the right external auditory canal. Post bilateral cataract surgery.  Other: Regional soft tissues appear normal. No displaced calvarial fracture IMPRESSION: Similar findings of atrophy and microvascular ischemic disease without superimposed acute intracranial process. Electronically Signed   By: Sandi Mariscal M.D.   On: 02/26/2018 16:47   Ct Angio Chest Pe W And/or Wo Contrast  Result Date: 02/26/2018 CLINICAL DATA:  Hypoxia EXAM: CT ANGIOGRAPHY CHEST WITH CONTRAST TECHNIQUE: Multidetector CT imaging of the chest was performed using the standard protocol during bolus administration of intravenous contrast. Multiplanar CT image reconstructions and MIPs were obtained to evaluate the vascular anatomy. CONTRAST:  59mL OMNIPAQUE IOHEXOL 350 MG/ML SOLN COMPARISON:  Chest radiograph and chest CT February 12, 2017. FINDINGS: Cardiovascular: There are small pulmonary emboli in peripheral left lower lobe pulmonary arteries supplying the medial and posterior segments of the left lower lobe. No more central pulmonary embolus evident. No demonstrable right heart strain. The aorta is tortuous. No evident thoracic aortic aneurysm or dissection. The visualized great vessels show areas calcification. There is aortic atherosclerosis. There are multiple foci of coronary artery calcification. There is a small amount of pericardial fluid which may be within the physiologic range. There is no pericardial thickening. Mediastinum/Nodes: Thyroid appears unremarkable. There is no appreciable thoracic adenopathy. No esophageal lesions are evident. Lungs/Pleura: There is no evident edema or consolidation. The nodular opacity seen previously in the left upper lobe has cleared since prior studies. There is mild bibasilar atelectasis. No appreciable pleural effusion or pleural thickening. Upper Abdomen: Gallbladder is absent. There are foci of calcification in the splenic artery. Visualized upper abdominal structures otherwise appear unremarkable. Musculoskeletal: There is degenerative change in the thoracic  spine. There are no blastic or lytic bone lesions. No evident chest wall lesions. Review of the MIP images confirms the above findings. IMPRESSION: 1. Small pulmonary emboli in peripheral left lower lobe pulmonary arteries supplying the medial and posterior segments of the left lower lobe. No more central pulmonary embolus evident. No evident right heart strain. 2. No thoracic aortic aneurysm  or dissection. Aortic atherosclerosis as well as foci great vessel and coronary artery calcification evident. 3. No edema or consolidation.  Areas of mild atelectatic change. 4. No demonstrable adenopathy. 5. Gallbladder absent. 6. Suspect pericardial fluid is within the physiologic range. Critical Value/emergent results were called by telephone at the time of interpretation on 02/26/2018 at 6:53 pm to Dr. Nance Pear , who verbally acknowledged these results. Aortic Atherosclerosis (ICD10-I70.0). Electronically Signed   By: Lowella Grip III M.D.   On: 02/26/2018 18:55    EKG: Sinus arrhthymia, 101 bpm  ASSESSMENT AND PLAN:  1. Pulmonary embolism, small left-sided, with associated tachycardia and dizziness. Appears hemodynamically stable. 2. Hematuria and dysuria; patient has bright red urine with >50 RBC in urinalysis 3. Tachycardia, rate adequately controlled and compensatory in the setting of PE 4. History of left internal jugular DVT in 07/2017 5. Hypertension, on metoprolol, spirolactone, torsemide  Recommendations: 1. Consider discontinuing heparin in light of bleeding with urination 2. Recommend urology consult 3. 2D echocardiogram 4. 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 Continue to monitor on telemetry  Signed: Clabe Seal PA-C 02/27/2018, 8:10 AM

## 2018-02-28 ENCOUNTER — Telehealth: Payer: Self-pay | Admitting: Urology

## 2018-02-28 LAB — CBC
HCT: 42.7 % (ref 39.0–52.0)
Hemoglobin: 15 g/dL (ref 13.0–17.0)
MCH: 32.2 pg (ref 26.0–34.0)
MCHC: 35.1 g/dL (ref 30.0–36.0)
MCV: 91.6 fL (ref 80.0–100.0)
PLATELETS: 141 10*3/uL — AB (ref 150–400)
RBC: 4.66 MIL/uL (ref 4.22–5.81)
RDW: 13.6 % (ref 11.5–15.5)
WBC: 5.5 10*3/uL (ref 4.0–10.5)
nRBC: 0 % (ref 0.0–0.2)

## 2018-02-28 LAB — GLUCOSE, CAPILLARY
GLUCOSE-CAPILLARY: 236 mg/dL — AB (ref 70–99)
Glucose-Capillary: 164 mg/dL — ABNORMAL HIGH (ref 70–99)

## 2018-02-28 LAB — HEPARIN LEVEL (UNFRACTIONATED)
Heparin Unfractionated: 0.75 IU/mL — ABNORMAL HIGH (ref 0.30–0.70)
Heparin Unfractionated: 0.68 IU/mL (ref 0.30–0.70)

## 2018-02-28 LAB — URINE CULTURE: Culture: NO GROWTH

## 2018-02-28 MED ORDER — CYCLOBENZAPRINE HCL 5 MG PO TABS: 5.0000 mg | ORAL_TABLET | Freq: Every evening | ORAL | Status: DC | PRN

## 2018-02-28 MED ORDER — APIXABAN 5 MG PO TABS: ORAL_TABLET | ORAL | 1 refills | Status: DC

## 2018-02-28 MED ORDER — LISINOPRIL 2.5 MG PO TABS: 2.5000 mg | ORAL_TABLET | Freq: Every day | ORAL | Status: DC

## 2018-02-28 MED ORDER — APIXABAN 5 MG PO TABS
10.0000 mg | ORAL_TABLET | Freq: Two times a day (BID) | ORAL | Status: DC
  Administered 2018-02-28: 10 mg via ORAL
  Filled 2018-02-28: qty 2

## 2018-02-28 MED ORDER — FINASTERIDE 5 MG PO TABS: 5.0000 mg | ORAL_TABLET | Freq: Every day | ORAL | 1 refills | Status: AC

## 2018-02-28 MED ORDER — DIAZEPAM 5 MG PO TABS: 5.0000 mg | ORAL_TABLET | Freq: Four times a day (QID) | ORAL | Status: DC | PRN

## 2018-02-28 MED ORDER — GUAIFENESIN-CODEINE 100-10 MG/5ML PO SOLN: 5.0000 mL | Freq: Four times a day (QID) | ORAL | 0 refills | Status: DC | PRN

## 2018-02-28 MED ORDER — ROSUVASTATIN CALCIUM 20 MG PO TABS: 20.0000 mg | ORAL_TABLET | Freq: Every day | ORAL | 0 refills | Status: DC

## 2018-02-28 NOTE — Telephone Encounter (Signed)
App made and given to Bertram on 2-A  [employer]

## 2018-02-28 NOTE — Plan of Care (Signed)
No hematuria noted this shift

## 2018-02-28 NOTE — Progress Notes (Signed)
ANTICOAGULATION CONSULT NOTE  Pharmacy Consult for heparin Indication: pulmonary embolus  Patient Measurements: Height: 5\' 9"  (175.3 cm) Weight: 211 lb 8 oz (95.9 kg) IBW/kg (Calculated) : 70.7 Heparin Dosing Weight: 92.5 kg  Vital Signs: Temp: 98.3 F (36.8 C) (02/27 2047) Temp Source: Oral (02/27 2047) BP: 117/83 (02/27 2047) Pulse Rate: 87 (02/27 2047)  Labs: Recent Labs    02/26/18 0104 02/26/18 1538 02/26/18 1901  02/27/18 0214 02/27/18 1006 02/27/18 2008 02/28/18 0155  HGB 16.7 17.5*  --   --  15.1  --   --  15.0  HCT 47.3 49.3  --   --  43.5  --   --  42.7  PLT 148* 161  --   --  137*  --   --  141*  APTT  --   --  29  --   --   --   --   --   LABPROT  --   --  13.9  --   --   --   --   --   INR  --   --  1.1  --   --   --   --   --   HEPARINUNFRC  --   --   --    < > 0.75* 0.75* 0.57 0.75*  CREATININE 1.30* 1.30*  --   --  1.10  --   --   --    < > = values in this interval not displayed.    Estimated Creatinine Clearance: 75.5 mL/min (by C-G formula based on SCr of 1.1 mg/dL).   Medical History: Past Medical History:  Diagnosis Date  . Arthritis   . Diabetes mellitus without complication ([place])   . DVT (deep venous thrombosis) ([place]) 07/2017   nonocclusive DVT left internal jugular   . High cholesterol   . Hypertension   . Lumbar radiculitis   . Stroke [facility])    07/2017    Assessment: 68 year old male presented from PCP office with tachycardia. He was seen for blood in urine with pain/burning with urination. No anticoagulation PTA; nurse confirmed with patient. Urology is currently being consulted for hematuria. Hgb, PLT currently wnl  Goal of Therapy:  Heparin level 0.3-0.7 units/ml Monitor platelets by anticoagulation protocol: Yes   Heparin Course: 2/26 PM initiation: 4000 unit bolus, then 1450 units/hr 2/27 0200 HL 0.75: rate decreased to 1350 units/hr 2/27 1006 HL 0.75:  2/27 2000 HL = 0.57   Plan:  02/28 @ 0200 HL 0.75 supratherapeutic.  Will decrease rate to 1150 units/hr and will recheck HL @ 1000, CBC stable will continue to monitor.  Tobie Lords, PharmD, BCPS Clinical Pharmacist 02/28/2018

## 2018-02-28 NOTE — Discharge Summary (Signed)
[facility] NAME: Michael Humphrey    MR#:  [id number]  DATE OF BIRTH:  [birth year]  DATE OF ADMISSION:  02/26/2018 ADMITTING PHYSICIAN: Vaughan Basta, MD  DATE OF DISCHARGE: 02/28/2018  PRIMARY CARE PHYSICIAN: Kirk Ruths, MD    ADMISSION DIAGNOSIS:  Sinus tachycardia [R00.0] Pulmonary embolism, other, unspecified chronicity, unspecified whether acute cor pulmonale present ([place]) [I26.99]  DISCHARGE DIAGNOSIS:  Principal Problem:   Pulmonary embolism (HCC) Active Problems:   Dizziness   Hematuria, gross   SECONDARY DIAGNOSIS:   Past Medical History:  Diagnosis Date  . Arthritis   . Diabetes mellitus without complication ([place])   . DVT (deep venous thrombosis) ([place]) 07/2017   nonocclusive DVT left internal jugular   . High cholesterol   . Hypertension   . Lumbar radiculitis   . Stroke [facility])    07/2017    HOSPITAL COURSE:   68 year old male with past medical history of diabetes, hypertension, hyperlipidemia, history of previous CVA, osteoarthritis who presented to the hospital due to tachycardia and underwent a CT scan of his chest which was positive for a small acute pulmonary embolism.  1.  Acute pulmonary embolism- this was a very small pulmonary embolism. -Patient was initially started on a heparin drip, but has now been switched over to oral Eliquis and is being discharged on that.  It is unclear if this is a provoked or unprovoked VTE regardless  patient will likely need at least 6 months of treatment.  2.  Tachycardia/dizziness- secondary to the pulmonary embolism. - Appreciate cardiology input, no acute intervention presently.  No acute arrhythmia on telemetry.  His echocardiogram showed normal ejection fraction of 50 to 55%.  No pulmonary hypertension noted on the echo.  3.  Hematuria- patient developed this while on the heparin drip.  A urology consult was obtained.  Patient underwent a CT urogram  which showed no evidence to explain any gross hematuria other than some mild BPH. -Hematuria has now resolved.  Patient was started on some finasteride and is currently being discharged on that.  Patient would likely benefit from an outpatient cystoscopy.  Patient to follow-up with urology as an outpatient with Dr. Diamantina Providence.   4.  Essential hypertension- pt. Will continue carvedilol and lisinopril  5.  Hyperlipidemia- pt. Will continue Crestor.  6.  History of chronic diastolic CHF- clinically patient was not in congestive heart failure while in the hospital.  Echocardiogram showing EF of 55 to 60%. - pt. Will Continue torsemide, Aldactone, carvedilol, lisinopril - follow up with Cardiology as outpatient.   DISCHARGE CONDITIONS:   Stable.   CONSULTS OBTAINED:  Treatment Team:  Isaias Cowman, MD Billey Co, MD  DRUG ALLERGIES:   Allergies  Allergen Reactions  . Oxycodone     hallucinations  . Clonidine Derivatives     Hallucinations   . Fioricet [Butalbital-Apap-Caffeine]     hallucination  . Hydrocodone-Acetaminophen     hallucinations  . Mobic [Meloxicam]     hallucinations  . Norvasc [Amlodipine Besylate]     hallucinations  . Statins     Muscle cramps  . Tramadol Itching    DISCHARGE MEDICATIONS:   Allergies as of 02/28/2018      Reactions   Oxycodone    hallucinations   Clonidine Derivatives    Hallucinations   Fioricet [butalbital-apap-caffeine]    hallucination   Hydrocodone-acetaminophen    hallucinations   Mobic [meloxicam]    hallucinations   Norvasc [  amlodipine Besylate]    hallucinations   Statins    Muscle cramps   Tramadol Itching      Medication List    TAKE these medications   apixaban 5 MG Tabs tablet Commonly known as:  ELIQUIS Take 10 mg PO BID x 7 days, then take 5 mg PO BID thereafter.   aspirin EC 81 MG tablet Take 81 mg by mouth daily.   carvedilol 12.5 MG tablet Commonly known as:  COREG Take 12.5 mg by  mouth daily.   cyclobenzaprine 5 MG tablet Commonly known as:  FLEXERIL Take 1-2 tablets (5-10 mg total) by mouth at bedtime as needed for muscle spasms.   diazepam 5 MG tablet Commonly known as:  VALIUM Take 1 tablet (5 mg total) by mouth every 6 (six) hours as needed for anxiety.   finasteride 5 MG tablet Commonly known as:  PROSCAR Take 1 tablet (5 mg total) by mouth daily. Start taking on:  March 01, 2018   gabapentin 300 MG capsule Commonly known as:  NEURONTIN Take 300 mg by mouth 3 (three) times daily.   glyBURIDE-metformin 2.5-500 MG tablet Commonly known as:  GLUCOVANCE Take 2 tablets by mouth 2 (two) times daily.   guaiFENesin-codeine 100-10 MG/5ML syrup Take 5 mLs by mouth every 6 (six) hours as needed for cough.   lisinopril 2.5 MG tablet Commonly known as:  PRINIVIL,ZESTRIL Take 1 tablet (2.5 mg total) by mouth daily.   rosuvastatin 20 MG tablet Commonly known as:  CRESTOR Take 1 tablet (20 mg total) by mouth daily for 30 days.   spironolactone 25 MG tablet Commonly known as:  ALDACTONE Take 25 mg by mouth daily.   torsemide 20 MG tablet Commonly known as:  DEMADEX Take 60 mg by mouth daily.   traZODone 100 MG tablet Commonly known as:  DESYREL Take 1 tablet (100 mg total) by mouth at bedtime as needed for sleep.         DISCHARGE INSTRUCTIONS:   DIET:  Cardiac diet and Diabetic diet  DISCHARGE CONDITION:  Stable  ACTIVITY:  Activity as tolerated  OXYGEN:  Home Oxygen: No.   Oxygen Delivery: room air  DISCHARGE LOCATION:  home   If you experience worsening of your admission symptoms, develop shortness of breath, life threatening emergency, suicidal or homicidal thoughts you must seek medical attention immediately by calling 911 or calling your MD immediately  if symptoms less severe.  You Must read complete instructions/literature along with all the possible adverse reactions/side effects for all the Medicines you take and that  have been prescribed to you. Take any new Medicines after you have completely understood and accpet all the possible adverse reactions/side effects.   Please note  You were cared for by a hospitalist during your hospital stay. If you have any questions about your discharge medications or the care you received while you were in the hospital after you are discharged, you can call the unit and asked to speak with the hospitalist on call if the hospitalist that took care of you is not available. Once you are discharged, your primary care physician will handle any further medical issues. Please note that NO REFILLS for any discharge medications will be authorized once you are discharged, as it is imperative that you return to your primary care physician (or establish a relationship with a primary care physician if you do not have one) for your aftercare needs so that they can reassess your need for medications and monitor your  lab values.     Today   Hematuria resolved, hemoglobin stable.  Patient denies any shortness of breath palpitations nausea or vomiting.  Will discharge home today.  VITAL SIGNS:  Blood pressure 113/84, pulse (!) 114, temperature 98.5 F (36.9 C), resp. rate 18, height 5\' 9"  (1.753 m), weight 95.9 kg, SpO2 95 %.  I/O:    Intake/Output Summary (Last 24 hours) at 02/28/2018 1453 Last data filed at 02/28/2018 0700 Gross per 24 hour  Intake 305.47 ml  Output 501 ml  Net -195.53 ml    PHYSICAL EXAMINATION:   GENERAL:  68 y.o.-year-old obese patient lying in bed in no acute distress.  EYES: Pupils equal, round, reactive to light and accommodation. No scleral icterus. Extraocular muscles intact.  HEENT: Head atraumatic, normocephalic. Oropharynx and nasopharynx clear.  NECK:  Supple, no jugular venous distention. No thyroid enlargement, no tenderness.  LUNGS: Normal breath sounds bilaterally, no wheezing, rales, rhonchi. No use of accessory muscles of respiration.   CARDIOVASCULAR: S1, S2 normal. No murmurs, rubs, or gallops.  ABDOMEN: Soft, nontender, nondistended. Bowel sounds present. No organomegaly or mass.  EXTREMITIES: No cyanosis, clubbing or edema b/l.    NEUROLOGIC: Cranial nerves II through XII are intact. No focal Motor or sensory deficits b/l. Globally weak.  PSYCHIATRIC: The patient is alert and oriented x 3.  SKIN: No obvious rash, lesion, or ulcer.   DATA REVIEW:   CBC Recent Labs  Lab 02/28/18 0155  WBC 5.5  HGB 15.0  HCT 42.7  PLT 141*    Chemistries  Recent Labs  Lab 02/26/18 0104  02/27/18 0214  NA 133*   < > 136  K 4.1   < > 3.5  CL 98   < > 106  CO2 23   < > 22  GLUCOSE 314*   < > 185*  BUN 24*   < > 29*  CREATININE 1.30*   < > 1.10  CALCIUM 9.4   < > 8.8*  AST 34  --   --   ALT 27  --   --   ALKPHOS 96  --   --   BILITOT 1.2  --   --    < > = values in this interval not displayed.    Cardiac Enzymes No results for input(s): TROPONINI in the last 168 hours.  Microbiology Results  Results for orders placed or performed during the hospital encounter of 02/26/18  Urine Culture     Status: None   Collection Time: 02/26/18  1:10 AM  Result Value Ref Range Status   Specimen Description   Final    URINE, RANDOM Performed at [facility], [address], [place], [place] [address]    Special Requests   Final    NONE Performed at [facility], [address], [place], [place] [address]    Culture   Final    NO GROWTH Performed at [facility], [place] [address], [place], [place] [address]    Report Status 02/28/2018 FINAL  Final    RADIOLOGY:  Ct Head Wo Contrast  Result Date: 02/26/2018 CLINICAL DATA:  Hematuria.  Generalized weakness.  Recent falls. EXAM: CT HEAD WITHOUT CONTRAST TECHNIQUE: Contiguous axial images were obtained from the base of the skull through the vertex without intravenous contrast. COMPARISON:  Head CT-07/15/2017; parenchymal I-07/15/2017 FINDINGS:  Brain: Similar findings of advanced atrophy with sulcal prominence and prominence of the bifrontal extra-axial spaces. Periventricular hypodensities compatible microvascular ischemic disease. Encephalomalacia  involving the left centrum semiovale (image 17, series 2) and basal ganglia/insular cortex (image 14) as well as the contralateral right centrum semiovale (image 19, series 2). Given background parenchymal abnormalities, there is no CT evidence superimposed acute large territory infarct. No intraparenchymal or extra-axial or hemorrhage. Unchanged size and configuration of the ventricles and the basilar cisterns. No midline shift. Vascular: Intracranial atherosclerosis. Skull: No displaced calvarial fracture. Sinuses/Orbits: Limited visualization the paranasal sinuses and mastoid air cells is normal. No air-fluid levels. Debris is seen within the right external auditory canal. Post bilateral cataract surgery. Other: Regional soft tissues appear normal. No displaced calvarial fracture IMPRESSION: Similar findings of atrophy and microvascular ischemic disease without superimposed acute intracranial process. Electronically Signed   By: Sandi Mariscal M.D.   On: 02/26/2018 16:47   Ct Angio Chest Pe W And/or Wo Contrast  Result Date: 02/26/2018 CLINICAL DATA:  Hypoxia EXAM: CT ANGIOGRAPHY CHEST WITH CONTRAST TECHNIQUE: Multidetector CT imaging of the chest was performed using the standard protocol during bolus administration of intravenous contrast. Multiplanar CT image reconstructions and MIPs were obtained to evaluate the vascular anatomy. CONTRAST:  41mL OMNIPAQUE IOHEXOL 350 MG/ML SOLN COMPARISON:  Chest radiograph and chest CT February 12, 2017. FINDINGS: Cardiovascular: There are small pulmonary emboli in peripheral left lower lobe pulmonary arteries supplying the medial and posterior segments of the left lower lobe. No more central pulmonary embolus evident. No demonstrable right heart strain. The aorta is  tortuous. No evident thoracic aortic aneurysm or dissection. The visualized great vessels show areas calcification. There is aortic atherosclerosis. There are multiple foci of coronary artery calcification. There is a small amount of pericardial fluid which may be within the physiologic range. There is no pericardial thickening. Mediastinum/Nodes: Thyroid appears unremarkable. There is no appreciable thoracic adenopathy. No esophageal lesions are evident. Lungs/Pleura: There is no evident edema or consolidation. The nodular opacity seen previously in the left upper lobe has cleared since prior studies. There is mild bibasilar atelectasis. No appreciable pleural effusion or pleural thickening. Upper Abdomen: Gallbladder is absent. There are foci of calcification in the splenic artery. Visualized upper abdominal structures otherwise appear unremarkable. Musculoskeletal: There is degenerative change in the thoracic spine. There are no blastic or lytic bone lesions. No evident chest wall lesions. Review of the MIP images confirms the above findings. IMPRESSION: 1. Small pulmonary emboli in peripheral left lower lobe pulmonary arteries supplying the medial and posterior segments of the left lower lobe. No more central pulmonary embolus evident. No evident right heart strain. 2. No thoracic aortic aneurysm or dissection. Aortic atherosclerosis as well as foci great vessel and coronary artery calcification evident. 3. No edema or consolidation.  Areas of mild atelectatic change. 4. No demonstrable adenopathy. 5. Gallbladder absent. 6. Suspect pericardial fluid is within the physiologic range. Critical Value/emergent results were called by telephone at the time of interpretation on 02/26/2018 at 6:53 pm to Dr. Nance Pear , who verbally acknowledged these results. Aortic Atherosclerosis (ICD10-I70.0). Electronically Signed   By: Lowella Grip III M.D.   On: 02/26/2018 18:55   US Venous Img Lower Bilateral  Result  Date: 02/27/2018 CLINICAL DATA:  68 year old male with a history of pulmonary embolism EXAM: BILATERAL LOWER EXTREMITY VENOUS DOPPLER ULTRASOUND TECHNIQUE: Gray-scale sonography with graded compression, as well as color Doppler and duplex ultrasound were performed to evaluate the lower extremity deep venous systems from the level of the common femoral vein and including the common femoral, femoral, profunda femoral, popliteal and calf veins including the  posterior tibial, peroneal and gastrocnemius veins when visible. The superficial great saphenous vein was also interrogated. Spectral Doppler was utilized to evaluate flow at rest and with distal augmentation maneuvers in the common femoral, femoral and popliteal veins. COMPARISON:  None. FINDINGS: RIGHT LOWER EXTREMITY Common Femoral Vein: No evidence of thrombus. Normal compressibility, respiratory phasicity and response to augmentation. Saphenofemoral Junction: No evidence of thrombus. Normal compressibility and flow on color Doppler imaging. Profunda Femoral Vein: No evidence of thrombus. Normal compressibility and flow on color Doppler imaging. Femoral Vein: No evidence of thrombus. Normal compressibility, respiratory phasicity and response to augmentation. Popliteal Vein: No evidence of thrombus. Normal compressibility, respiratory phasicity and response to augmentation. Calf Veins: No evidence of thrombus. Normal compressibility and flow on color Doppler imaging. Superficial Great Saphenous Vein: No evidence of thrombus. Normal compressibility and flow on color Doppler imaging. Other Findings:  None. LEFT LOWER EXTREMITY Common Femoral Vein: No evidence of thrombus. Normal compressibility, respiratory phasicity and response to augmentation. Saphenofemoral Junction: No evidence of thrombus. Normal compressibility and flow on color Doppler imaging. Profunda Femoral Vein: No evidence of thrombus. Normal compressibility and flow on color Doppler imaging. Femoral  Vein: No evidence of thrombus. Normal compressibility, respiratory phasicity and response to augmentation. Popliteal Vein: No evidence of thrombus. Normal compressibility, respiratory phasicity and response to augmentation. Calf Veins: No evidence of thrombus. Normal compressibility and flow on color Doppler imaging. Superficial Great Saphenous Vein: No evidence of thrombus. Normal compressibility and flow on color Doppler imaging. Other Findings:  None. IMPRESSION: Sonographic survey of the bilateral lower extremities negative for DVT Electronically Signed   By: Corrie Mckusick D.O.   On: 02/27/2018 16:38   Ct Hematuria Workup  Result Date: 02/27/2018 CLINICAL DATA:  Gross hematuria for 3 days EXAM: CT ABDOMEN AND PELVIS WITHOUT AND WITH CONTRAST TECHNIQUE: Multidetector CT imaging of the abdomen and pelvis was performed following the standard protocol before and following the bolus administration of intravenous contrast. CONTRAST:  191mL OMNIPAQUE IOHEXOL 300 MG/ML  SOLN COMPARISON:  09/01/2009 FINDINGS: Lower chest: 4 by 6 mm subpleural nodule in the left lower lobe on image 4/50, not changed from 02/12/2017 and likely benign. Trace anterior pericardial effusion. Right coronary artery atherosclerotic vascular disease. Hepatobiliary: 6 by 5 mm hypodense lesion in the dome of the liver near the junction of the right and left hepatic lobes on image 9/4, not well seen on 09/01/2009, technically too small to characterize although statistically likely to be a small cyst or similar benign lesion. Cholecystectomy. Pancreas: Unremarkable Spleen: Unremarkable Adrenals/Urinary Tract: Adrenal glands normal. No nephrolithiasis. 2.3 by 1.6 cm Bosniak category 1 cyst of the left mid kidney laterally, simple/benign. There is layering of contrast medium in the urinary bladder on the delayed images, probably from prolonged prone positioning, resulting in poor mixing. 4.4 by 3.3 by 3.5 cm soft tissue mass along the base of the  urinary bladder is probably a prominent median lobe of the prostate gland, and less likely to be urothelial tumor. No other filling defect along the urothelium identified. Stomach/Bowel: Descending and sigmoid colon diverticulosis. There are some scattered air-levels in the distal large bowel which could reflect a diarrheal process. I do not see definite findings of active diverticulitis. Vascular/Lymphatic: Aortoiliac atherosclerotic vascular disease. No pathologic adenopathy. Reproductive: Prostatomegaly noted. The prostate is partially obscured by streak artifact from the patient's hip implants despite the use of metal artifact reduction. Other: No supplemental non-categorized findings. Musculoskeletal: Bilateral hip prostheses. Lumbar spondylosis and degenerative disc disease causing  impingement at L3-4, L4-5, and L5-S1. Grade 1 degenerative anterolisthesis at L5-S1. IMPRESSION: 1. 4.4 by 3.3 by 3.5 cm soft tissue density lesion along the base the urinary bladder is probably a prominent median lobe of the prostate gland; there is of similar morphology in 2011 hence urothelial tumor is considered highly unlikely. Prostatomegaly is present. 2. A cause for the patient's gross hematuria is not appreciated. 3. Aortic Atherosclerosis (ICD10-I70.0). Right coronary artery atherosclerosis. 4. Nonspecific 6 mm lesion in the dome of the liver, probably benign but technically nonspecific. 5. Descending and sigmoid colon diverticulosis. Air fluid levels in the colon, diarrheal process not readily excluded. 6. Lumbar impingement at L3-4, L4-5, and L5-S1. Electronically Signed   By: Van Clines M.D.   On: 02/27/2018 12:38      Management plans discussed with the patient, family and they are in agreement.  CODE STATUS:     Code Status Orders  (From admission, onward)         Start     Ordered   02/26/18 2259  Do not attempt resuscitation (DNR)  Continuous    Question Answer Comment  In the event of  cardiac or respiratory ARREST Do not call a "code blue"   In the event of cardiac or respiratory ARREST Do not perform Intubation, CPR, defibrillation or ACLS   In the event of cardiac or respiratory ARREST Use medication by any route, position, wound care, and other measures to relive pain and suffering. May use oxygen, suction and manual treatment of airway obstruction as needed for comfort.      02/26/18 2259          TOTAL TIME TAKING CARE OF THIS PATIENT: 40 minutes.    Henreitta Leber M.D on 02/28/2018 at 2:53 PM  Between 7am to 6pm - Pager - [phone]  After 6pm go to [email or web address] - [employer] [facility]  Office  [phone]  CC: Primary care physician; Kirk Ruths, MD

## 2018-02-28 NOTE — Care Management Note (Signed)
Case Management Note  Patient Details  Name: Michael Humphrey MRN: [id number] Date of Birth: [birth year]  Subjective/Objective:     Patient DC to home today with wife.  Obtains prescriptions at [facility] without difficulties.  Current with PCP.  Independent in all ADL's .  Provided Eliquis coupon.  Patient has prescription coverage.                 Action/Plan:   Expected Discharge Date:  02/28/18               Expected Discharge Plan:  Home/Self Care  In-House Referral:     Discharge planning Services  CM Consult  Post Acute Care Choice:    Choice offered to:     DME Arranged:    DME Agency:     HH Arranged:    HH Agency:     Status of Service:  Completed, signed off  If discussed at [employer] of Stay Meetings, dates discussed:    Additional Comments:  Elza Rafter, RN 02/28/2018, 12:11 PM

## 2018-02-28 NOTE — Telephone Encounter (Signed)
-----   Message from Billey Co, MD sent at 02/27/2018  6:00 PM EST ----- Regarding: follow up cysto Please schedule clinic cysto in 2-3 weeks.  Nickolas Madrid, MD 02/27/2018

## 2018-02-28 NOTE — Progress Notes (Signed)
Discharge instructions explained to pt and pts wife/ verbalized an understanding/ iv and tele removed/ RX given to pt/ transported off unit via wheelchair.  

## 2018-02-28 NOTE — Care Management Important Message (Signed)
Copy of signed Medicare IM left with patient in room. 

## 2018-02-28 NOTE — Progress Notes (Signed)
Pt ambulated around nursing station/ tolerated well/ no c/o pain or sob

## 2018-03-03 ENCOUNTER — Other Ambulatory Visit: Payer: Self-pay

## 2018-03-03 NOTE — Patient Outreach (Signed)
[facility]) Care Management  03/03/2018  Michael Humphrey 04/19/1950 [id number]   EMMI- General Discharge RED ON EMMI ALERT Day # 1 Date: 03/02/2018 Red Alert Reason:  Unfilled prescription? Yes  Outreach attempt: spoke with patient.  CM introduced self.  Patient immediately states that he has everything he needs and if he has problems he will call his physician. Patient then ended call.     Plan: RN CM will close case.     Jone Baseman, RN, MSN [facility] Care Management Care Management Coordinator Direct Line [phone] Toll Free: [phone]  Fax: [phone]

## 2018-03-03 NOTE — Patient Outreach (Signed)
Unsuccessful telephone call to Mr. Palmero today.  Patient refuses to complete medication review as he sees his doctor Thursday who will review his medications with him.    Plan: Pharmacy available if needed for medication review.  Isaias Sakai, [place] D PGY1 Pharmacy Resident  03/03/2018      11:53 AM

## 2018-03-06 DIAGNOSIS — I2693 Single subsegmental pulmonary embolism without acute cor pulmonale: Secondary | ICD-10-CM | POA: Diagnosis not present

## 2018-03-10 ENCOUNTER — Telehealth: Payer: Self-pay | Admitting: Urology

## 2018-03-10 NOTE — Telephone Encounter (Signed)
FYI patient cx app for his cysto did not want to reschd

## 2018-03-18 ENCOUNTER — Other Ambulatory Visit: Payer: Self-pay | Admitting: Urology

## 2018-03-18 DIAGNOSIS — M25561 Pain in right knee: Secondary | ICD-10-CM | POA: Diagnosis not present

## 2018-03-18 DIAGNOSIS — Z96643 Presence of artificial hip joint, bilateral: Secondary | ICD-10-CM | POA: Diagnosis not present

## 2018-03-18 DIAGNOSIS — M25551 Pain in right hip: Secondary | ICD-10-CM | POA: Diagnosis not present

## 2018-03-18 DIAGNOSIS — M25552 Pain in left hip: Secondary | ICD-10-CM | POA: Diagnosis not present

## 2018-03-18 DIAGNOSIS — M25562 Pain in left knee: Secondary | ICD-10-CM | POA: Diagnosis not present

## 2018-03-18 DIAGNOSIS — Z96653 Presence of artificial knee joint, bilateral: Secondary | ICD-10-CM | POA: Diagnosis not present

## 2018-04-14 DIAGNOSIS — E113311 Type 2 diabetes mellitus with moderate nonproliferative diabetic retinopathy with macular edema, right eye: Secondary | ICD-10-CM | POA: Diagnosis not present

## 2018-06-05 DIAGNOSIS — E1122 Type 2 diabetes mellitus with diabetic chronic kidney disease: Secondary | ICD-10-CM | POA: Diagnosis not present

## 2018-06-05 DIAGNOSIS — I10 Essential (primary) hypertension: Secondary | ICD-10-CM | POA: Diagnosis not present

## 2018-06-05 DIAGNOSIS — N183 Chronic kidney disease, stage 3 (moderate): Secondary | ICD-10-CM | POA: Diagnosis not present

## 2018-06-05 DIAGNOSIS — E7800 Pure hypercholesterolemia, unspecified: Secondary | ICD-10-CM | POA: Diagnosis not present

## 2018-06-05 DIAGNOSIS — I129 Hypertensive chronic kidney disease with stage 1 through stage 4 chronic kidney disease, or unspecified chronic kidney disease: Secondary | ICD-10-CM | POA: Diagnosis not present

## 2018-06-12 DIAGNOSIS — I2693 Single subsegmental pulmonary embolism without acute cor pulmonale: Secondary | ICD-10-CM | POA: Diagnosis not present

## 2018-06-12 DIAGNOSIS — T466X5A Adverse effect of antihyperlipidemic and antiarteriosclerotic drugs, initial encounter: Secondary | ICD-10-CM | POA: Diagnosis not present

## 2018-06-12 DIAGNOSIS — Z6835 Body mass index (BMI) 35.0-35.9, adult: Secondary | ICD-10-CM | POA: Diagnosis not present

## 2018-06-12 DIAGNOSIS — I10 Essential (primary) hypertension: Secondary | ICD-10-CM | POA: Diagnosis not present

## 2018-06-12 DIAGNOSIS — E1122 Type 2 diabetes mellitus with diabetic chronic kidney disease: Secondary | ICD-10-CM | POA: Diagnosis not present

## 2018-06-12 DIAGNOSIS — M791 Myalgia, unspecified site: Secondary | ICD-10-CM | POA: Diagnosis not present

## 2018-06-12 DIAGNOSIS — I779 Disorder of arteries and arterioles, unspecified: Secondary | ICD-10-CM | POA: Diagnosis not present

## 2018-06-12 DIAGNOSIS — I129 Hypertensive chronic kidney disease with stage 1 through stage 4 chronic kidney disease, or unspecified chronic kidney disease: Secondary | ICD-10-CM | POA: Diagnosis not present

## 2018-06-12 DIAGNOSIS — G4733 Obstructive sleep apnea (adult) (pediatric): Secondary | ICD-10-CM | POA: Diagnosis not present

## 2018-06-12 DIAGNOSIS — Z0000 Encounter for general adult medical examination without abnormal findings: Secondary | ICD-10-CM | POA: Diagnosis not present

## 2018-06-12 DIAGNOSIS — N183 Chronic kidney disease, stage 3 (moderate): Secondary | ICD-10-CM | POA: Diagnosis not present

## 2018-06-24 DIAGNOSIS — M5136 Other intervertebral disc degeneration, lumbar region: Secondary | ICD-10-CM | POA: Diagnosis not present

## 2018-06-24 DIAGNOSIS — M5416 Radiculopathy, lumbar region: Secondary | ICD-10-CM | POA: Diagnosis not present

## 2018-07-17 DIAGNOSIS — E113311 Type 2 diabetes mellitus with moderate nonproliferative diabetic retinopathy with macular edema, right eye: Secondary | ICD-10-CM | POA: Diagnosis not present

## 2018-08-06 DIAGNOSIS — H4311 Vitreous hemorrhage, right eye: Secondary | ICD-10-CM | POA: Diagnosis not present

## 2018-10-07 DIAGNOSIS — M48062 Spinal stenosis, lumbar region with neurogenic claudication: Secondary | ICD-10-CM | POA: Diagnosis not present

## 2018-10-07 DIAGNOSIS — M5416 Radiculopathy, lumbar region: Secondary | ICD-10-CM | POA: Diagnosis not present

## 2018-10-27 DIAGNOSIS — L03011 Cellulitis of right finger: Secondary | ICD-10-CM | POA: Diagnosis not present

## 2018-11-03 DIAGNOSIS — N183 Chronic kidney disease, stage 3 unspecified: Secondary | ICD-10-CM | POA: Diagnosis not present

## 2018-11-03 DIAGNOSIS — I779 Disorder of arteries and arterioles, unspecified: Secondary | ICD-10-CM | POA: Diagnosis not present

## 2018-11-03 DIAGNOSIS — E1122 Type 2 diabetes mellitus with diabetic chronic kidney disease: Secondary | ICD-10-CM | POA: Diagnosis not present

## 2018-11-03 DIAGNOSIS — Z6835 Body mass index (BMI) 35.0-35.9, adult: Secondary | ICD-10-CM | POA: Diagnosis not present

## 2018-11-03 DIAGNOSIS — I10 Essential (primary) hypertension: Secondary | ICD-10-CM | POA: Diagnosis not present

## 2018-11-03 DIAGNOSIS — I129 Hypertensive chronic kidney disease with stage 1 through stage 4 chronic kidney disease, or unspecified chronic kidney disease: Secondary | ICD-10-CM | POA: Diagnosis not present

## 2018-12-05 ENCOUNTER — Other Ambulatory Visit: Payer: Self-pay | Admitting: Student

## 2018-12-05 DIAGNOSIS — M5441 Lumbago with sciatica, right side: Secondary | ICD-10-CM | POA: Diagnosis not present

## 2018-12-05 DIAGNOSIS — M5416 Radiculopathy, lumbar region: Secondary | ICD-10-CM | POA: Diagnosis not present

## 2018-12-05 DIAGNOSIS — M5442 Lumbago with sciatica, left side: Secondary | ICD-10-CM | POA: Diagnosis not present

## 2018-12-05 DIAGNOSIS — M48061 Spinal stenosis, lumbar region without neurogenic claudication: Secondary | ICD-10-CM | POA: Diagnosis not present

## 2018-12-05 DIAGNOSIS — G8929 Other chronic pain: Secondary | ICD-10-CM

## 2018-12-12 DIAGNOSIS — E1122 Type 2 diabetes mellitus with diabetic chronic kidney disease: Secondary | ICD-10-CM | POA: Diagnosis not present

## 2018-12-12 DIAGNOSIS — I10 Essential (primary) hypertension: Secondary | ICD-10-CM | POA: Diagnosis not present

## 2018-12-12 DIAGNOSIS — N183 Chronic kidney disease, stage 3 unspecified: Secondary | ICD-10-CM | POA: Diagnosis not present

## 2018-12-12 DIAGNOSIS — I129 Hypertensive chronic kidney disease with stage 1 through stage 4 chronic kidney disease, or unspecified chronic kidney disease: Secondary | ICD-10-CM | POA: Diagnosis not present

## 2018-12-12 DIAGNOSIS — G4733 Obstructive sleep apnea (adult) (pediatric): Secondary | ICD-10-CM | POA: Diagnosis not present

## 2018-12-17 ENCOUNTER — Ambulatory Visit
Admission: RE | Admit: 2018-12-17 | Discharge: 2018-12-17 | Disposition: A | Discharge status: Home / Self Care | Payer: PPO | Source: Ambulatory Visit | Attending: Student | Admitting: Student

## 2018-12-17 ENCOUNTER — Other Ambulatory Visit: Payer: Self-pay

## 2018-12-17 DIAGNOSIS — M5441 Lumbago with sciatica, right side: Secondary | ICD-10-CM | POA: Diagnosis not present

## 2018-12-17 DIAGNOSIS — M5442 Lumbago with sciatica, left side: Secondary | ICD-10-CM | POA: Diagnosis not present

## 2018-12-17 DIAGNOSIS — G8929 Other chronic pain: Secondary | ICD-10-CM | POA: Diagnosis not present

## 2018-12-17 DIAGNOSIS — M545 Low back pain: Secondary | ICD-10-CM | POA: Diagnosis not present

## 2018-12-19 DIAGNOSIS — Z6835 Body mass index (BMI) 35.0-35.9, adult: Secondary | ICD-10-CM | POA: Diagnosis not present

## 2018-12-19 DIAGNOSIS — E7800 Pure hypercholesterolemia, unspecified: Secondary | ICD-10-CM | POA: Diagnosis not present

## 2018-12-19 DIAGNOSIS — I129 Hypertensive chronic kidney disease with stage 1 through stage 4 chronic kidney disease, or unspecified chronic kidney disease: Secondary | ICD-10-CM | POA: Diagnosis not present

## 2018-12-19 DIAGNOSIS — I10 Essential (primary) hypertension: Secondary | ICD-10-CM | POA: Diagnosis not present

## 2018-12-19 DIAGNOSIS — N183 Chronic kidney disease, stage 3 unspecified: Secondary | ICD-10-CM | POA: Diagnosis not present

## 2018-12-19 DIAGNOSIS — I779 Disorder of arteries and arterioles, unspecified: Secondary | ICD-10-CM | POA: Diagnosis not present

## 2018-12-19 DIAGNOSIS — E1122 Type 2 diabetes mellitus with diabetic chronic kidney disease: Secondary | ICD-10-CM | POA: Diagnosis not present

## 2018-12-19 DIAGNOSIS — G4733 Obstructive sleep apnea (adult) (pediatric): Secondary | ICD-10-CM | POA: Diagnosis not present

## 2019-01-01 DIAGNOSIS — M5416 Radiculopathy, lumbar region: Secondary | ICD-10-CM | POA: Diagnosis not present

## 2019-01-01 DIAGNOSIS — M6281 Muscle weakness (generalized): Secondary | ICD-10-CM | POA: Diagnosis not present

## 2019-01-07 DIAGNOSIS — M5416 Radiculopathy, lumbar region: Secondary | ICD-10-CM | POA: Diagnosis not present

## 2019-01-07 DIAGNOSIS — M6281 Muscle weakness (generalized): Secondary | ICD-10-CM | POA: Diagnosis not present

## 2019-01-08 ENCOUNTER — Other Ambulatory Visit: Payer: Self-pay | Admitting: Neurosurgery

## 2019-01-08 DIAGNOSIS — R2689 Other abnormalities of gait and mobility: Secondary | ICD-10-CM | POA: Diagnosis not present

## 2019-01-08 DIAGNOSIS — M48062 Spinal stenosis, lumbar region with neurogenic claudication: Secondary | ICD-10-CM

## 2019-01-19 ENCOUNTER — Ambulatory Visit
Admission: RE | Admit: 2019-01-19 | Discharge: 2019-01-19 | Disposition: A | Discharge status: Home / Self Care | Payer: PPO | Source: Ambulatory Visit | Attending: Neurosurgery | Admitting: Neurosurgery

## 2019-01-19 ENCOUNTER — Other Ambulatory Visit: Payer: Self-pay

## 2019-01-19 DIAGNOSIS — R2689 Other abnormalities of gait and mobility: Secondary | ICD-10-CM | POA: Diagnosis not present

## 2019-01-19 DIAGNOSIS — M546 Pain in thoracic spine: Secondary | ICD-10-CM | POA: Diagnosis not present

## 2019-01-19 DIAGNOSIS — M48062 Spinal stenosis, lumbar region with neurogenic claudication: Secondary | ICD-10-CM | POA: Diagnosis not present

## 2019-01-21 DIAGNOSIS — M48062 Spinal stenosis, lumbar region with neurogenic claudication: Secondary | ICD-10-CM | POA: Diagnosis not present

## 2019-01-21 DIAGNOSIS — R2689 Other abnormalities of gait and mobility: Secondary | ICD-10-CM | POA: Diagnosis not present

## 2019-01-29 ENCOUNTER — Other Ambulatory Visit: Payer: Self-pay | Admitting: Neurosurgery

## 2019-02-09 ENCOUNTER — Other Ambulatory Visit: Payer: Self-pay

## 2019-02-09 ENCOUNTER — Encounter
Admission: RE | Admit: 2019-02-09 | Discharge: 2019-02-09 | Disposition: A | Discharge status: Home / Self Care | Payer: PPO | Source: Ambulatory Visit | Attending: Neurosurgery | Admitting: Neurosurgery

## 2019-02-09 ENCOUNTER — Ambulatory Visit: Payer: PPO | Attending: Internal Medicine

## 2019-02-09 DIAGNOSIS — I10 Essential (primary) hypertension: Secondary | ICD-10-CM | POA: Insufficient documentation

## 2019-02-09 DIAGNOSIS — Z23 Encounter for immunization: Secondary | ICD-10-CM | POA: Insufficient documentation

## 2019-02-09 DIAGNOSIS — E118 Type 2 diabetes mellitus with unspecified complications: Secondary | ICD-10-CM | POA: Insufficient documentation

## 2019-02-09 DIAGNOSIS — Z01818 Encounter for other preprocedural examination: Secondary | ICD-10-CM | POA: Diagnosis not present

## 2019-02-09 HISTORY — DX: Gastro-esophageal reflux disease without esophagitis: K21.9

## 2019-02-09 HISTORY — DX: Malignant (primary) neoplasm, unspecified: C80.1

## 2019-02-09 NOTE — Patient Instructions (Signed)
Your procedure is scheduled on: 02-18-19 [facility] Report to Same Day Surgery 2nd floor medical mall [facility] Entrance-take elevator on left to 2nd floor.  Check in with surgery information desk.) To find out your arrival time please call [phone] between 1PM - 3PM on 02-17-19 TUESDAY  Remember: Instructions that are not followed completely may result in serious medical risk, up to and including death, or upon the discretion of your surgeon and anesthesiologist your surgery may need to be rescheduled.    _x___ 1. Do not eat food after midnight the night before your procedure. NO GUM OR CANDY AFTER MIDNIGHT. You may drink WATER up to 2 hours before you are scheduled to arrive at the hospital for your procedure.  Do not drink WATER within 2 hours of your scheduled arrival to the hospital.  Type 1 and type 2 diabetics should only drink water.   ____Ensure clear carbohydrate drink on the way to the hospital for bariatric patients  ____Ensure clear carbohydrate drink 3 hours before surgery.     __x__ 2. No Alcohol for 24 hours before or after surgery.   __x__3. No Smoking or e-cigarettes for 24 prior to surgery.  Do not use any chewable tobacco products for at least 6 hour prior to surgery   ____  4. Bring all medications with you on the day of surgery if instructed.    __x__ 5. Notify your doctor if there is any change in your medical condition     (cold, fever, infections).    x___6. On the morning of surgery brush your teeth with toothpaste and water.  You may rinse your mouth with mouth wash if you wish.  Do not swallow any toothpaste or mouthwash.   Do not wear jewelry, make-up, hairpins, clips or nail polish.  Do not wear lotions, powders, or perfumes.  Do not shave 48 hours prior to surgery. Men may shave face and neck.  Do not bring valuables to the hospital.    [facility] is not responsible for any belongings or valuables.               Contacts, dentures or bridgework  may not be worn into surgery.  Leave your suitcase in the car. After surgery it may be brought to your room.  For patients admitted to the hospital, discharge time is determined by your treatment team.  _  Patients discharged the day of surgery will not be allowed to drive home.  You will need someone to drive you home and stay with you the night of your procedure.    Please read over the following fact sheets that you were given:   [facility] Preparing for Surgery and MRSA Information   _x___ TAKE THE FOLLOWING MEDICATION THE MORNING OF SURGERY WITH A SMALL SIP OF WATER. These include:  1. COREG (CARVEDILOL)  2. GABAPENTIN (NEURONTIN)  3. PROSCAR (FINASTERIDE)  4.  5.  6.  ____Fleets enema or Magnesium Citrate as directed.   _x___ Use CHG Soap or sage wipes as directed on instruction sheet   ____ Use inhalers on the day of surgery and bring to hospital day of surgery  _X___ Stop Metformin 2 days prior to surgery-LAST DOSE ON Sunday (02-15-19)   ____ Take 1/2 of usual insulin dose the night before surgery and none on the morning surgery.   _x___ Follow recommendations from Cardiologist, Pulmonologist or PCP regarding stopping Aspirin, Coumadin, Plavix ,Eliquis, Effient, or Pradaxa, and Pletal-CALL DR Rhea Bleacher OFFICE REGARDING STOPPING  YOUR ASPIRIN AND ELIQUIS  X____Stop Anti-inflammatories such as Advil, Aleve, Ibuprofen, Motrin, Naproxen, Naprosyn, Goodies powders or aspirin products 7 DAYS PRIOR TO SURGERY-OK to take Tylenol    ____ Stop supplements until after surgery.     ____ Bring C-Pap to the hospital.

## 2019-02-09 NOTE — Progress Notes (Signed)
   Covid-19 Vaccination Clinic  Name:  Michael Humphrey    MRN: [id number] DOB: [birth year]  02/09/2019  Mr. Cancilla was observed post Covid-19 immunization for 30 minutes based on pre-vaccination screening without incidence. He was provided with Vaccine Information Sheet and instruction to access the V-Safe system.   Mr. Delcore was instructed to call 911 with any severe reactions post vaccine: Marland Kitchen Difficulty breathing  . Swelling of your face and throat  . A fast heartbeat  . A bad rash all over your body  . Dizziness and weakness    Immunizations Administered    Name Date Dose VIS Date Route   Moderna COVID-19 Vaccine 02/09/2019  1:37 PM 0.5 mL 12/02/2018 Intramuscular   Manufacturer: Moderna   Lot: [id number]   [id number]

## 2019-02-10 ENCOUNTER — Encounter
Admission: RE | Admit: 2019-02-10 | Discharge: 2019-02-10 | Disposition: A | Discharge status: Home / Self Care | Payer: PPO | Source: Ambulatory Visit | Attending: Neurosurgery | Admitting: Neurosurgery

## 2019-02-10 DIAGNOSIS — Z01810 Encounter for preprocedural cardiovascular examination: Secondary | ICD-10-CM | POA: Insufficient documentation

## 2019-02-10 DIAGNOSIS — Z01818 Encounter for other preprocedural examination: Secondary | ICD-10-CM | POA: Diagnosis not present

## 2019-02-10 DIAGNOSIS — I10 Essential (primary) hypertension: Secondary | ICD-10-CM | POA: Diagnosis not present

## 2019-02-10 DIAGNOSIS — E119 Type 2 diabetes mellitus without complications: Secondary | ICD-10-CM | POA: Insufficient documentation

## 2019-02-10 DIAGNOSIS — Z01812 Encounter for preprocedural laboratory examination: Secondary | ICD-10-CM | POA: Insufficient documentation

## 2019-02-10 LAB — URINALYSIS, ROUTINE W REFLEX MICROSCOPIC
Bacteria, UA: NONE SEEN
Bilirubin Urine: NEGATIVE
Glucose, UA: NEGATIVE mg/dL
Hgb urine dipstick: NEGATIVE
Ketones, ur: NEGATIVE mg/dL
Leukocytes,Ua: NEGATIVE
Nitrite: NEGATIVE
Protein, ur: 30 mg/dL — AB
Specific Gravity, Urine: 1.013 (ref 1.005–1.030)
Squamous Epithelial / HPF: NONE SEEN (ref 0–5)
pH: 5 (ref 5.0–8.0)

## 2019-02-10 LAB — CBC
HCT: 44.8 % (ref 39.0–52.0)
Hemoglobin: 15.2 g/dL (ref 13.0–17.0)
MCH: 31.3 pg (ref 26.0–34.0)
MCHC: 33.9 g/dL (ref 30.0–36.0)
MCV: 92.2 fL (ref 80.0–100.0)
Platelets: 160 10*3/uL (ref 150–400)
RBC: 4.86 MIL/uL (ref 4.22–5.81)
RDW: 13.6 % (ref 11.5–15.5)
WBC: 6.3 10*3/uL (ref 4.0–10.5)
nRBC: 0 % (ref 0.0–0.2)

## 2019-02-10 LAB — BASIC METABOLIC PANEL
Anion gap: 9 (ref 5–15)
BUN: 18 mg/dL (ref 8–23)
CO2: 26 mmol/L (ref 22–32)
Calcium: 9.1 mg/dL (ref 8.9–10.3)
Chloride: 102 mmol/L (ref 98–111)
Creatinine, Ser: 1.11 mg/dL (ref 0.61–1.24)
GFR calc Af Amer: >60 mL/min (ref >60)
GFR calc non Af Amer: >60 mL/min (ref >60)
Glucose, Bld: 175 mg/dL — ABNORMAL HIGH (ref 70–99)
Potassium: 3.9 mmol/L (ref 3.5–5.1)
Sodium: 137 mmol/L (ref 135–145)

## 2019-02-10 LAB — APTT: aPTT: 34 seconds (ref 24–36)

## 2019-02-10 LAB — TYPE AND SCREEN
ABO/RH(D): O POS
Antibody Screen: NEGATIVE

## 2019-02-10 LAB — SURGICAL PCR SCREEN
MRSA, PCR: NEGATIVE
Staphylococcus aureus: POSITIVE — AB

## 2019-02-10 LAB — PROTIME-INR
INR: 1 (ref 0.8–1.2)
Prothrombin Time: 13.4 seconds (ref 11.4–15.2)

## 2019-02-16 ENCOUNTER — Other Ambulatory Visit: Payer: Self-pay

## 2019-02-16 ENCOUNTER — Other Ambulatory Visit
Admission: RE | Admit: 2019-02-16 | Discharge: 2019-02-16 | Disposition: A | Discharge status: Home / Self Care | Payer: PPO | Source: Ambulatory Visit | Attending: Neurosurgery | Admitting: Neurosurgery

## 2019-02-16 DIAGNOSIS — Z01812 Encounter for preprocedural laboratory examination: Secondary | ICD-10-CM | POA: Diagnosis not present

## 2019-02-16 DIAGNOSIS — Z20822 Contact with and (suspected) exposure to covid-19: Secondary | ICD-10-CM | POA: Diagnosis not present

## 2019-02-17 LAB — SARS CORONAVIRUS 2 (TAT 6-24 HRS): SARS Coronavirus 2: NEGATIVE

## 2019-02-18 ENCOUNTER — Ambulatory Visit: Payer: PPO

## 2019-02-18 ENCOUNTER — Ambulatory Visit
Admission: RE | Admit: 2019-02-18 | Discharge: 2019-02-18 | Disposition: A | Discharge status: Home / Self Care | Payer: PPO | Attending: Neurosurgery | Admitting: Neurosurgery

## 2019-02-18 ENCOUNTER — Encounter
Admission: RE | Disposition: A | Discharge status: Home / Self Care | Payer: Self-pay | Source: Home / Self Care | Attending: Neurosurgery

## 2019-02-18 ENCOUNTER — Encounter: Payer: Self-pay | Admitting: Neurosurgery

## 2019-02-18 ENCOUNTER — Other Ambulatory Visit: Payer: Self-pay

## 2019-02-18 DIAGNOSIS — E7800 Pure hypercholesterolemia, unspecified: Secondary | ICD-10-CM | POA: Diagnosis not present

## 2019-02-18 DIAGNOSIS — M5416 Radiculopathy, lumbar region: Secondary | ICD-10-CM | POA: Diagnosis not present

## 2019-02-18 DIAGNOSIS — E119 Type 2 diabetes mellitus without complications: Secondary | ICD-10-CM | POA: Diagnosis not present

## 2019-02-18 DIAGNOSIS — Z7982 Long term (current) use of aspirin: Secondary | ICD-10-CM | POA: Insufficient documentation

## 2019-02-18 DIAGNOSIS — Z832 Family history of diseases of the blood and blood-forming organs and certain disorders involving the immune mechanism: Secondary | ICD-10-CM | POA: Diagnosis not present

## 2019-02-18 DIAGNOSIS — Z96653 Presence of artificial knee joint, bilateral: Secondary | ICD-10-CM | POA: Diagnosis not present

## 2019-02-18 DIAGNOSIS — Z9049 Acquired absence of other specified parts of digestive tract: Secondary | ICD-10-CM | POA: Insufficient documentation

## 2019-02-18 DIAGNOSIS — I10 Essential (primary) hypertension: Secondary | ICD-10-CM | POA: Insufficient documentation

## 2019-02-18 DIAGNOSIS — Z7984 Long term (current) use of oral hypoglycemic drugs: Secondary | ICD-10-CM | POA: Diagnosis not present

## 2019-02-18 DIAGNOSIS — M1990 Unspecified osteoarthritis, unspecified site: Secondary | ICD-10-CM | POA: Diagnosis not present

## 2019-02-18 DIAGNOSIS — I69398 Other sequelae of cerebral infarction: Secondary | ICD-10-CM | POA: Insufficient documentation

## 2019-02-18 DIAGNOSIS — Z8673 Personal history of transient ischemic attack (TIA), and cerebral infarction without residual deficits: Secondary | ICD-10-CM | POA: Diagnosis not present

## 2019-02-18 DIAGNOSIS — Z885 Allergy status to narcotic agent status: Secondary | ICD-10-CM | POA: Insufficient documentation

## 2019-02-18 DIAGNOSIS — Z419 Encounter for procedure for purposes other than remedying health state, unspecified: Secondary | ICD-10-CM

## 2019-02-18 DIAGNOSIS — E785 Hyperlipidemia, unspecified: Secondary | ICD-10-CM | POA: Diagnosis not present

## 2019-02-18 DIAGNOSIS — Z818 Family history of other mental and behavioral disorders: Secondary | ICD-10-CM | POA: Insufficient documentation

## 2019-02-18 DIAGNOSIS — I69359 Hemiplegia and hemiparesis following cerebral infarction affecting unspecified side: Secondary | ICD-10-CM | POA: Insufficient documentation

## 2019-02-18 DIAGNOSIS — Z8261 Family history of arthritis: Secondary | ICD-10-CM | POA: Insufficient documentation

## 2019-02-18 DIAGNOSIS — Z86010 Personal history of colonic polyps: Secondary | ICD-10-CM | POA: Diagnosis not present

## 2019-02-18 DIAGNOSIS — Z888 Allergy status to other drugs, medicaments and biological substances status: Secondary | ICD-10-CM | POA: Diagnosis not present

## 2019-02-18 DIAGNOSIS — Z86711 Personal history of pulmonary embolism: Secondary | ICD-10-CM | POA: Insufficient documentation

## 2019-02-18 DIAGNOSIS — Z8249 Family history of ischemic heart disease and other diseases of the circulatory system: Secondary | ICD-10-CM | POA: Diagnosis not present

## 2019-02-18 DIAGNOSIS — E11319 Type 2 diabetes mellitus with unspecified diabetic retinopathy without macular edema: Secondary | ICD-10-CM | POA: Insufficient documentation

## 2019-02-18 DIAGNOSIS — Z79899 Other long term (current) drug therapy: Secondary | ICD-10-CM | POA: Diagnosis not present

## 2019-02-18 DIAGNOSIS — Z87891 Personal history of nicotine dependence: Secondary | ICD-10-CM | POA: Diagnosis not present

## 2019-02-18 DIAGNOSIS — M48062 Spinal stenosis, lumbar region with neurogenic claudication: Secondary | ICD-10-CM | POA: Insufficient documentation

## 2019-02-18 DIAGNOSIS — Z96643 Presence of artificial hip joint, bilateral: Secondary | ICD-10-CM | POA: Diagnosis not present

## 2019-02-18 DIAGNOSIS — K219 Gastro-esophageal reflux disease without esophagitis: Secondary | ICD-10-CM | POA: Diagnosis not present

## 2019-02-18 DIAGNOSIS — Z981 Arthrodesis status: Secondary | ICD-10-CM | POA: Diagnosis not present

## 2019-02-18 HISTORY — PX: LUMBAR LAMINECTOMY/ DECOMPRESSION WITH MET-RX: SHX5959

## 2019-02-18 LAB — GLUCOSE, CAPILLARY
Glucose-Capillary: 198 mg/dL — ABNORMAL HIGH (ref 70–99)
Glucose-Capillary: 206 mg/dL — ABNORMAL HIGH (ref 70–99)

## 2019-02-18 LAB — ABO/RH: ABO/RH(D): O POS

## 2019-02-18 SURGERY — LUMBAR LAMINECTOMY/ DECOMPRESSION WITH MET-RX
Anesthesia: General

## 2019-02-18 MED ORDER — ROCURONIUM BROMIDE 50 MG/5ML IV SOLN
INTRAVENOUS | Status: AC
  Filled 2019-02-18: qty 1

## 2019-02-18 MED ORDER — SUCCINYLCHOLINE CHLORIDE 20 MG/ML IJ SOLN
INTRAMUSCULAR | Status: DC | PRN
  Administered 2019-02-18: 100 mg via INTRAVENOUS

## 2019-02-18 MED ORDER — OXYCODONE HCL 5 MG PO TABS: 5.0000 mg | ORAL_TABLET | ORAL | 0 refills | Status: DC | PRN

## 2019-02-18 MED ORDER — PROPOFOL 10 MG/ML IV BOLUS
INTRAVENOUS | Status: DC | PRN
  Administered 2019-02-18: 150 mg via INTRAVENOUS

## 2019-02-18 MED ORDER — PHENYLEPHRINE HCL (PRESSORS) 10 MG/ML IV SOLN
INTRAVENOUS | Status: DC | PRN
  Administered 2019-02-18 (×2): 100 ug via INTRAVENOUS
  Administered 2019-02-18: 200 ug via INTRAVENOUS
  Administered 2019-02-18: 100 ug via INTRAVENOUS

## 2019-02-18 MED ORDER — ROCURONIUM BROMIDE 100 MG/10ML IV SOLN
INTRAVENOUS | Status: DC | PRN
  Administered 2019-02-18: 15 mg via INTRAVENOUS

## 2019-02-18 MED ORDER — SODIUM CHLORIDE 0.9 % IV SOLN: INTRAVENOUS | Status: DC

## 2019-02-18 MED ORDER — CEFAZOLIN SODIUM-DEXTROSE 2-4 GM/100ML-% IV SOLN
INTRAVENOUS | Status: AC
  Filled 2019-02-18: qty 100

## 2019-02-18 MED ORDER — ONDANSETRON HCL 4 MG/2ML IJ SOLN
INTRAMUSCULAR | Status: AC
  Filled 2019-02-18: qty 2

## 2019-02-18 MED ORDER — ACETAMINOPHEN 10 MG/ML IV SOLN: 1000.0000 mg | Freq: Once | INTRAVENOUS | Status: DC | PRN

## 2019-02-18 MED ORDER — FENTANYL CITRATE (PF) 100 MCG/2ML IJ SOLN: 25.0000 ug | INTRAMUSCULAR | Status: DC | PRN

## 2019-02-18 MED ORDER — METHYLPREDNISOLONE ACETATE 40 MG/ML IJ SUSP
INTRAMUSCULAR | Status: DC | PRN
  Administered 2019-02-18: 40 mg

## 2019-02-18 MED ORDER — SUCCINYLCHOLINE CHLORIDE 20 MG/ML IJ SOLN
INTRAMUSCULAR | Status: AC
  Filled 2019-02-18: qty 1

## 2019-02-18 MED ORDER — EPHEDRINE SULFATE 50 MG/ML IJ SOLN
INTRAMUSCULAR | Status: AC
  Filled 2019-02-18: qty 1

## 2019-02-18 MED ORDER — THROMBIN 5000 UNITS EX SOLR
CUTANEOUS | Status: DC | PRN
  Administered 2019-02-18: 5000 [IU] via TOPICAL

## 2019-02-18 MED ORDER — SODIUM CHLORIDE 0.9 % IV SOLN
INTRAVENOUS | Status: DC | PRN
  Administered 2019-02-18: 40 mL

## 2019-02-18 MED ORDER — LABETALOL HCL 5 MG/ML IV SOLN
INTRAVENOUS | Status: AC
  Administered 2019-02-18: 5 mg via INTRAVENOUS
  Filled 2019-02-18: qty 4

## 2019-02-18 MED ORDER — LABETALOL HCL 5 MG/ML IV SOLN
5.0000 mg | Freq: Once | INTRAVENOUS | Status: AC
  Administered 2019-02-18: 11:00:00 5 mg via INTRAVENOUS

## 2019-02-18 MED ORDER — LABETALOL HCL 5 MG/ML IV SOLN: 5.0000 mg | Freq: Once | INTRAVENOUS | Status: AC

## 2019-02-18 MED ORDER — EPHEDRINE SULFATE 50 MG/ML IJ SOLN
INTRAMUSCULAR | Status: DC | PRN
  Administered 2019-02-18 (×4): 10 mg via INTRAVENOUS
  Administered 2019-02-18 (×2): 5 mg via INTRAVENOUS

## 2019-02-18 MED ORDER — DEXAMETHASONE SODIUM PHOSPHATE 10 MG/ML IJ SOLN
INTRAMUSCULAR | Status: DC | PRN
  Administered 2019-02-18: 10 mg via INTRAVENOUS

## 2019-02-18 MED ORDER — FENTANYL CITRATE (PF) 100 MCG/2ML IJ SOLN
INTRAMUSCULAR | Status: AC
  Filled 2019-02-18: qty 2

## 2019-02-18 MED ORDER — BUPIVACAINE-EPINEPHRINE (PF) 0.5% -1:200000 IJ SOLN
INTRAMUSCULAR | Status: DC | PRN
  Administered 2019-02-18: 3 mL

## 2019-02-18 MED ORDER — ONDANSETRON HCL 4 MG/2ML IJ SOLN: 4.0000 mg | Freq: Once | INTRAMUSCULAR | Status: DC | PRN

## 2019-02-18 MED ORDER — DEXAMETHASONE SODIUM PHOSPHATE 10 MG/ML IJ SOLN
INTRAMUSCULAR | Status: AC
  Filled 2019-02-18: qty 1

## 2019-02-18 MED ORDER — ONDANSETRON HCL 4 MG/2ML IJ SOLN
INTRAMUSCULAR | Status: DC | PRN
  Administered 2019-02-18: 4 mg via INTRAVENOUS

## 2019-02-18 MED ORDER — SODIUM CHLORIDE 0.9 % IR SOLN
Status: DC | PRN
  Administered 2019-02-18: 08:00:00 1000 mL

## 2019-02-18 MED ORDER — CEFAZOLIN SODIUM-DEXTROSE 2-4 GM/100ML-% IV SOLN
2.0000 g | INTRAVENOUS | Status: AC
  Administered 2019-02-18: 2 g via INTRAVENOUS

## 2019-02-18 MED ORDER — PROPOFOL 10 MG/ML IV BOLUS
INTRAVENOUS | Status: AC
  Filled 2019-02-18: qty 40

## 2019-02-18 MED ORDER — SUGAMMADEX SODIUM 200 MG/2ML IV SOLN
INTRAVENOUS | Status: DC | PRN
  Administered 2019-02-18: 200 mg via INTRAVENOUS

## 2019-02-18 MED ORDER — METHOCARBAMOL 500 MG PO TABS: 500.0000 mg | ORAL_TABLET | Freq: Four times a day (QID) | ORAL | 0 refills | Status: DC | PRN

## 2019-02-18 MED ORDER — SUGAMMADEX SODIUM 500 MG/5ML IV SOLN
INTRAVENOUS | Status: AC
  Filled 2019-02-18: qty 5

## 2019-02-18 MED ORDER — FAMOTIDINE 20 MG PO TABS: 20.0000 mg | ORAL_TABLET | Freq: Once | ORAL | Status: AC

## 2019-02-18 MED ORDER — BUPIVACAINE HCL 0.5 % IJ SOLN
INTRAMUSCULAR | Status: DC | PRN
  Administered 2019-02-18: 20 mL

## 2019-02-18 MED ORDER — ACETAMINOPHEN 10 MG/ML IV SOLN
INTRAVENOUS | Status: AC
  Filled 2019-02-18: qty 100

## 2019-02-18 MED ORDER — TRAMADOL HCL 50 MG PO TABS: 50.0000 mg | ORAL_TABLET | Freq: Four times a day (QID) | ORAL | 0 refills | Status: AC | PRN

## 2019-02-18 MED ORDER — LIDOCAINE HCL (CARDIAC) PF 100 MG/5ML IV SOSY
PREFILLED_SYRINGE | INTRAVENOUS | Status: DC | PRN
  Administered 2019-02-18: 100 mg via INTRAVENOUS

## 2019-02-18 MED ORDER — MIDAZOLAM HCL 2 MG/2ML IJ SOLN
INTRAMUSCULAR | Status: AC
  Filled 2019-02-18: qty 2

## 2019-02-18 MED ORDER — LIDOCAINE HCL (PF) 2 % IJ SOLN
INTRAMUSCULAR | Status: AC
  Filled 2019-02-18: qty 10

## 2019-02-18 MED ORDER — ACETAMINOPHEN 10 MG/ML IV SOLN
INTRAVENOUS | Status: DC | PRN
  Administered 2019-02-18: 1000 mg via INTRAVENOUS

## 2019-02-18 MED ORDER — FENTANYL CITRATE (PF) 100 MCG/2ML IJ SOLN
INTRAMUSCULAR | Status: DC | PRN
  Administered 2019-02-18: 100 ug via INTRAVENOUS

## 2019-02-18 MED ORDER — HYDROMORPHONE HCL 2 MG PO TABS: 2.0000 mg | ORAL_TABLET | ORAL | Status: DC | PRN

## 2019-02-18 MED ORDER — GLYCOPYRROLATE 0.2 MG/ML IJ SOLN
INTRAMUSCULAR | Status: AC
  Filled 2019-02-18: qty 1

## 2019-02-18 MED ORDER — FAMOTIDINE 20 MG PO TABS
ORAL_TABLET | ORAL | Status: AC
  Administered 2019-02-18: 20 mg via ORAL
  Filled 2019-02-18: qty 1

## 2019-02-18 MED ORDER — PHENYLEPHRINE HCL (PRESSORS) 10 MG/ML IV SOLN
INTRAVENOUS | Status: AC
  Filled 2019-02-18: qty 1

## 2019-02-18 MED ORDER — MIDAZOLAM HCL 2 MG/2ML IJ SOLN
INTRAMUSCULAR | Status: DC | PRN
  Administered 2019-02-18: 1 mg via INTRAVENOUS

## 2019-02-18 SURGICAL SUPPLY — 59 items
BUR NEURO DRILL SOFT 3.0X3.8M (BURR) ×3 IMPLANT
CANISTER SUCT 1200ML W/VALVE (MISCELLANEOUS) ×6 IMPLANT
CHLORAPREP W/TINT 26 (MISCELLANEOUS) ×6 IMPLANT
CNTNR SPEC 2.5X3XGRAD LEK (MISCELLANEOUS) ×1
CONT SPEC 4OZ STER OR WHT (MISCELLANEOUS) ×2
CONTAINER SPEC 2.5X3XGRAD LEK (MISCELLANEOUS) ×1 IMPLANT
COUNTER NEEDLE 20/40 LG (NEEDLE) ×3 IMPLANT
COVER LIGHT HANDLE STERIS (MISCELLANEOUS) ×6 IMPLANT
COVER WAND RF STERILE (DRAPES) ×3 IMPLANT
CUP MEDICINE 2OZ PLAST GRAD ST (MISCELLANEOUS) ×6 IMPLANT
DERMABOND ADVANCED (GAUZE/BANDAGES/DRESSINGS) ×2
DERMABOND ADVANCED .7 DNX12 (GAUZE/BANDAGES/DRESSINGS) ×1 IMPLANT
DRAPE C-ARM 42X72 X-RAY (DRAPES) ×6 IMPLANT
DRAPE LAPAROTOMY 100X77 ABD (DRAPES) ×3 IMPLANT
DRAPE MICROSCOPE SPINE 48X150 (DRAPES) ×3 IMPLANT
DRAPE POUCH INSTRU U-SHP 10X18 (DRAPES) ×3 IMPLANT
DRAPE SURG 17X11 SM STRL (DRAPES) ×12 IMPLANT
ELECT CAUTERY BLADE TIP 2.5 (TIP) ×3
ELECT EZSTD 165MM 6.5IN (MISCELLANEOUS)
ELECT REM PT RETURN 9FT ADLT (ELECTROSURGICAL) ×3
ELECTRODE CAUTERY BLDE TIP 2.5 (TIP) ×1 IMPLANT
ELECTRODE EZSTD 165MM 6.5IN (MISCELLANEOUS) IMPLANT
ELECTRODE REM PT RTRN 9FT ADLT (ELECTROSURGICAL) ×1 IMPLANT
FRAME EYE SHIELD (PROTECTIVE WEAR) ×6 IMPLANT
GLOVE BIOGEL PI IND STRL 7.0 (GLOVE) ×1 IMPLANT
GLOVE BIOGEL PI INDICATOR 7.0 (GLOVE) ×2
GLOVE SURG SYN 7.0 (GLOVE) ×6 IMPLANT
GLOVE SURG SYN 7.0 PF PI (GLOVE) ×2 IMPLANT
GLOVE SURG SYN 8.5  E (GLOVE) ×6
GLOVE SURG SYN 8.5 E (GLOVE) ×3 IMPLANT
GLOVE SURG SYN 8.5 PF PI (GLOVE) ×3 IMPLANT
GOWN SRG XL LVL 3 NONREINFORCE (GOWNS) ×1 IMPLANT
GOWN STRL NON-REIN TWL XL LVL3 (GOWNS) ×2
GOWN STRL REUS W/TWL MED LVL3 (GOWN DISPOSABLE) ×3 IMPLANT
GRADUATE 1200CC STRL 31836 (MISCELLANEOUS) ×3 IMPLANT
KIT SPINAL PRONEVIEW (KITS) ×3 IMPLANT
KNIFE BAYONET SHORT DISCETOMY (MISCELLANEOUS) IMPLANT
MARKER SKIN DUAL TIP RULER LAB (MISCELLANEOUS) ×3 IMPLANT
NDL SAFETY ECLIPSE 18X1.5 (NEEDLE) ×1 IMPLANT
NEEDLE HYPO 18GX1.5 SHARP (NEEDLE) ×2
NEEDLE HYPO 22GX1.5 SAFETY (NEEDLE) ×3 IMPLANT
NS IRRIG 1000ML POUR BTL (IV SOLUTION) ×3 IMPLANT
PACK LAMINECTOMY NEURO (CUSTOM PROCEDURE TRAY) ×3 IMPLANT
PAD ARMBOARD 7.5X6 YLW CONV (MISCELLANEOUS) ×3 IMPLANT
SPOGE SURGIFLO 8M (HEMOSTASIS) ×2
SPONGE SURGIFLO 8M (HEMOSTASIS) ×1 IMPLANT
SUT DVC VLOC 3-0 CL 6 P-12 (SUTURE) ×3 IMPLANT
SUT VIC AB 0 CT1 27 (SUTURE) ×2
SUT VIC AB 0 CT1 27XCR 8 STRN (SUTURE) ×1 IMPLANT
SUT VIC AB 2-0 CT1 18 (SUTURE) ×3 IMPLANT
SYR 10ML LL (SYRINGE) ×3 IMPLANT
SYR 20ML LL LF (SYRINGE) ×3 IMPLANT
SYR 30ML LL (SYRINGE) ×6 IMPLANT
SYR 3ML LL SCALE MARK (SYRINGE) ×3 IMPLANT
TOWEL OR 17X26 4PK STRL BLUE (TOWEL DISPOSABLE) ×9 IMPLANT
TUBE MATRX SPINL 18MM 6CM DISP (INSTRUMENTS) ×2
TUBE METRX SPINAL 18X6 DISP (INSTRUMENTS) IMPLANT
TUBING CONNECTING 10 (TUBING) ×2 IMPLANT
TUBING CONNECTING 10' (TUBING) ×1

## 2019-02-18 NOTE — Anesthesia Postprocedure Evaluation (Signed)
Anesthesia Post Note  Patient: Michael Humphrey  Procedure(s) Performed: L4-5 DECOMPRESSION (N/A )  Patient location during evaluation: PACU Anesthesia Type: General Level of consciousness: awake and alert Pain management: pain level controlled Vital Signs Assessment: post-procedure vital signs reviewed and stable Respiratory status: spontaneous breathing, nonlabored ventilation, respiratory function stable and patient connected to nasal cannula oxygen Cardiovascular status: blood pressure returned to baseline and stable Postop Assessment: no apparent nausea or vomiting Anesthetic complications: no     Last Vitals:  Vitals:   02/18/19 1116 02/18/19 1123  BP: (!) 158/89 (!) 165/106  Pulse: 63 66  Resp: 13 18  Temp: (!) 36.3 C (!) 36.2 C  SpO2: 95% 96%    Last Pain:  Vitals:   02/18/19 1123  TempSrc:   PainSc: 0-No pain                 Arita Miss

## 2019-02-18 NOTE — H&P (Signed)
I have reviewed and confirmed my history and physical from 01/21/2019 with no additions or changes. Plan for L4-5 decompression.  Risks and benefits reviewed.  Heart sounds normal no MRG. Chest Clear to Auscultation Bilaterally.

## 2019-02-18 NOTE — Progress Notes (Signed)
Blood sugar 206   Dr Bertell Maria called  No new orders

## 2019-02-18 NOTE — Progress Notes (Signed)
labetalol 5mg  given for blood pressure 174/101

## 2019-02-18 NOTE — Transfer of Care (Signed)
Immediate Anesthesia Transfer of Care Note  Patient: Michael Humphrey  Procedure(s) Performed: L4-5 DECOMPRESSION (N/A )  Patient Location: PACU  Anesthesia Type:General  Level of Consciousness: sedated  Airway & Oxygen Therapy: Patient Spontanous Breathing and Patient connected to face mask oxygen  Post-op Assessment: Report given to RN and Post -op Vital signs reviewed and stable  Post vital signs: Reviewed  Last Vitals:  Vitals Value Taken Time  BP 160/104 02/18/19 1012  Temp    Pulse 69 02/18/19 1013  Resp 10 02/18/19 1013  SpO2 99 % 02/18/19 1013  Vitals shown include unvalidated device data.  Last Pain:  Vitals:   02/18/19 [id number]  TempSrc: Tympanic  PainSc: 0-No pain         Complications: No apparent anesthesia complications

## 2019-02-18 NOTE — Progress Notes (Signed)
Dr Bertell Maria aware of pts BP still up amd pt aware that he needs to take BP meds when he gets home

## 2019-02-18 NOTE — Anesthesia Procedure Notes (Signed)
Procedure Name: Intubation Date/Time: 02/18/2019 8:20 AM Performed by: Rolla Plate, CRNA Pre-anesthesia Checklist: Patient identified, Patient being monitored, Timeout performed, Emergency Drugs available and Suction available Patient Re-evaluated:Patient Re-evaluated prior to induction Oxygen Delivery Method: Circle system utilized Preoxygenation: Pre-oxygenation with 100% oxygen Induction Type: IV induction Ventilation: Mask ventilation without difficulty Laryngoscope Size: 3, McGraph and 4 Grade View: Grade I Tube type: Oral Tube size: 7.5 mm Number of attempts: 1 Airway Equipment and Method: Stylet Placement Confirmation: ETT inserted through vocal cords under direct vision,  positive ETCO2 and breath sounds checked- equal and bilateral Secured at: 22 cm Tube secured with: Tape Dental Injury: Teeth and Oropharynx as per pre-operative assessment

## 2019-02-18 NOTE — Progress Notes (Signed)
Pt able to ambulate from room to bathroom and back

## 2019-02-18 NOTE — Progress Notes (Signed)
Another dose of labetalol given for blood pressure 165/113

## 2019-02-18 NOTE — Discharge Instructions (Addendum)
AMBULATORY SURGERY  DISCHARGE INSTRUCTIONS   1) The drugs that you were given will stay in your system until tomorrow so for the next 24 hours you should not:  A) Drive an automobile B) Make any legal decisions C) Drink any alcoholic beverage   2) You may resume regular meals tomorrow.  Today it is better to start with liquids and gradually work up to solid foods.  You may eat anything you prefer, but it is better to start with liquids, then soup and crackers, and gradually work up to solid foods.   3) Please notify your doctor immediately if you have any unusual bleeding, trouble breathing, redness and pain at the surgery site, drainage, fever, or pain not relieved by medication.    4) Additional Instructions:        Please contact your physician with any problems or Same Day Surgery at [phone], Monday through Friday 6 am to 4 pm, or [place] at [facility] number at [phone]. Your surgeon has performed an operation on your lumbar spine (low back) to relieve pressure on one or more nerves. Many times, patients feel better immediately after surgery and can "overdo it." Even if you feel well, it is important that you follow these activity guidelines. If you do not let your back heal properly from the surgery, you can increase the chance of a disc herniation and/or return of your symptoms. The following are instructions to help in your recovery once you have been discharged from the hospital.  * Do not take anti-inflammatory medications for 3 days after surgery (naproxen [Aleve], ibuprofen [Advil, Motrin], celecoxib [Celebrex], etc.) * You may resume aspirin after 7 days and Eliquis after 14 days  Activity    No bending, lifting, or twisting ("BLT"). Avoid lifting objects heavier than 10 pounds (gallon milk jug).  Where possible, avoid household activities that involve lifting, bending, pushing, or pulling such as laundry, vacuuming, grocery shopping, and childcare.  Try to arrange for help from friends and family for these activities while your back heals.  Increase physical activity slowly as tolerated.  Taking short walks is encouraged, but avoid strenuous exercise. Do not jog, run, bicycle, lift weights, or participate in any other exercises unless specifically allowed by your doctor. Avoid prolonged sitting, including car rides.  Talk to your doctor before resuming sexual activity.  You should not drive until cleared by your doctor.  Until released by your doctor, you should not return to work or school.  You should rest at home and let your body heal.   You may shower two days after your surgery.  After showering, lightly dab your incision dry. Do not take a tub bath or go swimming for 3 weeks, or until approved by your doctor at your follow-up appointment.  If you smoke, we strongly recommend that you quit.  Smoking has been proven to interfere with normal healing in your back and will dramatically reduce the success rate of your surgery. Please contact [facility] (800-QUIT-NOW) and use the resources at www.[facility].com for assistance in stopping smoking.  Surgical Incision   If you have a dressing on your incision, you may remove it three days after your surgery. Keep your incision area clean and dry.  If you have staples or stitches on your incision, you should have a follow up scheduled for removal. If you do not have staples or stitches, you will have steri-strips (small pieces of surgical tape) or Dermabond glue. The steri-strips/glue should begin to peel away  within about a week (it is fine if the steri-strips fall off before then). If the strips are still in place one week after your surgery, you may gently remove them.  Diet            You may return to your usual diet. Be sure to stay hydrated.  When to Contact us  Although your surgery and recovery will likely be uneventful, you may have some residual numbness, aches, and pains in  your back and/or legs. This is normal and should improve in the next few weeks.  However, should you experience any of the following, contact us immediately: . New numbness or weakness . Pain that is progressively getting worse, and is not relieved by your pain medications or rest . Bleeding, redness, swelling, pain, or drainage from surgical incision . Chills or flu-like symptoms . Fever greater than 101.0 F (38.3 C) . Problems with bowel or bladder functions . Difficulty breathing or shortness of breath . Warmth, tenderness, or swelling in your calf  Contact Information . During office hours (Monday-Friday 9 am to 5 pm), please call your physician at [phone] . After hours and weekends, please call [phone] and an answering service will put you in touch with either Dr. Lacinda Axon or Dr. Izora Ribas.  . For a life-threatening emergency, call [place]   5) The drugs that you were given will stay in your system until tomorrow so for the next 24 hours you should not:  D) Drive an automobile E) Make any legal decisions F) Drink any alcoholic beverage   6) You may resume regular meals tomorrow.  Today it is better to start with liquids and gradually work up to solid foods.  You may eat anything you prefer, but it is better to start with liquids, then soup and crackers, and gradually work up to solid foods.   7) Please notify your doctor immediately if you have any unusual bleeding, trouble breathing, redness and pain at the surgery site, drainage, fever, or pain not relieved by medication.    8) Additional Instructions:        Please contact your physician with any problems or Same Day Surgery at [phone], Monday through Friday 6 am to 4 pm, or [place] at [facility] number at [phone].

## 2019-02-18 NOTE — Op Note (Signed)
Indications: Michael Humphrey is a 69 yo male who presented with left leg pain due to neurogenic claudication from lumbar stenosis.  Findings: overgrowth of ligamentum flavum  Preoperative Diagnosis: Lumbar Stenosis with neurogenic claudication Postoperative Diagnosis: same   EBL: 10 ml IVF: 600 ml Drains: none Disposition: Extubated and Stable to PACU Complications: none  No foley catheter was placed.   Preoperative Note:   Risks of surgery discussed include: infection, bleeding, stroke, coma, death, paralysis, CSF leak, nerve/spinal cord injury, numbness, tingling, weakness, complex regional pain syndrome, recurrent stenosis and/or disc herniation, vascular injury, development of instability, neck/back pain, need for further surgery, persistent symptoms, development of deformity, and the risks of anesthesia. The patient understood these risks and agreed to proceed.  Operative Note:   1. L4-5 lumbar decompression including central laminectomy and bilateral medial facetectomies including foraminotomies  The patient was then brought from the preoperative center with intravenous access established.  The patient underwent general anesthesia and endotracheal tube intubation, and was then rotated on the [place] rail top where all pressure points were appropriately padded.  The skin was then thoroughly cleansed.  Perioperative antibiotic prophylaxis was administered.  Sterile prep and drapes were then applied and a timeout was then observed.  C-arm was brought into the field under sterile conditions and under lateral visualization the L4-5 interspace was identified and marked.  The incision was marked on the left and injected with local anesthetic. Once this was complete a 2 cm incision was opened with the use of a #10 blade knife.    The metrx tubes were sequentially advanced and confirmed in position at L4-5. An 77mm by 74mm tube was locked in place to the bed side attachment.  The microscope was  then sterilely brought into the field and muscle creep was hemostased with a bipolar and resected with a pituitary rongeur.  A Bovie extender was then used to expose the spinous process and lamina.  Careful attention was placed to not violate the facet capsule. A 3 mm matchstick drill bit was then used to make a hemi-laminotomy trough until the ligamentum flavum was exposed.  This was extended to the base of the spinous process and to the contralateral side to remove all the central bone from each side.  Once this was complete and the underlying ligamentum flavum was visualized, it was dissected with a curette and resected with Kerrison rongeurs.  Extensive ligamentum hypertrophy was noted, requiring a substantial amount of time and care for removal.  The dura was identified and palpated. The kerrison rongeur was then used to remove the medial facet bilaterally until no compression was noted.  A balltip probe was used to confirm decompression of the ipsilateral L5 nerve root.  Once this was complete, L4-5 central decompression including medial facetectomy and foraminotomy was confirmed and decompression on both sides was confirmed. No CSF leak was noted.  A Depo-Medrol soaked Gelfoam pledget was placed in the defect.  The wound was copiously irrigated. The tube system was then removed under microscopic visualization and hemostasis was obtained with a bipolar.    The fascial layer was reapproximated with the use of a 0 Vicryl suture.  Subcutaneous tissue layer was reapproximated using 2-0 Vicryl suture.  3-0 monocryl was placed in subcuticular fashion. The skin was then cleansed and Dermabond was used to close the skin opening.  Patient was then rotated back to the preoperative bed awakened from anesthesia and taken to recovery all counts are correct in this case.  I performed  the entire procedure with the assistance of Marin Olp PA as an Pensions consultant.  Zaccary Creech K. Izora Ribas MD

## 2019-02-18 NOTE — Discharge Summary (Signed)
Procedure: L4-5 lumbar decompression  Procedure date: 02/18/2019 Diagnosis: Lumbar radiculopathy   History: Michael Humphrey is s/p L4-5 lumbar decompression for lumbar raduiculopathy  POD0: Tolerated procedure well. Evaluated in post op recovery still disoriented from anesthesia but able to answer questions and obey commands.   Physical Exam: Vitals:   02/18/19 0712 02/18/19 1012  BP: (!) 156/81 (!) 160/104  Pulse: 69 72  Resp: 18 14  Temp: 97.8 F (36.6 C) 98 F (36.7 C)  SpO2: 99% 98%   Strength:5/5 through right lower extremity, 4/5 hamstring and quad (knee pain limited - present prior to surgery), otherwise 5/5  Sensation: intact and symmetric throughout lower extremities Skin: glue intact  Data:  No results for input(s): NA, K, CL, CO2, BUN, CREATININE, LABGLOM, GLUCOSE, CALCIUM in the last 168 hours. No results for input(s): AST, ALT, ALKPHOS in the last 168 hours.  Invalid input(s): TBILI   No results for input(s): WBC, HGB, HCT, PLT in the last 168 hours. No results for input(s): APTT, INR in the last 168 hours.       Other tests/results: No imagine reviewed  Assessment/Plan:  Michael Humphrey is POD0 s/p L4-5 decompression for left lumbar radiculopathy. Will continue post op pain tramadol.  He is scheduled to follow up in approximately 2 weeks to monitor progress.   Marin Olp PA-C Department of Neurosurgery

## 2019-02-18 NOTE — Anesthesia Preprocedure Evaluation (Addendum)
Anesthesia Evaluation  Patient identified by MRN, date of birth, ID band Patient awake    Reviewed: Allergy & Precautions, NPO status , Patient's Chart, lab work & pertinent test results  History of Anesthesia Complications Negative for: history of anesthetic complications  Airway Mallampati: II  TM Distance: >3 FB Neck ROM: Full   Comment: Large neck Dental  (+) Edentulous Upper, Edentulous Lower   Pulmonary neg sleep apnea, neg COPD, Patient abstained from smoking.Not current smoker, PE Small PE in 2020 per CT scan, but patient says he does not know about it   Pulmonary exam normal breath sounds clear to auscultation       Cardiovascular Exercise Tolerance: Good METShypertension, Pt. on medications (-) CAD and (-) Past MI (-) dysrhythmias  Rhythm:Regular Rate:Normal - Systolic murmurs TTE [id number]: 1. The left ventricle has low normal systolic function, with an ejection  fraction of 50-55%. The cavity size was normal. Left ventricular diastolic  parameters were normal.  2. The right ventricle has normal systolic function. The cavity was  normal. There is no increase in right ventricular wall thickness.  3. Trivial pericardial effusion is present.  4. The mitral valve is normal in structure.  5. The tricuspid valve is normal in structure.  6. The aortic valve is tricuspid.  7. The pulmonic valve was normal in structure.    Neuro/Psych No residual weakness and numbness, sometimes stumbles on his words CVA, No Residual Symptoms negative psych ROS   GI/Hepatic GERD  ,(+)     (-) substance abuse  ,   Endo/Other  diabetes  Renal/GU negative Renal ROS     Musculoskeletal  (+) Arthritis ,   Abdominal   Peds  Hematology   Anesthesia Other Findings Past Medical History: No date: Arthritis No date: Cancer [facility])     Comment:  SKIN CANCER No date: Diabetes mellitus without complication ([place]) [id number]: DVT (deep  venous thrombosis) ([facility])     Comment:  nonocclusive DVT left internal jugular  No date: GERD (gastroesophageal reflux disease)     Comment:  H/O No date: High cholesterol No date: Hypertension No date: Lumbar radiculitis No date: Stroke [facility])     Comment:  07/2017  Reproductive/Obstetrics                            Anesthesia Physical Anesthesia Plan  ASA: III  Anesthesia Plan: General   Post-op Pain Management:    Induction: Intravenous  PONV Risk Score and Plan: 3 and Ondansetron and Dexamethasone  Airway Management Planned: Oral ETT  Additional Equipment: None  Intra-op Plan:   Post-operative Plan: Extubation in OR  Informed Consent: I have reviewed the patients History and Physical, chart, labs and discussed the procedure including the risks, benefits and alternatives for the proposed anesthesia with the patient or authorized representative who has indicated his/her understanding and acceptance.     Dental advisory given  Plan Discussed with: CRNA and Surgeon  Anesthesia Plan Comments: (Discussed risks of anesthesia with patient, including PONV, sore throat, lip/dental damage. Rare risks discussed as well, such as cardiorespiratory sequelae. Patient understands.)        Anesthesia Quick Evaluation

## 2019-02-20 DIAGNOSIS — Z96643 Presence of artificial hip joint, bilateral: Secondary | ICD-10-CM | POA: Diagnosis not present

## 2019-02-20 DIAGNOSIS — E11319 Type 2 diabetes mellitus with unspecified diabetic retinopathy without macular edema: Secondary | ICD-10-CM | POA: Diagnosis not present

## 2019-02-20 DIAGNOSIS — Z4789 Encounter for other orthopedic aftercare: Secondary | ICD-10-CM | POA: Diagnosis not present

## 2019-02-20 DIAGNOSIS — Z7984 Long term (current) use of oral hypoglycemic drugs: Secondary | ICD-10-CM | POA: Diagnosis not present

## 2019-02-20 DIAGNOSIS — I10 Essential (primary) hypertension: Secondary | ICD-10-CM | POA: Diagnosis not present

## 2019-02-20 DIAGNOSIS — Z96653 Presence of artificial knee joint, bilateral: Secondary | ICD-10-CM | POA: Diagnosis not present

## 2019-02-20 DIAGNOSIS — Z7901 Long term (current) use of anticoagulants: Secondary | ICD-10-CM | POA: Diagnosis not present

## 2019-02-20 DIAGNOSIS — Z86711 Personal history of pulmonary embolism: Secondary | ICD-10-CM | POA: Diagnosis not present

## 2019-02-26 DIAGNOSIS — M25552 Pain in left hip: Secondary | ICD-10-CM | POA: Diagnosis not present

## 2019-02-26 DIAGNOSIS — M25551 Pain in right hip: Secondary | ICD-10-CM | POA: Diagnosis not present

## 2019-02-26 DIAGNOSIS — Z96653 Presence of artificial knee joint, bilateral: Secondary | ICD-10-CM | POA: Diagnosis not present

## 2019-02-26 DIAGNOSIS — M170 Bilateral primary osteoarthritis of knee: Secondary | ICD-10-CM | POA: Diagnosis not present

## 2019-02-26 DIAGNOSIS — Z96643 Presence of artificial hip joint, bilateral: Secondary | ICD-10-CM | POA: Diagnosis not present

## 2019-03-11 ENCOUNTER — Ambulatory Visit: Payer: PPO | Attending: Internal Medicine

## 2019-03-11 DIAGNOSIS — Z23 Encounter for immunization: Secondary | ICD-10-CM | POA: Insufficient documentation

## 2019-03-11 NOTE — Progress Notes (Signed)
   Covid-19 Vaccination Clinic  Name:  Michael Humphrey    MRN: [id number] DOB: [birth year]  03/11/2019  [name] was observed post Covid-19 immunization for 15 minutes without incident. He was provided with Vaccine Information Sheet and instruction to access the V-Safe system.   [name] was instructed to call 911 with any severe reactions post vaccine: Marland Kitchen Difficulty breathing  . Swelling of face and throat  . A fast heartbeat  . A bad rash all over body  . Dizziness and weakness   Immunizations Administered    Name Date Dose VIS Date Route   Moderna COVID-19 Vaccine 03/11/2019 12:44 PM 0.5 mL 12/02/2018 Intramuscular   Manufacturer: Moderna   Lot: [id number]   [id number]

## 2019-03-13 ENCOUNTER — Other Ambulatory Visit: Payer: PPO

## 2019-03-16 ENCOUNTER — Other Ambulatory Visit: Payer: PPO

## 2019-04-06 DIAGNOSIS — M25562 Pain in left knee: Secondary | ICD-10-CM | POA: Diagnosis not present

## 2019-04-06 DIAGNOSIS — M25462 Effusion, left knee: Secondary | ICD-10-CM | POA: Diagnosis not present

## 2019-05-26 ENCOUNTER — Other Ambulatory Visit: Payer: Self-pay

## 2019-05-26 ENCOUNTER — Encounter (INDEPENDENT_AMBULATORY_CARE_PROVIDER_SITE_OTHER): Payer: Self-pay | Admitting: Ophthalmology

## 2019-05-26 ENCOUNTER — Ambulatory Visit (INDEPENDENT_AMBULATORY_CARE_PROVIDER_SITE_OTHER): Payer: PPO | Admitting: Ophthalmology

## 2019-05-26 DIAGNOSIS — E113412 Type 2 diabetes mellitus with severe nonproliferative diabetic retinopathy with macular edema, left eye: Secondary | ICD-10-CM | POA: Diagnosis not present

## 2019-05-26 DIAGNOSIS — E113411 Type 2 diabetes mellitus with severe nonproliferative diabetic retinopathy with macular edema, right eye: Secondary | ICD-10-CM | POA: Insufficient documentation

## 2019-05-26 MED ORDER — BEVACIZUMAB CHEMO INJECTION 1.25MG/0.05ML SYRINGE FOR KALEIDOSCOPE
1.2500 mg | INTRAVITREAL | Status: AC | PRN
  Administered 2019-05-26: 1.25 mg via INTRAVITREAL

## 2019-05-26 NOTE — Progress Notes (Addendum)
05/26/2019     CHIEF COMPLAINT Patient presents for Retina Evaluation   HISTORY OF PRESENT ILLNESS: Michael Humphrey is a 69 y.o. male who presents to the clinic today for:   HPI    Retina Evaluation    In both eyes.  This started 2 months ago.  Associated Symptoms Floaters and Flashes.  Context:  distance vision and near vision.  Treatments tried include no treatments.  Response to treatment was no improvement.          Comments    Referred by [facility] for diabetic retinopathy Patient states that he periodically sees floaters. Patient states that he also has blurry vision. BGL: 120 A1C: 6.9       Last edited by Tilda Franco on 05/26/2019  8:29 AM. (History)      Referring physician: Kirk Ruths, MD [facility] [place],  [place] [address]  HISTORICAL INFORMATION:   Selected notes from the MEDICAL RECORD NUMBER    Lab Results  Component Value Date   HGBA1C 7.1 (H) 07/15/2017     CURRENT MEDICATIONS: No current outpatient medications on file. (Ophthalmic Drugs)   No current facility-administered medications for this visit. (Ophthalmic Drugs)   Current Outpatient Medications (Other)  Medication Sig  . acetaminophen (TYLENOL) 500 MG tablet Take 1,000 mg by mouth every 6 (six) hours as needed for moderate pain or headache.  . carvedilol (COREG) 12.5 MG tablet Take 12.5 mg by mouth 2 (two) times daily.   . finasteride (PROSCAR) 5 MG tablet Take 5 mg by mouth every morning.   . gabapentin (NEURONTIN) 600 MG tablet Take 600 mg by mouth 2 (two) times daily. 1 TAB IN AM AND 2 AT NIGHT  . glimepiride (AMARYL) 2 MG tablet Take 2 mg by mouth daily with breakfast.  . lisinopril (PRINIVIL,ZESTRIL) 2.5 MG tablet Take 1 tablet (2.5 mg total) by mouth daily. (Patient taking differently: Take 2.5 mg by mouth every morning. )  . metFORMIN (GLUCOPHAGE) 500 MG tablet Take 500 mg by mouth 2 (two) times daily.  . methocarbamol (ROBAXIN)  500 MG tablet Take 1 tablet (500 mg total) by mouth every 6 (six) hours as needed for muscle spasms.  Marland Kitchen oxyCODONE (ROXICODONE) 5 MG immediate release tablet Take 1 tablet (5 mg total) by mouth every 4 (four) hours as needed for breakthrough pain.  Marland Kitchen torsemide (DEMADEX) 20 MG tablet Take 20 mg by mouth daily as needed (swelling).   . traZODone (DESYREL) 100 MG tablet Take 1 tablet (100 mg total) by mouth at bedtime as needed for sleep.   No current facility-administered medications for this visit. (Other)      REVIEW OF SYSTEMS:    ALLERGIES Allergies  Allergen Reactions  . Oxycodone     hallucinations  . Clonidine Derivatives     Hallucinations   . Fioricet [Butalbital-Apap-Caffeine]     hallucination  . Hydrocodone-Acetaminophen     hallucinations  . Mobic [Meloxicam]     hallucinations  . Norvasc [Amlodipine Besylate]     hallucinations  . Statins     Muscle cramps  . Tramadol Itching    PAST MEDICAL HISTORY Past Medical History:  Diagnosis Date  . Arthritis   . Cancer [facility])    SKIN CANCER  . Diabetes mellitus without complication ([place])   . DVT (deep venous thrombosis) ([place]) 07/2017   nonocclusive DVT left internal jugular   . GERD (gastroesophageal reflux disease)  H/O  . High cholesterol   . Hypertension   . Lumbar radiculitis   . Stroke [facility])    07/2017   Past Surgical History:  Procedure Laterality Date  . BUNIONECTOMY    . CATARACT EXTRACTION W/PHACO Left 12/11/2017   Procedure: CATARACT EXTRACTION PHACO AND INTRAOCULAR LENS PLACEMENT ([place])  LEFT DIABETIC;  Surgeon: Leandrew Koyanagi, MD;  Location: [place];  Service: Ophthalmology;  Laterality: Left;  DIABETIC (ACTA picking pt up at 0600, needs 0630 arrival time)  . CATARACT EXTRACTION W/PHACO Right 01/15/2018   Procedure: CATARACT EXTRACTION PHACO AND INTRAOCULAR LENS PLACEMENT ([place])  RIGHT DIABETIC  leave so arrival will be around 7:45 ds;  Surgeon: Leandrew Koyanagi, MD;   Location: [place];  Service: Ophthalmology;  Laterality: Right;  Diabetic - oral meds  . CHOLECYSTECTOMY    . COLONOSCOPY    . COLONOSCOPY WITH PROPOFOL N/A 12/17/2014   Procedure: COLONOSCOPY WITH PROPOFOL;  Surgeon: Lollie Sails, MD;  Location: [facility] ENDOSCOPY;  Service: Endoscopy;  Laterality: N/A;  . correct hammertoe    . JOINT REPLACEMENT    . KNEE ARTHROSCOPY Right   . LUMBAR LAMINECTOMY/ DECOMPRESSION WITH MET-RX N/A 02/18/2019   Procedure: L4-5 DECOMPRESSION;  Surgeon: Meade Maw, MD;  Location: [facility] ORS;  Service: Neurosurgery;  Laterality: N/A;  . TOTAL HIP ARTHROPLASTY    . TOTAL KNEE ARTHROPLASTY Right     FAMILY HISTORY Family History  Problem Relation Age of Onset  . Clotting disorder Father   . Hypertension Father     SOCIAL HISTORY Social History   Tobacco Use  . Smoking status: Never Smoker  . Smokeless tobacco: Former Systems developer    Types: Chew  Substance Use Topics  . Alcohol use: Yes    Comment: RARE  . Drug use: No         OPHTHALMIC EXAM:  Base Eye Exam    Visual Acuity (Snellen - Linear)      Right Left   Dist cc 20/40+2 20/25-2   Dist ph cc 20/30        Tonometry (Tonopen, 8:21 AM)      Right Left   Pressure 16 18       Pupils      Pupils Dark Light Shape React APD   Right PERRL 4 3 Round Brisk None   Left PERRL 4 3 Round Brisk None       Visual Fields (Counting fingers)      Left Right    Full Full       Extraocular Movement      Right Left    Full Full       Neuro/Psych    Oriented x3: Yes   Mood/Affect: Normal       Dilation    Both eyes: 1.0% Mydriacyl, 2.5% Phenylephrine @ 8:21 AM        Slit Lamp and Fundus Exam    External Exam      Right Left   External Normal Normal       Slit Lamp Exam      Right Left   Lids/Lashes Normal Normal   Conjunctiva/Sclera White and quiet White and quiet   Cornea Clear Clear   Anterior Chamber Deep and quiet Deep and quiet   Iris Round and reactive  Round and reactive   Lens Posterior chamber intraocular lens Posterior chamber intraocular lens   Anterior Vitreous Normal Normal       Fundus Exam  Right Left   Posterior Vitreous Normal Normal   Disc Normal Normal   C/D Ratio 0.0 0.0   Macula Severe clinically significant macular edema, Macular thickening, Microaneurysms,, temporally Microaneurysms   Vessels NPDR-Severe NPDR-Severe   Periphery Normal Normal          IMAGING AND PROCEDURES  Imaging and Procedures for 05/26/19  OCT, Retina - OU - Both Eyes       Right Eye Quality was good. Scan locations included subfoveal. Central Foveal Thickness: 351. Findings include abnormal foveal contour.   Left Eye Quality was good. Scan locations included subfoveal. Central Foveal Thickness: 288. Findings include abnormal foveal contour.   Notes Clinically significant macular edema OD, inferotemporal to the foveal region.  OS does have appearance of CSME inferotemporally. Fluorescein angiography OU required.       Fluorescein Angiography Optos (Transit OD)       Right Eye   Early phase findings include leakage, microaneurysm. Mid/Late phase findings include microaneurysm, vascular perfusion defect. Choroidal neovascularization is not present.   Left Eye   Early phase findings include microaneurysm, leakage. Mid/Late phase findings include leakage, window defect, microaneurysm, vascular perfusion defect. Choroidal neovascularization is not present.   Notes Clinically significant macular edema inferotemporal to the macula OD and temporal to the fovea OS.  OS will likely respond nicely with simple focal laser photocoagulation to this region. This is an area of nonperfusion.  OD will likely need intravitreal injection of Avastin initially followed thereafter by focal and possible PRP OU for peripheral nonperfusion       Intravitreal Injection, Pharmacologic Agent - OD - Right Eye       Time Out 05/26/2019.  9:35 AM. Confirmed correct patient, procedure, site, and patient consented.   Anesthesia Topical anesthesia was used. Anesthetic medications included Akten 3.5%.   Procedure Preparation included 10% betadine to eyelids, Tobramycin 0.3%. A 30 gauge needle was used.   Injection:  1.25 mg Bevacizumab (AVASTIN) SOLN   NDC: [address]-4, Lot: [id number]   Route: Intravitreal, Site: Right Eye, Waste: 0 mg  Post-op Post injection exam found visual acuity of at least counting fingers. The patient tolerated the procedure well. There were no complications. The patient received written and verbal post procedure care education. Post injection medications were not given.        Color Fundus Photography Optos - OU - Both Eyes       Right Eye Progression has no prior data. Disc findings include normal observations. Macula : microaneurysms, exudates.   Left Eye Progression has no prior data. Disc findings include normal observations. Macula : microaneurysms.   Notes Clinically significant macular edema temporal to the fovea OD  Severe nonproliferative diabetic retinopathy OU                ASSESSMENT/PLAN:  Severe nonproliferative diabetic retinopathy of left eye, with macular edema, associated with type 2 diabetes mellitus ([place]) OS will schedule focal laser photocoagulation to control the disease near the macula. Injections at this time or not necessary  Severe nonproliferative diabetic retinopathy of right eye, with macular edema, associated with type 2 diabetes mellitus (HCC) OD, will need intravitreal antiveg F, intravitreal Avastin initially followed thereafter by focal laser treatment to a large region inferotemporally of CSME      ICD-10-CM   1. Severe nonproliferative diabetic retinopathy of right eye, with macular edema, associated with type 2 diabetes mellitus (HCC)  E11.3411 OCT, Retina - OU - Both Eyes    Fluorescein Angiography Optos (  Transit OD)    Intravitreal  Injection, Pharmacologic Agent - OD - Right Eye    Bevacizumab (AVASTIN) SOLN 1.25 mg    Color Fundus Photography Optos - OU - Both Eyes  2. Severe nonproliferative diabetic retinopathy of left eye, with macular edema, associated with type 2 diabetes mellitus (HCC)  X32.3557 OCT, Retina - OU - Both Eyes    Color Fundus Photography Optos - OU - Both Eyes    1.  OD, CSME will commence therapy with intravitreal Avastin today.  This was performed uneventfully and without discomfort.  2.  Soon OD will need focal laser treatment in this region once the thickening is controlled  3.  OS, patient to return in 2 to 3 weeks for focal laser treatment only to a small limited area of thickening temporal to the fovea  Ophthalmic Meds Ordered this visit:  Meds ordered this encounter  Medications  . Bevacizumab (AVASTIN) SOLN 1.25 mg       Return in about 2 weeks (around 06/09/2019) for FOCAL, OS.  There are no Patient Instructions on file for this visit.   Explained the diagnoses, plan, and follow up with the patient and they expressed understanding.  Patient expressed understanding of the importance of proper follow up care.   Clent Demark Tishawn Friedhoff M.D. Diseases & Surgery of the Retina and Vitreous Retina & Diabetic [place] 05/26/19     Abbreviations: M myopia (nearsighted); A astigmatism; H hyperopia (farsighted); P presbyopia; Mrx spectacle prescription;  CTL contact lenses; OD right eye; OS left eye; OU both eyes  XT exotropia; ET esotropia; PEK punctate epithelial keratitis; PEE punctate epithelial erosions; DES dry eye syndrome; MGD meibomian gland dysfunction; ATs artificial tears; PFAT's preservative free artificial tears; [place] nuclear sclerotic cataract; PSC posterior subcapsular cataract; ERM epi-retinal membrane; PVD posterior vitreous detachment; RD retinal detachment; DM diabetes mellitus; DR diabetic retinopathy; NPDR non-proliferative diabetic retinopathy; PDR proliferative diabetic  retinopathy; CSME clinically significant macular edema; DME diabetic macular edema; dbh dot blot hemorrhages; CWS cotton wool spot; POAG primary open angle glaucoma; C/D cup-to-disc ratio; HVF humphrey visual field; GVF goldmann visual field; OCT optical coherence tomography; IOP intraocular pressure; BRVO Branch retinal vein occlusion; CRVO central retinal vein occlusion; CRAO central retinal artery occlusion; BRAO branch retinal artery occlusion; RT retinal tear; SB scleral buckle; PPV pars plana vitrectomy; VH Vitreous hemorrhage; PRP panretinal laser photocoagulation; IVK intravitreal kenalog; VMT vitreomacular traction; MH Macular hole;  NVD neovascularization of the disc; NVE neovascularization elsewhere; AREDS age related eye disease study; ARMD age related macular degeneration; POAG primary open angle glaucoma; EBMD epithelial/anterior basement membrane dystrophy; ACIOL anterior chamber intraocular lens; IOL intraocular lens; PCIOL posterior chamber intraocular lens; Phaco/IOL phacoemulsification with intraocular lens placement; [place] photorefractive keratectomy; LASIK laser assisted in situ keratomileusis; HTN hypertension; DM diabetes mellitus; COPD chronic obstructive pulmonary disease

## 2019-05-26 NOTE — Assessment & Plan Note (Signed)
OD, will need intravitreal antiveg F, intravitreal Avastin initially followed thereafter by focal laser treatment to a large region inferotemporally of CSME

## 2019-05-26 NOTE — Assessment & Plan Note (Signed)
OS will schedule focal laser photocoagulation to control the disease near the macula. Injections at this time or not necessary

## 2019-05-26 NOTE — Addendum Note (Signed)
Addended by: Deloria Lair A on: 05/26/2019 12:47 PM   Modules accepted: Orders

## 2019-06-09 ENCOUNTER — Encounter (INDEPENDENT_AMBULATORY_CARE_PROVIDER_SITE_OTHER): Payer: Self-pay | Admitting: Ophthalmology

## 2019-06-09 ENCOUNTER — Encounter (INDEPENDENT_AMBULATORY_CARE_PROVIDER_SITE_OTHER): Payer: PPO | Admitting: Ophthalmology

## 2019-06-09 ENCOUNTER — Other Ambulatory Visit: Payer: Self-pay

## 2019-06-09 ENCOUNTER — Ambulatory Visit (INDEPENDENT_AMBULATORY_CARE_PROVIDER_SITE_OTHER): Payer: PPO | Admitting: Ophthalmology

## 2019-06-09 DIAGNOSIS — E113412 Type 2 diabetes mellitus with severe nonproliferative diabetic retinopathy with macular edema, left eye: Secondary | ICD-10-CM

## 2019-06-09 NOTE — Progress Notes (Signed)
06/09/2019     CHIEF COMPLAINT Patient presents for Retina Follow Up   HISTORY OF PRESENT ILLNESS: Michael Humphrey is a 69 y.o. male who presents to the clinic today for:   HPI    Retina Follow Up    Patient presents with  Diabetic Retinopathy.  In left eye.  Duration of 2 weeks.  Since onset it is stable.          Comments    2 week f.u - Poss Focal OS, OCT OU Patient denies change in vision and overall has no complaints.        Last edited by Gerda Diss on 06/09/2019  2:04 PM. (History)      Referring physician: Kirk Ruths, MD [facility] [place],  [place] [address]  HISTORICAL INFORMATION:   Selected notes from the MEDICAL RECORD NUMBER    Lab Results  Component Value Date   HGBA1C 7.1 (H) 07/15/2017     CURRENT MEDICATIONS: No current outpatient medications on file. (Ophthalmic Drugs)   No current facility-administered medications for this visit. (Ophthalmic Drugs)   Current Outpatient Medications (Other)  Medication Sig  . acetaminophen (TYLENOL) 500 MG tablet Take 1,000 mg by mouth every 6 (six) hours as needed for moderate pain or headache.  . carvedilol (COREG) 12.5 MG tablet Take 12.5 mg by mouth 2 (two) times daily.   . finasteride (PROSCAR) 5 MG tablet Take 5 mg by mouth every morning.   . gabapentin (NEURONTIN) 600 MG tablet Take 600 mg by mouth 2 (two) times daily. 1 TAB IN AM AND 2 AT NIGHT  . glimepiride (AMARYL) 2 MG tablet Take 2 mg by mouth daily with breakfast.  . lisinopril (PRINIVIL,ZESTRIL) 2.5 MG tablet Take 1 tablet (2.5 mg total) by mouth daily. (Patient taking differently: Take 2.5 mg by mouth every morning. )  . metFORMIN (GLUCOPHAGE) 500 MG tablet Take 500 mg by mouth 2 (two) times daily.  . methocarbamol (ROBAXIN) 500 MG tablet Take 1 tablet (500 mg total) by mouth every 6 (six) hours as needed for muscle spasms.  Marland Kitchen oxyCODONE (ROXICODONE) 5 MG immediate release tablet Take 1 tablet (5  mg total) by mouth every 4 (four) hours as needed for breakthrough pain.  Marland Kitchen torsemide (DEMADEX) 20 MG tablet Take 20 mg by mouth daily as needed (swelling).   . traZODone (DESYREL) 100 MG tablet Take 1 tablet (100 mg total) by mouth at bedtime as needed for sleep.   No current facility-administered medications for this visit. (Other)      REVIEW OF SYSTEMS:    ALLERGIES Allergies  Allergen Reactions  . Oxycodone     hallucinations  . Clonidine Derivatives     Hallucinations   . Fioricet [Butalbital-Apap-Caffeine]     hallucination  . Hydrocodone-Acetaminophen     hallucinations  . Mobic [Meloxicam]     hallucinations  . Norvasc [Amlodipine Besylate]     hallucinations  . Statins     Muscle cramps  . Tramadol Itching    PAST MEDICAL HISTORY Past Medical History:  Diagnosis Date  . Arthritis   . Cancer [facility])    SKIN CANCER  . Diabetes mellitus without complication ([place])   . DVT (deep venous thrombosis) ([place]) 07/2017   nonocclusive DVT left internal jugular   . GERD (gastroesophageal reflux disease)    H/O  . High cholesterol   . Hypertension   . Lumbar radiculitis   . Stroke [facility])  07/2017   Past Surgical History:  Procedure Laterality Date  . BUNIONECTOMY    . CATARACT EXTRACTION W/PHACO Left 12/11/2017   Procedure: CATARACT EXTRACTION PHACO AND INTRAOCULAR LENS PLACEMENT ([place])  LEFT DIABETIC;  Surgeon: Leandrew Koyanagi, MD;  Location: [place];  Service: Ophthalmology;  Laterality: Left;  DIABETIC (ACTA picking pt up at 0600, needs 0630 arrival time)  . CATARACT EXTRACTION W/PHACO Right 01/15/2018   Procedure: CATARACT EXTRACTION PHACO AND INTRAOCULAR LENS PLACEMENT ([place])  RIGHT DIABETIC  leave so arrival will be around 7:45 ds;  Surgeon: Leandrew Koyanagi, MD;  Location: [place];  Service: Ophthalmology;  Laterality: Right;  Diabetic - oral meds  . CHOLECYSTECTOMY    . COLONOSCOPY    . COLONOSCOPY WITH PROPOFOL N/A 12/17/2014     Procedure: COLONOSCOPY WITH PROPOFOL;  Surgeon: Lollie Sails, MD;  Location: [facility] ENDOSCOPY;  Service: Endoscopy;  Laterality: N/A;  . correct hammertoe    . JOINT REPLACEMENT    . KNEE ARTHROSCOPY Right   . LUMBAR LAMINECTOMY/ DECOMPRESSION WITH MET-RX N/A 02/18/2019   Procedure: L4-5 DECOMPRESSION;  Surgeon: Meade Maw, MD;  Location: [facility] ORS;  Service: Neurosurgery;  Laterality: N/A;  . TOTAL HIP ARTHROPLASTY    . TOTAL KNEE ARTHROPLASTY Right     FAMILY HISTORY Family History  Problem Relation Age of Onset  . Clotting disorder Father   . Hypertension Father     SOCIAL HISTORY Social History   Tobacco Use  . Smoking status: Never Smoker  . Smokeless tobacco: Former Systems developer    Types: Chew  Substance Use Topics  . Alcohol use: Yes    Comment: RARE  . Drug use: No         OPHTHALMIC EXAM:  Base Eye Exam    Visual Acuity (Snellen - Linear)      Right Left   Dist cc 20/50 20/30+1   Dist ph cc 20/40+1        Tonometry (Tonopen, 2:11 PM)      Right Left   Pressure 19 20       Neuro/Psych    Oriented x3: Yes   Mood/Affect: Normal       Dilation    Left eye: 1.0% Mydriacyl, 2.5% Phenylephrine @ 2:11 PM        Slit Lamp and Fundus Exam    External Exam      Right Left   External Normal Normal       Slit Lamp Exam      Right Left   Lids/Lashes Normal Normal   Conjunctiva/Sclera White and quiet White and quiet   Cornea Clear Clear   Anterior Chamber Deep and quiet Deep and quiet   Iris Round and reactive Round and reactive   Lens Posterior chamber intraocular lens Posterior chamber intraocular lens   Vitreous Normal Normal          IMAGING AND PROCEDURES  Imaging and Procedures for 06/09/19  OCT, Retina - OU - Both Eyes       Right Eye Quality was good. Scan locations included subfoveal.   Left Eye Quality was good. Scan locations included subfoveal.                 ASSESSMENT/PLAN:  No problem-specific Assessment  & Plan notes found for this encounter.      ICD-10-CM   1. Severe nonproliferative diabetic retinopathy of left eye, with macular edema, associated with type 2 diabetes mellitus (HCC)  O16.0737 Focal Laser - OS -  Left Eye    1.  Focal laser applied left eye today temporal to the fovea  2.  OD, currently under therapy for large region of CSME temporally.  She is to return for likely intravitreal Avastin upon next visit  3.  Ophthalmic Meds Ordered this visit:  No orders of the defined types were placed in this encounter.      Return in about 4 weeks (around 07/07/2019), or As scheduled OD, for AVASTIN OCT, OD.  There are no Patient Instructions on file for this visit.   Explained the diagnoses, plan, and follow up with the patient and they expressed understanding.  Patient expressed understanding of the importance of proper follow up care.   Clent Demark Skeeter Sheard M.D. Diseases & Surgery of the Retina and Vitreous Retina & Diabetic [place] 06/09/19     Abbreviations: M myopia (nearsighted); A astigmatism; H hyperopia (farsighted); P presbyopia; Mrx spectacle prescription;  CTL contact lenses; OD right eye; OS left eye; OU both eyes  XT exotropia; ET esotropia; PEK punctate epithelial keratitis; PEE punctate epithelial erosions; DES dry eye syndrome; MGD meibomian gland dysfunction; ATs artificial tears; PFAT's preservative free artificial tears; [place] nuclear sclerotic cataract; PSC posterior subcapsular cataract; ERM epi-retinal membrane; PVD posterior vitreous detachment; RD retinal detachment; DM diabetes mellitus; DR diabetic retinopathy; NPDR non-proliferative diabetic retinopathy; PDR proliferative diabetic retinopathy; CSME clinically significant macular edema; DME diabetic macular edema; dbh dot blot hemorrhages; CWS cotton wool spot; POAG primary open angle glaucoma; C/D cup-to-disc ratio; HVF humphrey visual field; GVF goldmann visual field; OCT optical coherence tomography; IOP  intraocular pressure; BRVO Branch retinal vein occlusion; CRVO central retinal vein occlusion; CRAO central retinal artery occlusion; BRAO branch retinal artery occlusion; RT retinal tear; SB scleral buckle; PPV pars plana vitrectomy; VH Vitreous hemorrhage; PRP panretinal laser photocoagulation; IVK intravitreal kenalog; VMT vitreomacular traction; MH Macular hole;  NVD neovascularization of the disc; NVE neovascularization elsewhere; AREDS age related eye disease study; ARMD age related macular degeneration; POAG primary open angle glaucoma; EBMD epithelial/anterior basement membrane dystrophy; ACIOL anterior chamber intraocular lens; IOL intraocular lens; PCIOL posterior chamber intraocular lens; Phaco/IOL phacoemulsification with intraocular lens placement; [place] photorefractive keratectomy; LASIK laser assisted in situ keratomileusis; HTN hypertension; DM diabetes mellitus; COPD chronic obstructive pulmonary disease

## 2019-06-22 DIAGNOSIS — I10 Essential (primary) hypertension: Secondary | ICD-10-CM | POA: Diagnosis not present

## 2019-06-22 DIAGNOSIS — E7800 Pure hypercholesterolemia, unspecified: Secondary | ICD-10-CM | POA: Diagnosis not present

## 2019-06-22 DIAGNOSIS — N183 Chronic kidney disease, stage 3 unspecified: Secondary | ICD-10-CM | POA: Diagnosis not present

## 2019-06-22 DIAGNOSIS — E1122 Type 2 diabetes mellitus with diabetic chronic kidney disease: Secondary | ICD-10-CM | POA: Diagnosis not present

## 2019-06-22 DIAGNOSIS — Z125 Encounter for screening for malignant neoplasm of prostate: Secondary | ICD-10-CM | POA: Diagnosis not present

## 2019-06-22 DIAGNOSIS — I129 Hypertensive chronic kidney disease with stage 1 through stage 4 chronic kidney disease, or unspecified chronic kidney disease: Secondary | ICD-10-CM | POA: Diagnosis not present

## 2019-06-29 DIAGNOSIS — I779 Disorder of arteries and arterioles, unspecified: Secondary | ICD-10-CM | POA: Diagnosis not present

## 2019-06-29 DIAGNOSIS — I129 Hypertensive chronic kidney disease with stage 1 through stage 4 chronic kidney disease, or unspecified chronic kidney disease: Secondary | ICD-10-CM | POA: Diagnosis not present

## 2019-06-29 DIAGNOSIS — E7800 Pure hypercholesterolemia, unspecified: Secondary | ICD-10-CM | POA: Diagnosis not present

## 2019-06-29 DIAGNOSIS — Z0000 Encounter for general adult medical examination without abnormal findings: Secondary | ICD-10-CM | POA: Diagnosis not present

## 2019-06-29 DIAGNOSIS — G4733 Obstructive sleep apnea (adult) (pediatric): Secondary | ICD-10-CM | POA: Diagnosis not present

## 2019-06-29 DIAGNOSIS — E1122 Type 2 diabetes mellitus with diabetic chronic kidney disease: Secondary | ICD-10-CM | POA: Diagnosis not present

## 2019-06-29 DIAGNOSIS — I10 Essential (primary) hypertension: Secondary | ICD-10-CM | POA: Diagnosis not present

## 2019-06-29 DIAGNOSIS — Z6835 Body mass index (BMI) 35.0-35.9, adult: Secondary | ICD-10-CM | POA: Diagnosis not present

## 2019-06-29 DIAGNOSIS — N183 Chronic kidney disease, stage 3 unspecified: Secondary | ICD-10-CM | POA: Diagnosis not present

## 2019-07-07 ENCOUNTER — Ambulatory Visit (INDEPENDENT_AMBULATORY_CARE_PROVIDER_SITE_OTHER): Payer: PPO | Admitting: Ophthalmology

## 2019-07-07 ENCOUNTER — Encounter (INDEPENDENT_AMBULATORY_CARE_PROVIDER_SITE_OTHER): Payer: Self-pay | Admitting: Ophthalmology

## 2019-07-07 ENCOUNTER — Encounter (INDEPENDENT_AMBULATORY_CARE_PROVIDER_SITE_OTHER): Payer: PPO | Admitting: Ophthalmology

## 2019-07-07 ENCOUNTER — Other Ambulatory Visit: Payer: Self-pay

## 2019-07-07 DIAGNOSIS — E113411 Type 2 diabetes mellitus with severe nonproliferative diabetic retinopathy with macular edema, right eye: Secondary | ICD-10-CM

## 2019-07-07 DIAGNOSIS — E113412 Type 2 diabetes mellitus with severe nonproliferative diabetic retinopathy with macular edema, left eye: Secondary | ICD-10-CM

## 2019-07-07 MED ORDER — BEVACIZUMAB CHEMO INJECTION 1.25MG/0.05ML SYRINGE FOR KALEIDOSCOPE
1.2500 mg | INTRAVITREAL | Status: AC | PRN
  Administered 2019-07-07: 1.25 mg via INTRAVITREAL

## 2019-07-07 NOTE — Assessment & Plan Note (Signed)
OS improved, status post focal laser treatment, will repeat exam in 3 months

## 2019-07-07 NOTE — Assessment & Plan Note (Addendum)
Yes to me OD persists yet fovea protected.  Will repeat intravitreal Avastin today and augment with focal treatment to the active leaking sites within 3 weeks

## 2019-07-07 NOTE — Progress Notes (Signed)
07/07/2019     CHIEF COMPLAINT Patient presents for Retina Follow Up   HISTORY OF PRESENT ILLNESS: Michael Humphrey is a 69 y.o. male who presents to the clinic today for:   HPI    Retina Follow Up    Patient presents with  Diabetic Retinopathy.  In right eye.  Severity is severe.  Duration of 6 weeks.  Since onset it is stable.  I, the attending physician,  performed the HPI with the patient and updated documentation appropriately.          Comments    6 Week NPDR f\u OD. Possible Avastin OD. OCT  Pt states vision is foggy/hazy. Pt states he is sensitive to light.Pt would like to know when he can see Dr. Atilano Median for gls. BGL: did not check       Last edited by Tilda Franco on 07/07/2019  8:10 AM. (History)      Referring physician: Kirk Ruths, MD [facility] [place],  [place] [address]  HISTORICAL INFORMATION:   Selected notes from the MEDICAL RECORD NUMBER    Lab Results  Component Value Date   HGBA1C 7.1 (H) 07/15/2017     CURRENT MEDICATIONS: No current outpatient medications on file. (Ophthalmic Drugs)   No current facility-administered medications for this visit. (Ophthalmic Drugs)   Current Outpatient Medications (Other)  Medication Sig  . acetaminophen (TYLENOL) 500 MG tablet Take 1,000 mg by mouth every 6 (six) hours as needed for moderate pain or headache.  . carvedilol (COREG) 12.5 MG tablet Take 12.5 mg by mouth 2 (two) times daily.   . finasteride (PROSCAR) 5 MG tablet Take 5 mg by mouth every morning.   . gabapentin (NEURONTIN) 600 MG tablet Take 600 mg by mouth 2 (two) times daily. 1 TAB IN AM AND 2 AT NIGHT  . glimepiride (AMARYL) 2 MG tablet Take 2 mg by mouth daily with breakfast.  . lisinopril (PRINIVIL,ZESTRIL) 2.5 MG tablet Take 1 tablet (2.5 mg total) by mouth daily. (Patient taking differently: Take 2.5 mg by mouth every morning. )  . metFORMIN (GLUCOPHAGE) 500 MG tablet Take 500 mg by mouth 2  (two) times daily.  . methocarbamol (ROBAXIN) 500 MG tablet Take 1 tablet (500 mg total) by mouth every 6 (six) hours as needed for muscle spasms.  Marland Kitchen oxyCODONE (ROXICODONE) 5 MG immediate release tablet Take 1 tablet (5 mg total) by mouth every 4 (four) hours as needed for breakthrough pain.  Marland Kitchen torsemide (DEMADEX) 20 MG tablet Take 20 mg by mouth daily as needed (swelling).   . traZODone (DESYREL) 100 MG tablet Take 1 tablet (100 mg total) by mouth at bedtime as needed for sleep.   No current facility-administered medications for this visit. (Other)      REVIEW OF SYSTEMS: ROS    Positive for: Endocrine   Last edited by Tilda Franco on 07/07/2019  8:10 AM. (History)       ALLERGIES Allergies  Allergen Reactions  . Oxycodone     hallucinations  . Clonidine Derivatives     Hallucinations   . Fioricet [Butalbital-Apap-Caffeine]     hallucination  . Hydrocodone-Acetaminophen     hallucinations  . Mobic [Meloxicam]     hallucinations  . Norvasc [Amlodipine Besylate]     hallucinations  . Statins     Muscle cramps  . Tramadol Itching    PAST MEDICAL HISTORY Past Medical History:  Diagnosis Date  .  Arthritis   . Cancer [facility])    SKIN CANCER  . Diabetes mellitus without complication ([place])   . DVT (deep venous thrombosis) ([place]) 07/2017   nonocclusive DVT left internal jugular   . GERD (gastroesophageal reflux disease)    H/O  . High cholesterol   . Hypertension   . Lumbar radiculitis   . Stroke [facility])    07/2017   Past Surgical History:  Procedure Laterality Date  . BUNIONECTOMY    . CATARACT EXTRACTION W/PHACO Left 12/11/2017   Procedure: CATARACT EXTRACTION PHACO AND INTRAOCULAR LENS PLACEMENT ([place])  LEFT DIABETIC;  Surgeon: Leandrew Koyanagi, MD;  Location: [place];  Service: Ophthalmology;  Laterality: Left;  DIABETIC (ACTA picking pt up at 0600, needs 0630 arrival time)  . CATARACT EXTRACTION W/PHACO Right 01/15/2018   Procedure: CATARACT  EXTRACTION PHACO AND INTRAOCULAR LENS PLACEMENT ([place])  RIGHT DIABETIC  leave so arrival will be around 7:45 ds;  Surgeon: Leandrew Koyanagi, MD;  Location: [place];  Service: Ophthalmology;  Laterality: Right;  Diabetic - oral meds  . CHOLECYSTECTOMY    . COLONOSCOPY    . COLONOSCOPY WITH PROPOFOL N/A 12/17/2014   Procedure: COLONOSCOPY WITH PROPOFOL;  Surgeon: Lollie Sails, MD;  Location: [facility] ENDOSCOPY;  Service: Endoscopy;  Laterality: N/A;  . correct hammertoe    . JOINT REPLACEMENT    . KNEE ARTHROSCOPY Right   . LUMBAR LAMINECTOMY/ DECOMPRESSION WITH MET-RX N/A 02/18/2019   Procedure: L4-5 DECOMPRESSION;  Surgeon: Meade Maw, MD;  Location: [facility] ORS;  Service: Neurosurgery;  Laterality: N/A;  . TOTAL HIP ARTHROPLASTY    . TOTAL KNEE ARTHROPLASTY Right     FAMILY HISTORY Family History  Problem Relation Age of Onset  . Clotting disorder Father   . Hypertension Father     SOCIAL HISTORY Social History   Tobacco Use  . Smoking status: Never Smoker  . Smokeless tobacco: Former Systems developer    Types: Secondary school teacher  . Vaping Use: Never used  Substance Use Topics  . Alcohol use: Yes    Comment: RARE  . Drug use: No         OPHTHALMIC EXAM:  Base Eye Exam    Visual Acuity (Snellen - Linear)      Right Left   Dist [place] 20/40 -1 20/25 -1   Dist ph  NI        Tonometry (Tonopen, 8:15 AM)      Right Left   Pressure 19 16       Pupils      Dark Light Shape React APD   Right 4 3 Round Brisk None   Left 4 3 Round Brisk None       Visual Fields (Counting fingers)      Left Right    Full Full       Neuro/Psych    Oriented x3: Yes   Mood/Affect: Normal       Dilation    Right eye: 1.0% Mydriacyl, 2.5% Phenylephrine @ 8:15 AM        Slit Lamp and Fundus Exam    External Exam      Right Left   External Normal Normal       Slit Lamp Exam      Right Left   Lids/Lashes Normal Normal   Conjunctiva/Sclera White and quiet White and  quiet   Cornea Clear Clear   Anterior Chamber Deep and quiet Deep and quiet   Iris Round and reactive  Round and reactive   Lens Posterior chamber intraocular lens Posterior chamber intraocular lens   Anterior Vitreous Normal Normal       Fundus Exam      Right Left   Posterior Vitreous Normal    Disc Normal    C/D Ratio 0.0    Macula Severe clinically significant macular edema, Macular thickening, Microaneurysms,, temporally    Vessels NPDR-Severe    Periphery Normal           IMAGING AND PROCEDURES  Imaging and Procedures for 07/07/19  OCT, Retina - OU - Both Eyes       Right Eye Quality was good. Central Foveal Thickness: 276. Progression has improved.   Left Eye Quality was good. Scan locations included subfoveal. Central Foveal Thickness: 291. Progression has been stable.   Notes OD, extra foveal CSME temporally, slightly improved.  Will repeat intravitreal Avastin today and schedule focal laser treatment in 2 to 3 weeks right eye       Intravitreal Injection, Pharmacologic Agent - OD - Right Eye       Time Out 07/07/2019. 8:59 AM. Confirmed correct patient, procedure, site, and patient consented.   Anesthesia Topical anesthesia was used. Anesthetic medications included [place] 3.5%.   Procedure Preparation included Tobramycin 0.3%, 10% betadine to eyelids. A 30 gauge needle was used.   Injection:  1.25 mg Bevacizumab (AVASTIN) SOLN   NDC: [address]-7   Route: Intravitreal, Site: Right Eye, Waste: 0 mg  Post-op Post injection exam found visual acuity of at least counting fingers. The patient tolerated the procedure well. There were no complications. The patient received written and verbal post procedure care education. Post injection medications were not given.                 ASSESSMENT/PLAN:  Severe nonproliferative diabetic retinopathy of left eye, with macular edema, associated with type 2 diabetes mellitus (HCC) OS improved, status post  focal laser treatment, will repeat exam in 3 months  Severe nonproliferative diabetic retinopathy of right eye, with macular edema, associated with type 2 diabetes mellitus ([place]) Yes to me OD persists yet fovea protected.  Will repeat intravitreal Avastin today and augment with focal treatment to the active leaking sites within 3 weeks      ICD-10-CM   1. Severe nonproliferative diabetic retinopathy of right eye, with macular edema, associated with type 2 diabetes mellitus (HCC)  E11.3411 OCT, Retina - OU - Both Eyes    Intravitreal Injection, Pharmacologic Agent - OD - Right Eye    Bevacizumab (AVASTIN) SOLN 1.25 mg  2. Severe nonproliferative diabetic retinopathy of left eye, with macular edema, associated with type 2 diabetes mellitus ([place])  E72.0947     1.  2.  3.  Ophthalmic Meds Ordered this visit:  Meds ordered this encounter  Medications  . Bevacizumab (AVASTIN) SOLN 1.25 mg       Return in about 3 weeks (around 07/28/2019) for dilate, OD, FOCAL.  There are no Patient Instructions on file for this visit.   Explained the diagnoses, plan, and follow up with the patient and they expressed understanding.  Patient expressed understanding of the importance of proper follow up care.   Clent Demark Pearle Wandler M.D. Diseases & Surgery of the Retina and Vitreous Retina & Diabetic [place] 07/07/19     Abbreviations: M myopia (nearsighted); A astigmatism; H hyperopia (farsighted); P presbyopia; Mrx spectacle prescription;  CTL contact lenses; OD right eye; OS left eye; OU both eyes  XT exotropia;  ET esotropia; PEK punctate epithelial keratitis; PEE punctate epithelial erosions; DES dry eye syndrome; MGD meibomian gland dysfunction; ATs artificial tears; PFAT's preservative free artificial tears; [place] nuclear sclerotic cataract; PSC posterior subcapsular cataract; ERM epi-retinal membrane; PVD posterior vitreous detachment; RD retinal detachment; DM diabetes mellitus; DR diabetic  retinopathy; NPDR non-proliferative diabetic retinopathy; PDR proliferative diabetic retinopathy; CSME clinically significant macular edema; DME diabetic macular edema; dbh dot blot hemorrhages; CWS cotton wool spot; POAG primary open angle glaucoma; C/D cup-to-disc ratio; HVF humphrey visual field; GVF goldmann visual field; OCT optical coherence tomography; IOP intraocular pressure; BRVO Branch retinal vein occlusion; CRVO central retinal vein occlusion; CRAO central retinal artery occlusion; BRAO branch retinal artery occlusion; RT retinal tear; SB scleral buckle; PPV pars plana vitrectomy; VH Vitreous hemorrhage; PRP panretinal laser photocoagulation; IVK intravitreal kenalog; VMT vitreomacular traction; MH Macular hole;  NVD neovascularization of the disc; NVE neovascularization elsewhere; AREDS age related eye disease study; ARMD age related macular degeneration; POAG primary open angle glaucoma; EBMD epithelial/anterior basement membrane dystrophy; ACIOL anterior chamber intraocular lens; IOL intraocular lens; PCIOL posterior chamber intraocular lens; Phaco/IOL phacoemulsification with intraocular lens placement; [place] photorefractive keratectomy; LASIK laser assisted in situ keratomileusis; HTN hypertension; DM diabetes mellitus; COPD chronic obstructive pulmonary disease

## 2019-07-28 ENCOUNTER — Ambulatory Visit (INDEPENDENT_AMBULATORY_CARE_PROVIDER_SITE_OTHER): Payer: PPO | Admitting: Ophthalmology

## 2019-07-28 ENCOUNTER — Encounter (INDEPENDENT_AMBULATORY_CARE_PROVIDER_SITE_OTHER): Payer: PPO | Admitting: Ophthalmology

## 2019-07-28 ENCOUNTER — Other Ambulatory Visit: Payer: Self-pay

## 2019-07-28 ENCOUNTER — Encounter (INDEPENDENT_AMBULATORY_CARE_PROVIDER_SITE_OTHER): Payer: Self-pay | Admitting: Ophthalmology

## 2019-07-28 DIAGNOSIS — E113411 Type 2 diabetes mellitus with severe nonproliferative diabetic retinopathy with macular edema, right eye: Secondary | ICD-10-CM | POA: Diagnosis not present

## 2019-07-28 MED ORDER — BACITRACIN-POLYMYXIN B 500-10000 UNIT/GM OP OINT: 1.0000 "application " | TOPICAL_OINTMENT | Freq: Three times a day (TID) | OPHTHALMIC | 1 refills | Status: AC

## 2019-07-28 NOTE — Progress Notes (Signed)
07/28/2019     CHIEF COMPLAINT Patient presents for Retina Follow Up   HISTORY OF PRESENT ILLNESS: Michael Humphrey is a 69 y.o. male who presents to the clinic today for:   HPI    Retina Follow Up    Patient presents with  Diabetic Retinopathy.  In right eye.  Duration of 3 weeks.  Since onset it is stable.          Comments    3 week follow up - OCT OU, Focal OD Patient denies change in vision and overall has no complaints; except for a 'stye' OD. LBS 'less than 100' /// A1C 7       Last edited by Gerda Diss on 07/28/2019  8:08 AM. (History)      Referring physician: Kirk Ruths, MD [facility] [place],  [place] [address]  HISTORICAL INFORMATION:   Selected notes from the MEDICAL RECORD NUMBER    Lab Results  Component Value Date   HGBA1C 7.1 (H) 07/15/2017     CURRENT MEDICATIONS: Current Outpatient Medications (Ophthalmic Drugs)  Medication Sig   bacitracin-polymyxin b (POLYSPORIN) ophthalmic ointment Place 1 application into the right eye 3 (three) times daily for 17 days. Place a 1/2 inch ribbon of ointment into the lower eyelid.   No current facility-administered medications for this visit. (Ophthalmic Drugs)   Current Outpatient Medications (Other)  Medication Sig   acetaminophen (TYLENOL) 500 MG tablet Take 1,000 mg by mouth every 6 (six) hours as needed for moderate pain or headache.   carvedilol (COREG) 12.5 MG tablet Take 12.5 mg by mouth 2 (two) times daily.    finasteride (PROSCAR) 5 MG tablet Take 5 mg by mouth every morning.    gabapentin (NEURONTIN) 600 MG tablet Take 600 mg by mouth 2 (two) times daily. 1 TAB IN AM AND 2 AT NIGHT   glimepiride (AMARYL) 2 MG tablet Take 2 mg by mouth daily with breakfast.   lisinopril (PRINIVIL,ZESTRIL) 2.5 MG tablet Take 1 tablet (2.5 mg total) by mouth daily. (Patient taking differently: Take 2.5 mg by mouth every morning. )   metFORMIN (GLUCOPHAGE) 500  MG tablet Take 500 mg by mouth 2 (two) times daily.   methocarbamol (ROBAXIN) 500 MG tablet Take 1 tablet (500 mg total) by mouth every 6 (six) hours as needed for muscle spasms.   oxyCODONE (ROXICODONE) 5 MG immediate release tablet Take 1 tablet (5 mg total) by mouth every 4 (four) hours as needed for breakthrough pain.   torsemide (DEMADEX) 20 MG tablet Take 20 mg by mouth daily as needed (swelling).    traZODone (DESYREL) 100 MG tablet Take 1 tablet (100 mg total) by mouth at bedtime as needed for sleep.   No current facility-administered medications for this visit. (Other)      REVIEW OF SYSTEMS:    ALLERGIES Allergies  Allergen Reactions   Oxycodone     hallucinations   Clonidine Derivatives     Hallucinations    Fioricet [Butalbital-Apap-Caffeine]     hallucination   Hydrocodone-Acetaminophen     hallucinations   Mobic [Meloxicam]     hallucinations   Norvasc [Amlodipine Besylate]     hallucinations   Statins     Muscle cramps   Tramadol Itching    PAST MEDICAL HISTORY Past Medical History:  Diagnosis Date   Arthritis    Cancer [facility])    SKIN CANCER   Diabetes mellitus without complication ([place])  DVT (deep venous thrombosis) ([place]) 07/2017   nonocclusive DVT left internal jugular    GERD (gastroesophageal reflux disease)    H/O   High cholesterol    Hypertension    Lumbar radiculitis    Stroke ([place])    07/2017   Past Surgical History:  Procedure Laterality Date   BUNIONECTOMY     CATARACT EXTRACTION W/PHACO Left 12/11/2017   Procedure: CATARACT EXTRACTION PHACO AND INTRAOCULAR LENS PLACEMENT ([place])  LEFT DIABETIC;  Surgeon: Leandrew Koyanagi, MD;  Location: [place];  Service: Ophthalmology;  Laterality: Left;  DIABETIC (ACTA picking pt up at 0600, needs 0630 arrival time)   CATARACT EXTRACTION W/PHACO Right 01/15/2018   Procedure: CATARACT EXTRACTION PHACO AND INTRAOCULAR LENS PLACEMENT ([place])  RIGHT DIABETIC  leave  so arrival will be around 7:45 ds;  Surgeon: Leandrew Koyanagi, MD;  Location: [place];  Service: Ophthalmology;  Laterality: Right;  Diabetic - oral meds   CHOLECYSTECTOMY     COLONOSCOPY     COLONOSCOPY WITH PROPOFOL N/A 12/17/2014   Procedure: COLONOSCOPY WITH PROPOFOL;  Surgeon: Lollie Sails, MD;  Location[facility] ENDOSCOPY;  Service: Endoscopy;  Laterality: N/A;   correct hammertoe     JOINT REPLACEMENT     KNEE ARTHROSCOPY Right    LUMBAR LAMINECTOMY/ DECOMPRESSION WITH MET-RX N/A 02/18/2019   Procedure: L4-5 DECOMPRESSION;  Surgeon: Meade Maw, MD;  Location: [facility] ORS;  Service: Neurosurgery;  Laterality: N/A;   TOTAL HIP ARTHROPLASTY     TOTAL KNEE ARTHROPLASTY Right     FAMILY HISTORY Family History  Problem Relation Age of Onset   Clotting disorder Father    Hypertension Father     SOCIAL HISTORY Social History   Tobacco Use   Smoking status: Never Smoker   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, children's Use: Never used  Substance Use Topics   Alcohol use: Yes    Comment: RARE   Drug use: No         OPHTHALMIC EXAM:  Base Eye Exam    Visual Acuity (Snellen - Linear)      Right Left   Dist [place] 20/40 20/25-1       Tonometry (Tonopen, 8:12 AM)      Right Left   Pressure 15 19       Pupils      Pupils Dark Light Shape React APD   Right PERRL 4 3 Round Brisk None   Left PERRL 4 3 Round Brisk None       Visual Fields (Counting fingers)      Left Right    Full Full       Extraocular Movement      Right Left    Full Full       Neuro/Psych    Oriented x3: Yes   Mood/Affect: Normal       Dilation    Right eye: 1.0% Mydriacyl, 2.5% Phenylephrine @ 8:12 AM        Slit Lamp and Fundus Exam    External Exam      Right Left   External Normal Normal       Slit Lamp Exam      Right Left   Lids/Lashes Hordeolum - lower lid, medially, 2+ Scurf, Stye, external Normal    Conjunctiva/Sclera White and quiet White and quiet   Cornea Clear Clear   Anterior Chamber Deep and quiet Deep and quiet   Iris Round and  reactive Round and reactive   Lens Posterior chamber intraocular lens Posterior chamber intraocular lens   Anterior Vitreous Normal Normal          IMAGING AND PROCEDURES  Imaging and Procedures for 07/28/19  OCT, Retina - OU - Both Eyes       Right Eye Quality was good. Scan locations included subfoveal. Central Foveal Thickness: 261. Progression has improved.   Left Eye Quality was good. Scan locations included subfoveal.   Notes CSME, extra foveal, temporal to the fovea OD, will deliver focal laser treatment today       Focal Laser - OD - Right Eye       Time Out Confirmed correct patient, procedure, site, and patient consented.   Anesthesia Topical anesthesia was used. Anesthetic medications included Proparacaine 0.5%.   Laser Information The type of laser was diode. Color was yellow. The duration in seconds was 0.1. The spot size was 150 microns. Laser power was 110. Total spots was 46.   Post-op The patient tolerated the procedure well. There were no complications. The patient received written and verbal post procedure care education.                 ASSESSMENT/PLAN:  No problem-specific Assessment & Plan notes found for this encounter.      ICD-10-CM   1. Severe nonproliferative diabetic retinopathy of right eye, with macular edema, associated with type 2 diabetes mellitus (HCC)  E11.3411 OCT, Retina - OU - Both Eyes    Focal Laser - OD - Right Eye    1.  Focal laser photocoagulation OD today.  2.  Patient with new onset external hordeolum right lower lid, will offer her topical Polysporin and avoid the allergy effects of neomycin. 3.  Ophthalmic Meds Ordered this visit:  Meds ordered this encounter  Medications   bacitracin-polymyxin b (POLYSPORIN) ophthalmic ointment    Sig: Place 1 application into  the right eye 3 (three) times daily for 17 days. Place a 1/2 inch ribbon of ointment into the lower eyelid.    Dispense:  3.5 g    Refill:  1       Return in about 4 months (around 11/28/2019) for DILATE OU, OCT, COLOR FP.  There are no Patient Instructions on file for this visit.   Explained the diagnoses, plan, and follow up with the patient and they expressed understanding.  Patient expressed understanding of the importance of proper follow up care.   Clent Demark Sen[place] Hoback M.D. Diseases & Surgery of the Retina and Vitreous Retina & Diabetic [place] 07/28/19     Abbreviations: M myopia (nearsighted); A astigmatism; H hyperopia (farsighted); P presbyopia; Mrx spectacle prescription;  CTL contact lenses; OD right eye; OS left eye; OU both eyes  XT exotropia; ET esotropia; PEK punctate epithelial keratitis; PEE punctate epithelial erosions; DES dry eye syndrome; MGD meibomian gland dysfunction; ATs artificial tears; PFAT's preservative free artificial tears; [place] nuclear sclerotic cataract; PSC posterior subcapsular cataract; ERM epi-retinal membrane; PVD posterior vitreous detachment; RD retinal detachment; DM diabetes mellitus; DR diabetic retinopathy; NPDR non-proliferative diabetic retinopathy; PDR proliferative diabetic retinopathy; CSME clinically significant macular edema; DME diabetic macular edema; dbh dot blot hemorrhages; CWS cotton wool spot; POAG primary open angle glaucoma; C/D cup-to-disc ratio; HVF humphrey visual field; GVF goldmann visual field; OCT optical coherence tomography; IOP intraocular pressure; BRVO Branch retinal vein occlusion; CRVO central retinal vein occlusion; CRAO central retinal artery occlusion; BRAO branch retinal artery occlusion; RT retinal tear; SB scleral  buckle; PPV pars plana vitrectomy; VH Vitreous hemorrhage; PRP panretinal laser photocoagulation; IVK intravitreal kenalog; VMT vitreomacular traction; MH Macular hole;  NVD neovascularization of the disc;  NVE neovascularization elsewhere; AREDS age related eye disease study; ARMD age related macular degeneration; POAG primary open angle glaucoma; EBMD epithelial/anterior basement membrane dystrophy; ACIOL anterior chamber intraocular lens; IOL intraocular lens; PCIOL posterior chamber intraocular lens; Phaco/IOL phacoemulsification with intraocular lens placement; [place] photorefractive keratectomy; LASIK laser assisted in situ keratomileusis; HTN hypertension; DM diabetes mellitus; COPD chronic obstructive pulmonary disease

## 2019-08-19 DIAGNOSIS — M79672 Pain in left foot: Secondary | ICD-10-CM | POA: Diagnosis not present

## 2019-08-19 DIAGNOSIS — M2042 Other hammer toe(s) (acquired), left foot: Secondary | ICD-10-CM | POA: Diagnosis not present

## 2019-08-19 DIAGNOSIS — M722 Plantar fascial fibromatosis: Secondary | ICD-10-CM | POA: Diagnosis not present

## 2019-08-19 DIAGNOSIS — M79671 Pain in right foot: Secondary | ICD-10-CM | POA: Diagnosis not present

## 2019-08-24 DIAGNOSIS — L97911 Non-pressure chronic ulcer of unspecified part of right lower leg limited to breakdown of skin: Secondary | ICD-10-CM | POA: Diagnosis not present

## 2019-09-02 DIAGNOSIS — M216X1 Other acquired deformities of right foot: Secondary | ICD-10-CM | POA: Diagnosis not present

## 2019-09-02 DIAGNOSIS — T148XXA Other injury of unspecified body region, initial encounter: Secondary | ICD-10-CM | POA: Diagnosis not present

## 2019-09-02 DIAGNOSIS — M2012 Hallux valgus (acquired), left foot: Secondary | ICD-10-CM | POA: Diagnosis not present

## 2019-09-02 DIAGNOSIS — I83019 Varicose veins of right lower extremity with ulcer of unspecified site: Secondary | ICD-10-CM | POA: Diagnosis not present

## 2019-09-02 DIAGNOSIS — M216X2 Other acquired deformities of left foot: Secondary | ICD-10-CM | POA: Diagnosis not present

## 2019-09-02 DIAGNOSIS — L97919 Non-pressure chronic ulcer of unspecified part of right lower leg with unspecified severity: Secondary | ICD-10-CM | POA: Diagnosis not present

## 2019-09-02 DIAGNOSIS — M722 Plantar fascial fibromatosis: Secondary | ICD-10-CM | POA: Diagnosis not present

## 2019-09-02 DIAGNOSIS — M2011 Hallux valgus (acquired), right foot: Secondary | ICD-10-CM | POA: Diagnosis not present

## 2019-09-02 DIAGNOSIS — L97912 Non-pressure chronic ulcer of unspecified part of right lower leg with fat layer exposed: Secondary | ICD-10-CM | POA: Diagnosis not present

## 2019-09-02 DIAGNOSIS — L03115 Cellulitis of right lower limb: Secondary | ICD-10-CM | POA: Diagnosis not present

## 2019-09-02 DIAGNOSIS — M2042 Other hammer toe(s) (acquired), left foot: Secondary | ICD-10-CM | POA: Diagnosis not present

## 2019-09-02 DIAGNOSIS — I739 Peripheral vascular disease, unspecified: Secondary | ICD-10-CM | POA: Diagnosis not present

## 2019-09-03 ENCOUNTER — Other Ambulatory Visit (INDEPENDENT_AMBULATORY_CARE_PROVIDER_SITE_OTHER): Payer: Self-pay | Admitting: Vascular Surgery

## 2019-09-03 DIAGNOSIS — L97929 Non-pressure chronic ulcer of unspecified part of left lower leg with unspecified severity: Secondary | ICD-10-CM

## 2019-09-04 ENCOUNTER — Encounter (INDEPENDENT_AMBULATORY_CARE_PROVIDER_SITE_OTHER): Payer: Self-pay | Admitting: Vascular Surgery

## 2019-09-04 ENCOUNTER — Ambulatory Visit (INDEPENDENT_AMBULATORY_CARE_PROVIDER_SITE_OTHER): Payer: PPO | Admitting: Vascular Surgery

## 2019-09-04 ENCOUNTER — Other Ambulatory Visit: Payer: Self-pay

## 2019-09-04 ENCOUNTER — Ambulatory Visit (INDEPENDENT_AMBULATORY_CARE_PROVIDER_SITE_OTHER): Payer: PPO

## 2019-09-04 VITALS — BP 185/100 | HR 63 | Ht 69.0 in | Wt 210.0 lb

## 2019-09-04 DIAGNOSIS — I2694 Multiple subsegmental pulmonary emboli without acute cor pulmonale: Secondary | ICD-10-CM

## 2019-09-04 DIAGNOSIS — L97929 Non-pressure chronic ulcer of unspecified part of left lower leg with unspecified severity: Secondary | ICD-10-CM

## 2019-09-04 DIAGNOSIS — L97211 Non-pressure chronic ulcer of right calf limited to breakdown of skin: Secondary | ICD-10-CM

## 2019-09-04 DIAGNOSIS — E113412 Type 2 diabetes mellitus with severe nonproliferative diabetic retinopathy with macular edema, left eye: Secondary | ICD-10-CM | POA: Diagnosis not present

## 2019-09-04 NOTE — Assessment & Plan Note (Signed)
With upcoming foot surgery and nonhealing ulcerations, noninvasive studies were performed today to evaluate his perfusion. We performed noninvasive studies today which showed normal ABIs with triphasic waveforms and normal digital pressures today consistent with no arterial insufficiency.  He has adequate blood flow for wound healing with his upcoming surgery and his ulcers.  No vascular intervention planned at this time.  Return as needed.

## 2019-09-04 NOTE — Patient Instructions (Signed)
Peripheral Vascular Disease  Peripheral vascular disease (PVD) is a disease of the blood vessels that are not part of your heart and brain. A simple term for PVD is poor circulation. In most cases, PVD narrows the blood vessels that carry blood from your heart to the rest of your body. This can reduce the supply of blood to your arms, legs, and internal organs, like your stomach or kidneys. However, PVD most often affects a person's lower legs and feet. Without treatment, PVD tends to get worse. PVD can also lead to acute ischemic limb. This is when an arm or leg suddenly cannot get enough blood. This is a medical emergency. Follow these instructions at home: Lifestyle  Do not use any products that contain nicotine or tobacco, such as cigarettes and e-cigarettes. If you need help quitting, ask your doctor.  Lose weight if you are overweight. Or, stay at a healthy weight as told by your doctor.  Eat a diet that is low in fat and cholesterol. If you need help, ask your doctor.  Exercise regularly. Ask your doctor for activities that are right for you. General instructions  Take over-the-counter and prescription medicines only as told by your doctor.  Take good care of your feet: ? Wear comfortable shoes that fit well. ? Check your feet often for any cuts or sores.  Keep all follow-up visits as told by your doctor This is important. Contact a doctor if:  You have cramps in your legs when you walk.  You have leg pain when you are at rest.  You have coldness in a leg or foot.  Your skin changes.  You are unable to get or have an erection (erectile dysfunction).  You have cuts or sores on your feet that do not heal. Get help right away if:  Your arm or leg turns cold, numb, and blue.  Your arms or legs become red, warm, swollen, painful, or numb.  You have chest pain.  You have trouble breathing.  You suddenly have weakness in your face, arm, or leg.  You become very  confused or you cannot speak.  You suddenly have a very bad headache.  You suddenly cannot see. Summary  Peripheral vascular disease (PVD) is a disease of the blood vessels.  A simple term for PVD is poor circulation. Without treatment, PVD tends to get worse.  Treatment may include exercise, low fat and low cholesterol diet, and quitting smoking. This information is not intended to replace advice given to you by your health care provider. Make sure you discuss any questions you have with your health care provider. Document Revised: 11/30/2016 Document Reviewed: 01/26/2016 Elsevier Patient Education  2020 Elsevier Inc.  

## 2019-09-04 NOTE — Assessment & Plan Note (Signed)
blood glucose control important in reducing the progression of atherosclerotic disease. Also, involved in wound healing. On appropriate medications.  

## 2019-09-04 NOTE — Assessment & Plan Note (Signed)
No current symptoms.  Is on anticoagulation.

## 2019-09-04 NOTE — Progress Notes (Signed)
Patient ID: Michael Humphrey, male   DOB: [birth year], 69 y.o.   MRN: [id number]  Chief Complaint  Patient presents with  . New Patient (Initial Visit)    LE ulcer U/S    HPI Michael Humphrey is a 69 y.o. male.  I am asked to see the patient by Dr. Luana Shu for evaluation of PAD with nonhealing ulcerations on the right leg and an upcoming right foot surgery.  Patient has a long medical history has had a previous DVT as well as a stroke.  He has a small ulcer just above the anterior right ankle and another 1 in the medial lower leg on the right.  These are nonpainful.  He has mild lower extremity swelling.  He also has a place on his foot this can require surgery and this is upcoming in the near future.  His podiatrist recommended he have his perfusion evaluated.  We performed noninvasive studies today which showed normal ABIs with triphasic waveforms and normal digital pressures today consistent with no arterial insufficiency.     Past Medical History:  Diagnosis Date  . Arthritis   . Cancer [facility])    SKIN CANCER  . Diabetes mellitus without complication ([place])   . DVT (deep venous thrombosis) ([place]) 07/2017   nonocclusive DVT left internal jugular   . GERD (gastroesophageal reflux disease)    H/O  . High cholesterol   . Hypertension   . Lumbar radiculitis   . Stroke [facility])    07/2017    Past Surgical History:  Procedure Laterality Date  . BUNIONECTOMY    . CATARACT EXTRACTION W/PHACO Left 12/11/2017   Procedure: CATARACT EXTRACTION PHACO AND INTRAOCULAR LENS PLACEMENT ([place])  LEFT DIABETIC;  Surgeon: Leandrew Koyanagi, MD;  Location: [place];  Service: Ophthalmology;  Laterality: Left;  DIABETIC ([facility] picking pt up at 0600, needs 0630 arrival time)  . CATARACT EXTRACTION W/PHACO Right 01/15/2018   Procedure: CATARACT EXTRACTION PHACO AND INTRAOCULAR LENS PLACEMENT ([place])  RIGHT DIABETIC  leave so arrival will be around 7:45 ds;  Surgeon: Leandrew Koyanagi,  MD;  Location: [place];  Service: Ophthalmology;  Laterality: Right;  Diabetic - oral meds  . CHOLECYSTECTOMY    . COLONOSCOPY    . COLONOSCOPY WITH PROPOFOL N/A 12/17/2014   Procedure: COLONOSCOPY WITH PROPOFOL;  Surgeon: Lollie Sails, MD;  Location: [facility] ENDOSCOPY;  Service: Endoscopy;  Laterality: N/A;  . correct hammertoe    . JOINT REPLACEMENT    . KNEE ARTHROSCOPY Right   . LUMBAR LAMINECTOMY/ DECOMPRESSION WITH MET-RX N/A 02/18/2019   Procedure: L4-5 DECOMPRESSION;  Surgeon: Meade Maw, MD;  Location: [facility] ORS;  Service: Neurosurgery;  Laterality: N/A;  . TOTAL HIP ARTHROPLASTY    . TOTAL KNEE ARTHROPLASTY Right      Family History  Problem Relation Age of Onset  . Clotting disorder Father   . Hypertension Father   No aneurysms No autoimmune diseases   Social History   Tobacco Use  . Smoking status: Never Smoker  . Smokeless tobacco: Former Systems developer    Types: Secondary school teacher  . Vaping Use: Never used  Substance Use Topics  . Alcohol use: Yes    Comment: RARE  . Drug use: No     Allergies  Allergen Reactions  . Oxycodone     hallucinations  . Clonidine Derivatives     Hallucinations   . Fioricet [Butalbital-Apap-Caffeine]     hallucination  . Hydrocodone-Acetaminophen  hallucinations  . Mobic [Meloxicam]     hallucinations  . Norvasc [Amlodipine Besylate]     hallucinations  . Statins     Muscle cramps  . Tramadol Itching    Current Outpatient Medications  Medication Sig Dispense Refill  . acetaminophen (TYLENOL) 500 MG tablet Take 1,000 mg by mouth every 6 (six) hours as needed for moderate pain or headache.    Marland Kitchen amoxicillin-clavulanate (AUGMENTIN) 875-125 MG tablet Take by mouth.    Marland Kitchen amoxicillin-clavulanate (AUGMENTIN) 875-125 MG tablet Take 1 tablet by mouth 2 (two) times daily.    . bacitracin-polymyxin b (POLYSPORIN) ophthalmic ointment     . carvedilol (COREG) 12.5 MG tablet Take 12.5 mg by mouth 2 (two) times daily.    11  . ELIQUIS 5 MG TABS tablet Take 5 mg by mouth 2 (two) times daily.    . famciclovir (FAMVIR) 500 MG tablet Take by mouth.    . famciclovir (FAMVIR) 500 MG tablet Take 500 mg by mouth 3 (three) times daily.    . finasteride (PROSCAR) 5 MG tablet Take 5 mg by mouth every morning.     . gabapentin (NEURONTIN) 600 MG tablet Take 600 mg by mouth 2 (two) times daily. 1 TAB IN AM AND 2 AT NIGHT    . glimepiride (AMARYL) 2 MG tablet Take 2 mg by mouth daily with breakfast.    . lisinopril (PRINIVIL,ZESTRIL) 2.5 MG tablet Take 1 tablet (2.5 mg total) by mouth daily. (Patient taking differently: Take 2.5 mg by mouth every morning. )    . metFORMIN (GLUCOPHAGE) 500 MG tablet Take 500 mg by mouth 2 (two) times daily.    . methocarbamol (ROBAXIN) 500 MG tablet Take 1 tablet (500 mg total) by mouth every 6 (six) hours as needed for muscle spasms. 60 tablet 0  . mupirocin ointment (BACTROBAN) 2 % Apply 1 application topically 3 (three) times daily.    Marland Kitchen oxyCODONE (ROXICODONE) 5 MG immediate release tablet Take 1 tablet (5 mg total) by mouth every 4 (four) hours as needed for breakthrough pain. 30 tablet 0  . torsemide (DEMADEX) 20 MG tablet Take 20 mg by mouth daily as needed (swelling).     . traZODone (DESYREL) 100 MG tablet Take 1 tablet (100 mg total) by mouth at bedtime as needed for sleep. 30 tablet 0   No current facility-administered medications for this visit.      REVIEW OF SYSTEMS (Negative unless checked)  Constitutional: [] Weight loss  [] Fever  [] Chills Cardiac: [] Chest pain   [] Chest pressure   [] Palpitations   [] Shortness of breath when laying flat   [] Shortness of breath at rest   [] Shortness of breath with exertion. Vascular:  [] Pain in legs with walking   [] Pain in legs at rest   [] Pain in legs when laying flat   [] Claudication   [] Pain in feet when walking  [] Pain in feet at rest  [] Pain in feet when laying flat   [] History of DVT   [] Phlebitis   [] Swelling in legs   [] Varicose  veins   [x] Non-healing ulcers Pulmonary:   [] Uses home oxygen   [] Productive cough   [] Hemoptysis   [] Wheeze  [] COPD   [] Asthma Neurologic:  [] Dizziness  [] Blackouts   [] Seizures   [x] History of stroke   [] History of TIA  [] Aphasia   [] Temporary blindness   [] Dysphagia   [] Weakness or numbness in arms   [] Weakness or numbness in legs Musculoskeletal:  [x] Arthritis   [] Joint swelling   [] Joint pain   []   Low back pain Hematologic:  [] Easy bruising  [] Easy bleeding   [] Hypercoagulable state   [] Anemic  [] Hepatitis Gastrointestinal:  [] Blood in stool   [] Vomiting blood  [] Gastroesophageal reflux/heartburn   [] Abdominal pain Genitourinary:  [] Chronic kidney disease   [] Difficult urination  [] Frequent urination  [] Burning with urination   [] Hematuria Skin:  [] Rashes   [x] Ulcers   [x] Wounds Psychological:  [] History of anxiety   []  History of major depression.    Physical Exam BP (!) 185/100   Pulse 63   Ht 5' 9"  (1.753 m)   Wt 210 lb (95.3 kg)   BMI 31.01 kg/m  Gen:  WD/WN, NAD Head: [place]/AT, No temporalis wasting.  Ear/Nose/Throat: Hearing grossly intact, nares w/o erythema or drainage, oropharynx w/o Erythema/Exudate Eyes: Conjunctiva clear, sclera non-icteric  Neck: trachea midline.  No JVD.  Pulmonary:  Good air movement, respirations not labored, no use of accessory muscles  Cardiac: RRR, no JVD Vascular:  Vessel Right Left  Radial Palpable Palpable                          DP  2+  2+  PT  1+  1+   Gastrointestinal:. No masses, surgical incisions, or scars. Musculoskeletal: M/S 5/5 throughout.  Extremities without ischemic changes.  No deformity or atrophy.  2 small ulcerations on the right lower leg as described above.  No erythema or drainage.  Mild bilateral lower extremity edema. Neurologic: Sensation grossly intact in extremities.  Symmetrical.  Speech is fluent. Motor exam as listed above. Psychiatric: Judgment intact, Mood & affect appropriate for pt's clinical  situation. Dermatologic: As above    Radiology ABI WITH/WO TBI  Result Date: 09/04/2019 LOWER EXTREMITY DOPPLER STUDY Indications: Ulceration. High Risk Factors: Hypertension, Diabetes.  Performing Technologist: Charlane Ferretti RT (R)(VS)  Examination Guidelines: A complete evaluation includes at minimum, Doppler waveform signals and systolic blood pressure reading at the level of bilateral brachial, anterior tibial, and posterior tibial arteries, when vessel segments are accessible. Bilateral testing is considered an integral part of a complete examination. Photoelectric Plethysmograph (PPG) waveforms and toe systolic pressure readings are included as required and additional duplex testing as needed. Limited examinations for reoccurring indications may be performed as noted.  ABI Findings: +---------+------------------+-----+---------+----------------+ Right    Rt Pressure (mmHg)IndexWaveform Comment          +---------+------------------+-----+---------+----------------+ Brachial 196                                              +---------+------------------+-----+---------+----------------+ ATA                             triphasicNon compressable +---------+------------------+-----+---------+----------------+ PTA                             biphasic 201              +---------+------------------+-----+---------+----------------+ Great Toe198               0.98 Normal                    +---------+------------------+-----+---------+----------------+ +---------+------------------+-----+--------+-------+ Left     Lt Pressure (mmHg)IndexWaveformComment +---------+------------------+-----+--------+-------+ Brachial 202                                    +---------+------------------+-----+--------+-------+  ATA      212               1.05 biphasic        +---------+------------------+-----+--------+-------+ PTA      173               0.86 biphasic         +---------+------------------+-----+--------+-------+ Great Toe142               0.70 Normal          +---------+------------------+-----+--------+-------+ Summary: Right: Resting right ankle-brachial index indicates noncompressible right lower extremity arteries. The right toe-brachial index is normal. ABIs are unreliable. Left: Resting left ankle-brachial index is within normal range. No evidence of significant left lower extremity arterial disease. The left toe-brachial index is normal. Although ankle brachial indices are within normal limits (0.95-1.29), arterial Doppler waveforms at the ankle suggest some component of arterial occlusive disease.  *See table(s) above for measurements and observations.  Electronically signed by Leotis Pain MD on 09/04/2019 at 12:47:06 PM.   Final     Labs No results found for this or any previous visit (from the past 2160 hour(s)).  Assessment/Plan:  Pulmonary embolism (HCC) No current symptoms.  Is on anticoagulation.  Severe nonproliferative diabetic retinopathy of left eye, with macular edema, associated with type 2 diabetes mellitus ([place]) blood glucose control important in reducing the progression of atherosclerotic disease. Also, involved in wound healing. On appropriate medications.   Lower limb ulcer, calf, right, limited to breakdown of skin ([place]) With upcoming foot surgery and nonhealing ulcerations, noninvasive studies were performed today to evaluate his perfusion. We performed noninvasive studies today which showed normal ABIs with triphasic waveforms and normal digital pressures today consistent with no arterial insufficiency.  He has adequate blood flow for wound healing with his upcoming surgery and his ulcers.  No vascular intervention planned at this time.  Return as needed.      Leotis Pain 09/04/2019, 12:48 PM   This note was created with Dragon medical transcription system.  Any errors from dictation are unintentional.

## 2019-09-08 DIAGNOSIS — E113393 Type 2 diabetes mellitus with moderate nonproliferative diabetic retinopathy without macular edema, bilateral: Secondary | ICD-10-CM | POA: Diagnosis not present

## 2019-09-09 DIAGNOSIS — L97919 Non-pressure chronic ulcer of unspecified part of right lower leg with unspecified severity: Secondary | ICD-10-CM | POA: Diagnosis not present

## 2019-09-09 DIAGNOSIS — L97912 Non-pressure chronic ulcer of unspecified part of right lower leg with fat layer exposed: Secondary | ICD-10-CM | POA: Diagnosis not present

## 2019-09-09 DIAGNOSIS — I83019 Varicose veins of right lower extremity with ulcer of unspecified site: Secondary | ICD-10-CM | POA: Diagnosis not present

## 2019-09-09 DIAGNOSIS — I83891 Varicose veins of right lower extremities with other complications: Secondary | ICD-10-CM | POA: Diagnosis not present

## 2019-09-09 DIAGNOSIS — M722 Plantar fascial fibromatosis: Secondary | ICD-10-CM | POA: Diagnosis not present

## 2019-09-09 DIAGNOSIS — R609 Edema, unspecified: Secondary | ICD-10-CM | POA: Diagnosis not present

## 2019-09-16 DIAGNOSIS — L97919 Non-pressure chronic ulcer of unspecified part of right lower leg with unspecified severity: Secondary | ICD-10-CM | POA: Diagnosis not present

## 2019-09-16 DIAGNOSIS — R609 Edema, unspecified: Secondary | ICD-10-CM | POA: Diagnosis not present

## 2019-09-16 DIAGNOSIS — I83019 Varicose veins of right lower extremity with ulcer of unspecified site: Secondary | ICD-10-CM | POA: Diagnosis not present

## 2019-09-16 DIAGNOSIS — I83891 Varicose veins of right lower extremities with other complications: Secondary | ICD-10-CM | POA: Diagnosis not present

## 2019-09-23 DIAGNOSIS — L97919 Non-pressure chronic ulcer of unspecified part of right lower leg with unspecified severity: Secondary | ICD-10-CM | POA: Diagnosis not present

## 2019-09-23 DIAGNOSIS — I83019 Varicose veins of right lower extremity with ulcer of unspecified site: Secondary | ICD-10-CM | POA: Diagnosis not present

## 2019-09-23 DIAGNOSIS — R609 Edema, unspecified: Secondary | ICD-10-CM | POA: Diagnosis not present

## 2019-09-23 DIAGNOSIS — I83891 Varicose veins of right lower extremities with other complications: Secondary | ICD-10-CM | POA: Diagnosis not present

## 2019-09-24 DIAGNOSIS — Z859 Personal history of malignant neoplasm, unspecified: Secondary | ICD-10-CM | POA: Diagnosis not present

## 2019-09-24 DIAGNOSIS — L570 Actinic keratosis: Secondary | ICD-10-CM | POA: Diagnosis not present

## 2019-09-24 DIAGNOSIS — Z872 Personal history of diseases of the skin and subcutaneous tissue: Secondary | ICD-10-CM | POA: Diagnosis not present

## 2019-10-06 DIAGNOSIS — S86812A Strain of other muscle(s) and tendon(s) at lower leg level, left leg, initial encounter: Secondary | ICD-10-CM | POA: Diagnosis not present

## 2019-10-06 DIAGNOSIS — M25562 Pain in left knee: Secondary | ICD-10-CM | POA: Diagnosis not present

## 2019-10-07 DIAGNOSIS — I83891 Varicose veins of right lower extremities with other complications: Secondary | ICD-10-CM | POA: Diagnosis not present

## 2019-10-07 DIAGNOSIS — M79671 Pain in right foot: Secondary | ICD-10-CM | POA: Diagnosis not present

## 2019-10-07 DIAGNOSIS — E1142 Type 2 diabetes mellitus with diabetic polyneuropathy: Secondary | ICD-10-CM | POA: Diagnosis not present

## 2019-10-07 DIAGNOSIS — M216X2 Other acquired deformities of left foot: Secondary | ICD-10-CM | POA: Diagnosis not present

## 2019-10-07 DIAGNOSIS — M2012 Hallux valgus (acquired), left foot: Secondary | ICD-10-CM | POA: Diagnosis not present

## 2019-10-07 DIAGNOSIS — I83019 Varicose veins of right lower extremity with ulcer of unspecified site: Secondary | ICD-10-CM | POA: Diagnosis not present

## 2019-10-07 DIAGNOSIS — M2042 Other hammer toe(s) (acquired), left foot: Secondary | ICD-10-CM | POA: Diagnosis not present

## 2019-10-07 DIAGNOSIS — M216X1 Other acquired deformities of right foot: Secondary | ICD-10-CM | POA: Diagnosis not present

## 2019-10-07 DIAGNOSIS — I872 Venous insufficiency (chronic) (peripheral): Secondary | ICD-10-CM | POA: Diagnosis not present

## 2019-10-07 DIAGNOSIS — M722 Plantar fascial fibromatosis: Secondary | ICD-10-CM | POA: Diagnosis not present

## 2019-10-07 DIAGNOSIS — M2011 Hallux valgus (acquired), right foot: Secondary | ICD-10-CM | POA: Diagnosis not present

## 2019-10-07 DIAGNOSIS — L97912 Non-pressure chronic ulcer of unspecified part of right lower leg with fat layer exposed: Secondary | ICD-10-CM | POA: Diagnosis not present

## 2019-10-08 ENCOUNTER — Other Ambulatory Visit: Payer: Self-pay | Admitting: Podiatry

## 2019-10-22 ENCOUNTER — Encounter: Payer: Self-pay | Admitting: Podiatry

## 2019-10-22 ENCOUNTER — Other Ambulatory Visit: Payer: Self-pay

## 2019-10-27 ENCOUNTER — Other Ambulatory Visit: Payer: Self-pay

## 2019-10-27 ENCOUNTER — Other Ambulatory Visit
Admission: RE | Admit: 2019-10-27 | Discharge: 2019-10-27 | Disposition: A | Discharge status: Home / Self Care | Payer: PPO | Source: Ambulatory Visit | Attending: Podiatry | Admitting: Podiatry

## 2019-10-27 DIAGNOSIS — E7800 Pure hypercholesterolemia, unspecified: Secondary | ICD-10-CM | POA: Diagnosis not present

## 2019-10-27 DIAGNOSIS — I779 Disorder of arteries and arterioles, unspecified: Secondary | ICD-10-CM | POA: Diagnosis not present

## 2019-10-27 DIAGNOSIS — Z20822 Contact with and (suspected) exposure to covid-19: Secondary | ICD-10-CM | POA: Insufficient documentation

## 2019-10-27 DIAGNOSIS — I129 Hypertensive chronic kidney disease with stage 1 through stage 4 chronic kidney disease, or unspecified chronic kidney disease: Secondary | ICD-10-CM | POA: Diagnosis not present

## 2019-10-27 DIAGNOSIS — I10 Essential (primary) hypertension: Secondary | ICD-10-CM | POA: Diagnosis not present

## 2019-10-27 DIAGNOSIS — N183 Chronic kidney disease, stage 3 unspecified: Secondary | ICD-10-CM | POA: Diagnosis not present

## 2019-10-27 DIAGNOSIS — Z01812 Encounter for preprocedural laboratory examination: Secondary | ICD-10-CM | POA: Insufficient documentation

## 2019-10-27 DIAGNOSIS — E1122 Type 2 diabetes mellitus with diabetic chronic kidney disease: Secondary | ICD-10-CM | POA: Diagnosis not present

## 2019-10-27 LAB — SARS CORONAVIRUS 2 (TAT 6-24 HRS): SARS Coronavirus 2: NEGATIVE

## 2019-10-29 ENCOUNTER — Encounter
Admission: RE | Disposition: A | Discharge status: Home / Self Care | Payer: Self-pay | Source: Home / Self Care | Attending: Podiatry

## 2019-10-29 ENCOUNTER — Ambulatory Visit
Admission: RE | Admit: 2019-10-29 | Discharge: 2019-10-29 | Disposition: A | Discharge status: Home / Self Care | Payer: PPO | Attending: Podiatry | Admitting: Podiatry

## 2019-10-29 ENCOUNTER — Other Ambulatory Visit: Payer: Self-pay

## 2019-10-29 ENCOUNTER — Ambulatory Visit: Payer: PPO | Admitting: Anesthesiology

## 2019-10-29 ENCOUNTER — Encounter: Payer: Self-pay | Admitting: Podiatry

## 2019-10-29 DIAGNOSIS — Z8673 Personal history of transient ischemic attack (TIA), and cerebral infarction without residual deficits: Secondary | ICD-10-CM | POA: Insufficient documentation

## 2019-10-29 DIAGNOSIS — Z96643 Presence of artificial hip joint, bilateral: Secondary | ICD-10-CM | POA: Insufficient documentation

## 2019-10-29 DIAGNOSIS — M2042 Other hammer toe(s) (acquired), left foot: Secondary | ICD-10-CM | POA: Insufficient documentation

## 2019-10-29 DIAGNOSIS — M216X2 Other acquired deformities of left foot: Secondary | ICD-10-CM | POA: Diagnosis not present

## 2019-10-29 DIAGNOSIS — M216X1 Other acquired deformities of right foot: Secondary | ICD-10-CM | POA: Diagnosis not present

## 2019-10-29 DIAGNOSIS — M2012 Hallux valgus (acquired), left foot: Secondary | ICD-10-CM | POA: Diagnosis not present

## 2019-10-29 DIAGNOSIS — Z7901 Long term (current) use of anticoagulants: Secondary | ICD-10-CM | POA: Diagnosis not present

## 2019-10-29 DIAGNOSIS — Z87891 Personal history of nicotine dependence: Secondary | ICD-10-CM | POA: Insufficient documentation

## 2019-10-29 DIAGNOSIS — M2011 Hallux valgus (acquired), right foot: Secondary | ICD-10-CM | POA: Diagnosis not present

## 2019-10-29 DIAGNOSIS — Z791 Long term (current) use of non-steroidal anti-inflammatories (NSAID): Secondary | ICD-10-CM | POA: Insufficient documentation

## 2019-10-29 DIAGNOSIS — D2121 Benign neoplasm of connective and other soft tissue of right lower limb, including hip: Secondary | ICD-10-CM | POA: Insufficient documentation

## 2019-10-29 DIAGNOSIS — Z7902 Long term (current) use of antithrombotics/antiplatelets: Secondary | ICD-10-CM | POA: Diagnosis not present

## 2019-10-29 DIAGNOSIS — Z79899 Other long term (current) drug therapy: Secondary | ICD-10-CM | POA: Diagnosis not present

## 2019-10-29 DIAGNOSIS — Z86711 Personal history of pulmonary embolism: Secondary | ICD-10-CM | POA: Insufficient documentation

## 2019-10-29 DIAGNOSIS — Z7984 Long term (current) use of oral hypoglycemic drugs: Secondary | ICD-10-CM | POA: Diagnosis not present

## 2019-10-29 DIAGNOSIS — M722 Plantar fascial fibromatosis: Secondary | ICD-10-CM | POA: Diagnosis not present

## 2019-10-29 DIAGNOSIS — E1142 Type 2 diabetes mellitus with diabetic polyneuropathy: Secondary | ICD-10-CM | POA: Insufficient documentation

## 2019-10-29 DIAGNOSIS — M79671 Pain in right foot: Secondary | ICD-10-CM | POA: Diagnosis not present

## 2019-10-29 DIAGNOSIS — Z09 Encounter for follow-up examination after completed treatment for conditions other than malignant neoplasm: Secondary | ICD-10-CM

## 2019-10-29 HISTORY — DX: Presence of dental prosthetic device (complete) (partial): Z97.2

## 2019-10-29 HISTORY — PX: FASCIECTOMY: SHX6525

## 2019-10-29 LAB — GLUCOSE, CAPILLARY
Glucose-Capillary: 160 mg/dL — ABNORMAL HIGH (ref 70–99)
Glucose-Capillary: 144 mg/dL — ABNORMAL HIGH (ref 70–99)

## 2019-10-29 SURGERY — FASCIECTOMY, PALM
Anesthesia: General | Site: Foot | Laterality: Right

## 2019-10-29 MED ORDER — NUCYNTA 100 MG PO TABS: 1.0000 | ORAL_TABLET | Freq: Four times a day (QID) | ORAL | 0 refills | Status: AC | PRN

## 2019-10-29 MED ORDER — LACTATED RINGERS IV SOLN: INTRAVENOUS | Status: DC

## 2019-10-29 MED ORDER — LIDOCAINE HCL (CARDIAC) PF 100 MG/5ML IV SOSY
PREFILLED_SYRINGE | INTRAVENOUS | Status: DC | PRN
  Administered 2019-10-29: 30 mg via INTRATRACHEAL

## 2019-10-29 MED ORDER — PROPOFOL 10 MG/ML IV BOLUS
INTRAVENOUS | Status: DC | PRN
  Administered 2019-10-29: 150 mg via INTRAVENOUS

## 2019-10-29 MED ORDER — CEFAZOLIN SODIUM-DEXTROSE 2-4 GM/100ML-% IV SOLN
2.0000 g | INTRAVENOUS | Status: AC
  Administered 2019-10-29: 2 g via INTRAVENOUS

## 2019-10-29 MED ORDER — FENTANYL CITRATE (PF) 100 MCG/2ML IJ SOLN: 25.0000 ug | INTRAMUSCULAR | Status: DC | PRN

## 2019-10-29 MED ORDER — BUPIVACAINE LIPOSOME 1.3 % IJ SUSP
INTRAMUSCULAR | Status: DC | PRN
  Administered 2019-10-29: 20 mL

## 2019-10-29 MED ORDER — FENTANYL CITRATE (PF) 100 MCG/2ML IJ SOLN
INTRAMUSCULAR | Status: DC | PRN
  Administered 2019-10-29: 25 ug via INTRAVENOUS

## 2019-10-29 MED ORDER — ACETAMINOPHEN 160 MG/5ML PO SOLN: 325.0000 mg | ORAL | Status: DC | PRN

## 2019-10-29 MED ORDER — POVIDONE-IODINE 10 % EX SWAB
2.0000 "application " | Freq: Once | CUTANEOUS | Status: AC
  Administered 2019-10-29: 2 via TOPICAL

## 2019-10-29 MED ORDER — ONDANSETRON HCL 4 MG/2ML IJ SOLN
INTRAMUSCULAR | Status: DC | PRN
  Administered 2019-10-29: 4 mg via INTRAVENOUS

## 2019-10-29 MED ORDER — EPHEDRINE SULFATE 50 MG/ML IJ SOLN
INTRAMUSCULAR | Status: DC | PRN
  Administered 2019-10-29 (×3): 10 mg via INTRAVENOUS

## 2019-10-29 MED ORDER — ONDANSETRON HCL 4 MG PO TABS: 4.0000 mg | ORAL_TABLET | Freq: Three times a day (TID) | ORAL | 0 refills | Status: DC | PRN

## 2019-10-29 MED ORDER — ACETAMINOPHEN 325 MG PO TABS: 325.0000 mg | ORAL_TABLET | ORAL | Status: DC | PRN

## 2019-10-29 MED ORDER — ONDANSETRON HCL 4 MG/2ML IJ SOLN: 4.0000 mg | Freq: Once | INTRAMUSCULAR | Status: DC | PRN

## 2019-10-29 MED ORDER — DEXAMETHASONE SODIUM PHOSPHATE 4 MG/ML IJ SOLN
INTRAMUSCULAR | Status: DC | PRN
  Administered 2019-10-29: 4 mg via INTRAVENOUS

## 2019-10-29 MED ORDER — GLYCOPYRROLATE 0.2 MG/ML IJ SOLN
INTRAMUSCULAR | Status: DC | PRN
  Administered 2019-10-29: .1 mg via INTRAVENOUS

## 2019-10-29 MED ORDER — MIDAZOLAM HCL 5 MG/5ML IJ SOLN
INTRAMUSCULAR | Status: DC | PRN
  Administered 2019-10-29: 2 mg via INTRAVENOUS

## 2019-10-29 MED ORDER — CEPHALEXIN 500 MG PO CAPS: 500.0000 mg | ORAL_CAPSULE | Freq: Three times a day (TID) | ORAL | 0 refills | Status: AC

## 2019-10-29 SURGICAL SUPPLY — 25 items
BNDG CMPR STD VLCR NS LF 5.8X4 (GAUZE/BANDAGES/DRESSINGS) ×1
BNDG COHESIVE 4X5 TAN STRL (GAUZE/BANDAGES/DRESSINGS) ×3 IMPLANT
BNDG ELASTIC 4X5.8 VLCR NS LF (GAUZE/BANDAGES/DRESSINGS) ×2 IMPLANT
BNDG ESMARK 4X12 TAN STRL LF (GAUZE/BANDAGES/DRESSINGS) ×3 IMPLANT
BNDG GAUZE 4.5X4.1 6PLY STRL (MISCELLANEOUS) ×3 IMPLANT
COVER LIGHT HANDLE UNIVERSAL (MISCELLANEOUS) ×6 IMPLANT
CUFF TOURN SGL QUICK 18X4 (TOURNIQUET CUFF) ×2 IMPLANT
DURAPREP 26ML APPLICATOR (WOUND CARE) ×3 IMPLANT
ELECT REM PT RETURN 9FT ADLT (ELECTROSURGICAL) ×3
ELECTRODE REM PT RTRN 9FT ADLT (ELECTROSURGICAL) ×1 IMPLANT
GAUZE SPONGE 4X4 12PLY STRL (GAUZE/BANDAGES/DRESSINGS) ×3 IMPLANT
GAUZE XEROFORM 1X8 LF (GAUZE/BANDAGES/DRESSINGS) ×3 IMPLANT
GLOVE BIO SURGEON STRL SZ7 (GLOVE) ×5 IMPLANT
GLOVE BIOGEL PI IND STRL 7.0 (GLOVE) ×1 IMPLANT
GLOVE BIOGEL PI INDICATOR 7.0 (GLOVE) ×4
GOWN STRL REUS W/ TWL LRG LVL3 (GOWN DISPOSABLE) ×2 IMPLANT
GOWN STRL REUS W/TWL LRG LVL3 (GOWN DISPOSABLE) ×6
KIT TURNOVER KIT A (KITS) ×3 IMPLANT
NS IRRIG 500ML POUR BTL (IV SOLUTION) ×3 IMPLANT
PACK EXTREMITY ARMC (MISCELLANEOUS) ×3 IMPLANT
PADDING CAST BLEND 4X4 NS (MISCELLANEOUS) ×9 IMPLANT
STOCKINETTE IMPERVIOUS LG (DRAPES) ×3 IMPLANT
SUT ETHILON 3-0 (SUTURE) ×5 IMPLANT
SUT VIC AB 3-0 SH 27 (SUTURE) ×3
SUT VIC AB 3-0 SH 27X BRD (SUTURE) IMPLANT

## 2019-10-29 NOTE — Anesthesia Preprocedure Evaluation (Signed)
Anesthesia Evaluation  Patient identified by MRN, date of birth, ID band Patient awake    Reviewed: Allergy & Precautions, H&P , NPO status , Patient's Chart, lab work & pertinent test results  Airway Mallampati: II  TM Distance: >3 FB Neck ROM: full    Dental  (+) Edentulous Lower, Edentulous Upper   Pulmonary    Pulmonary exam normal breath sounds clear to auscultation       Cardiovascular hypertension, Normal cardiovascular exam Rhythm:regular Rate:Normal     Neuro/Psych CVA    GI/Hepatic GERD  ,  Endo/Other  diabetes  Renal/GU      Musculoskeletal   Abdominal   Peds  Hematology   Anesthesia Other Findings   Reproductive/Obstetrics                             Anesthesia Physical Anesthesia Plan  ASA: III  Anesthesia Plan: General LMA   Post-op Pain Management:    Induction:   PONV Risk Score and Plan: 2 and Treatment may vary due to age or medical condition, Ondansetron and Dexamethasone  Airway Management Planned:   Additional Equipment:   Intra-op Plan:   Post-operative Plan:   Informed Consent: I have reviewed the patients History and Physical, chart, labs and discussed the procedure including the risks, benefits and alternatives for the proposed anesthesia with the patient or authorized representative who has indicated his/her understanding and acceptance.     Dental Advisory Given  Plan Discussed with: CRNA  Anesthesia Plan Comments:         Anesthesia Quick Evaluation

## 2019-10-29 NOTE — Op Note (Signed)
PODIATRY / FOOT AND ANKLE SURGERY OPERATIVE REPORT    SURGEON: Caroline More, DPM  PRE-OPERATIVE DIAGNOSIS:  1. Right plantar fibroma  POST-OPERATIVE DIAGNOSIS: same  PROCEDURE(S): 1. Right plantar fibroma resection  HEMOSTASIS: Right ankle tourniquet  ANESTHESIA: MAC  ESTIMATED BLOOD LOSS: 10 cc  FINDING(S): 1.  Large plantar fibroma off medial band of the plantar fascia right  PATHOLOGY/SPECIMEN(S):  R plantar fibroma  INDICATIONS:   Michael Humphrey is a 69 y.o. male who presents with a painful lump to the plantar aspect of the right foot.  Patient has had a plantar fibroma on the left foot which she had resected with Dr. Elvina Mattes several years ago and like to the same thing performed on the right side as its become larger and more painful and difficult to walk on.  Patient does have a history of venous insufficiency and venous disease as well as pitting edema.  Patient has seen vascular surgery who states that patient has adequate blood flow for healing procedure.  Patient previously had a wound to the right shin which is now scabbed over.  Discussed all treatment options with patient of both conservative and surgical attempts at correction at this time patient has elected for surgical procedure consisting of right plantar fibroma resection.  Discussed postoperative course in detail with patient and need to be nonweightbearing for approximately 2 to 3 weeks until incision heals.  Discussed this also with patient's wife Michael Humphrey in detail.  DESCRIPTION: After obtaining full informed written consent, the patient was brought back to the operating room and placed supine upon the operating table.  The patient received IV antibiotics prior to induction.  After obtaining adequate anesthesia, the patient was prepped and draped in the standard fashion.  An Esmarch bandage was used to exsanguinate the right lower extremity and the pneumatic ankle tourniquet was inflated.  Attention was  then directed to the plantar medial aspect of the right arch in the area of the large plantar fibroma where a S-type curvilinear incision was made.  The incision was deepened to the subcutaneous tissues utilizing sharp and blunt dissection and care was taken to identify and retract all vital neurovascular structures and all venous contributories were cauterized as necessary.  Blunt dissection was then continued down to the plantar fascia and the plantar fascia was identified and isolated from the subcutaneous tissue.  The large nodule consistent with plantar fibroma was visualized at the operative site.  The plantar fibroma was measured and measured to be approximately 2.5 x 3 cm.  The plantar fibroma was then circumferentially dissected and resected and passed off the operative site with about a centimeter of healthy plantar fascia margin also being resected.  The area was palpated further after the resection was completed and did not appear to have any prominence noted to the site.  The plantar fascial ligament still remained fairly thick at this time that remained but did not have any notable disease or prominence consistent with plantar fibroma that remained.  The plantar fibroma was passed off the operative site and sent off to pathology.  The surgical site was flushed with copious amounts normal sterile saline.  The subcutaneous tissue was reapproximated well coapted with 3-0 Vicryl.  The skin was then reapproximated well coapted with combination of horizontal mattress and simple type stitching with 3-0 nylon.  20 cc of Exparel was then injected about the operative site.  A postoperative dressing was applied consisting of Xeroform followed by 4 x 4 gauze, ABD, Kerlix,  Ace wrap.  The pneumatic ankle tourniquet was deflated and a prompt hyperemic response was noted all digits of the right foot.    Patient tolerated the procedure and anesthesia well was transferred to the recovery room vital signs stable.  The  patient will be discharged home with the appropriate written and oral instructions and appropriate medications were sent into the pharmacy.  Patient to keep dressing clean, dry, and intact and again instructed patient and patient's wife Michael Humphrey maintaining nonweightbearing to the right lower extremity all times with use of surgical shoe.  Patient to restart blood thinner medications tomorrow 24 hours after surgery.  COMPLICATIONS: None  CONDITION: Good, stable  Caroline More, DPM

## 2019-10-29 NOTE — Anesthesia Procedure Notes (Signed)
Procedure Name: LMA Insertion Date/Time: 10/29/2019 7:40 AM Performed by: Cameron Ali, CRNA Pre-anesthesia Checklist: Patient identified, Emergency Drugs available, Suction available, Timeout performed and Patient being monitored Patient Re-evaluated:Patient Re-evaluated prior to induction Oxygen Delivery Method: Circle system utilized Preoxygenation: Pre-oxygenation with 100% oxygen Induction Type: IV induction LMA: LMA inserted LMA Size: 4.0 Number of attempts: 1 Placement Confirmation: positive ETCO2 and breath sounds checked- equal and bilateral Tube secured with: Tape Dental Injury: Teeth and Oropharynx as per pre-operative assessment

## 2019-10-29 NOTE — Anesthesia Postprocedure Evaluation (Signed)
Anesthesia Post Note  Patient: Michael Humphrey  Procedure(s) Performed: PLANTAR FIBROMA RESECTION RIGHT (Right Foot)     Patient location during evaluation: PACU Anesthesia Type: General Level of consciousness: awake and alert and oriented Pain management: satisfactory to patient Vital Signs Assessment: post-procedure vital signs reviewed and stable Respiratory status: spontaneous breathing, nonlabored ventilation and respiratory function stable Cardiovascular status: blood pressure returned to baseline and stable Postop Assessment: Adequate PO intake and No signs of nausea or vomiting Anesthetic complications: no   No complications documented.  Raliegh Ip

## 2019-10-29 NOTE — Transfer of Care (Signed)
Immediate Anesthesia Transfer of Care Note  Patient: Michael Humphrey  Procedure(s) Performed: PLANTAR FIBROMA RESECTION RIGHT (Right Foot)  Patient Location: PACU  Anesthesia Type: General LMA  Level of Consciousness: awake, alert  and patient cooperative  Airway and Oxygen Therapy: Patient Spontanous Breathing and Patient connected to supplemental oxygen  Post-op Assessment: Post-op Vital signs reviewed, Patient's Cardiovascular Status Stable, Respiratory Function Stable, Patent Airway and No signs of Nausea or vomiting  Post-op Vital Signs: Reviewed and stable  Complications: No complications documented.

## 2019-10-29 NOTE — Discharge Instructions (Signed)
[place] DR. TROXLER, DR. Vickki Muff, AND DR. Muscoy   1. Take your medication as prescribed.  Pain medication should be taken only as needed.  You may also take ibuprofen or Tylenol between pain medication doses.  2. Keep the dressing clean, dry and intact.  Do not remove dressing, keep on and clean at all times.  3. Keep your foot elevated above the heart level for the first 48 hours.  Continue elevation thereafter to reduce swelling further.  4. You should remain nonweightbearing for the most part at all times to right lower extremity.  Keep surgical shoe on also as it will be acting as a splint for your foot.  If you do put weight on your foot it puts you at increased chance for having surgical wound dehiscence meaning that your incision may open and cause the incision to delay healing which will prolong your postoperative course.  5. Please begin taking Eliquis medication and aspirin again 24 hours after procedure.  6. Do not take a shower. Baths are permissible as long as the foot is kept out of the water.   7. Every hour you are awake:  - Bend your knee 15 times. - Flex foot 15 times - Massage calf 15 times  8. Call [facility] [phone]) if any of the following problems occur: - You develop a temperature or fever. - The bandage becomes saturated with blood. - Medication does not stop your pain. - Injury of the foot occurs. - Any symptoms of infection including redness, odor, or red streaks running from wound.   General Anesthesia, Adult, Care After This sheet gives you information about how to care for yourself after your procedure. Your health care provider may also give you more specific instructions. If you have problems or questions, contact your health care provider. What can I expect after the procedure? After the procedure, the following side effects  are common:  Pain or discomfort at the IV site.  Nausea.  Vomiting.  Sore throat.  Trouble concentrating.  Feeling cold or chills.  Weak or tired.  Sleepiness and fatigue.  Soreness and body aches. These side effects can affect parts of the body that were not involved in surgery. Follow these instructions at home:  For at least 24 hours after the procedure:  Have a responsible adult stay with you. It is important to have someone help care for you until you are awake and alert.  Rest as needed.  Do not: ? Participate in activities in which you could fall or become injured. ? Drive. ? Use heavy machinery. ? Drink alcohol. ? Take sleeping pills or medicines that cause drowsiness. ? Make important decisions or sign legal documents. ? Take care of children on your own. Eating and drinking  Follow any instructions from your health care provider about eating or drinking restrictions.  When you feel hungry, start by eating small amounts of foods that are soft and easy to digest (bland), such as toast. Gradually return to your regular diet.  Drink enough fluid to keep your urine pale yellow.  If you vomit, rehydrate by drinking water, juice, or clear broth. General instructions  If you have sleep apnea, surgery and certain medicines can increase your risk for breathing problems. Follow instructions from your health care provider about wearing your sleep device: ? Anytime you are sleeping, including during daytime naps. ? While taking prescription pain medicines, sleeping  medicines, or medicines that make you drowsy.  Return to your normal activities as told by your health care provider. Ask your health care provider what activities are safe for you.  Take over-the-counter and prescription medicines only as told by your health care provider.  If you smoke, do not smoke without supervision.  Keep all follow-up visits as told by your health care provider. This is  important. Contact a health care provider if:  You have nausea or vomiting that does not get better with medicine.  You cannot eat or drink without vomiting.  You have pain that does not get better with medicine.  You are unable to pass urine.  You develop a skin rash.  You have a fever.  You have redness around your IV site that gets worse. Get help right away if:  You have difficulty breathing.  You have chest pain.  You have blood in your urine or stool, or you vomit blood. Summary  After the procedure, it is common to have a sore throat or nausea. It is also common to feel tired.  Have a responsible adult stay with you for the first 24 hours after general anesthesia. It is important to have someone help care for you until you are awake and alert.  When you feel hungry, start by eating small amounts of foods that are soft and easy to digest (bland), such as toast. Gradually return to your regular diet.  Drink enough fluid to keep your urine pale yellow.  Return to your normal activities as told by your health care provider. Ask your health care provider what activities are safe for you. This information is not intended to replace advice given to you by your health care provider. Make sure you discuss any questions you have with your health care provider. Document Revised: 12/21/2016 Document Reviewed: 08/03/2016 Elsevier Patient Education  2020 [place] for Discharge Teaching: EXPAREL (bupivacaine liposome injectable suspension)   Your surgeon or anesthesiologist gave you EXPAREL(bupivacaine) to help control your pain after surgery.   EXPAREL is a local anesthetic that provides pain relief by numbing the tissue around the surgical site.  EXPAREL is designed to release pain medication over time and can control pain for up to 72 hours.  Depending on how you respond to EXPAREL, you may require less pain medication during your recovery.  Possible  side effects:  Temporary loss of sensation or ability to move in the area where bupivacaine was injected.  Nausea, vomiting, constipation  Rarely, numbness and tingling in your mouth or lips, lightheadedness, or anxiety may occur.  Call your doctor right away if you think you may be experiencing any of these sensations, or if you have other questions regarding possible side effects.  Follow all other discharge instructions given to you by your surgeon or nurse. Eat a healthy diet and drink plenty of water or other fluids.  If you return to the hospital for any reason within 96 hours following the administration of EXPAREL, it is important for health care providers to know that you have received this anesthetic. A teal colored band has been placed on your arm with the date, time and amount of EXPAREL you have received in order to alert and inform your health care providers. Please leave this armband in place for the full 96 hours following administration, and then you may remove the band.

## 2019-10-29 NOTE — H&P (Signed)
HISTORY AND PHYSICAL INTERVAL NOTE:  10/29/2019  7:22 AM  Michael Humphrey  has presented today for surgery, with the diagnosis of M72.2  [place] M79.671  RIGHT FOOT PAIN.  The various methods of treatment have been discussed with the patient.  No guarantees were given.  After consideration of risks, benefits and other options for treatment, the patient has consented to surgery.  I have reviewed the patients' chart and labs.    PROCEDURE: RIGHT PLANTAR FIBROMA RESECTION  A history and physical examination was performed in my office.  The patient was reexamined.  There have been no changes to this history and physical examination.  Caroline More, DPM

## 2019-10-30 ENCOUNTER — Encounter: Payer: Self-pay | Admitting: Podiatry

## 2019-11-03 DIAGNOSIS — I10 Essential (primary) hypertension: Secondary | ICD-10-CM | POA: Diagnosis not present

## 2019-11-03 DIAGNOSIS — Z86711 Personal history of pulmonary embolism: Secondary | ICD-10-CM | POA: Diagnosis not present

## 2019-11-03 DIAGNOSIS — E113299 Type 2 diabetes mellitus with mild nonproliferative diabetic retinopathy without macular edema, unspecified eye: Secondary | ICD-10-CM | POA: Diagnosis not present

## 2019-11-03 DIAGNOSIS — E1122 Type 2 diabetes mellitus with diabetic chronic kidney disease: Secondary | ICD-10-CM | POA: Diagnosis not present

## 2019-11-03 DIAGNOSIS — G4733 Obstructive sleep apnea (adult) (pediatric): Secondary | ICD-10-CM | POA: Diagnosis not present

## 2019-11-03 DIAGNOSIS — E7800 Pure hypercholesterolemia, unspecified: Secondary | ICD-10-CM | POA: Diagnosis not present

## 2019-11-03 DIAGNOSIS — I129 Hypertensive chronic kidney disease with stage 1 through stage 4 chronic kidney disease, or unspecified chronic kidney disease: Secondary | ICD-10-CM | POA: Diagnosis not present

## 2019-11-03 DIAGNOSIS — Z6835 Body mass index (BMI) 35.0-35.9, adult: Secondary | ICD-10-CM | POA: Diagnosis not present

## 2019-11-03 DIAGNOSIS — M791 Myalgia, unspecified site: Secondary | ICD-10-CM | POA: Diagnosis not present

## 2019-11-03 DIAGNOSIS — T466X5A Adverse effect of antihyperlipidemic and antiarteriosclerotic drugs, initial encounter: Secondary | ICD-10-CM | POA: Diagnosis not present

## 2019-11-03 DIAGNOSIS — I779 Disorder of arteries and arterioles, unspecified: Secondary | ICD-10-CM | POA: Diagnosis not present

## 2019-11-30 ENCOUNTER — Ambulatory Visit (INDEPENDENT_AMBULATORY_CARE_PROVIDER_SITE_OTHER): Payer: PPO | Admitting: Ophthalmology

## 2019-11-30 ENCOUNTER — Encounter (INDEPENDENT_AMBULATORY_CARE_PROVIDER_SITE_OTHER): Payer: Self-pay | Admitting: Ophthalmology

## 2019-11-30 ENCOUNTER — Other Ambulatory Visit: Payer: Self-pay

## 2019-11-30 DIAGNOSIS — E113411 Type 2 diabetes mellitus with severe nonproliferative diabetic retinopathy with macular edema, right eye: Secondary | ICD-10-CM | POA: Diagnosis not present

## 2019-11-30 DIAGNOSIS — E113412 Type 2 diabetes mellitus with severe nonproliferative diabetic retinopathy with macular edema, left eye: Secondary | ICD-10-CM

## 2019-11-30 NOTE — Assessment & Plan Note (Signed)
OS improved status post focal laser, will observe

## 2019-11-30 NOTE — Progress Notes (Signed)
11/30/2019     CHIEF COMPLAINT Patient presents for Retina Follow Up   HISTORY OF PRESENT ILLNESS: Michael Humphrey is a 69 y.o. male who presents to the clinic today for:   HPI    Retina Follow Up    Patient presents with  Diabetic Retinopathy.  In both eyes.  This started 4 months ago.  Severity is mild.  Duration of 4 months.  Since onset it is stable.          Comments    4 MO F/U OU   Pt reports stable vision, no new F/F, no pain or pressure.        Last edited by Nichola Sizer D on 11/30/2019  8:06 AM. (History)      Referring physician: Kirk Ruths, MD [facility] [place],  [place] [id number]  HISTORICAL INFORMATION:   Selected notes from the MEDICAL RECORD NUMBER    Lab Results  Component Value Date   HGBA1C 7.1 (H) 07/15/2017     CURRENT MEDICATIONS: Current Outpatient Medications (Ophthalmic Drugs)  Medication Sig  . bacitracin-polymyxin b (POLYSPORIN) ophthalmic ointment  (Patient not taking: Reported on 10/22/2019)   No current facility-administered medications for this visit. (Ophthalmic Drugs)   Current Outpatient Medications (Other)  Medication Sig  . acetaminophen (TYLENOL) 500 MG tablet Take 1,000 mg by mouth every 6 (six) hours as needed for moderate pain or headache.  . ASPIRIN 81 PO Take by mouth daily.  . carvedilol (COREG) 12.5 MG tablet Take 12.5 mg by mouth 2 (two) times daily.   Mar[place] Kitchen ELIQUIS 5 MG TABS tablet Take 5 mg by mouth 2 (two) times daily.  . famciclovir (FAMVIR) 500 MG tablet Take 500 mg by mouth 3 (three) times daily. (Patient not taking: Reported on 10/22/2019)  . finasteride (PROSCAR) 5 MG tablet Take 5 mg by mouth every morning.   . gabapentin (NEURONTIN) 600 MG tablet Take 600 mg by mouth 2 (two) times daily. 1 TAB IN AM AND 2 AT NIGHT  . glimepiride (AMARYL) 2 MG tablet Take 2 mg by mouth daily with breakfast.  . lisinopril (PRINIVIL,ZESTRIL) 2.5 MG tablet Take 1 tablet  (2.5 mg total) by mouth daily. (Patient taking differently: Take 2.5 mg by mouth every morning. )  . metFORMIN (GLUCOPHAGE) 500 MG tablet Take 500 mg by mouth 2 (two) times daily.  . ondansetron (ZOFRAN) 4 MG tablet Take 1 tablet (4 mg total) by mouth every 8 (eight) hours as needed for nausea or vomiting.  . torsemide (DEMADEX) 20 MG tablet Take 20 mg by mouth daily as needed (swelling).  (Patient not taking: Reported on 10/22/2019)  . traZODone (DESYREL) 100 MG tablet Take 1 tablet (100 mg total) by mouth at bedtime as needed for sleep.   No current facility-administered medications for this visit. (Other)      REVIEW OF SYSTEMS:    ALLERGIES Allergies  Allergen Reactions  . Oxycodone     hallucinations  . Clonidine Derivatives     Hallucinations   . Fioricet [Butalbital-Apap-Caffeine]     hallucination  . Hydrocodone-Acetaminophen     hallucinations  . Mobic [Meloxicam]     hallucinations  . Norvasc [Amlodipine Besylate]     hallucinations  . Statins     Muscle cramps  . Tramadol Itching    PAST MEDICAL HISTORY Past Medical History:  Diagnosis Date  . Arthritis   . Cancer [facility])    SKIN CANCER  .  Diabetes mellitus without complication ([place])   . DVT (deep venous thrombosis) ([place]) 07/2017   nonocclusive DVT left internal jugular   . GERD (gastroesophageal reflux disease)    H/O  . High cholesterol   . Hypertension   . Lumbar radiculitis   . Stroke ([place])    07/2017  . Wears dentures    full upper and lower   Past Surgical History:  Procedure Laterality Date  . BUNIONECTOMY    . CATARACT EXTRACTION W/PHACO Left 12/11/2017   Procedure: CATARACT EXTRACTION PHACO AND INTRAOCULAR LENS PLACEMENT ([place])  LEFT DIABETIC;  Surgeon: Leandrew Koyanagi, MD;  Location: [place];  Service: Ophthalmology;  Laterality: Left;  DIABETIC ([facility] picking pt up at 0600, needs 0630 arrival time)  . CATARACT EXTRACTION W/PHACO Right 01/15/2018   Procedure: CATARACT  EXTRACTION PHACO AND INTRAOCULAR LENS PLACEMENT ([place])  RIGHT DIABETIC  leave so arrival will be around 7:45 ds;  Surgeon: Leandrew Koyanagi, MD;  Location: [place];  Service: Ophthalmology;  Laterality: Right;  Diabetic - oral meds  . CHOLECYSTECTOMY    . COLONOSCOPY    . COLONOSCOPY WITH PROPOFOL N/A 12/17/2014   Procedure: COLONOSCOPY WITH PROPOFOL;  Surgeon: Lollie Sails, MD;  Location: [facility] ENDOSCOPY;  Service: Endoscopy;  Laterality: N/A;  . correct hammertoe    . FASCIECTOMY Right 10/29/2019   Procedure: PLANTAR FIBROMA RESECTION RIGHT;  Surgeon: Caroline More, DPM;  Location: [place];  Service: Podiatry;  Laterality: Right;  Diabetic - oral meds  . JOINT REPLACEMENT    . KNEE ARTHROSCOPY Right   . LUMBAR LAMINECTOMY/ DECOMPRESSION WITH MET-RX N/A 02/18/2019   Procedure: L4-5 DECOMPRESSION;  Surgeon: Meade Maw, MD;  Location: [facility];  Service: Neurosurgery;  Laterality: N/A;  . TOTAL HIP ARTHROPLASTY    . TOTAL KNEE ARTHROPLASTY Right     FAMILY HISTORY Family History  Problem Relation Age of Onset  . Clotting disorder Father   . Hypertension Father     SOCIAL HISTORY Social History   Tobacco Use  . Smoking status: Never Smoker  . Smokeless tobacco: Former Systems developer    Types: Secondary school teacher  . Vaping Use: Never used  Substance Use Topics  . Alcohol use: Yes    Comment: RARE  . Drug use: No         OPHTHALMIC EXAM:  Base Eye Exam    Visual Acuity (ETDRS)      Right Left   Dist [place] 20/60 -1 20/40 -2   Dist ph [place] 20/50 -2 NI       Tonometry (Tonopen, 8:11 AM)      Right Left   Pressure 17 21  2x OS       Pupils      Pupils Dark Light Shape React APD   Right PERRL 4 3 Round Brisk None   Left PERRL 4 3 Round Brisk None       Visual Fields (Counting fingers)      Left Right    Full Full       Extraocular Movement      Right Left    Full Full       Neuro/Psych    Oriented x3: Yes   Mood/Affect: Normal        Dilation    Both eyes: 1.0% Mydriacyl, 2.5% Phenylephrine @ 8:12 AM        Slit Lamp and Fundus Exam    External Exam      Right Left  External Normal Normal       Slit Lamp Exam      Right Left   Lids/Lashes Hordeolum - lower lid, medially, 2+ Scurf, Stye, external Normal   Conjunctiva/Sclera White and quiet White and quiet   Cornea Clear Clear   Anterior Chamber Deep and quiet Deep and quiet   Iris Round and reactive Round and reactive   Lens Posterior chamber intraocular lens Posterior chamber intraocular lens   Anterior Vitreous Normal Normal       Fundus Exam      Right Left   Posterior Vitreous Normal Normal   Disc Normal Normal   C/D Ratio 0.0 0.0   Macula Macular thickening, Severe clinically significant macular edema, Exudates Microaneurysms, no clinically significant macular edema, no focal laser scars   Vessels NPDR-Severe NPDR-Severe   Periphery Normal Normal          IMAGING AND PROCEDURES  Imaging and Procedures for 11/30/19  Color Fundus Photography Optos - OU - Both Eyes       Right Eye Progression has worsened. Disc findings include normal observations. Macula : exudates, edema.   Left Eye Progression has been stable. Disc findings include normal observations. Macula : normal observations.   Notes Severe nonproliferative diabetic retinopathy OU  OD with hard exudates now into the macula and threatening the fovea with retinal thickening CSME.  OS good focal laser treatment  temporally                ASSESSMENT/PLAN:  Severe nonproliferative diabetic retinopathy of left eye, with macular edema, associated with type 2 diabetes mellitus (HCC) OS improved status post focal laser, will observe  Severe nonproliferative diabetic retinopathy of right eye, with macular edema, associated with type 2 diabetes mellitus (HCC) Thickening clinically as well as hard exudate continues OD temporally, will need to precertify and schedule  intravitreal Avastin OD soon      ICD-10-CM   1. Severe nonproliferative diabetic retinopathy of right eye, with macular edema, associated with type 2 diabetes mellitus (HCC)  N39.7673 Color Fundus Photography Optos - OU - Both Eyes    CANCELED: OCT, Retina - OU - Both Eyes  2. Severe nonproliferative diabetic retinopathy of left eye, with macular edema, associated with type 2 diabetes mellitus (HCC)  A19.3790 Color Fundus Photography Optos - OU - Both Eyes    1.  OD with decrease in distance vision will need treatment of CSME with antivegF.  We will plan in the coming week or 2 after certification with insurance 2.  May need focal laser treatment in the time thereafter  3.  OS remained stable, will observe  OD, dilate next, obtain OCT after dilation OD  Ophthalmic Meds Ordered this visit:  No orders of the defined types were placed in this encounter.      Return in about 1 week (around 12/07/2019) for OD, AVASTIN OCT, dilate prior to OCT.  There are no Patient Instructions on file for this visit.   Explained the diagnoses, plan, and follow up with the patient and they expressed understanding.  Patient expressed understanding of the importance of proper follow up care.   Clent Demark Jahon Bart M.D. Diseases & Surgery of the Retina and Vitreous Retina & Diabetic [place] 11/30/19     Abbreviations: M myopia (nearsighted); A astigmatism; H hyperopia (farsighted); P presbyopia; Mrx spectacle prescription;  CTL contact lenses; OD right eye; OS left eye; OU both eyes  XT exotropia; ET esotropia; PEK punctate epithelial keratitis; PEE  punctate epithelial erosions; DES dry eye syndrome; MGD meibomian gland dysfunction; ATs artificial tears; PFAT's preservative free artificial tears; [place] nuclear sclerotic cataract; PSC posterior subcapsular cataract; ERM epi-retinal membrane; PVD posterior vitreous detachment; RD retinal detachment; DM diabetes mellitus; DR diabetic retinopathy; NPDR  non-proliferative diabetic retinopathy; PDR proliferative diabetic retinopathy; CSME clinically significant macular edema; DME diabetic macular edema; dbh dot blot hemorrhages; CWS cotton wool spot; POAG primary open angle glaucoma; C/D cup-to-disc ratio; HVF humphrey visual field; GVF goldmann visual field; OCT optical coherence tomography; IOP intraocular pressure; BRVO Branch retinal vein occlusion; CRVO central retinal vein occlusion; CRAO central retinal artery occlusion; BRAO branch retinal artery occlusion; RT retinal tear; SB scleral buckle; PPV pars plana vitrectomy; VH Vitreous hemorrhage; PRP panretinal laser photocoagulation; IVK intravitreal kenalog; VMT vitreomacular traction; MH Macular hole;  NVD neovascularization of the disc; NVE neovascularization elsewhere; AREDS age related eye disease study; ARMD age related macular degeneration; POAG primary open angle glaucoma; EBMD epithelial/anterior basement membrane dystrophy; ACIOL anterior chamber intraocular lens; IOL intraocular lens; PCIOL posterior chamber intraocular lens; Phaco/IOL phacoemulsification with intraocular lens placement; [place] photorefractive keratectomy; LASIK laser assisted in situ keratomileusis; HTN hypertension; DM diabetes mellitus; COPD chronic obstructive pulmonary disease

## 2019-11-30 NOTE — Assessment & Plan Note (Signed)
Thickening clinically as well as hard exudate continues OD temporally, will need to precertify and schedule intravitreal Avastin OD soon

## 2019-12-09 ENCOUNTER — Ambulatory Visit (INDEPENDENT_AMBULATORY_CARE_PROVIDER_SITE_OTHER): Payer: PPO | Admitting: Ophthalmology

## 2019-12-09 ENCOUNTER — Encounter (INDEPENDENT_AMBULATORY_CARE_PROVIDER_SITE_OTHER): Payer: Self-pay | Admitting: Ophthalmology

## 2019-12-09 ENCOUNTER — Other Ambulatory Visit: Payer: Self-pay

## 2019-12-09 DIAGNOSIS — H00022 Hordeolum internum right lower eyelid: Secondary | ICD-10-CM

## 2019-12-09 DIAGNOSIS — E113411 Type 2 diabetes mellitus with severe nonproliferative diabetic retinopathy with macular edema, right eye: Secondary | ICD-10-CM | POA: Diagnosis not present

## 2019-12-09 HISTORY — DX: Hordeolum internum right lower eyelid: H00.022

## 2019-12-09 MED ORDER — BEVACIZUMAB 2.5 MG/0.1ML IZ SOSY
2.5000 mg | PREFILLED_SYRINGE | INTRAVITREAL | Status: AC | PRN
  Administered 2019-12-09: 2.5 mg via INTRAVITREAL

## 2019-12-09 NOTE — Patient Instructions (Signed)
Patient instructed to contact the office promptly for new onset visual acuity declines or distortions 

## 2019-12-09 NOTE — Assessment & Plan Note (Signed)
The nature of diabetic macular edema was discussed with the patient. Treatment options were outlined including medical therapy, laser & vitrectomy. The use of injectable medications reviewed, including Avastin, Lucentis, and Eylea. Periodic injections into the eye are likely to resolve diabetic macular edema (swelling in the center of vision). Initially, injections are delivered are delivered every 4-6 weeks, and the interval extended as the condition improves. On average, 8-9 injections the first year, and 5 in year 2. Improvement in the condition most often improves on medical therapy. Occasional use of focal laser is also recommended for residual macular edema (swelling). Excellent control of blood glucose and blood pressure are encouraged under the care of a primary physician or endocrinologist. Similarly, attempts to maintain serum cholesterol, low density lipoproteins, and high-density lipoproteins in a favorable range were recommended.

## 2019-12-09 NOTE — [place] Notes (Signed)
12/09/2019     CHIEF COMPLAINT Patient presents for Retina Follow Up   HISTORY OF PRESENT ILLNESS: Michael Humphrey is a 69 y.o. male who presents to the clinic today for:   HPI    Retina Follow Up    Patient presents with  Diabetic Retinopathy.  In right eye.  This started 1 week ago.  Severity is mild.  Duration of 1 week.          Comments    1 WK F/U OD, POSS AVASTIN OD   Pt reports dva still unstable, no new f/f, no pain or pressure.     Last BS: normal, taken Monday.       Last edited by Nichola Sizer D on 12/09/2019  8:31 AM. (History)      Referring physician: Kirk Ruths, MD [place] [facility] [place],  [place] [id number]  HISTORICAL INFORMATION:   Selected notes from the MEDICAL RECORD NUMBER    Lab Results  Component Value Date   HGBA1C 7.1 (H) 07/15/2017     CURRENT MEDICATIONS: Current Outpatient Medications (Ophthalmic Drugs)  Medication Sig  . bacitracin-polymyxin b (POLYSPORIN) ophthalmic ointment  (Patient not taking: Reported on 10/22/2019)   No current facility-administered medications for this visit. (Ophthalmic Drugs)   Current Outpatient Medications (Other)  Medication Sig  . acetaminophen (TYLENOL) 500 MG tablet Take 1,000 mg by mouth every 6 (six) hours as needed for moderate pain or headache.  . ASPIRIN 81 PO Take by mouth daily.  . carvedilol (COREG) 12.5 MG tablet Take 12.5 mg by mouth 2 (two) times daily.   Marland Kitchen ELIQUIS 5 MG TABS tablet Take 5 mg by mouth 2 (two) times daily.  . famciclovir (FAMVIR) 500 MG tablet Take 500 mg by mouth 3 (three) times daily. (Patient not taking: Reported on 10/22/2019)  . finasteride (PROSCAR) 5 MG tablet Take 5 mg by mouth every morning.   . gabapentin (NEURONTIN) 600 MG tablet Take 600 mg by mouth 2 (two) times daily. 1 TAB IN AM AND 2 AT NIGHT  . glimepiride (AMARYL) 2 MG tablet Take 2 mg by mouth daily with breakfast.  . lisinopril (PRINIVIL,ZESTRIL) 2.5  MG tablet Take 1 tablet (2.5 mg total) by mouth daily. (Patient taking differently: Take 2.5 mg by mouth every morning. )  . metFORMIN (GLUCOPHAGE) 500 MG tablet Take 500 mg by mouth 2 (two) times daily.  . ondansetron (ZOFRAN) 4 MG tablet Take 1 tablet (4 mg total) by mouth every 8 (eight) hours as needed for nausea or vomiting.  . torsemide (DEMADEX) 20 MG tablet Take 20 mg by mouth daily as needed (swelling).  (Patient not taking: Reported on 10/22/2019)  . traZODone (DESYREL) 100 MG tablet Take 1 tablet (100 mg total) by mouth at bedtime as needed for sleep.   No current facility-administered medications for this visit. (Other)      REVIEW OF SYSTEMS:    ALLERGIES Allergies  Allergen Reactions  . Oxycodone     hallucinations  . Clonidine Derivatives     Hallucinations   . Fioricet [Butalbital-Apap-Caffeine]     hallucination  . Hydrocodone-Acetaminophen     hallucinations  . Mobic [Meloxicam]     hallucinations  . Norvasc [Amlodipine Besylate]     hallucinations  . Statins     Muscle cramps  . Tramadol Itching    PAST MEDICAL HISTORY Past Medical History:  Diagnosis Date  . Arthritis   . Cancer ([place])  SKIN CANCER  . Diabetes mellitus without complication ([place])   . DVT (deep venous thrombosis) ([place]) 07/2017   nonocclusive DVT left internal jugular   . GERD (gastroesophageal reflux disease)    H/O  . High cholesterol   . Hypertension   . Lumbar radiculitis   . Stroke ([place])    07/2017  . Wears dentures    full upper and lower   Past Surgical History:  Procedure Laterality Date  . BUNIONECTOMY    . CATARACT EXTRACTION W/PHACO Left 12/11/2017   Procedure: CATARACT EXTRACTION PHACO AND INTRAOCULAR LENS PLACEMENT ([place])  LEFT DIABETIC;  Surgeon: Leandrew Koyanagi, MD;  Location: [place];  Service: Ophthalmology;  Laterality: Left;  DIABETIC ([facility]A picking pt up at 0600, needs 0630 arrival time)  . CATARACT EXTRACTION W/PHACO Right 01/15/2018    Procedure: CATARACT EXTRACTION PHACO AND INTRAOCULAR LENS PLACEMENT ([place])  RIGHT DIABETIC  leave so arrival will be around 7:45 ds;  Surgeon: Leandrew Koyanagi, MD;  Location: [place];  Service: Ophthalmology;  Laterality: Right;  Diabetic - oral meds  . CHOLECYSTECTOMY    . COLONOSCOPY    . COLONOSCOPY WITH PROPOFOL N/A 12/17/2014   Procedure: COLONOSCOPY WITH PROPOFOL;  Surgeon: Lollie Sails, MD;  Location: [facility] ENDOSCOPY;  Service: Endoscopy;  Laterality: N/A;  . correct hammertoe    . FASCIECTOMY Right 10/29/2019   Procedure: PLANTAR FIBROMA RESECTION RIGHT;  Surgeon: Caroline More, DPM;  Location: [place];  Service: Podiatry;  Laterality: Right;  Diabetic - oral meds  . JOINT REPLACEMENT    . KNEE ARTHROSCOPY Right   . LUMBAR LAMINECTOMY/ DECOMPRESSION WITH MET-RX N/A 02/18/2019   Procedure: L4-5 DECOMPRESSION;  Surgeon: Meade Maw, MD;  Location: [facility] ORS;  Service: Neurosurgery;  Laterality: N/A;  . TOTAL HIP ARTHROPLASTY    . TOTAL KNEE ARTHROPLASTY Right     FAMILY HISTORY Family History  Problem Relation Age of Onset  . Clotting disorder Father   . Hypertension Father     SOCIAL HISTORY Social History   Tobacco Use  . Smoking status: Never Smoker  . Smokeless tobacco: Former Systems developer    Types: Secondary school teacher  . Vaping Use: Never used  Substance Use Topics  . Alcohol use: Yes    Comment: RARE  . Drug use: No         OPHTHALMIC EXAM:  Base Eye Exam    Visual Acuity (ETDRS)      Right Left   Dist [place] 20/60 +2 20/40 -2   Dist ph [place] NI NI       Tonometry (Tonopen, 8:36 AM)      Right Left   Pressure _0 attempts OU, squinting and moving away       Pupils      Pupils Dark Light Shape React APD   Right PERRL 4 3 Round Brisk None   Left PERRL 4 3 Round Brisk None       Visual Fields (Counting fingers)      Left Right    Full        Extraocular Movement      Right Left    Full Full       Neuro/Psych     Oriented x3: Yes   Mood/Affect: Normal       Dilation    Right eye: 1.0% Mydriacyl, 2.5% Phenylephrine @ 8:37 AM        Slit Lamp and Fundus Exam    External  Exam      Right Left   External Normal Normal       Slit Lamp Exam      Right Left   Lids/Lashes Hordeolum - lower lid, medially, 2+ Scurf, Stye, external Normal   Conjunctiva/Sclera White and quiet White and quiet   Cornea Clear Clear   Anterior Chamber Deep and quiet Deep and quiet   Iris Round and reactive Round and reactive   Lens Posterior chamber intraocular lens Posterior chamber intraocular lens   Anterior Vitreous Normal Normal       Fundus Exam      Right Left   Posterior Vitreous Normal    Disc Normal    C/D Ratio 0.0    Macula Macular thickening temporally, Severe clinically significant macular edema, Exudates in a circinate ring temporally., Microaneurysms    Vessels NPDR-Severe    Periphery Normal           IMAGING AND PROCEDURES  Imaging and Procedures for 12/09/19  OCT, Retina - OU - Both Eyes       Right Eye Quality was good. Scan locations included subfoveal. Progression has been stable. Findings include cystoid macular edema.   Left Eye Quality was good. Progression has been stable.   Notes Macular thickening OD temporal to the fovea, will need Intravitreal Avastin OD today and examination repeated in 4 to 5 weeks       Intravitreal Injection, Pharmacologic Agent - OD - Right Eye       Time Out 12/09/2019. 9:46 AM. Confirmed correct patient, procedure, site, and patient consented.   Anesthesia Topical anesthesia was used. Anesthetic medications included [place] 3.5%.   Procedure Preparation included Tobramycin 0.3%, 10% betadine to eyelids. A 30 gauge needle was used.   Injection:  2.5 mg Bevacizumab (AVASTIN) 2.15m/0.1mL SOSY   NDC: 744975-300-51 Lot:: [id number]  Route: Intravitreal, Site: Right Eye  Post-op Post injection exam found visual acuity of at least counting  fingers. The patient tolerated the procedure well. There were no complications. The patient received written and verbal post procedure care education. Post injection medications were not given.                 ASSESSMENT/PLAN:  Severe nonproliferative diabetic retinopathy of right eye, with macular edema, associated with type 2 diabetes mellitus ([place]  The nature of diabetic macular edema was discussed with the patient. Treatment options were outlined including medical therapy, laser & vitrectomy. The use of injectable medications reviewed, including Avastin, Lucentis, and Eylea. Periodic injections into the eye are likely to resolve diabetic macular edema (swelling in the center of vision). Initially, injections are delivered are delivered every 4-6 weeks, and the interval extended as the condition improves. On average, 8-9 injections the first year, and 5 in year 2. Improvement in the condition most often improves on medical therapy. Occasional use of focal laser is also recommended for residual macular edema (swelling). Excellent control of blood glucose and blood pressure are encouraged under the care of a primary physician or endocrinologist. Similarly, attempts to maintain serum cholesterol, low density lipoproteins, and high-density lipoproteins in a favorable range were recommended.   Hordeolum internum right lower eyelid Improved and resolved since last visit some 9 days previous      ICD-10-CM   1. Severe nonproliferative diabetic retinopathy of right eye, with macular edema, associated with type 2 diabetes mellitus (HCC)  E11.3411 OCT, Retina - OU - Both Eyes    Intravitreal Injection, Pharmacologic Agent - OD -  Right Eye    bevacizumab (AVASTIN) SOSY 2.5 mg  2. Hordeolum internum right lower eyelid  H00.022     1.  We will repeat injection intravitreal Avastin today, at some 5 months post most recent, will try to resolve the CSME to allow for focal laser treatment to the  residual focal leakages temporal to the fovea OD  2.  OS without CSME today  3.  Ophthalmic Meds Ordered this visit:  Meds ordered this encounter  Medications  . bevacizumab (AVASTIN) SOSY 2.5 mg       Return in about 5 weeks (around 01/13/2020) for dilate, OD, AVASTIN OCT.  Patient Instructions  Patient instructed to contact the office promptly for new onset visual acuity declines or distortions    Explained the diagnoses, plan, and follow up with the patient and they expressed understanding.  Patient expressed understanding of the importance of proper follow up care.   Clent Demark Dayami Taitt M.D. Diseases & Surgery of the Retina and Vitreous Retina & Diabetic [place] 12/09/19     Abbreviations: M myopia (nearsighted); A astigmatism; H hyperopia (farsighted); P presbyopia; Mrx spectacle prescription;  CTL contact lenses; OD right eye; OS left eye; OU both eyes  XT exotropia; ET esotropia; PEK punctate epithelial keratitis; PEE punctate epithelial erosions; DES dry eye syndrome; MGD meibomian gland dysfunction; ATs artificial tears; PFAT's preservative free artificial tears; [employer] nuclear sclerotic cataract; PSC posterior subcapsular cataract; ERM epi-retinal membrane; PVD posterior vitreous detachment; RD retinal detachment; DM diabetes mellitus; DR diabetic retinopathy; NPDR non-proliferative diabetic retinopathy; PDR proliferative diabetic retinopathy; CSME clinically significant macular edema; DME diabetic macular edema; dbh dot blot hemorrhages; CWS cotton wool spot; POAG primary open angle glaucoma; C/D cup-to-disc ratio; HVF humphrey visual field; GVF goldmann visual field; OCT optical coherence tomography; IOP intraocular pressure; BRVO Branch retinal vein occlusion; CRVO central retinal vein occlusion; CRAO central retinal artery occlusion; BRAO branch retinal artery occlusion; RT retinal tear; SB scleral buckle; PPV pars plana vitrectomy; VH Vitreous hemorrhage; PRP panretinal laser  photocoagulation; IVK intravitreal kenalog; VMT vitreomacular traction; MH Macular hole;  NVD neovascularization of the disc; NVE neovascularization elsewhere; AREDS age related eye disease study; ARMD age related macular degeneration; POAG primary open angle glaucoma; EBMD epithelial/anterior basement membrane dystrophy; ACIOL anterior chamber intraocular lens; IOL intraocular lens; PCIOL posterior chamber intraocular lens; Phaco/IOL phacoemulsification with intraocular lens placement; [place] photorefractive keratectomy; LASIK laser assisted in situ keratomileusis; HTN hypertension; DM diabetes mellitus; COPD chronic obstructive pulmonary disease

## 2019-12-09 NOTE — Assessment & Plan Note (Signed)
Improved and resolved since last visit some 9 days previous

## 2019-12-10 ENCOUNTER — Ambulatory Visit (INDEPENDENT_AMBULATORY_CARE_PROVIDER_SITE_OTHER): Payer: PPO | Admitting: Ophthalmology

## 2019-12-10 ENCOUNTER — Encounter (INDEPENDENT_AMBULATORY_CARE_PROVIDER_SITE_OTHER): Payer: Self-pay | Admitting: Ophthalmology

## 2019-12-10 DIAGNOSIS — H33321 Round hole, right eye: Secondary | ICD-10-CM | POA: Diagnosis not present

## 2019-12-10 DIAGNOSIS — E113411 Type 2 diabetes mellitus with severe nonproliferative diabetic retinopathy with macular edema, right eye: Secondary | ICD-10-CM

## 2019-12-10 NOTE — [place] Notes (Signed)
12/10/2019     CHIEF COMPLAINT Patient presents for Spots and/or Floaters   HISTORY OF PRESENT ILLNESS: Michael Humphrey is a 69 y.o. male who presents to the clinic today for:   HPI    Spots and/or Floaters    In right eye.  Characterized as large.  Duration of 1 day.  Since onset it is stable.  I, the attending physician,  performed the HPI with the patient and updated documentation appropriately.          Comments    1 Day s\p Avastin inj OD  Pt states after inj he saw two large floaters in OD. Pt states today the floaters have combined into 1       Last edited by Tilda Franco on 12/10/2019  1:09 PM. (History)      Referring physician: Kirk Ruths, MD [facility] [place],  [place] [address]  HISTORICAL INFORMATION:   Selected notes from the MEDICAL RECORD NUMBER    Lab Results  Component Value Date   HGBA1C 7.1 (H) 07/15/2017     CURRENT MEDICATIONS: Current Outpatient Medications (Ophthalmic Drugs)  Medication Sig  . bacitracin-polymyxin b (POLYSPORIN) ophthalmic ointment  (Patient not taking: Reported on 10/22/2019)   No current facility-administered medications for this visit. (Ophthalmic Drugs)   Current Outpatient Medications (Other)  Medication Sig  . acetaminophen (TYLENOL) 500 MG tablet Take 1,000 mg by mouth every 6 (six) hours as needed for moderate pain or headache.  . ASPIRIN 81 PO Take by mouth daily.  . carvedilol (COREG) 12.5 MG tablet Take 12.5 mg by mouth 2 (two) times daily.   Marland Kitchen ELIQUIS 5 MG TABS tablet Take 5 mg by mouth 2 (two) times daily.  . famciclovir (FAMVIR) 500 MG tablet Take 500 mg by mouth 3 (three) times daily. (Patient not taking: Reported on 10/22/2019)  . finasteride (PROSCAR) 5 MG tablet Take 5 mg by mouth every morning.   . gabapentin (NEURONTIN) 600 MG tablet Take 600 mg by mouth 2 (two) times daily. 1 TAB IN AM AND 2 AT NIGHT  . glimepiride (AMARYL) 2 MG tablet Take 2 mg  by mouth daily with breakfast.  . lisinopril (PRINIVIL,ZESTRIL) 2.5 MG tablet Take 1 tablet (2.5 mg total) by mouth daily. (Patient taking differently: Take 2.5 mg by mouth every morning. )  . metFORMIN (GLUCOPHAGE) 500 MG tablet Take 500 mg by mouth 2 (two) times daily.  . ondansetron (ZOFRAN) 4 MG tablet Take 1 tablet (4 mg total) by mouth every 8 (eight) hours as needed for nausea or vomiting.  . torsemide (DEMADEX) 20 MG tablet Take 20 mg by mouth daily as needed (swelling).  (Patient not taking: Reported on 10/22/2019)  . traZODone (DESYREL) 100 MG tablet Take 1 tablet (100 mg total) by mouth at bedtime as needed for sleep.   No current facility-administered medications for this visit. (Other)      REVIEW OF SYSTEMS:    ALLERGIES Allergies  Allergen Reactions  . Oxycodone     hallucinations  . Clonidine Derivatives     Hallucinations   . Fioricet [Butalbital-Apap-Caffeine]     hallucination  . Hydrocodone-Acetaminophen     hallucinations  . Mobic [Meloxicam]     hallucinations  . Norvasc [Amlodipine Besylate]     hallucinations  . Statins     Muscle cramps  . Tramadol Itching    PAST MEDICAL HISTORY Past Medical History:  Diagnosis Date  .  Arthritis   . Cancer [facility])    SKIN CANCER  . Diabetes mellitus without complication ([place])   . DVT (deep venous thrombosis) ([place]) 07/2017   nonocclusive DVT left internal jugular   . GERD (gastroesophageal reflux disease)    H/O  . High cholesterol   . Hypertension   . Lumbar radiculitis   . Stroke ([place])    07/2017  . Wears dentures    full upper and lower   Past Surgical History:  Procedure Laterality Date  . BUNIONECTOMY    . CATARACT EXTRACTION W/PHACO Left 12/11/2017   Procedure: CATARACT EXTRACTION PHACO AND INTRAOCULAR LENS PLACEMENT ([place])  LEFT DIABETIC;  Surgeon: Leandrew Koyanagi, MD;  Location: [place];  Service: Ophthalmology;  Laterality: Left;  DIABETIC ([facility] picking pt up at 0600, needs  0630 arrival time)  . CATARACT EXTRACTION W/PHACO Right 01/15/2018   Procedure: CATARACT EXTRACTION PHACO AND INTRAOCULAR LENS PLACEMENT ([place])  RIGHT DIABETIC  leave so arrival will be around 7:45 ds;  Surgeon: Leandrew Koyanagi, MD;  Location: [place];  Service: Ophthalmology;  Laterality: Right;  Diabetic - oral meds  . CHOLECYSTECTOMY    . COLONOSCOPY    . COLONOSCOPY WITH PROPOFOL N/A 12/17/2014   Procedure: COLONOSCOPY WITH PROPOFOL;  Surgeon: Lollie Sails, MD;  Location: [facility] ENDOSCOPY;  Service: Endoscopy;  Laterality: N/A;  . correct hammertoe    . FASCIECTOMY Right 10/29/2019   Procedure: PLANTAR FIBROMA RESECTION RIGHT;  Surgeon: Caroline More, DPM;  Location: [place];  Service: Podiatry;  Laterality: Right;  Diabetic - oral meds  . JOINT REPLACEMENT    . KNEE ARTHROSCOPY Right   . LUMBAR LAMINECTOMY/ DECOMPRESSION WITH MET-RX N/A 02/18/2019   Procedure: L4-5 DECOMPRESSION;  Surgeon: Meade Maw, MD;  Location: [facility] ORS;  Service: Neurosurgery;  Laterality: N/A;  . TOTAL HIP ARTHROPLASTY    . TOTAL KNEE ARTHROPLASTY Right     FAMILY HISTORY Family History  Problem Relation Age of Onset  . Clotting disorder Father   . Hypertension Father     SOCIAL HISTORY Social History   Tobacco Use  . Smoking status: Never Smoker  . Smokeless tobacco: Former Systems developer    Types: Secondary school teacher  . Vaping Use: Never used  Substance Use Topics  . Alcohol use: Yes    Comment: RARE  . Drug use: No         OPHTHALMIC EXAM: Base Eye Exam    Visual Acuity (Snellen - Linear)      Right Left   Dist [place] 20/60 -2 20/40   Dist ph [place] 20/50 20/30       Tonometry (Tonopen, 1:12 PM)      Right Left   Pressure 16 19       Neuro/Psych    Oriented x3: Yes   Mood/Affect: Normal       Dilation    Right eye: 1.0% Mydriacyl, 2.5% Phenylephrine @ 1:12 PM        Slit Lamp and Fundus Exam    External Exam      Right Left   External Normal Normal        Slit Lamp Exam      Right Left   Lids/Lashes Hordeolum - lower lid, medially, 2+ Scurf, Stye, external Normal   Conjunctiva/Sclera White and quiet White and quiet   Cornea Clear Clear   Anterior Chamber Deep and quiet Deep and quiet   Iris Round and reactive Round and reactive  Lens Posterior chamber intraocular lens Posterior chamber intraocular lens   Anterior Vitreous Normal Normal       Fundus Exam      Right Left   Posterior Vitreous Normal    Disc Normal    C/D Ratio 0.0    Macula Macular thickening temporally, Severe clinically significant macular edema, Exudates in a circinate ring temporally., Microaneurysms    Vessels NPDR-Severe    Periphery Normal           IMAGING AND PROCEDURES  Imaging and Procedures for 12/10/19  Color Fundus Photography Optos - OU - Both Eyes       Right Eye Progression has worsened. Disc findings include normal observations. Macula : exudates, microaneurysms. Periphery : hole.   Notes OU with moderate to severe nonproliferative diabetic retinopathy, no views today OS   Dental note OD with operculated retinal tear 10:00 meridian,  No sign of the small air bubble seen on color fundus photography that is seen clinically or by patient symptomatically       Repair Retinal Breaks, Laser - OD - Right Eye       Tear locations include temporal.   Time Out Confirmed correct patient, procedure, site, and patient consented.   Anesthesia Topical anesthesia was used. Anesthetic medications included Proparacaine 0.5%.   Laser Information The type of laser was diode. Color was yellow. The duration in seconds was 0.03. The spot size was 390 microns. Laser power was 300. Total spots was 61.   Post-op The patient tolerated the procedure well. There were no complications. The patient received written and verbal post procedure care education.   Notes Navigated laser not able to be performed at break, 90 % treated surround of break  with arc patterns.  Poor pt cooperation.                ASSESSMENT/PLAN:  Peripheral retinal hole, right Found today on color fundus photography, confirmed on exam, 10:00 meridian temporally operculated tear      ICD-10-CM   1. Peripheral retinal hole, right  H33.321 Repair Retinal Breaks, Laser - OD - Right Eye  2. Severe nonproliferative diabetic retinopathy of right eye, with macular edema, associated with type 2 diabetes mellitus (HCC)  O16.0737 Color Fundus Photography Optos - OU - Both Eyes    CANCELED: OCT, Retina - OU - Both Eyes    1.  Patient reassured that a small air bubble in the right eye will clear up within 2 to 3 days.  This is not a complication but simply air from the needle expression of the recent intravitreal pharmacological agent injection  2.  Incidental note was found of operculated retinal tear at 10:00 meridian not seen on prior examinations.  Thus laser retinopexy was offered.  3.  Navigated laser attempted, patient cooperation and poor visualization because of patient positioning issues thus 90% retinopexy coverage was accomplished.  Nonetheless there are smaller atrophic holes superior to this region that require laser treatment that was not able to complete today  Plan follow-up next visit dilate OD and likely completion of laser retinopexy at that time  Ophthalmic Meds Ordered this visit:  No orders of the defined types were placed in this encounter.      Return for As scheduled,  , COLOR FP, OD, RETINOPEXY complete.  There are no Patient Instructions on file for this visit.   Explained the diagnoses, plan, and follow up with the patient and they expressed understanding.  Patient expressed understanding of  the importance of proper follow up care.   Clent Demark Jahmar Mckelvy M.D. Diseases & Surgery of the Retina and Vitreous Retina & Diabetic [place] 12/10/19     Abbreviations: M myopia (nearsighted); A astigmatism; H hyperopia  (farsighted); P presbyopia; Mrx spectacle prescription;  CTL contact lenses; OD right eye; OS left eye; OU both eyes  XT exotropia; ET esotropia; PEK punctate epithelial keratitis; PEE punctate epithelial erosions; DES dry eye syndrome; MGD meibomian gland dysfunction; ATs artificial tears; PFAT's preservative free artificial tears; [place] nuclear sclerotic cataract; PSC posterior subcapsular cataract; ERM epi-retinal membrane; PVD posterior vitreous detachment; RD retinal detachment; DM diabetes mellitus; DR diabetic retinopathy; NPDR non-proliferative diabetic retinopathy; PDR proliferative diabetic retinopathy; CSME clinically significant macular edema; DME diabetic macular edema; dbh dot blot hemorrhages; CWS cotton wool spot; POAG primary open angle glaucoma; C/D cup-to-disc ratio; HVF humphrey visual field; GVF goldmann visual field; OCT optical coherence tomography; IOP intraocular pressure; BRVO Branch retinal vein occlusion; CRVO central retinal vein occlusion; CRAO central retinal artery occlusion; BRAO branch retinal artery occlusion; RT retinal tear; SB scleral buckle; PPV pars plana vitrectomy; VH Vitreous hemorrhage; PRP panretinal laser photocoagulation; IVK intravitreal kenalog; VMT vitreomacular traction; MH Macular hole;  NVD neovascularization of the disc; NVE neovascularization elsewhere; AREDS age related eye disease study; ARMD age related macular degeneration; POAG primary open angle glaucoma; EBMD epithelial/anterior basement membrane dystrophy; ACIOL anterior chamber intraocular lens; IOL intraocular lens; PCIOL posterior chamber intraocular lens; Phaco/IOL phacoemulsification with intraocular lens placement; [place] photorefractive keratectomy; LASIK laser assisted in situ keratomileusis; HTN hypertension; DM diabetes mellitus; COPD chronic obstructive pulmonary disease

## 2019-12-10 NOTE — Assessment & Plan Note (Signed)
Found today on color fundus photography, confirmed on exam, 10:00 meridian temporally operculated tear

## 2019-12-21 DIAGNOSIS — L97511 Non-pressure chronic ulcer of other part of right foot limited to breakdown of skin: Secondary | ICD-10-CM | POA: Diagnosis not present

## 2019-12-21 DIAGNOSIS — L853 Xerosis cutis: Secondary | ICD-10-CM | POA: Diagnosis not present

## 2020-01-04 DIAGNOSIS — L03032 Cellulitis of left toe: Secondary | ICD-10-CM | POA: Diagnosis not present

## 2020-01-04 DIAGNOSIS — L03116 Cellulitis of left lower limb: Secondary | ICD-10-CM | POA: Diagnosis not present

## 2020-01-05 DIAGNOSIS — L97521 Non-pressure chronic ulcer of other part of left foot limited to breakdown of skin: Secondary | ICD-10-CM | POA: Diagnosis not present

## 2020-01-05 DIAGNOSIS — L02612 Cutaneous abscess of left foot: Secondary | ICD-10-CM | POA: Diagnosis not present

## 2020-01-05 DIAGNOSIS — E1142 Type 2 diabetes mellitus with diabetic polyneuropathy: Secondary | ICD-10-CM | POA: Diagnosis not present

## 2020-01-05 DIAGNOSIS — L97511 Non-pressure chronic ulcer of other part of right foot limited to breakdown of skin: Secondary | ICD-10-CM | POA: Diagnosis not present

## 2020-01-05 DIAGNOSIS — L03032 Cellulitis of left toe: Secondary | ICD-10-CM | POA: Diagnosis not present

## 2020-01-11 ENCOUNTER — Other Ambulatory Visit: Payer: Self-pay | Admitting: Podiatry

## 2020-01-11 DIAGNOSIS — M86172 Other acute osteomyelitis, left ankle and foot: Secondary | ICD-10-CM | POA: Diagnosis not present

## 2020-01-11 DIAGNOSIS — L97521 Non-pressure chronic ulcer of other part of left foot limited to breakdown of skin: Secondary | ICD-10-CM | POA: Diagnosis not present

## 2020-01-11 DIAGNOSIS — E1142 Type 2 diabetes mellitus with diabetic polyneuropathy: Secondary | ICD-10-CM | POA: Diagnosis not present

## 2020-01-12 ENCOUNTER — Encounter (INDEPENDENT_AMBULATORY_CARE_PROVIDER_SITE_OTHER): Payer: Self-pay | Admitting: Ophthalmology

## 2020-01-12 ENCOUNTER — Ambulatory Visit (INDEPENDENT_AMBULATORY_CARE_PROVIDER_SITE_OTHER): Payer: PPO | Admitting: Ophthalmology

## 2020-01-12 ENCOUNTER — Other Ambulatory Visit: Payer: Self-pay

## 2020-01-12 DIAGNOSIS — H00022 Hordeolum internum right lower eyelid: Secondary | ICD-10-CM

## 2020-01-12 DIAGNOSIS — E113411 Type 2 diabetes mellitus with severe nonproliferative diabetic retinopathy with macular edema, right eye: Secondary | ICD-10-CM | POA: Diagnosis not present

## 2020-01-12 DIAGNOSIS — H33321 Round hole, right eye: Secondary | ICD-10-CM

## 2020-01-12 DIAGNOSIS — E113412 Type 2 diabetes mellitus with severe nonproliferative diabetic retinopathy with macular edema, left eye: Secondary | ICD-10-CM | POA: Diagnosis not present

## 2020-01-12 MED ORDER — BEVACIZUMAB 2.5 MG/0.1ML IZ SOSY
2.5000 mg | PREFILLED_SYRINGE | INTRAVITREAL | Status: AC | PRN
  Administered 2020-01-12: 2.5 mg via INTRAVITREAL

## 2020-01-12 NOTE — Assessment & Plan Note (Signed)
Laser retinopexy to tear at 10 meridian, 1 month postop

## 2020-01-12 NOTE — Assessment & Plan Note (Signed)
The nature of diabetic macular edema was discussed with the patient. Treatment options were outlined including medical therapy, laser & vitrectomy. The use of injectable medications reviewed, including Avastin, Lucentis, and Eylea. Periodic injections into the eye are likely to resolve diabetic macular edema (swelling in the center of vision). Initially, injections are delivered are delivered every 4-6 weeks, and the interval extended as the condition improves. On average, 8-9 injections the first year, and 5 in year 2. Improvement in the condition most often improves on medical therapy. Occasional use of focal laser is also recommended for residual macular edema (swelling). Excellent control of blood glucose and blood pressure are encouraged under the care of a primary physician or endocrinologist. Similarly, attempts to maintain serum cholesterol, low density lipoproteins, and high-density lipoproteins in a favorable range were recommended.   OD status post Avastin No. 1, still active. Once CSME temporal to the fovea has subsided on therapy, will deliver focal treatment for longstanding resolution

## 2020-01-12 NOTE — Assessment & Plan Note (Signed)
Resolved from prior exam

## 2020-01-12 NOTE — Assessment & Plan Note (Signed)
No active CSME OS today

## 2020-01-12 NOTE — Progress Notes (Signed)
01/12/2020     CHIEF COMPLAINT Patient presents for Retina Follow Up (5 WK FU OD, POSS AVASTIN OD////Pt reports trouble with long distances, no new F/F OD, no pain or pressure OD./////Last A1C: unsure but normal, was taken off meds////Last BS: around 115 taken 2 days ago )   HISTORY OF PRESENT ILLNESS: Michael Humphrey is a 70 y.o. male who presents to the clinic today for:   HPI    Retina Follow Up    Patient presents with  Diabetic Retinopathy.  In right eye.  This started 5 weeks ago.  Severity is mild.  Duration of 5 weeks.  Since onset it is stable. Additional comments: 5 WK FU OD, POSS AVASTIN OD    Pt reports trouble with long distances, no new F/F OD, no pain or pressure OD.     Last A1C: unsure but normal, was taken off meds    Last BS: around 115 taken 2 days ago        Last edited by Nichola Sizer D on 01/12/2020  8:08 AM. (History)      Referring physician: Kirk Ruths, MD [facility] [place],  [place] [address]  HISTORICAL INFORMATION:   Selected notes from the MEDICAL RECORD NUMBER    Lab Results  Component Value Date   HGBA1C 7.1 (H) 07/15/2017     CURRENT MEDICATIONS: No current outpatient medications on file. (Ophthalmic Drugs)   No current facility-administered medications for this visit. (Ophthalmic Drugs)   Current Outpatient Medications (Other)  Medication Sig  . aspirin EC 81 MG tablet Take 81 mg by mouth daily. Swallow whole.  . carvedilol (COREG) 12.5 MG tablet Take 12.5 mg by mouth 2 (two) times daily.   . finasteride (PROSCAR) 5 MG tablet Take 5 mg by mouth every morning.   . gabapentin (NEURONTIN) 600 MG tablet Take 600 mg by mouth 2 (two) times daily.  Marland Kitchen glimepiride (AMARYL) 2 MG tablet Take 2 mg by mouth daily with breakfast.  . lisinopril (PRINIVIL,ZESTRIL) 2.5 MG tablet Take 1 tablet (2.5 mg total) by mouth daily. (Patient taking differently: Take 2.5 mg by mouth every morning.)   . metFORMIN (GLUCOPHAGE) 500 MG tablet Take 500 mg by mouth 2 (two) times daily.  . ondansetron (ZOFRAN) 4 MG tablet Take 1 tablet (4 mg total) by mouth every 8 (eight) hours as needed for nausea or vomiting. (Patient not taking: Reported on 01/11/2020)  . torsemide (DEMADEX) 20 MG tablet Take 20 mg by mouth daily as needed (swelling).  . traZODone (DESYREL) 100 MG tablet Take 1 tablet (100 mg total) by mouth at bedtime as needed for sleep.   No current facility-administered medications for this visit. (Other)      REVIEW OF SYSTEMS:    ALLERGIES Allergies  Allergen Reactions  . Oxycodone     hallucinations  . Clonidine Derivatives     Hallucinations   . Fioricet [Butalbital-Apap-Caffeine]     hallucination  . Hydrocodone-Acetaminophen     hallucinations  . Mobic [Meloxicam]     hallucinations  . Norvasc [Amlodipine Besylate]     hallucinations  . Statins     Muscle cramps  . Tramadol Itching    PAST MEDICAL HISTORY Past Medical History:  Diagnosis Date  . Arthritis   . Cancer [facility])    SKIN CANCER  . Diabetes mellitus without complication ([place])   . DVT (deep venous thrombosis) ([place]) 07/2017   nonocclusive DVT left internal  jugular   . GERD (gastroesophageal reflux disease)    H/O  . High cholesterol   . Hordeolum internum right lower eyelid 12/09/2019  . Hypertension   . Lumbar radiculitis   . Stroke ([place])    07/2017  . Wears dentures    full upper and lower   Past Surgical History:  Procedure Laterality Date  . BUNIONECTOMY    . CATARACT EXTRACTION W/PHACO Left 12/11/2017   Procedure: CATARACT EXTRACTION PHACO AND INTRAOCULAR LENS PLACEMENT ([place])  LEFT DIABETIC;  Surgeon: Leandrew Koyanagi, MD;  Location: [place];  Service: Ophthalmology;  Laterality: Left;  DIABETIC ([facility] picking pt up at 0600, needs 0630 arrival time)  . CATARACT EXTRACTION W/PHACO Right 01/15/2018   Procedure: CATARACT EXTRACTION PHACO AND INTRAOCULAR LENS PLACEMENT ([place])   RIGHT DIABETIC  leave so arrival will be around 7:45 ds;  Surgeon: Leandrew Koyanagi, MD;  Location: [place];  Service: Ophthalmology;  Laterality: Right;  Diabetic - oral meds  . CHOLECYSTECTOMY    . COLONOSCOPY    . COLONOSCOPY WITH PROPOFOL N/A 12/17/2014   Procedure: COLONOSCOPY WITH PROPOFOL;  Surgeon: Lollie Sails, MD;  Location: [facility] ENDOSCOPY;  Service: Endoscopy;  Laterality: N/A;  . correct hammertoe    . FASCIECTOMY Right 10/29/2019   Procedure: PLANTAR FIBROMA RESECTION RIGHT;  Surgeon: Caroline More, DPM;  Location: [facility];  Service: Podiatry;  Laterality: Right;  Diabetic - oral meds  . JOINT REPLACEMENT    . KNEE ARTHROSCOPY Right   . LUMBAR LAMINECTOMY/ DECOMPRESSION WITH MET-RX N/A 02/18/2019   Procedure: L4-5 DECOMPRESSION;  Surgeon: Meade Maw, MD;  Location: [facility] ORS;  Service: Neurosurgery;  Laterality: N/A;  . TOTAL HIP ARTHROPLASTY    . TOTAL KNEE ARTHROPLASTY Right     FAMILY HISTORY Family History  Problem Relation Age of Onset  . Clotting disorder Father   . Hypertension Father     SOCIAL HISTORY Social History   Tobacco Use  . Smoking status: Never Smoker  . Smokeless tobacco: Former Systems developer    Types: Secondary school teacher  . Vaping Use: Never used  Substance Use Topics  . Alcohol use: Yes    Comment: RARE  . Drug use: No         OPHTHALMIC EXAM: Base Eye Exam    Visual Acuity (ETDRS)      Right Left   Dist [place] 20/40 20/25   Dist [place] NI        Tonometry (Tonopen, 8:14 AM)      Right Left   Pressure 14 18       Pupils      Pupils Dark Light Shape React APD   Right PERRL 4 3 Round Brisk None   Left PERRL 4 3 Round Brisk None       Visual Fields (Counting fingers)      Left Right    Full Full       Extraocular Movement      Right Left    Full Full       Neuro/Psych    Oriented x3: Yes   Mood/Affect: Normal       Dilation    Right eye: 1.0% Mydriacyl, 2.5% Phenylephrine @ 8:14 AM         Slit Lamp and Fundus Exam    External Exam      Right Left   External Normal Normal       Slit Lamp Exam  Right Left   Lids/Lashes Hordeolum - lower lid, medially, 2+ Scurf, Stye, external Normal   Conjunctiva/Sclera White and quiet White and quiet   Cornea Clear Clear   Anterior Chamber Deep and quiet Deep and quiet   Iris Round and reactive Round and reactive   Lens Posterior chamber intraocular lens Posterior chamber intraocular lens   Anterior Vitreous Normal Normal       Fundus Exam      Right Left   Posterior Vitreous Normal    Disc Normal    C/D Ratio 0.0    Macula Macular thickening temporally, Severe clinically significant macular edema, Exudates in a circinate ring temporally., Microaneurysms    Vessels NPDR-Severe    Periphery Normal, good retinopexy to tear at 1030 location,            IMAGING AND PROCEDURES  Imaging and Procedures for 01/12/20  OCT, Retina - OU - Both Eyes       Right Eye Quality was borderline. Scan locations included subfoveal. Progression has been stable. Findings include abnormal foveal contour, cystoid macular edema.   Left Eye Quality was good. Scan locations included subfoveal. Findings include normal foveal contour, no IRF, no SRF, retinal drusen .   Notes CSME temporally OD persists  OS no active CSME       Color Fundus Photography Optos - OU - Both Eyes       Right Eye Progression has worsened. Disc findings include normal observations. Macula : exudates, microaneurysms. Periphery : hole.   Notes OU with moderate to severe nonproliferative diabetic retinopathy, dry ARMD OS   Previous incidental note OD with operculated retinal tear 10:00 meridian, with good retinopexy         Intravitreal Injection, Pharmacologic Agent - OD - Right Eye       Time Out 01/12/2020. 8:42 AM. Confirmed correct patient, procedure, site, and patient consented.   Anesthesia Topical anesthesia was used. Anesthetic  medications included Akten 3.5%.   Procedure Preparation included Tobramycin 0.3%, 10% betadine to eyelids. A 30 gauge needle was used.   Injection:  2.5 mg Bevacizumab (AVASTIN) 2.46m/0.1mL SOSY   NDC: 787579-728-20 Lot:: [id number]  Route: Intravitreal, Site: Right Eye  Post-op Post injection exam found visual acuity of at least counting fingers. The patient tolerated the procedure well. There were no complications. The patient received written and verbal post procedure care education. Post injection medications were not given.                 ASSESSMENT/PLAN:  Peripheral retinal hole, right Laser retinopexy to tear at 10 meridian, 1 month postop  Hordeolum internum right lower eyelid Resolved from prior exam  Severe nonproliferative diabetic retinopathy of right eye, with macular edema, associated with type 2 diabetes mellitus ([place]  The nature of diabetic macular edema was discussed with the patient. Treatment options were outlined including medical therapy, laser & vitrectomy. The use of injectable medications reviewed, including Avastin, Lucentis, and Eylea. Periodic injections into the eye are likely to resolve diabetic macular edema (swelling in the center of vision). Initially, injections are delivered are delivered every 4-6 weeks, and the interval extended as the condition improves. On average, 8-9 injections the first year, and 5 in year 2. Improvement in the condition most often improves on medical therapy. Occasional use of focal laser is also recommended for residual macular edema (swelling). Excellent control of blood glucose and blood pressure are encouraged under the care of a primary physician or endocrinologist. Similarly,  attempts to maintain serum cholesterol, low density lipoproteins, and high-density lipoproteins in a favorable range were recommended.   OD status post Avastin No. 1, still active. Once CSME temporal to the fovea has subsided on therapy, will  deliver focal treatment for longstanding resolution  Severe nonproliferative diabetic retinopathy of left eye, with macular edema, associated with type 2 diabetes mellitus ([place]) No active CSME OS today      ICD-10-CM   1. Severe nonproliferative diabetic retinopathy of right eye, with macular edema, associated with type 2 diabetes mellitus (HCC)  E11.3411 OCT, Retina - OU - Both Eyes    Color Fundus Photography Optos - OU - Both Eyes    Intravitreal Injection, Pharmacologic Agent - OD - Right Eye    bevacizumab (AVASTIN) SOSY 2.5 mg  2. Peripheral retinal hole, right  H33.321   3. Hordeolum internum right lower eyelid  H00.022   4. Severe nonproliferative diabetic retinopathy of left eye, with macular edema, associated with type 2 diabetes mellitus ([place])  B86.7544     1. OD, 1 month status post laser retinopexy for incidentally found a retinal break, asymptomatic. Good treatment present.  2. OD with active CSME temporally, will retreat today with intravitreal Avastin.  3. OS with no active maculopathy we will continue to observe  Ophthalmic Meds Ordered this visit:  Meds ordered this encounter  Medications  . bevacizumab (AVASTIN) SOSY 2.5 mg       Return in about 5 weeks (around 02/16/2020) for dilate, OD, AVASTIN OCT.  There are no Patient Instructions on file for this visit.   Explained the diagnoses, plan, and follow up with the patient and they expressed understanding.  Patient expressed understanding of the importance of proper follow up care.   Clent Demark Jobe Mutch M.D. Diseases & Surgery of the Retina and Vitreous Retina & Diabetic [place] 01/12/20     Abbreviations: M myopia (nearsighted); A astigmatism; H hyperopia (farsighted); P presbyopia; Mrx spectacle prescription;  CTL contact lenses; OD right eye; OS left eye; OU both eyes  XT exotropia; ET esotropia; PEK punctate epithelial keratitis; PEE punctate epithelial erosions; DES dry eye syndrome; MGD meibomian  gland dysfunction; ATs artificial tears; PFAT's preservative free artificial tears; [place] nuclear sclerotic cataract; PSC posterior subcapsular cataract; ERM epi-retinal membrane; PVD posterior vitreous detachment; RD retinal detachment; DM diabetes mellitus; DR diabetic retinopathy; NPDR non-proliferative diabetic retinopathy; PDR proliferative diabetic retinopathy; CSME clinically significant macular edema; DME diabetic macular edema; dbh dot blot hemorrhages; CWS cotton wool spot; POAG primary open angle glaucoma; C/D cup-to-disc ratio; HVF humphrey visual field; GVF goldmann visual field; OCT optical coherence tomography; IOP intraocular pressure; BRVO Branch retinal vein occlusion; CRVO central retinal vein occlusion; CRAO central retinal artery occlusion; BRAO branch retinal artery occlusion; RT retinal tear; SB scleral buckle; PPV pars plana vitrectomy; VH Vitreous hemorrhage; PRP panretinal laser photocoagulation; IVK intravitreal kenalog; VMT vitreomacular traction; MH Macular hole;  NVD neovascularization of the disc; NVE neovascularization elsewhere; AREDS age related eye disease study; ARMD age related macular degeneration; POAG primary open angle glaucoma; EBMD epithelial/anterior basement membrane dystrophy; ACIOL anterior chamber intraocular lens; IOL intraocular lens; PCIOL posterior chamber intraocular lens; Phaco/IOL phacoemulsification with intraocular lens placement; [employer] photorefractive keratectomy; LASIK laser assisted in situ keratomileusis; HTN hypertension; DM diabetes mellitus; COPD chronic obstructive pulmonary disease

## 2020-01-13 ENCOUNTER — Encounter (INDEPENDENT_AMBULATORY_CARE_PROVIDER_SITE_OTHER): Payer: PPO | Admitting: Ophthalmology

## 2020-01-13 ENCOUNTER — Encounter (INDEPENDENT_AMBULATORY_CARE_PROVIDER_SITE_OTHER): Payer: Self-pay | Admitting: Ophthalmology

## 2020-01-13 ENCOUNTER — Other Ambulatory Visit
Admission: RE | Admit: 2020-01-13 | Discharge: 2020-01-13 | Disposition: A | Discharge status: Home / Self Care | Payer: PPO | Source: Ambulatory Visit | Attending: Podiatry | Admitting: Podiatry

## 2020-01-13 ENCOUNTER — Ambulatory Visit (INDEPENDENT_AMBULATORY_CARE_PROVIDER_SITE_OTHER): Payer: PPO | Admitting: Ophthalmology

## 2020-01-13 ENCOUNTER — Encounter
Admission: RE | Admit: 2020-01-13 | Discharge: 2020-01-13 | Disposition: A | Discharge status: Home / Self Care | Payer: PPO | Source: Ambulatory Visit | Attending: Podiatry | Admitting: Podiatry

## 2020-01-13 DIAGNOSIS — Z20822 Contact with and (suspected) exposure to covid-19: Secondary | ICD-10-CM | POA: Insufficient documentation

## 2020-01-13 DIAGNOSIS — S0501XS Injury of conjunctiva and corneal abrasion without foreign body, right eye, sequela: Secondary | ICD-10-CM | POA: Insufficient documentation

## 2020-01-13 DIAGNOSIS — E113411 Type 2 diabetes mellitus with severe nonproliferative diabetic retinopathy with macular edema, right eye: Secondary | ICD-10-CM

## 2020-01-13 DIAGNOSIS — Z01812 Encounter for preprocedural laboratory examination: Secondary | ICD-10-CM | POA: Insufficient documentation

## 2020-01-13 LAB — SARS CORONAVIRUS 2 (TAT 6-24 HRS): SARS Coronavirus 2: NEGATIVE

## 2020-01-13 MED ORDER — BLEPHAMIDE S.O.P. 10-0.2 % OP OINT: 1.0000 "application " | TOPICAL_OINTMENT | Freq: Three times a day (TID) | OPHTHALMIC | 0 refills | Status: AC

## 2020-01-13 NOTE — Assessment & Plan Note (Signed)
We will prescribe topical TobraDex ointment to use 3 times daily for 3 days and to save it for future use in the right eye should injection be delivered again

## 2020-01-13 NOTE — Progress Notes (Signed)
01/13/2020     CHIEF COMPLAINT Patient presents for Eye Pain (1 Day s\p Avastin Inj OD/Pt c/o sharp pain and burning OD starting an hour after his appt yesterday. Pt states OD was watery all day and night. )   HISTORY OF PRESENT ILLNESS: Michael Humphrey is a 70 y.o. male who presents to the clinic today for:   HPI    Eye Pain      In right eye.  Characterized as burning and sharp pain.  Pain was noted as 5/10.  Occurring constantly.  It is worse throughout the day.  Associated symptoms include tearing.  Treatments tried include no treatments.  Response to treatment was no improvement.  I, the attending physician,  performed the HPI with the patient and updated documentation appropriately. Additional comments: 1 Day s\p Avastin Inj OD Pt c/o sharp pain and burning OD starting an hour after his appt yesterday. Pt states OD was watery all day and night.        Last edited by Tilda Franco on 01/13/2020  9:15 AM. (History)      Referring physician: Kirk Ruths, MD [facility] [place],  [place] [address]  HISTORICAL INFORMATION:   Selected notes from the MEDICAL RECORD NUMBER    Lab Results  Component Value Date   HGBA1C 7.1 (H) 07/15/2017     CURRENT MEDICATIONS: No current outpatient medications on file. (Ophthalmic Drugs)   No current facility-administered medications for this visit. (Ophthalmic Drugs)   Current Outpatient Medications (Other)  Medication Sig  . aspirin EC 81 MG tablet Take 81 mg by mouth daily. Swallow whole.  . carvedilol (COREG) 12.5 MG tablet Take 12.5 mg by mouth 2 (two) times daily.   . finasteride (PROSCAR) 5 MG tablet Take 5 mg by mouth every morning.   . gabapentin (NEURONTIN) 600 MG tablet Take 600 mg by mouth 2 (two) times daily.  Marland Kitchen glimepiride (AMARYL) 2 MG tablet Take 2 mg by mouth daily with breakfast.  . lisinopril (PRINIVIL,ZESTRIL) 2.5 MG tablet Take 1 tablet (2.5 mg total) by mouth daily.  (Patient taking differently: Take 2.5 mg by mouth every morning.)  . metFORMIN (GLUCOPHAGE) 500 MG tablet Take 500 mg by mouth 2 (two) times daily.  . ondansetron (ZOFRAN) 4 MG tablet Take 1 tablet (4 mg total) by mouth every 8 (eight) hours as needed for nausea or vomiting. (Patient not taking: Reported on 01/11/2020)  . torsemide (DEMADEX) 20 MG tablet Take 20 mg by mouth daily as needed (swelling).  . traZODone (DESYREL) 100 MG tablet Take 1 tablet (100 mg total) by mouth at bedtime as needed for sleep.   No current facility-administered medications for this visit. (Other)      REVIEW OF SYSTEMS:    ALLERGIES Allergies  Allergen Reactions  . Oxycodone     hallucinations  . Clonidine Derivatives     Hallucinations   . Fioricet [Butalbital-Apap-Caffeine]     hallucination  . Hydrocodone-Acetaminophen     hallucinations  . Mobic [Meloxicam]     hallucinations  . Norvasc [Amlodipine Besylate]     hallucinations  . Statins     Muscle cramps  . Tramadol Itching    PAST MEDICAL HISTORY Past Medical History:  Diagnosis Date  . Arthritis   . Cancer [facility])    SKIN CANCER  . Diabetes mellitus without complication ([place])   . DVT (deep venous thrombosis) ([place]) 07/2017   nonocclusive DVT left  internal jugular   . GERD (gastroesophageal reflux disease)    H/O  . High cholesterol   . Hordeolum internum right lower eyelid 12/09/2019  . Hypertension   . Lumbar radiculitis   . Stroke ([place])    07/2017  . Wears dentures    full upper and lower   Past Surgical History:  Procedure Laterality Date  . BUNIONECTOMY    . CATARACT EXTRACTION W/PHACO Left 12/11/2017   Procedure: CATARACT EXTRACTION PHACO AND INTRAOCULAR LENS PLACEMENT ([place])  LEFT DIABETIC;  Surgeon: Leandrew Koyanagi, MD;  Location: [place];  Service: Ophthalmology;  Laterality: Left;  DIABETIC ([facility] picking pt up at 0600, needs 0630 arrival time)  . CATARACT EXTRACTION W/PHACO Right 01/15/2018    Procedure: CATARACT EXTRACTION PHACO AND INTRAOCULAR LENS PLACEMENT ([place])  RIGHT DIABETIC  leave so arrival will be around 7:45 ds;  Surgeon: Leandrew Koyanagi, MD;  Location: [place];  Service: Ophthalmology;  Laterality: Right;  Diabetic - oral meds  . CHOLECYSTECTOMY    . COLONOSCOPY    . COLONOSCOPY WITH PROPOFOL N/A 12/17/2014   Procedure: COLONOSCOPY WITH PROPOFOL;  Surgeon: Lollie Sails, MD;  Location: [facility] ENDOSCOPY;  Service: Endoscopy;  Laterality: N/A;  . correct hammertoe    . FASCIECTOMY Right 10/29/2019   Procedure: PLANTAR FIBROMA RESECTION RIGHT;  Surgeon: Caroline More, DPM;  Location: [place];  Service: Podiatry;  Laterality: Right;  Diabetic - oral meds  . JOINT REPLACEMENT    . KNEE ARTHROSCOPY Right   . LUMBAR LAMINECTOMY/ DECOMPRESSION WITH MET-RX N/A 02/18/2019   Procedure: L4-5 DECOMPRESSION;  Surgeon: Meade Maw, MD;  Location: [facility]S;  Service: Neurosurgery;  Laterality: N/A;  . TOTAL HIP ARTHROPLASTY    . TOTAL KNEE ARTHROPLASTY Right     FAMILY HISTORY Family History  Problem Relation Age of Onset  . Clotting disorder Father   . Hypertension Father     SOCIAL HISTORY Social History   Tobacco Use  . Smoking status: Never Smoker  . Smokeless tobacco: Former Systems developer    Types: Secondary school teacher  . Vaping Use: Never used  Substance Use Topics  . Alcohol use: Yes    Comment: RARE  . Drug use: No         OPHTHALMIC EXAM:  Base Eye Exam    Visual Acuity (Snellen - Linear)      Right Left   Dist [place] 20/60 + 20/30 -1   Dist ph [place] 20/50        Tonometry (Tonopen, 9:19 AM)      Right Left   Pressure 22 22       Neuro/Psych    Oriented x3: Yes   Mood/Affect: Normal       Dilation    Right eye: 1.0% Mydriacyl, 2.5% Phenylephrine @ 9:19 AM        Slit Lamp and Fundus Exam    External Exam      Right Left   External Normal Normal       Slit Lamp Exam      Right Left   Lids/Lashes Normal Normal    Conjunctiva/Sclera White and quiet White and quiet   Cornea Clear, no signs of abrasion today no epithelial defect no Clear   Anterior Chamber Deep and quiet, no cells Deep and quiet   Iris Round and reactive Round and reactive   Lens Posterior chamber intraocular lens Posterior chamber intraocular lens   Anterior Vitreous Normal, no cells Normal  Fundus Exam      Right Left   Posterior Vitreous Normal    Disc Normal    C/D Ratio 0.0    Macula Macular thickening temporally, Severe clinically significant macular edema, Exudates in a circinate ring temporally., Microaneurysms    Vessels NPDR-Severe    Periphery Normal, good retinopexy to tear at 1030 location,            IMAGING AND PROCEDURES  Imaging and Procedures for 01/13/20           ASSESSMENT/PLAN:  Corneal abrasion, right, sequela We will prescribe topical TobraDex ointment to use 3 times daily for 3 days and to save it for future use in the right eye should injection be delivered again      ICD-10-CM   1. Severe nonproliferative diabetic retinopathy of right eye, with macular edema, associated with type 2 diabetes mellitus (HCC)  E11.3411 OCT, Retina - OU - Both Eyes  2. Corneal abrasion, right, sequela  S05.01XS     1.  2.  3.  Ophthalmic Meds Ordered this visit:  No orders of the defined types were placed in this encounter.      Return for As scheduled.  Patient Instructions  Patient instructed to use topical ophthalmic ointment right eye for 3 times daily 3 days only, do not continue the use.  TobraDex was originally prescribed and offered, but not on his preferred list.  Preference was then changed to [facility] ophthalmic ointment right eye 3 times daily only for 3 days and save the remainder    Explained the diagnoses, plan, and follow up with the patient and they expressed understanding.  Patient expressed understanding of the importance of proper follow up care.   Clent Demark Kimberlie Csaszar  M.D. Diseases & Surgery of the Retina and Vitreous Retina & Diabetic [place] 01/13/20     Abbreviations: M myopia (nearsighted); A astigmatism; H hyperopia (farsighted); P presbyopia; Mrx spectacle prescription;  CTL contact lenses; OD right eye; OS left eye; OU both eyes  XT exotropia; ET esotropia; PEK punctate epithelial keratitis; PEE punctate epithelial erosions; DES dry eye syndrome; MGD meibomian gland dysfunction; ATs artificial tears; PFAT's preservative free artificial tears; Gantt nuclear sclerotic cataract; PSC posterior subcapsular cataract; ERM epi-retinal membrane; PVD posterior vitreous detachment; RD retinal detachment; DM diabetes mellitus; DR diabetic retinopathy; NPDR non-proliferative diabetic retinopathy; PDR proliferative diabetic retinopathy; CSME clinically significant macular edema; DME diabetic macular edema; dbh dot blot hemorrhages; CWS cotton wool spot; POAG primary open angle glaucoma; C/D cup-to-disc ratio; HVF humphrey visual field; GVF goldmann visual field; OCT optical coherence tomography; IOP intraocular pressure; BRVO Branch retinal vein occlusion; CRVO central retinal vein occlusion; CRAO central retinal artery occlusion; BRAO branch retinal artery occlusion; RT retinal tear; SB scleral buckle; PPV pars plana vitrectomy; VH Vitreous hemorrhage; PRP panretinal laser photocoagulation; IVK intravitreal kenalog; VMT vitreomacular traction; MH Macular hole;  NVD neovascularization of the disc; NVE neovascularization elsewhere; AREDS age related eye disease study; ARMD age related macular degeneration; POAG primary open angle glaucoma; EBMD epithelial/anterior basement membrane dystrophy; ACIOL anterior chamber intraocular lens; IOL intraocular lens; PCIOL posterior chamber intraocular lens; Phaco/IOL phacoemulsification with intraocular lens placement; Kistler photorefractive keratectomy; LASIK laser assisted in situ keratomileusis; HTN hypertension; DM diabetes mellitus; COPD  chronic obstructive pulmonary disease

## 2020-01-13 NOTE — Patient Instructions (Signed)
Your procedure is scheduled on:01-15-20 FRIDAY Report to the Registration Desk on the 1st floor of the [facility]. To find out your arrival time, please call [phone] between 1PM - 3PM on:01-14-20 THURSDAY  REMEMBER: Instructions that are not followed completely may result in serious medical risk, up to and including death; or upon the discretion of your surgeon and anesthesiologist your surgery may need to be rescheduled.  Do not eat food after midnight the night before surgery.  No gum chewing, lozengers or hard candies.  You may however, drink WATER up to 2 hours before you are scheduled to arrive for your surgery. Do not drink anything within 2 hours of your scheduled arrival time  Type 1 and Type 2 diabetics should only drink water.  TAKE THESE MEDICATIONS THE MORNING OF SURGERY WITH A SIP OF WATER: -COREG (CARVEDILOL) -PROSCAR (FINASTERIDE) -GABAPENTIN (NEURONTIN)  Stop Metformin 2 days prior to surgery-STOP NOW (01-13-20) PT HAD AM DOSE ON 01-13-20  Follow recommendations from Cardiologist, Pulmonologist or PCP regarding stopping Aspirin, Coumadin, Plavix, Eliquis, Pradaxa, or Pletal-PT STOPPED ASPIRIN ON 01-11-20 MONDAY  One week prior to surgery: Stop Anti-inflammatories (NSAIDS) such as Advil, Aleve, Ibuprofen, Motrin, Naproxen, Naprosyn and Aspirin based products such as Excedrin, Goodys Powder, BC Powder-OK TO TAKE TYLENOL IF NEEDED  Stop ANY OVER THE COUNTER supplements until after surgery.  No Alcohol for 24 hours before or after surgery.  No Smoking including e-cigarettes for 24 hours prior to surgery.  No chewable tobacco products for at least 6 hours prior to surgery.  No nicotine patches on the day of surgery.  Do not use any "recreational" drugs for at least a week prior to your surgery.  Please be advised that the combination of cocaine and anesthesia may have negative outcomes, up to and including death. If you test positive for cocaine, your surgery will  be cancelled.  On the morning of surgery brush your teeth with toothpaste and water, you may rinse your mouth with mouthwash if you wish. Do not swallow any toothpaste or mouthwash.  Do not wear jewelry, make-up, hairpins, clips or nail polish.  Do not wear lotions, powders, or perfumes.   Do not shave body from the neck down 48 hours prior to surgery just in case you cut yourself which could leave a site for infection.  Also, freshly shaved skin may become irritated if using the CHG soap.  Contact lenses, hearing aids and dentures may not be worn into surgery.  Do not bring valuables to the hospital. [facility] is not responsible for any missing/lost belongings or valuables.  Notify your doctor if there is any change in your medical condition (cold, fever, infection).  Wear comfortable clothing (specific to your surgery type) to the hospital.  Plan for stool softeners for home use; pain medications have a tendency to cause constipation. You can also help prevent constipation by eating foods high in fiber such as fruits and vegetables and drinking plenty of fluids as your diet allows.  After surgery, you can help prevent lung complications by doing breathing exercises.  Take deep breaths and cough every 1-2 hours. Your doctor may order a device called an Incentive Spirometer to help you take deep breaths. When coughing or sneezing, hold a pillow firmly against your incision with both hands. This is called "splinting." Doing this helps protect your incision. It also decreases belly discomfort.  If you are being admitted to the hospital overnight, leave your suitcase in the car. After surgery it  may be brought to your room.  If you are being discharged the day of surgery, you will not be allowed to drive home. You will need a responsible adult (18 years or older) to drive you home and stay with you that night.   If you are taking public transportation, you will need to have a  responsible adult (18 years or older) with you. Please confirm with your physician that it is acceptable to use public transportation.   Please call the [place] Dept. at [phone] if you have any questions about these instructions.  Visitation Policy:  Patients undergoing a surgery or procedure may have one family member or support person with them as long as that person is not COVID-19 positive or experiencing its symptoms.  That person may remain in the waiting area during the procedure.  Inpatient Visitation:    Visiting hours are 7 a.m. to 8 p.m. Patients will be allowed one visitor. The visitor may change daily. The visitor must pass COVID-19 screenings, use hand sanitizer when entering and exiting the patient's room and wear a mask at all times, including in the patient's room. Patients must also wear a mask when staff or their visitor are in the room. Masking is required regardless of vaccination status. Systemwide, no visitors 17 or younger.

## 2020-01-13 NOTE — Patient Instructions (Signed)
Patient instructed to use topical ophthalmic ointment right eye for 3 times daily 3 days only, do not continue the use.  TobraDex was originally prescribed and offered, but not on his preferred list.  Preference was then changed to [facility] ophthalmic ointment right eye 3 times daily only for 3 days and save the remainder

## 2020-01-14 ENCOUNTER — Other Ambulatory Visit: Admission: RE | Admit: 2020-01-14 | Payer: PPO | Source: Ambulatory Visit

## 2020-01-15 ENCOUNTER — Other Ambulatory Visit: Payer: Self-pay

## 2020-01-15 ENCOUNTER — Encounter
Admission: RE | Disposition: A | Discharge status: Home / Self Care | Payer: Self-pay | Source: Home / Self Care | Attending: Podiatry

## 2020-01-15 ENCOUNTER — Encounter: Payer: Self-pay | Admitting: Podiatry

## 2020-01-15 ENCOUNTER — Ambulatory Visit
Admission: RE | Admit: 2020-01-15 | Discharge: 2020-01-15 | Disposition: A | Discharge status: Home / Self Care | Payer: PPO | Attending: Podiatry | Admitting: Podiatry

## 2020-01-15 ENCOUNTER — Ambulatory Visit: Payer: PPO | Admitting: Anesthesiology

## 2020-01-15 DIAGNOSIS — I129 Hypertensive chronic kidney disease with stage 1 through stage 4 chronic kidney disease, or unspecified chronic kidney disease: Secondary | ICD-10-CM | POA: Insufficient documentation

## 2020-01-15 DIAGNOSIS — Z8673 Personal history of transient ischemic attack (TIA), and cerebral infarction without residual deficits: Secondary | ICD-10-CM | POA: Diagnosis not present

## 2020-01-15 DIAGNOSIS — Z8249 Family history of ischemic heart disease and other diseases of the circulatory system: Secondary | ICD-10-CM | POA: Insufficient documentation

## 2020-01-15 DIAGNOSIS — M869 Osteomyelitis, unspecified: Secondary | ICD-10-CM | POA: Insufficient documentation

## 2020-01-15 DIAGNOSIS — I2510 Atherosclerotic heart disease of native coronary artery without angina pectoris: Secondary | ICD-10-CM | POA: Insufficient documentation

## 2020-01-15 DIAGNOSIS — Z888 Allergy status to other drugs, medicaments and biological substances status: Secondary | ICD-10-CM | POA: Diagnosis not present

## 2020-01-15 DIAGNOSIS — E1169 Type 2 diabetes mellitus with other specified complication: Secondary | ICD-10-CM | POA: Insufficient documentation

## 2020-01-15 DIAGNOSIS — Z832 Family history of diseases of the blood and blood-forming organs and certain disorders involving the immune mechanism: Secondary | ICD-10-CM | POA: Diagnosis not present

## 2020-01-15 DIAGNOSIS — E1151 Type 2 diabetes mellitus with diabetic peripheral angiopathy without gangrene: Secondary | ICD-10-CM | POA: Diagnosis not present

## 2020-01-15 DIAGNOSIS — E11621 Type 2 diabetes mellitus with foot ulcer: Secondary | ICD-10-CM | POA: Insufficient documentation

## 2020-01-15 DIAGNOSIS — Z86711 Personal history of pulmonary embolism: Secondary | ICD-10-CM | POA: Diagnosis not present

## 2020-01-15 DIAGNOSIS — L97521 Non-pressure chronic ulcer of other part of left foot limited to breakdown of skin: Secondary | ICD-10-CM | POA: Diagnosis not present

## 2020-01-15 DIAGNOSIS — I872 Venous insufficiency (chronic) (peripheral): Secondary | ICD-10-CM | POA: Insufficient documentation

## 2020-01-15 DIAGNOSIS — Z87891 Personal history of nicotine dependence: Secondary | ICD-10-CM | POA: Diagnosis not present

## 2020-01-15 DIAGNOSIS — L97529 Non-pressure chronic ulcer of other part of left foot with unspecified severity: Secondary | ICD-10-CM | POA: Diagnosis not present

## 2020-01-15 DIAGNOSIS — E7800 Pure hypercholesterolemia, unspecified: Secondary | ICD-10-CM | POA: Diagnosis not present

## 2020-01-15 DIAGNOSIS — Z7984 Long term (current) use of oral hypoglycemic drugs: Secondary | ICD-10-CM | POA: Insufficient documentation

## 2020-01-15 DIAGNOSIS — Z818 Family history of other mental and behavioral disorders: Secondary | ICD-10-CM | POA: Diagnosis not present

## 2020-01-15 DIAGNOSIS — Z79899 Other long term (current) drug therapy: Secondary | ICD-10-CM | POA: Insufficient documentation

## 2020-01-15 DIAGNOSIS — N183 Chronic kidney disease, stage 3 unspecified: Secondary | ICD-10-CM | POA: Diagnosis not present

## 2020-01-15 DIAGNOSIS — M86172 Other acute osteomyelitis, left ankle and foot: Secondary | ICD-10-CM | POA: Diagnosis not present

## 2020-01-15 DIAGNOSIS — E1122 Type 2 diabetes mellitus with diabetic chronic kidney disease: Secondary | ICD-10-CM | POA: Diagnosis not present

## 2020-01-15 DIAGNOSIS — Z885 Allergy status to narcotic agent status: Secondary | ICD-10-CM | POA: Diagnosis not present

## 2020-01-15 DIAGNOSIS — K219 Gastro-esophageal reflux disease without esophagitis: Secondary | ICD-10-CM | POA: Diagnosis not present

## 2020-01-15 DIAGNOSIS — I10 Essential (primary) hypertension: Secondary | ICD-10-CM | POA: Diagnosis not present

## 2020-01-15 HISTORY — PX: AMPUTATION TOE: SHX6595

## 2020-01-15 LAB — POCT I-STAT, CHEM 8
BUN: 22 mg/dL (ref 8–23)
Calcium, Ion: 1.26 mmol/L (ref 1.15–1.40)
Chloride: 100 mmol/L (ref 98–111)
Creatinine, Ser: 1.1 mg/dL (ref 0.61–1.24)
Glucose, Bld: 157 mg/dL — ABNORMAL HIGH (ref 70–99)
HCT: 43 % (ref 39.0–52.0)
Hemoglobin: 14.6 g/dL (ref 13.0–17.0)
Potassium: 3.8 mmol/L (ref 3.5–5.1)
Sodium: 139 mmol/L (ref 135–145)
TCO2: 25 mmol/L (ref 22–32)

## 2020-01-15 LAB — CBC
HCT: 42.9 % (ref 39.0–52.0)
Hemoglobin: 14.8 g/dL (ref 13.0–17.0)
MCH: 31.6 pg (ref 26.0–34.0)
MCHC: 34.5 g/dL (ref 30.0–36.0)
MCV: 91.7 fL (ref 80.0–100.0)
Platelets: 150 10*3/uL (ref 150–400)
RBC: 4.68 MIL/uL (ref 4.22–5.81)
RDW: 13.3 % (ref 11.5–15.5)
WBC: 6.7 10*3/uL (ref 4.0–10.5)
nRBC: 0 % (ref 0.0–0.2)

## 2020-01-15 LAB — GLUCOSE, CAPILLARY: Glucose-Capillary: 130 mg/dL — ABNORMAL HIGH (ref 70–99)

## 2020-01-15 SURGERY — AMPUTATION, TOE
Anesthesia: General | Site: Toe | Laterality: Left

## 2020-01-15 MED ORDER — DEXAMETHASONE SODIUM PHOSPHATE 10 MG/ML IJ SOLN
INTRAMUSCULAR | Status: DC | PRN
  Administered 2020-01-15: 10 mg via INTRAVENOUS

## 2020-01-15 MED ORDER — LIDOCAINE HCL (PF) 1 % IJ SOLN
INTRAMUSCULAR | Status: DC | PRN
  Administered 2020-01-15: 5 mL

## 2020-01-15 MED ORDER — FENTANYL CITRATE (PF) 100 MCG/2ML IJ SOLN
INTRAMUSCULAR | Status: DC | PRN
  Administered 2020-01-15: 50 ug via INTRAVENOUS

## 2020-01-15 MED ORDER — GLYCOPYRROLATE 0.2 MG/ML IJ SOLN
INTRAMUSCULAR | Status: DC | PRN
  Administered 2020-01-15: .2 mg via INTRAVENOUS

## 2020-01-15 MED ORDER — CHLORHEXIDINE GLUCONATE 0.12 % MT SOLN: 15.0000 mL | Freq: Once | OROMUCOSAL | Status: AC

## 2020-01-15 MED ORDER — PROPOFOL 500 MG/50ML IV EMUL
INTRAVENOUS | Status: DC | PRN
  Administered 2020-01-15: 100 ug/kg/min via INTRAVENOUS

## 2020-01-15 MED ORDER — MIDAZOLAM HCL 2 MG/2ML IJ SOLN
INTRAMUSCULAR | Status: AC
  Filled 2020-01-15: qty 2

## 2020-01-15 MED ORDER — FENTANYL CITRATE (PF) 100 MCG/2ML IJ SOLN
INTRAMUSCULAR | Status: AC
  Filled 2020-01-15: qty 2

## 2020-01-15 MED ORDER — CHLORHEXIDINE GLUCONATE 0.12 % MT SOLN
OROMUCOSAL | Status: AC
  Administered 2020-01-15: 15 mL via OROMUCOSAL
  Filled 2020-01-15: qty 15

## 2020-01-15 MED ORDER — MIDAZOLAM HCL 2 MG/2ML IJ SOLN
INTRAMUSCULAR | Status: DC | PRN
  Administered 2020-01-15: 2 mg via INTRAVENOUS

## 2020-01-15 MED ORDER — FAMOTIDINE 20 MG PO TABS: 20.0000 mg | ORAL_TABLET | Freq: Once | ORAL | Status: AC

## 2020-01-15 MED ORDER — ORAL CARE MOUTH RINSE: 15.0000 mL | Freq: Once | OROMUCOSAL | Status: AC

## 2020-01-15 MED ORDER — ACETAMINOPHEN 10 MG/ML IV SOLN
INTRAVENOUS | Status: DC | PRN
  Administered 2020-01-15: 1000 mg via INTRAVENOUS

## 2020-01-15 MED ORDER — POVIDONE-IODINE 7.5 % EX SOLN
Freq: Once | CUTANEOUS | Status: DC
  Filled 2020-01-15: qty 118

## 2020-01-15 MED ORDER — CEFAZOLIN SODIUM-DEXTROSE 2-4 GM/100ML-% IV SOLN
2.0000 g | INTRAVENOUS | Status: AC
  Administered 2020-01-15: 2 g via INTRAVENOUS

## 2020-01-15 MED ORDER — PROPOFOL 500 MG/50ML IV EMUL
INTRAVENOUS | Status: AC
  Filled 2020-01-15: qty 50

## 2020-01-15 MED ORDER — ACETAMINOPHEN 10 MG/ML IV SOLN
INTRAVENOUS | Status: AC
  Filled 2020-01-15: qty 100

## 2020-01-15 MED ORDER — KETOROLAC TROMETHAMINE 30 MG/ML IJ SOLN
INTRAMUSCULAR | Status: DC | PRN
  Administered 2020-01-15: 30 mg via INTRAVENOUS

## 2020-01-15 MED ORDER — BUPIVACAINE HCL 0.5 % IJ SOLN
INTRAMUSCULAR | Status: DC | PRN
  Administered 2020-01-15: 5 mL

## 2020-01-15 MED ORDER — SODIUM CHLORIDE 0.9 % IV SOLN: INTRAVENOUS | Status: DC

## 2020-01-15 MED ORDER — ONDANSETRON HCL 4 MG/2ML IJ SOLN
INTRAMUSCULAR | Status: DC | PRN
  Administered 2020-01-15: 4 mg via INTRAVENOUS

## 2020-01-15 MED ORDER — FENTANYL CITRATE (PF) 100 MCG/2ML IJ SOLN: 25.0000 ug | INTRAMUSCULAR | Status: DC | PRN

## 2020-01-15 MED ORDER — CEFAZOLIN SODIUM-DEXTROSE 2-4 GM/100ML-% IV SOLN
INTRAVENOUS | Status: AC
  Filled 2020-01-15: qty 100

## 2020-01-15 MED ORDER — FAMOTIDINE 20 MG PO TABS
ORAL_TABLET | ORAL | Status: AC
  Administered 2020-01-15: 20 mg via ORAL
  Filled 2020-01-15: qty 1

## 2020-01-15 SURGICAL SUPPLY — 46 items
BLADE OSC/SAGITTAL MD 5.5X18 (BLADE) ×2 IMPLANT
BLADE SURG MINI STRL (BLADE) IMPLANT
BNDG CMPR STD VLCR NS LF 5.8X4 (GAUZE/BANDAGES/DRESSINGS) ×1
BNDG CONFORM 2 STRL LF (GAUZE/BANDAGES/DRESSINGS) ×2 IMPLANT
BNDG CONFORM 3 STRL LF (GAUZE/BANDAGES/DRESSINGS) ×2 IMPLANT
BNDG ELASTIC 4X5.8 VLCR NS LF (GAUZE/BANDAGES/DRESSINGS) ×2 IMPLANT
BNDG ESMARK 4X12 TAN STRL LF (GAUZE/BANDAGES/DRESSINGS) IMPLANT
BNDG GAUZE 4.5X4.1 6PLY STRL (MISCELLANEOUS) ×2 IMPLANT
CANISTER SUCT 1200ML W/VALVE (MISCELLANEOUS) IMPLANT
COVER WAND RF STERILE (DRAPES) IMPLANT
CUFF TOURN SGL QUICK 12 (TOURNIQUET CUFF) IMPLANT
CUFF TOURN SGL QUICK 18X4 (TOURNIQUET CUFF) IMPLANT
DRAPE FLUOR MINI C-ARM 54X84 (DRAPES) IMPLANT
DRAPE XRAY CASSETTE 23X24 (DRAPES) IMPLANT
DURAPREP 26ML APPLICATOR (WOUND CARE) ×2 IMPLANT
ELECT REM PT RETURN 9FT ADLT (ELECTROSURGICAL) ×2
ELECTRODE REM PT RTRN 9FT ADLT (ELECTROSURGICAL) ×1 IMPLANT
GAUZE PACKING IODOFORM 1/2 (PACKING) ×2 IMPLANT
GAUZE SPONGE 4X4 12PLY STRL (GAUZE/BANDAGES/DRESSINGS) ×2 IMPLANT
GAUZE XEROFORM 1X8 LF (GAUZE/BANDAGES/DRESSINGS) ×2 IMPLANT
GLOVE BIO SURGEON STRL SZ7.5 (GLOVE) ×2 IMPLANT
GLOVE INDICATOR 8.0 STRL GRN (GLOVE) ×2 IMPLANT
GOWN STRL REUS W/ TWL XL LVL3 (GOWN DISPOSABLE) ×2 IMPLANT
GOWN STRL REUS W/TWL XL LVL3 (GOWN DISPOSABLE) ×4
KIT TURNOVER KIT A (KITS) ×2 IMPLANT
LABEL OR SOLS (LABEL) IMPLANT
MANIFOLD NEPTUNE II (INSTRUMENTS) ×2 IMPLANT
NEEDLE FILTER BLUNT 18X 1/2SAF (NEEDLE) ×1
NEEDLE FILTER BLUNT 18X1 1/2 (NEEDLE) ×1 IMPLANT
NEEDLE HYPO 25X1 1.5 SAFETY (NEEDLE) ×2 IMPLANT
NS IRRIG 500ML POUR BTL (IV SOLUTION) ×2 IMPLANT
PACK EXTREMITY ARMC (MISCELLANEOUS) ×2 IMPLANT
PAD ABD DERMACEA PRESS 5X9 (GAUZE/BANDAGES/DRESSINGS) IMPLANT
PULSAVAC PLUS IRRIG FAN TIP (DISPOSABLE)
SHIELD FULL FACE ANTIFOG 7M (MISCELLANEOUS) IMPLANT
SOL .9 NS 3000ML IRR  AL (IV SOLUTION)
SOL .9 NS 3000ML IRR AL (IV SOLUTION)
SOL .9 NS 3000ML IRR UROMATIC (IV SOLUTION) IMPLANT
STOCKINETTE M/LG 89821 (MISCELLANEOUS) ×2 IMPLANT
STRAP SAFETY 5IN WIDE (MISCELLANEOUS) ×2 IMPLANT
SUT ETHILON 3-0 FS-10 30 BLK (SUTURE) ×2
SUT ETHILON 5-0 FS-2 18 BLK (SUTURE) IMPLANT
SUT VIC AB 4-0 FS2 27 (SUTURE) IMPLANT
SUTURE EHLN 3-0 FS-10 30 BLK (SUTURE) ×1 IMPLANT
SYR 10ML LL (SYRINGE) ×6 IMPLANT
TIP FAN IRRIG PULSAVAC PLUS (DISPOSABLE) IMPLANT

## 2020-01-15 NOTE — Discharge Instructions (Signed)
AMBULATORY SURGERY  DISCHARGE INSTRUCTIONS   1) The drugs that you were given will stay in your system until tomorrow so for the next 24 hours you should not:  A) Drive an automobile B) Make any legal decisions C) Drink any alcoholic beverage   2) You may resume regular meals tomorrow.  Today it is better to start with liquids and gradually work up to solid foods.  You may eat anything you prefer, but it is better to start with liquids, then soup and crackers, and gradually work up to solid foods.   3) Please notify your doctor immediately if you have any unusual bleeding, trouble breathing, redness and pain at the surgery site, drainage, fever, or pain not relieved by medication. 4)   5) Your post-operative visit with Dr.                                     is: Date:                        Time:    Please call to schedule your post-operative visit.  Additional Instructions:   IBUPROFEN AND TYLENOL (ok to take at the same time, per manufacturer's instructions with time frame and dosage; Dr. Vickki Muff will send in RX to your pharmacy for neugenta.    [place] DR. TROXLER, DR. Vickki Muff, AND DR. McCleary   1. Take your medication as prescribed.  Pain medication should be taken only as needed.  2. Keep the dressing clean, dry and intact.  3. Keep your foot elevated above the heart level for the first 48 hours.  4. Walking to the bathroom and brief periods of walking are acceptable, unless we have instructed you to be non-weight bearing.  5. Always wear your post-op shoe when walking.  Always use your crutches if you are to be non-weight bearing.  6. Do not take a shower. Baths are permissible as long as the foot is kept out of the water.   7. Every hour you are awake:  - Bend your knee 15 times. - Flex foot 15 times - Massage calf 15 times  8. Call [facility] [phone]) if any of the following problems occur: - You develop a temperature or fever. - The bandage becomes saturated with blood. - Medication does not stop your pain. - Injury of the foot occurs. - Any symptoms of infection including redness, odor, or red streaks running from wound.

## 2020-01-15 NOTE — Op Note (Signed)
Operative note   Surgeon:Jahzaria Vary Lawyer: None    Preop diagnosis: Osteomyelitis left second toe    Postop diagnosis: Same    Procedure: Amputation left second toe proximal phalanx    EBL: Minimal    Anesthesia:local and IV sedation    Hemostasis: None    Specimen: Amputated distal left second toe and deep wound culture    Complications: None    Operative indications:Michael Humphrey is an 70 y.o. that presents today for surgical intervention.  The risks/benefits/alternatives/complications have been discussed and consent has been given.    Procedure:  Patient was brought into the OR and placed on the operating table in thesupine position. After anesthesia was obtained theleft lower extremity was prepped and draped in usual sterile fashion.  Attention was directed to the left second toe where 2 distal skin flaps were created at the level of the PIPJ.  Full-thickness flaps were created down to the level of bone.  The bone was then cut at about the level of the midshaft of the proximal phalanx.  At this time the toe was then removed from the surgical field.  Wound was then flushed with copious amounts of irrigation.  Closure was then performed with a 3-0 nylon.  Bulky sterile dressing was performed.    Patient tolerated the procedure and anesthesia well.  Was transported from the OR to the PACU with all vital signs stable and vascular status intact. To be discharged per routine protocol.  Will follow up in approximately 1 week in the outpatient clinic.

## 2020-01-15 NOTE — Anesthesia Preprocedure Evaluation (Addendum)
Anesthesia Evaluation  Patient identified by MRN, date of birth, ID band Patient awake    Reviewed: Allergy & Precautions, H&P , NPO status , Patient's Chart, lab work & pertinent test results  History of Anesthesia Complications Negative for: history of anesthetic complications  Airway Mallampati: III  TM Distance: <3 FB Neck ROM: full    Dental  (+) Upper Dentures, Lower Dentures   Pulmonary neg pulmonary ROS, neg shortness of breath,    Pulmonary exam normal        Cardiovascular Exercise Tolerance: Good hypertension, (-) angina(-) Past MI and (-) DOE Normal cardiovascular exam     Neuro/Psych  Neuromuscular disease CVA, Residual Symptoms negative psych ROS   GI/Hepatic Neg liver ROS, GERD  Medicated and Controlled,  Endo/Other  diabetes, Type 2  Renal/GU negative Renal ROS  negative genitourinary   Musculoskeletal  (+) Arthritis ,   Abdominal   Peds  Hematology negative hematology ROS (+)   Anesthesia Other Findings Past Medical History: No date: Arthritis No date: Cancer [facility])     Comment:  SKIN CANCER No date: Diabetes mellitus without complication ([place]) 32/9518: DVT (deep venous thrombosis) (HCC)     Comment:  nonocclusive DVT left internal jugular  No date: GERD (gastroesophageal reflux disease)     Comment:  H/O No date: High cholesterol 12/09/2019: Hordeolum internum right lower eyelid No date: Hypertension No date: Lumbar radiculitis No date: Stroke [facility])     Comment:  07/2017 No date: Wears dentures     Comment:  full upper and lower  Past Surgical History: No date: BUNIONECTOMY 12/11/2017: CATARACT EXTRACTION W/PHACO; Left     Comment:  Procedure: CATARACT EXTRACTION PHACO AND INTRAOCULAR               LENS PLACEMENT ([place])  LEFT DIABETIC;  Surgeon:               Leandrew Koyanagi, MD;  Location: [place];              Service: Ophthalmology;  Laterality: Left;  DIABETIC                ([facility] picking pt up at 0600, needs 0630 arrival time) 01/15/2018: CATARACT EXTRACTION W/PHACO; Right     Comment:  Procedure: CATARACT EXTRACTION PHACO AND INTRAOCULAR               LENS PLACEMENT ([place])  RIGHT DIABETIC  leave so arrival               will be around 7:45 ds;  Surgeon: Leandrew Koyanagi,               MD;  Location: [place];  Service:               Ophthalmology;  Laterality: Right;  Diabetic - oral meds No date: CHOLECYSTECTOMY No date: COLONOSCOPY 12/17/2014: COLONOSCOPY WITH PROPOFOL; N/A     Comment:  Procedure: COLONOSCOPY WITH PROPOFOL;  Surgeon: Lollie Sails, MD;  Location: [facility] ENDOSCOPY;  Service:               Endoscopy;  Laterality: N/A; No date: correct hammertoe 10/29/2019: FASCIECTOMY; Right     Comment:  Procedure: PLANTAR FIBROMA RESECTION RIGHT;  Surgeon:               Caroline More, DPM;  Location: [place];  Service: Podiatry;  Laterality: Right;  Diabetic - oral               meds No date: JOINT REPLACEMENT No date: KNEE ARTHROSCOPY; Right 02/18/2019: LUMBAR LAMINECTOMY/ DECOMPRESSION WITH MET-RX; N/A     Comment:  Procedure: L4-5 DECOMPRESSION;  Surgeon: Meade Maw, MD;  Location: [facility] ORS;  Service: Neurosurgery;              Laterality: N/A; No date: TOTAL HIP ARTHROPLASTY No date: TOTAL KNEE ARTHROPLASTY; Right  BMI    Body Mass Index: 29.53 kg/m      Reproductive/Obstetrics negative OB ROS                            Anesthesia Physical Anesthesia Plan  ASA: III  Anesthesia Plan: General   Post-op Pain Management:    Induction: Intravenous  PONV Risk Score and Plan: Propofol infusion and TIVA  Airway Management Planned: Natural Airway and Nasal Cannula  Additional Equipment:   Intra-op Plan:   Post-operative Plan:   Informed Consent: I have reviewed the patients History and Physical, chart, labs and discussed the  procedure including the risks, benefits and alternatives for the proposed anesthesia with the patient or authorized representative who has indicated his/her understanding and acceptance.     Dental Advisory Given  Plan Discussed with: Anesthesiologist, CRNA and Surgeon  Anesthesia Plan Comments: (Patient consented for risks of anesthesia including but not limited to:  - adverse reactions to medications - risk of airway placement if required - damage to eyes, teeth, lips or other oral mucosa - nerve damage due to positioning  - sore throat or hoarseness - Damage to heart, brain, nerves, lungs, other parts of body or loss of life  Patient voiced understanding.)        Anesthesia Quick Evaluation

## 2020-01-15 NOTE — OR Nursing (Signed)
Pt/spouse questioned pain med with d/c instructions (advised he could only take nugenta) - discussed with Dr. Vickki Muff, states for [place] to notify pt to take tylenol and ibuprofen around the clock and that when he gets to office he will escribe nugenta.  Pt/spouse aware of same - added to d/c instructions.

## 2020-01-15 NOTE — Anesthesia Postprocedure Evaluation (Signed)
Anesthesia Post Note  Patient: Michael Humphrey  Procedure(s) Performed: AMPUTATION TOE MPJ T1 (Left Toe)  Patient location during evaluation: PACU Anesthesia Type: General Level of consciousness: awake and alert Pain management: pain level controlled Vital Signs Assessment: post-procedure vital signs reviewed and stable Respiratory status: spontaneous breathing, nonlabored ventilation, respiratory function stable and patient connected to nasal cannula oxygen Cardiovascular status: blood pressure returned to baseline and stable Postop Assessment: no apparent nausea or vomiting Anesthetic complications: no   No complications documented.   Last Vitals:  Vitals:   01/15/20 1439 01/15/20 1454  BP: (!) 146/89 (!) 165/101  Pulse: 70 72  Resp: 11 17  Temp:  (!) 36.3 C  SpO2: 97% 96%    Last Pain:  Vitals:   01/15/20 1454  TempSrc:   PainSc: 0-No pain                 Precious Haws Piscitello

## 2020-01-15 NOTE — Anesthesia Procedure Notes (Signed)
Procedure Name: General with mask airway Performed by: Fletcher-Harrison, Jemuel Laursen, CRNA Pre-anesthesia Checklist: Patient identified, Emergency Drugs available, Suction available and Patient being monitored Patient Re-evaluated:Patient Re-evaluated prior to induction Oxygen Delivery Method: Simple face mask Induction Type: IV induction Placement Confirmation: positive ETCO2 and CO2 detector Dental Injury: Teeth and Oropharynx as per pre-operative assessment        

## 2020-01-15 NOTE — H&P (Signed)
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  The various methods of treatment have been discussed with the patient.  No guarantees were given.  After consideration of risks, benefits and other options for treatment, the patient has consented to surgery.  I have reviewed the patients' chart and labs.     A history and physical examination was performed in my office.  The patient was reexamined.  There have been no changes to this history and physical examination.  Samara Deist A

## 2020-01-15 NOTE — Transfer of Care (Signed)
Immediate Anesthesia Transfer of Care Note  Patient: Michael Humphrey  Procedure(s) Performed: AMPUTATION TOE MPJ T1 (Left Toe)  Patient Location: PACU  Anesthesia Type:General  Level of Consciousness: awake, drowsy and patient cooperative  Airway & Oxygen Therapy: Patient Spontanous Breathing and Patient connected to face mask oxygen  Post-op Assessment: Report given to RN and Post -op Vital signs reviewed and stable  Post vital signs: Reviewed and stable  Last Vitals:  Vitals Value Taken Time  BP 125/81 01/15/20 1354  Temp    Pulse 80 01/15/20 1356  Resp 15 01/15/20 1356  SpO2 96 % 01/15/20 1356  Vitals shown include unvalidated device data.  Last Pain:  Vitals:   01/15/20 1050  TempSrc: Oral  PainSc: 5          Complications: No complications documented.

## 2020-01-19 LAB — SURGICAL PATHOLOGY

## 2020-01-21 DIAGNOSIS — E1142 Type 2 diabetes mellitus with diabetic polyneuropathy: Secondary | ICD-10-CM | POA: Diagnosis not present

## 2020-01-21 DIAGNOSIS — L97521 Non-pressure chronic ulcer of other part of left foot limited to breakdown of skin: Secondary | ICD-10-CM | POA: Diagnosis not present

## 2020-01-21 DIAGNOSIS — B351 Tinea unguium: Secondary | ICD-10-CM | POA: Diagnosis not present

## 2020-01-21 DIAGNOSIS — M86172 Other acute osteomyelitis, left ankle and foot: Secondary | ICD-10-CM | POA: Diagnosis not present

## 2020-01-21 DIAGNOSIS — Z89422 Acquired absence of other left toe(s): Secondary | ICD-10-CM | POA: Diagnosis not present

## 2020-01-21 LAB — AEROBIC/ANAEROBIC CULTURE W GRAM STAIN (SURGICAL/DEEP WOUND)

## 2020-01-27 DIAGNOSIS — L97521 Non-pressure chronic ulcer of other part of left foot limited to breakdown of skin: Secondary | ICD-10-CM | POA: Diagnosis not present

## 2020-01-27 DIAGNOSIS — M86172 Other acute osteomyelitis, left ankle and foot: Secondary | ICD-10-CM | POA: Diagnosis not present

## 2020-01-27 DIAGNOSIS — E1142 Type 2 diabetes mellitus with diabetic polyneuropathy: Secondary | ICD-10-CM | POA: Diagnosis not present

## 2020-01-28 DIAGNOSIS — D225 Melanocytic nevi of trunk: Secondary | ICD-10-CM | POA: Diagnosis not present

## 2020-01-28 DIAGNOSIS — D1801 Hemangioma of skin and subcutaneous tissue: Secondary | ICD-10-CM | POA: Diagnosis not present

## 2020-01-28 DIAGNOSIS — L570 Actinic keratosis: Secondary | ICD-10-CM | POA: Diagnosis not present

## 2020-01-28 DIAGNOSIS — Z85828 Personal history of other malignant neoplasm of skin: Secondary | ICD-10-CM | POA: Diagnosis not present

## 2020-01-28 DIAGNOSIS — L821 Other seborrheic keratosis: Secondary | ICD-10-CM | POA: Diagnosis not present

## 2020-01-28 DIAGNOSIS — L814 Other melanin hyperpigmentation: Secondary | ICD-10-CM | POA: Diagnosis not present

## 2020-01-28 DIAGNOSIS — L820 Inflamed seborrheic keratosis: Secondary | ICD-10-CM | POA: Diagnosis not present

## 2020-01-28 DIAGNOSIS — Z1283 Encounter for screening for malignant neoplasm of skin: Secondary | ICD-10-CM | POA: Diagnosis not present

## 2020-01-28 DIAGNOSIS — L578 Other skin changes due to chronic exposure to nonionizing radiation: Secondary | ICD-10-CM | POA: Diagnosis not present

## 2020-01-28 DIAGNOSIS — L905 Scar conditions and fibrosis of skin: Secondary | ICD-10-CM | POA: Diagnosis not present

## 2020-01-28 DIAGNOSIS — D485 Neoplasm of uncertain behavior of skin: Secondary | ICD-10-CM | POA: Diagnosis not present

## 2020-01-28 DIAGNOSIS — D2270 Melanocytic nevi of unspecified lower limb, including hip: Secondary | ICD-10-CM | POA: Diagnosis not present

## 2020-01-28 DIAGNOSIS — D2260 Melanocytic nevi of unspecified upper limb, including shoulder: Secondary | ICD-10-CM | POA: Diagnosis not present

## 2020-02-15 DIAGNOSIS — J069 Acute upper respiratory infection, unspecified: Secondary | ICD-10-CM | POA: Diagnosis not present

## 2020-02-15 DIAGNOSIS — E1122 Type 2 diabetes mellitus with diabetic chronic kidney disease: Secondary | ICD-10-CM | POA: Diagnosis not present

## 2020-02-15 DIAGNOSIS — N183 Chronic kidney disease, stage 3 unspecified: Secondary | ICD-10-CM | POA: Diagnosis not present

## 2020-02-15 DIAGNOSIS — I129 Hypertensive chronic kidney disease with stage 1 through stage 4 chronic kidney disease, or unspecified chronic kidney disease: Secondary | ICD-10-CM | POA: Diagnosis not present

## 2020-02-15 DIAGNOSIS — I10 Essential (primary) hypertension: Secondary | ICD-10-CM | POA: Diagnosis not present

## 2020-02-16 ENCOUNTER — Ambulatory Visit (INDEPENDENT_AMBULATORY_CARE_PROVIDER_SITE_OTHER): Payer: PPO | Admitting: Ophthalmology

## 2020-02-16 ENCOUNTER — Other Ambulatory Visit: Payer: Self-pay

## 2020-02-16 ENCOUNTER — Encounter (INDEPENDENT_AMBULATORY_CARE_PROVIDER_SITE_OTHER): Payer: Self-pay | Admitting: Ophthalmology

## 2020-02-16 DIAGNOSIS — E113411 Type 2 diabetes mellitus with severe nonproliferative diabetic retinopathy with macular edema, right eye: Secondary | ICD-10-CM | POA: Diagnosis not present

## 2020-02-16 MED ORDER — BEVACIZUMAB 2.5 MG/0.1ML IZ SOSY
2.5000 mg | PREFILLED_SYRINGE | INTRAVITREAL | Status: AC | PRN
  Administered 2020-02-16: 2.5 mg via INTRAVITREAL

## 2020-02-16 NOTE — Progress Notes (Signed)
02/16/2020     CHIEF COMPLAINT Patient presents for Retina Follow Up (5 Week F/U OD, poss Avastin OD//Pt c/o difficulty reading. No other new symptoms.)   HISTORY OF PRESENT ILLNESS: Michael Humphrey is a 70 y.o. male who presents to the clinic today for:   HPI    Retina Follow Up    Patient presents with  Diabetic Retinopathy.  In right eye.  This started 5 weeks ago.  Severity is mild.  Duration of 5 weeks.  Since onset it is stable. Additional comments: 5 Week F/U OD, poss Avastin OD  Pt c/o difficulty reading. No other new symptoms.       Last edited by Rockie Neighbours, [place] on 02/16/2020  8:08 AM. (History)      Referring physician: Kirk Ruths, MD [place] [facility] [place],  [place] [address]  HISTORICAL INFORMATION:   Selected notes from the MEDICAL RECORD NUMBER    Lab Results  Component Value Date   HGBA1C 7.1 (H) 07/15/2017     CURRENT MEDICATIONS: No current outpatient medications on file. (Ophthalmic Drugs)   No current facility-administered medications for this visit. (Ophthalmic Drugs)   Current Outpatient Medications (Other)  Medication Sig  . aspirin EC 81 MG tablet Take 81 mg by mouth daily. Swallow whole.  . carvedilol (COREG) 12.5 MG tablet Take 12.5 mg by mouth 2 (two) times daily.   . finasteride (PROSCAR) 5 MG tablet Take 5 mg by mouth every morning.   . gabapentin (NEURONTIN) 600 MG tablet Take 600 mg by mouth 2 (two) times daily.  Marland Kitchen glimepiride (AMARYL) 2 MG tablet Take 2 mg by mouth daily with breakfast.  . lisinopril (PRINIVIL,ZESTRIL) 2.5 MG tablet Take 1 tablet (2.5 mg total) by mouth daily. (Patient taking differently: Take 2.5 mg by mouth every morning.)  . metFORMIN (GLUCOPHAGE) 500 MG tablet Take 500 mg by mouth 2 (two) times daily.  Marland Kitchen torsemide (DEMADEX) 20 MG tablet Take 20 mg by mouth daily as needed (swelling).  . traZODone (DESYREL) 100 MG tablet Take 1 tablet (100 mg total) by mouth at  bedtime as needed for sleep.   No current facility-administered medications for this visit. (Other)      REVIEW OF SYSTEMS:    ALLERGIES Allergies  Allergen Reactions  . Oxycodone     hallucinations  . Clonidine Derivatives     Hallucinations   . Fioricet [Butalbital-Apap-Caffeine]     hallucination  . Hydrocodone-Acetaminophen     hallucinations  . Mobic [Meloxicam]     hallucinations  . Norvasc [Amlodipine Besylate]     hallucinations  . Statins     Muscle cramps  . Tramadol Itching    PAST MEDICAL HISTORY Past Medical History:  Diagnosis Date  . Arthritis   . Cancer [facility])    SKIN CANCER  . Diabetes mellitus without complication ([place])   . DVT (deep venous thrombosis) ([place]) 07/2017   nonocclusive DVT left internal jugular   . GERD (gastroesophageal reflux disease)    H/O  . High cholesterol   . Hordeolum internum right lower eyelid 12/09/2019  . Hypertension   . Lumbar radiculitis   . Stroke ([place])    07/2017  . Wears dentures    full upper and lower   Past Surgical History:  Procedure Laterality Date  . AMPUTATION TOE Left 01/15/2020   Procedure: AMPUTATION TOE MPJ T1;  Surgeon: Samara Deist, DPM;  Location: [facility];  Service: Podiatry;  Laterality: Left;  . BUNIONECTOMY    . CATARACT EXTRACTION W/PHACO Left 12/11/2017   Procedure: CATARACT EXTRACTION PHACO AND INTRAOCULAR LENS PLACEMENT ([place])  LEFT DIABETIC;  Surgeon: Leandrew Koyanagi, MD;  Location: [place];  Service: Ophthalmology;  Laterality: Left;  DIABETIC ([facility] picking pt up at 0600, needs 0630 arrival time)  . CATARACT EXTRACTION W/PHACO Right 01/15/2018   Procedure: CATARACT EXTRACTION PHACO AND INTRAOCULAR LENS PLACEMENT ([place])  RIGHT DIABETIC  leave so arrival will be around 7:45 ds;  Surgeon: Leandrew Koyanagi, MD;  Location: [place];  Service: Ophthalmology;  Laterality: Right;  Diabetic - oral meds  . CHOLECYSTECTOMY    . COLONOSCOPY    . COLONOSCOPY WITH  PROPOFOL N/A 12/17/2014   Procedure: COLONOSCOPY WITH PROPOFOL;  Surgeon: Lollie Sails, MD;  Location: [facility] END[facility]COPY;  Service: Endoscopy;  Laterality: N/A;  . correct hammertoe    . FASCIECTOMY Right 10/29/2019   Procedure: PLANTAR FIBROMA RESECTION RIGHT;  Surgeon: Caroline More, DPM;  Location: [place];  Service: Podiatry;  Laterality: Right;  Diabetic - oral meds  . JOINT REPLACEMENT    . KNEE ARTHROSCOPY Right   . LUMBAR LAMINECTOMY/ DECOMPRESSION WITH MET-RX N/A 02/18/2019   Procedure: L4-5 DECOMPRESSION;  Surgeon: Meade Maw, MD;  Location: [facility];  Service: Neurosurgery;  Laterality: N/A;  . TOTAL HIP ARTHROPLASTY    . TOTAL KNEE ARTHROPLASTY Right     FAMILY HISTORY Family History  Problem Relation Age of Onset  . Clotting disorder Father   . Hypertension Father     SOCIAL HISTORY Social History   Tobacco Use  . Smoking status: Never Smoker  . Smokeless tobacco: Former Systems developer    Types: Secondary school teacher  . Vaping Use: Never used  Substance Use Topics  . Alcohol use: Yes    Comment: RARE  . Drug use: No         OPHTHALMIC EXAM: Base Eye Exam    Visual Acuity (ETDRS)      Right Left   Dist [place] 20/60 20/40   Dist ph [place] 20/50 +2 NI       Tonometry (Tonopen, 8:09 AM)      Right Left   Pressure 16 18       Pupils      Pupils Dark Light Shape React APD   Right PERRL 4 3 Round Brisk None   Left PERRL 4 3 Round Brisk None       Visual Fields (Counting fingers)      Left Right    Full Full       Extraocular Movement      Right Left    Full Full       Neuro/Psych    Oriented x3: Yes   Mood/Affect: Normal       Dilation    Right eye: 1.0% Mydriacyl, 2.5% Phenylephrine @ 8:10 AM        Slit Lamp and Fundus Exam    External Exam      Right Left   External Normal Normal       Slit Lamp Exam      Right Left   Lids/Lashes Normal Normal   Conjunctiva/Sclera White and quiet White and quiet   Cornea Clear, no signs of  abrasion today no epithelial defect no Clear   Anterior Chamber Deep and quiet, no cells Deep and quiet   Iris Round and reactive Round and reactive   Lens Posterior chamber intraocular lens Posterior  chamber intraocular lens   Anterior Vitreous Normal, no cells Normal       Fundus Exam      Right Left   Posterior Vitreous Normal    Disc Normal    C/D Ratio 0.0    Macula Macular thickening temporally, Severe clinically significant macular edema, Exudates in a circinate ring temporally., Microaneurysms    Vessels NPDR-Severe    Periphery Normal, good retinopexy to tear at 1030 location,            IMAGING AND PROCEDURES  Imaging and Procedures for 02/16/20  OCT, Retina - OU - Both Eyes       Right Eye Quality was borderline. Scan locations included subfoveal. Progression has been stable. Findings include abnormal foveal contour, cystoid macular edema.   Left Eye Quality was good. Scan locations included subfoveal. Findings include normal foveal contour, no IRF, no SRF, retinal drusen .   Notes CSME temporally OD persists center threatening but not involved.  OS no active CSME       Intravitreal Injection, Pharmacologic Agent - OD - Right Eye       Time Out 02/16/2020. 8:42 AM. Confirmed correct patient, procedure, site, and patient consented.   Anesthesia Topical anesthesia was used. Anesthetic medications included [place] 3.5%.   Procedure Preparation included Tobramycin 0.3%, 10% betadine to eyelids. A 30 gauge needle was used.   Injection:  2.5 mg Bevacizumab (AVASTIN) 2.52m/0.1mL SOSY   NDC:: 15830-940-76 Lot:: [id number]  Route: Intravitreal, Site: Right Eye  Post-op Post injection exam found visual acuity of at least counting fingers. The patient tolerated the procedure well. There were no complications. The patient received written and verbal post procedure care education. Post injection medications were not given.                  ASSESSMENT/PLAN:  Severe nonproliferative diabetic retinopathy of right eye, with macular edema, associated with type 2 diabetes mellitus (HCC) Noncentral involved CSME OD, slightly improved over time temporal to the fovea on medical therapy with antivegF injections however ideal circinate ring residual CSME for focal laser treatment.  We will repeat injection antivegF, Avastin today to minimize thickening and then within 2 weeks bring the patient to the office for focal laser treatment with intentional closure of microaneurysms, not a simple grid laser treatment      ICD-10-CM   1. Severe nonproliferative diabetic retinopathy of right eye, with macular edema, associated with type 2 diabetes mellitus (HCC)  E11.3411 OCT, Retina - OU - Both Eyes    Intravitreal Injection, Pharmacologic Agent - OD - Right Eye    bevacizumab (AVASTIN) SOSY 2.5 mg    1.  See notes under CSME above, repeat intravitreal Avastin today and follow-up in 2 weeks for focal laser treatment intentional closure of microaneurysms within circinate ring temporally OD  2.  3.  Ophthalmic Meds Ordered this visit:  Meds ordered this encounter  Medications  . bevacizumab (AVASTIN) SOSY 2.5 mg       Return in about 2 weeks (around 03/01/2020) for dilate, OD, FOCAL.  There are no Patient Instructions on file for this visit.   Explained the diagnoses, plan, and follow up with the patient and they expressed understanding.  Patient expressed understanding of the importance of proper follow up care.   GClent DemarkRankin M.D. Diseases & Surgery of the Retina and Vitreous Retina & Diabetic EMiami02/15/22     Abbreviations: M myopia (nearsighted); A astigmatism; H hyperopia (farsighted); P  presbyopia; Mrx spectacle prescription;  CTL contact lenses; OD right eye; OS left eye; OU both eyes  XT exotropia; ET esotropia; PEK punctate epithelial keratitis; PEE punctate epithelial erosions; DES dry eye syndrome; MGD  meibomian gland dysfunction; ATs artificial tears; PFAT's preservative free artificial tears; [place] nuclear sclerotic cataract; PSC posterior subcapsular cataract; ERM epi-retinal membrane; PVD posterior vitreous detachment; RD retinal detachment; DM diabetes mellitus; DR diabetic retinopathy; NPDR non-proliferative diabetic retinopathy; PDR proliferative diabetic retinopathy; CSME clinically significant macular edema; DME diabetic macular edema; dbh dot blot hemorrhages; CWS cotton wool spot; POAG primary open angle glaucoma; C/D cup-to-disc ratio; HVF humphrey visual field; GVF goldmann visual field; OCT optical coherence tomography; IOP intraocular pressure; BRVO Branch retinal vein occlusion; CRVO central retinal vein occlusion; CRAO central retinal artery occlusion; BRAO branch retinal artery occlusion; RT retinal tear; SB scleral buckle; PPV pars plana vitrectomy; VH Vitreous hemorrhage; PRP panretinal laser photocoagulation; IVK intravitreal kenalog; VMT vitreomacular traction; MH Macular hole;  NVD neovascularization of the disc; NVE neovascularization elsewhere; AREDS age related eye disease study; ARMD age related macular degeneration; POAG primary open angle glaucoma; EBMD epithelial/anterior basement membrane dystrophy; ACIOL anterior chamber intraocular lens; IOL intraocular lens; PCIOL posterior chamber intraocular lens; Phaco/IOL phacoemulsification with intraocular lens placement; [place] photorefractive keratectomy; LASIK laser assisted in situ keratomileusis; HTN hypertension; DM diabetes mellitus; COPD chronic obstructive pulmonary disease

## 2020-02-16 NOTE — Assessment & Plan Note (Signed)
Noncentral involved CSME OD, slightly improved over time temporal to the fovea on medical therapy with antivegF injections however ideal circinate ring residual CSME for focal laser treatment.  We will repeat injection antivegF, Avastin today to minimize thickening and then within 2 weeks bring the patient to the office for focal laser treatment with intentional closure of microaneurysms, not a simple grid laser treatment

## 2020-02-24 DIAGNOSIS — B351 Tinea unguium: Secondary | ICD-10-CM | POA: Diagnosis not present

## 2020-02-24 DIAGNOSIS — Z89422 Acquired absence of other left toe(s): Secondary | ICD-10-CM | POA: Diagnosis not present

## 2020-02-24 DIAGNOSIS — B353 Tinea pedis: Secondary | ICD-10-CM | POA: Diagnosis not present

## 2020-02-24 DIAGNOSIS — E1142 Type 2 diabetes mellitus with diabetic polyneuropathy: Secondary | ICD-10-CM | POA: Diagnosis not present

## 2020-02-24 DIAGNOSIS — L853 Xerosis cutis: Secondary | ICD-10-CM | POA: Diagnosis not present

## 2020-02-24 DIAGNOSIS — M86172 Other acute osteomyelitis, left ankle and foot: Secondary | ICD-10-CM | POA: Diagnosis not present

## 2020-03-01 ENCOUNTER — Encounter (INDEPENDENT_AMBULATORY_CARE_PROVIDER_SITE_OTHER): Payer: Self-pay | Admitting: Ophthalmology

## 2020-03-01 ENCOUNTER — Other Ambulatory Visit: Payer: Self-pay

## 2020-03-01 ENCOUNTER — Ambulatory Visit (INDEPENDENT_AMBULATORY_CARE_PROVIDER_SITE_OTHER): Payer: PPO | Admitting: Ophthalmology

## 2020-03-01 DIAGNOSIS — E113411 Type 2 diabetes mellitus with severe nonproliferative diabetic retinopathy with macular edema, right eye: Secondary | ICD-10-CM

## 2020-03-01 DIAGNOSIS — E113412 Type 2 diabetes mellitus with severe nonproliferative diabetic retinopathy with macular edema, left eye: Secondary | ICD-10-CM

## 2020-03-01 NOTE — Progress Notes (Signed)
03/01/2020     CHIEF COMPLAINT Patient presents for Retina Follow Up (Focal OD and OCT/Pt denies complaints. States vision is stable./BGL: did not check)   HISTORY OF PRESENT ILLNESS: Michael Humphrey is a 70 y.o. male who presents to the clinic today for:   HPI    Retina Follow Up    Patient presents with  Diabetic Retinopathy.  In right eye.  Severity is severe.  Duration of 2 weeks.  Since onset it is stable.  I, the attending physician,  performed the HPI with the patient and updated documentation appropriately. Additional comments: Focal OD and OCT Pt denies complaints. States vision is stable. BGL: did not check       Last edited by Tilda Franco on 03/01/2020  8:26 AM. (History)      Referring physician: Kirk Ruths, MD [facility] [place],  [place] [address]  HISTORICAL INFORMATION:   Selected notes from the MEDICAL RECORD NUMBER    Lab Results  Component Value Date   HGBA1C 7.1 (H) 07/15/2017     CURRENT MEDICATIONS: No current outpatient medications on file. (Ophthalmic Drugs)   No current facility-administered medications for this visit. (Ophthalmic Drugs)   Current Outpatient Medications (Other)  Medication Sig  . aspirin EC 81 MG tablet Take 81 mg by mouth daily. Swallow whole.  . carvedilol (COREG) 12.5 MG tablet Take 12.5 mg by mouth 2 (two) times daily.   . finasteride (PROSCAR) 5 MG tablet Take 5 mg by mouth every morning.   . gabapentin (NEURONTIN) 600 MG tablet Take 600 mg by mouth 2 (two) times daily.  Marland Kitchen glimepiride (AMARYL) 2 MG tablet Take 2 mg by mouth daily with breakfast.  . lisinopril (PRINIVIL,ZESTRIL) 2.5 MG tablet Take 1 tablet (2.5 mg total) by mouth daily. (Patient taking differently: Take 2.5 mg by mouth every morning.)  . metFORMIN (GLUCOPHAGE) 500 MG tablet Take 500 mg by mouth 2 (two) times daily.  Marland Kitchen torsemide (DEMADEX) 20 MG tablet Take 20 mg by mouth daily as needed (swelling).  .  traZODone (DESYREL) 100 MG tablet Take 1 tablet (100 mg total) by mouth at bedtime as needed for sleep.   No current facility-administered medications for this visit. (Other)      REVIEW OF SYSTEMS: ROS    Positive for: Endocrine   Last edited by Tilda Franco on 03/01/2020  8:26 AM. (History)       ALLERGIES Allergies  Allergen Reactions  . Oxycodone     hallucinations  . Clonidine Derivatives     Hallucinations   . Fioricet [Butalbital-Apap-Caffeine]     hallucination  . Hydrocodone-Acetaminophen     hallucinations  . Mobic [Meloxicam]     hallucinations  . Norvasc [Amlodipine Besylate]     hallucinations  . Statins     Muscle cramps  . Tramadol Itching    PAST MEDICAL HISTORY Past Medical History:  Diagnosis Date  . Arthritis   . Cancer [facility])    SKIN CANCER  . Diabetes mellitus without complication ([place])   . DVT (deep venous thrombosis) ([place]) 07/2017   nonocclusive DVT left internal jugular   . GERD (gastroesophageal reflux disease)    H/O  . High cholesterol   . Hordeolum internum right lower eyelid 12/09/2019  . Hypertension   . Lumbar radiculitis   . Stroke ([place])    07/2017  . Wears dentures    full upper and lower   Past  Surgical History:  Procedure Laterality Date  . AMPUTATION TOE Left 01/15/2020   Procedure: AMPUTATION TOE MPJ T1;  Surgeon: Samara Deist, DPM;  Location: [facility] ORS;  Service: Podiatry;  Laterality: Left;  . BUNIONECTOMY    . CATARACT EXTRACTION W/PHACO Left 12/11/2017   Procedure: CATARACT EXTRACTION PHACO AND INTRAOCULAR LENS PLACEMENT ([place])  LEFT DIABETIC;  Surgeon: Leandrew Koyanagi, MD;  Location: [place];  Service: Ophthalmology;  Laterality: Left;  DIABETIC (ACTA picking pt up at 0600, needs 0630 arrival time)  . CATARACT EXTRACTION W/PHACO Right 01/15/2018   Procedure: CATARACT EXTRACTION PHACO AND INTRAOCULAR LENS PLACEMENT ([place])  RIGHT DIABETIC  leave so arrival will be around 7:45 ds;  Surgeon:  Leandrew Koyanagi, MD;  Location: [place];  Service: Ophthalmology;  Laterality: Right;  Diabetic - oral meds  . CHOLECYSTECTOMY    . COLONOSCOPY    . COLONOSCOPY WITH PROPOFOL N/A 12/17/2014   Procedure: COLONOSCOPY WITH PROPOFOL;  Surgeon: Lollie Sails, MD;  Location: [facility] END[facility]COPY;  Service: Endoscopy;  Laterality: N/A;  . correct hammertoe    . FASCIECTOMY Right 10/29/2019   Procedure: PLANTAR FIBROMA RESECTION RIGHT;  Surgeon: Caroline More, DPM;  Location: [place];  Service: Podiatry;  Laterality: Right;  Diabetic - oral meds  . JOINT REPLACEMENT    . KNEE ARTHROSCOPY Right   . LUMBAR LAMINECTOMY/ DECOMPRESSION WITH MET-RX N/A 02/18/2019   Procedure: L4-5 DECOMPRESSION;  Surgeon: Meade Maw, MD;  Location: [facility] ORS;  Service: Neurosurgery;  Laterality: N/A;  . TOTAL HIP ARTHROPLASTY    . TOTAL KNEE ARTHROPLASTY Right     FAMILY HISTORY Family History  Problem Relation Age of Onset  . Clotting disorder Father   . Hypertension Father     SOCIAL HISTORY Social History   Tobacco Use  . Smoking status: Never Smoker  . Smokeless tobacco: Former Systems developer    Types: Secondary school teacher  . Vaping Use: Never used  Substance Use Topics  . Alcohol use: Yes    Comment: RARE  . Drug use: No         OPHTHALMIC EXAM: Base Eye Exam    Visual Acuity (Snellen - Linear)      Right Left   Dist [place] 20/50 -2 20/30 -1   Dist ph  20/40 -3        Tonometry (Tonopen, 8:29 AM)      Right Left   Pressure 18 18       Neuro/Psych    Oriented x3: Yes   Mood/Affect: Normal       Dilation    Right eye: 1.0% Mydriacyl, 2.5% Phenylephrine @ 8:29 AM        Slit Lamp and Fundus Exam    External Exam      Right Left   External Normal Normal       Slit Lamp Exam      Right Left   Lids/Lashes Normal Normal   Conjunctiva/Sclera White and quiet White and quiet   Cornea Clear, no signs of abrasion today no epithelial defect no Clear   Anterior  Chamber Deep and quiet, no cells Deep and quiet   Iris Round and reactive Round and reactive   Lens Posterior chamber intraocular lens Posterior chamber intraocular lens   Anterior Vitreous Normal, no cells Normal          IMAGING AND PROCEDURES  Imaging and Procedures for 03/01/20  OCT, Retina - OU - Both Eyes  Right Eye Quality was borderline. Scan locations included subfoveal. Progression has been stable. Findings include abnormal foveal contour, cystoid macular edema.   Left Eye Quality was good. Scan locations included subfoveal. Findings include normal foveal contour, no IRF, no SRF, retinal drusen .   Notes CSME temporally OD persists center threatening but not involved.  Unable to obtain a map due to ocular movements dilated and nondilated  OS no active CSME       Focal Laser - OD - Right Eye       Time Out Confirmed correct patient, procedure, site, and patient consented.   Anesthesia Topical anesthesia was used. Anesthetic medications included Proparacaine 0.5%.   Laser Information The type of laser was diode. Color was yellow. The duration in seconds was 0.1. The spot size was 100 microns. Laser Humphrey was 150. Total spots was 124.   Post-op The patient tolerated the procedure well. There were no complications. The patient received written and verbal post procedure care education.   Notes Focal applied temporal region of thickening, with best laser takes at the 150-170 Humphrey inf portion of exudates                ASSESSMENT/PLAN:  Severe nonproliferative diabetic retinopathy of right eye, with macular edema, associated with type 2 diabetes mellitus (HCC) Noncentral involved CSME temporal macula right eye.  Focal laser delivered again today increase laser Humphrey to resolve this region and prevent extension to the fovea      ICD-10-CM   1. Severe nonproliferative diabetic retinopathy of right eye, with macular edema, associated with type 2  diabetes mellitus (HCC)  E11.3411 OCT, Retina - OU - Both Eyes    Focal Laser - OD - Right Eye  2. Severe nonproliferative diabetic retinopathy of left eye, with macular edema, associated with type 2 diabetes mellitus (HCC)  Y64.1830     1.  OS stable no active CSME need to follow-up monitor severe NPDR light OU next  2.  OD with noncentral involved CSME temporally.  Focal laser treatment completed today without map direction due to patient submitting this diagnosis last excellent treatment was it was completed  3.  Ophthalmic Meds Ordered this visit:  No orders of the defined types were placed in this encounter.      Return in about 4 months (around 07/01/2020) for DILATE OU, OCT.  There are no Patient Instructions on file for this visit.   Explained the diagnoses, plan, and follow up with the patient and they expressed understanding.  Patient expressed understanding of the importance of proper follow up care.   Alford Highland Keron Koffman M.D. Diseases & Surgery of the Retina and Vitreous [facility] 03/01/20     Abbreviations: M myopia (nearsighted); A astigmatism; H hyperopia (farsighted); P presbyopia; Mrx spectacle prescription;  CTL contact lenses; OD right eye; OS left eye; OU both eyes  XT exotropia; ET esotropia; PEK punctate epithelial keratitis; PEE punctate epithelial erosions; DES dry eye syndrome; MGD meibomian gland dysfunction; ATs artificial tears; PFAT's preservative free artificial tears; NSC nuclear sclerotic cataract; PSC posterior subcapsular cataract; ERM epi-retinal membrane; PVD posterior vitreous detachment; RD retinal detachment; DM diabetes mellitus; DR diabetic retinopathy; NPDR non-proliferative diabetic retinopathy; PDR proliferative diabetic retinopathy; CSME clinically significant macular edema; DME diabetic macular edema; dbh dot blot hemorrhages; CWS cotton wool spot; POAG primary open angle glaucoma; C/D cup-to-disc ratio; HVF humphrey visual  field; GVF goldmann visual field; OCT optical coherence tomography; IOP intraocular pressure; BRVO Branch  retinal vein occlusion; CRVO central retinal vein occlusion; CRAO central retinal artery occlusion; BRAO branch retinal artery occlusion; RT retinal tear; SB scleral buckle; PPV pars plana vitrectomy; VH Vitreous hemorrhage; PRP panretinal laser photocoagulation; IVK intravitreal kenalog; VMT vitreomacular traction; MH Macular hole;  NVD neovascularization of the disc; NVE neovascularization elsewhere; AREDS age related eye disease study; ARMD age related macular degeneration; POAG primary open angle glaucoma; EBMD epithelial/anterior basement membrane dystrophy; ACIOL anterior chamber intraocular lens; IOL intraocular lens; PCIOL posterior chamber intraocular lens; Phaco/IOL phacoemulsification with intraocular lens placement; [place] photorefractive keratectomy; LASIK laser assisted in situ keratomileusis; HTN hypertension; DM diabetes mellitus; COPD chronic obstructive pulmonary disease

## 2020-03-01 NOTE — Assessment & Plan Note (Signed)
Noncentral involved CSME temporal macula right eye.  Focal laser delivered again today increase laser power to resolve this region and prevent extension to the fovea

## 2020-03-17 DIAGNOSIS — D485 Neoplasm of uncertain behavior of skin: Secondary | ICD-10-CM | POA: Diagnosis not present

## 2020-03-17 DIAGNOSIS — D229 Melanocytic nevi, unspecified: Secondary | ICD-10-CM | POA: Diagnosis not present

## 2020-03-31 DIAGNOSIS — N183 Chronic kidney disease, stage 3 unspecified: Secondary | ICD-10-CM | POA: Diagnosis not present

## 2020-03-31 DIAGNOSIS — I779 Disorder of arteries and arterioles, unspecified: Secondary | ICD-10-CM | POA: Diagnosis not present

## 2020-03-31 DIAGNOSIS — R079 Chest pain, unspecified: Secondary | ICD-10-CM | POA: Diagnosis not present

## 2020-03-31 DIAGNOSIS — T466X5A Adverse effect of antihyperlipidemic and antiarteriosclerotic drugs, initial encounter: Secondary | ICD-10-CM | POA: Diagnosis not present

## 2020-03-31 DIAGNOSIS — M791 Myalgia, unspecified site: Secondary | ICD-10-CM | POA: Diagnosis not present

## 2020-03-31 DIAGNOSIS — I10 Essential (primary) hypertension: Secondary | ICD-10-CM | POA: Diagnosis not present

## 2020-03-31 DIAGNOSIS — E1122 Type 2 diabetes mellitus with diabetic chronic kidney disease: Secondary | ICD-10-CM | POA: Diagnosis not present

## 2020-03-31 DIAGNOSIS — I129 Hypertensive chronic kidney disease with stage 1 through stage 4 chronic kidney disease, or unspecified chronic kidney disease: Secondary | ICD-10-CM | POA: Diagnosis not present

## 2020-03-31 DIAGNOSIS — E113299 Type 2 diabetes mellitus with mild nonproliferative diabetic retinopathy without macular edema, unspecified eye: Secondary | ICD-10-CM | POA: Diagnosis not present

## 2020-04-21 DIAGNOSIS — I129 Hypertensive chronic kidney disease with stage 1 through stage 4 chronic kidney disease, or unspecified chronic kidney disease: Secondary | ICD-10-CM | POA: Diagnosis not present

## 2020-04-21 DIAGNOSIS — E1122 Type 2 diabetes mellitus with diabetic chronic kidney disease: Secondary | ICD-10-CM | POA: Diagnosis not present

## 2020-04-21 DIAGNOSIS — I10 Essential (primary) hypertension: Secondary | ICD-10-CM | POA: Diagnosis not present

## 2020-04-21 DIAGNOSIS — J40 Bronchitis, not specified as acute or chronic: Secondary | ICD-10-CM | POA: Diagnosis not present

## 2020-04-21 DIAGNOSIS — N183 Chronic kidney disease, stage 3 unspecified: Secondary | ICD-10-CM | POA: Diagnosis not present

## 2020-04-21 IMAGING — MR MR THORACIC SPINE W/O CM
6 series · 30 of 48 positions shown · non-contrast
Comparison: None.

CLINICAL DATA: Neurogenic claudication due to lumbar stenosis.
Bilateral leg pain. Back pain

EXAM:
MRI THORACIC SPINE WITHOUT CONTRAST
TECHNIQUE: Multiplanar, multisequence MR imaging of the thoracic spine was
performed. No intravenous contrast was administered.

[Series 16: T1 · sagittal · 5.0mm · 1.88mm/px · 2 of 9 slices shown (1 of 2)]
[im 1/9]
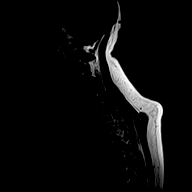
[im 9/9]
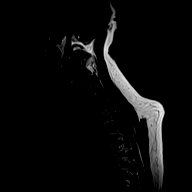

[Series 17: T2 · sagittal · 3.0mm · 1.06mm/px · 6 of 17 slices shown (1 of 2)]
[im 1/17]
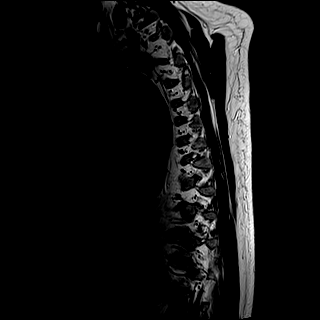
[im 4/17]
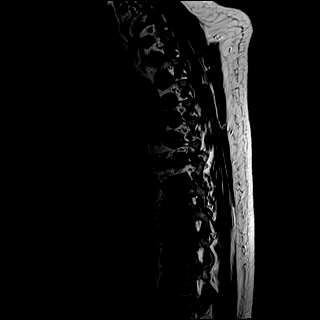
[im 7/17]
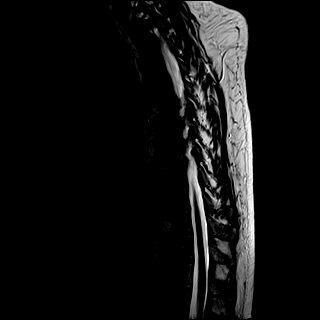
[im 10/17]
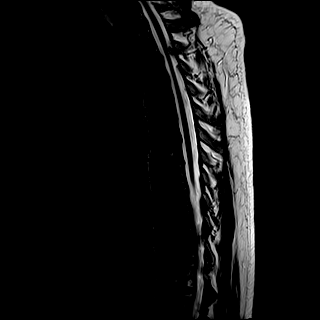
[im 13/17]
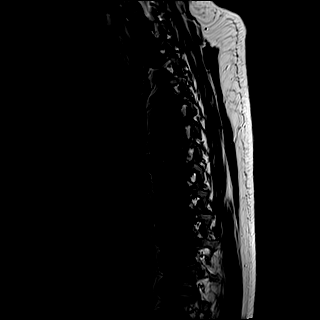
[im 17/17]
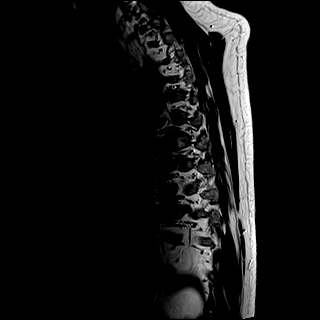

[Series 18: T1 · sagittal · 3.0mm · 1.06mm/px · 6 of 17 slices shown (2 of 2)]
[im 1/17]
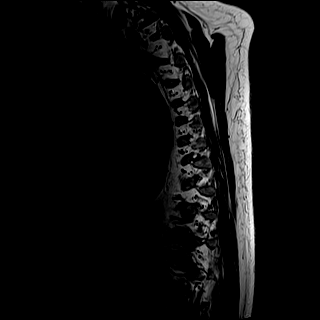
[im 4/17]
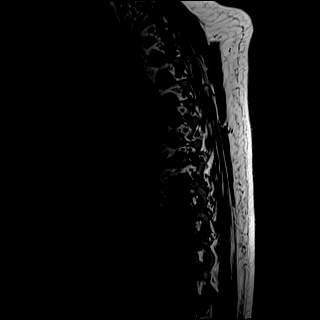
[im 7/17]
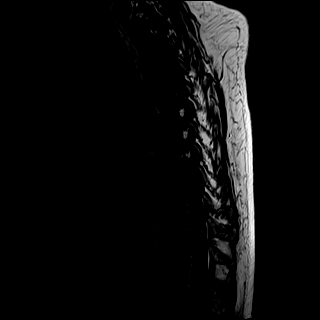
[im 10/17]
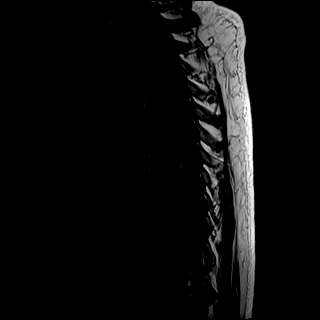
[im 13/17]
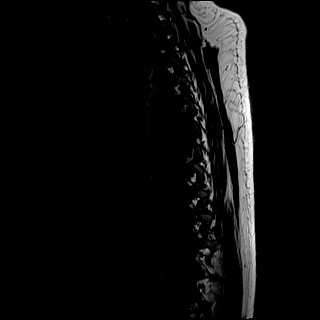
[im 17/17]
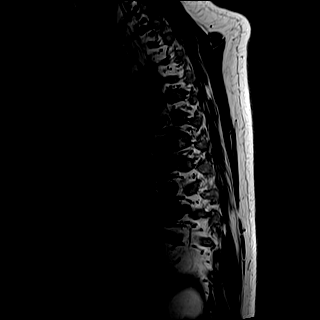

[Series 19: STIR · sagittal · 3.0mm · 0.53mm/px · 6 of 17 slices shown]
[im 1/17]
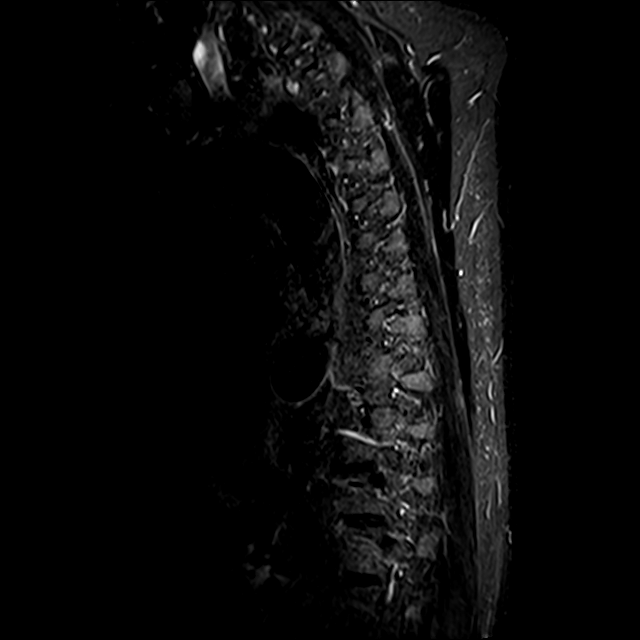
[im 4/17]
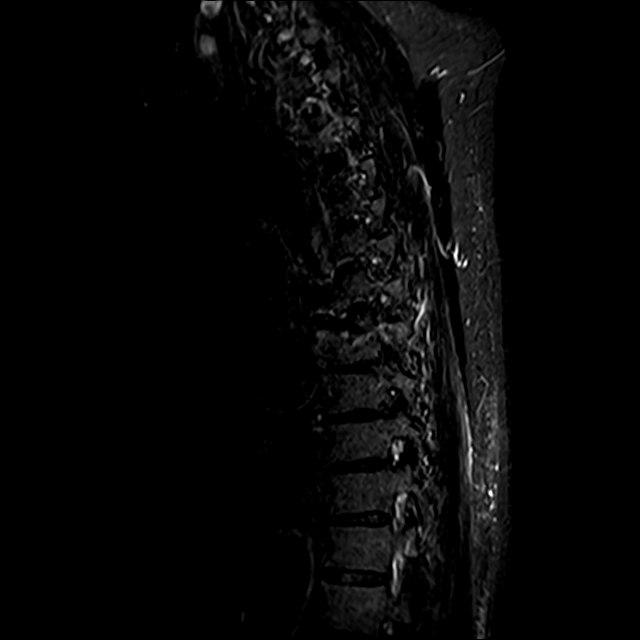
[im 7/17]
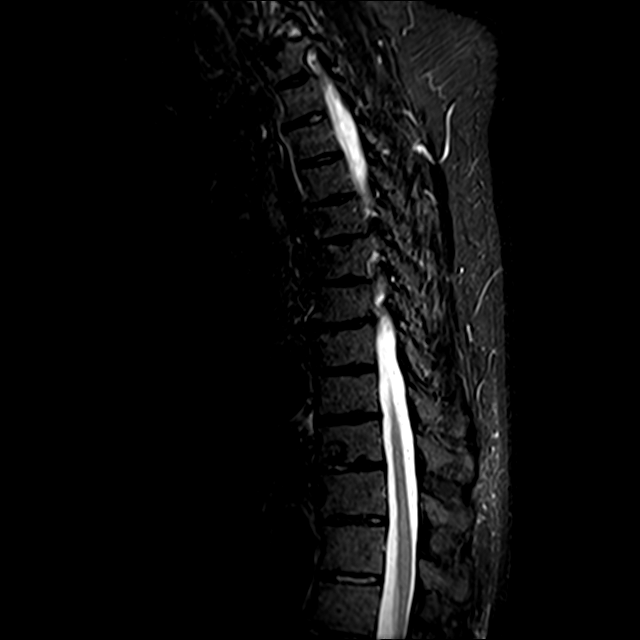
[im 10/17]
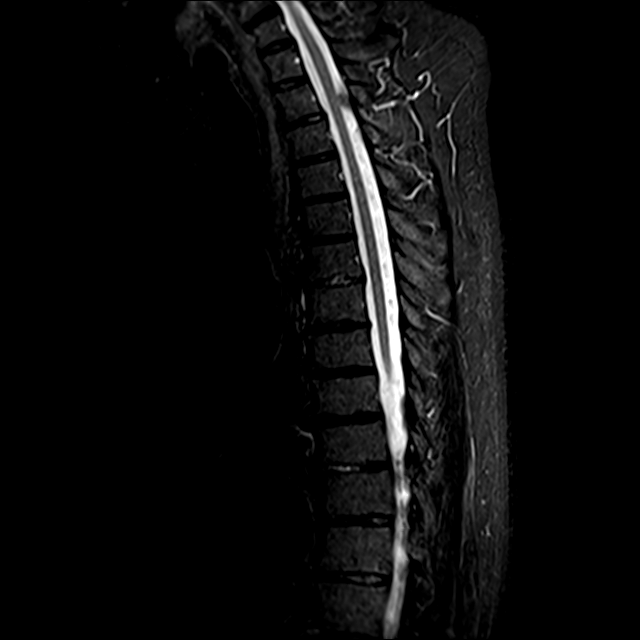
[im 13/17]
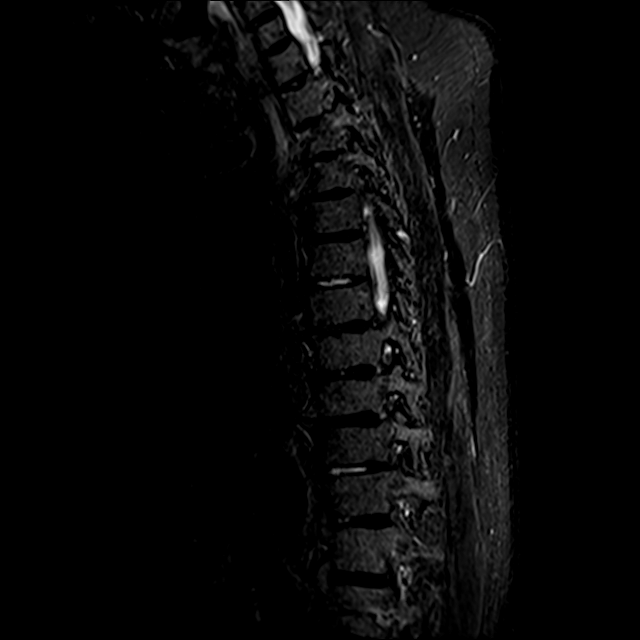
[im 17/17]
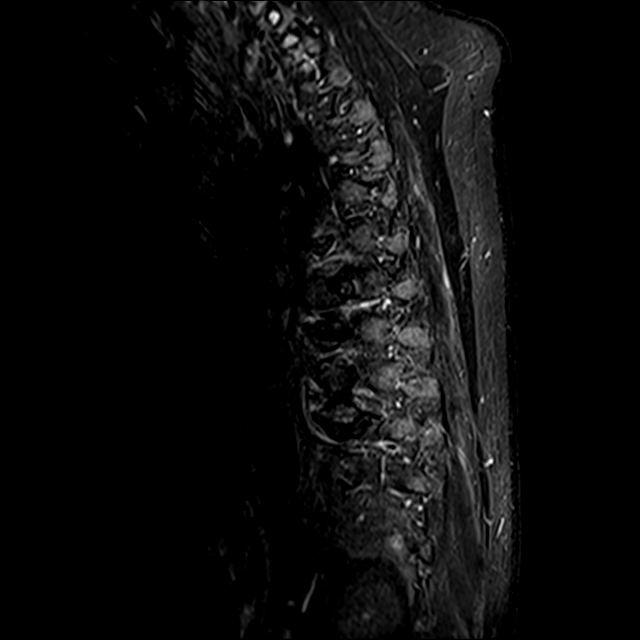

[Series 20: T2 · axial · 4.0mm · 0.59mm/px · z∈[-233,+27]mm · 8 of 39 slices shown (2 of 2)]
[im 1/39]
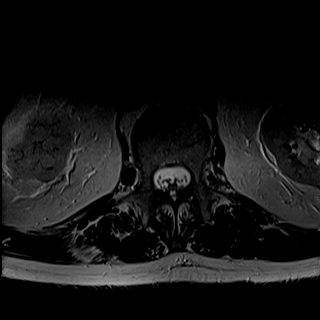
[im 6/39]
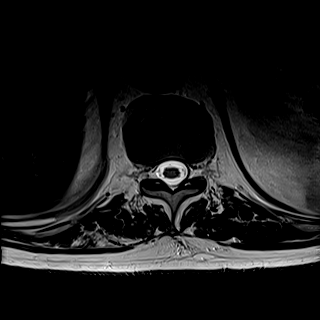
[im 12/39]
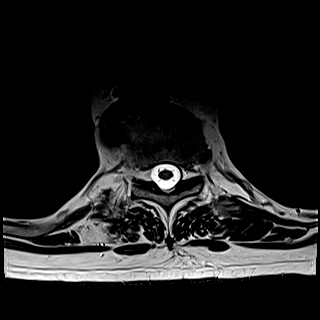
[im 18/39]
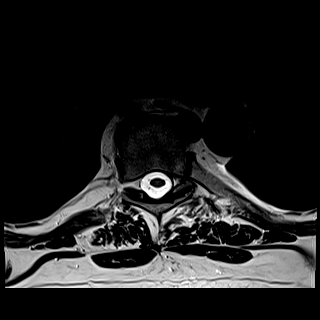
[im 21/39]
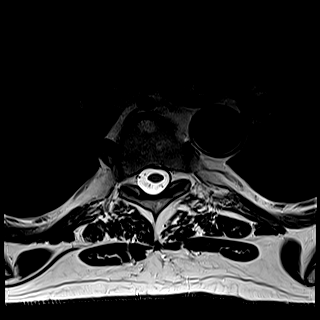
[im 27/39]
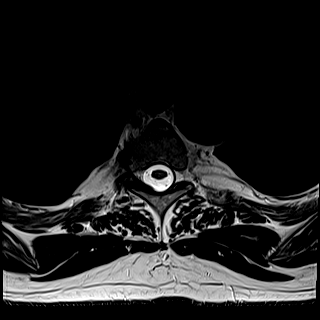
[im 33/39]
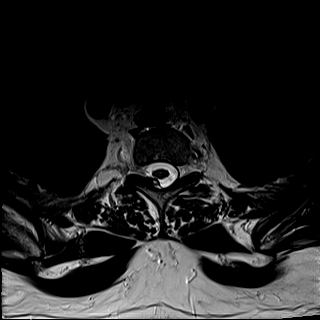
[im 39/39]
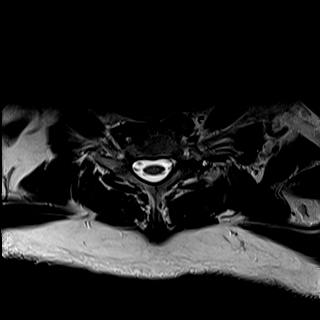

[Series 21: GRE · axial · 4.0mm · 0.37mm/px · z∈[-233,-195]mm · 2 of 39 slices shown]
[im 1/39]
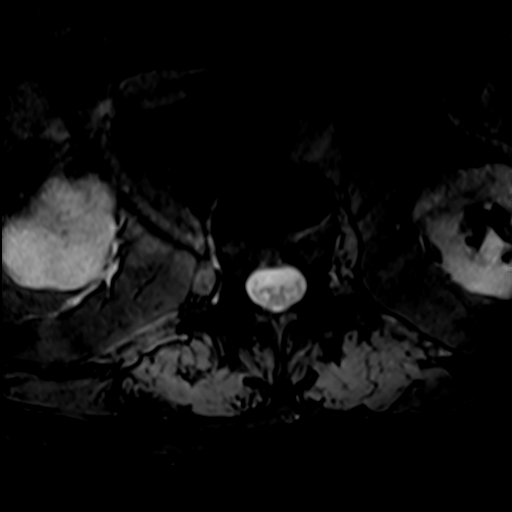
[im 6/39]
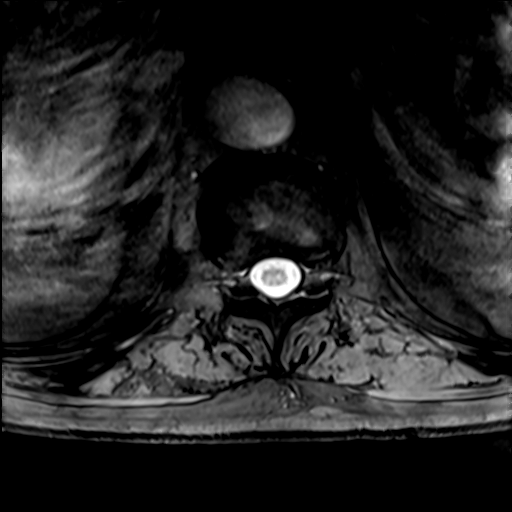

[30 of 48 positions shown; findings below may reference images not displayed]

FINDINGS: Alignment:  Normal alignment.  Mild scoliosis.

Vertebrae: Negative for fracture or mass.  Hemangioma T10.

Cord:  Normal signal and morphology

Paraspinal and other soft tissues: Negative

Disc levels:

T1-2: Negative

T2-3: Mild disc degeneration. Facet hypertrophy on the right without
significant spinal stenosis

T3-4: Negative

T4-5: Negative

T5-6: Small right-sided disc protrusion without cord deformity or
stenosis

T6-7: Disc degeneration with mild endplate spurring. Negative for
stenosis.

T7-8: Disc degeneration with mild endplate spurring on the left.
Mild left foraminal narrowing.

T8-9: Negative

T9-10: Mild disc and mild facet degeneration without stenosis

T10-11: Mild disc and mild facet degeneration. Negative for stenosis

T11-12: Mild facet degeneration without stenosis

T12-L1: Negative
IMPRESSION: No significant spinal stenosis. Mild degenerative changes throughout
the thoracic spine as above.

## 2020-04-27 DIAGNOSIS — I10 Essential (primary) hypertension: Secondary | ICD-10-CM | POA: Diagnosis not present

## 2020-04-27 DIAGNOSIS — I129 Hypertensive chronic kidney disease with stage 1 through stage 4 chronic kidney disease, or unspecified chronic kidney disease: Secondary | ICD-10-CM | POA: Diagnosis not present

## 2020-04-27 DIAGNOSIS — E7800 Pure hypercholesterolemia, unspecified: Secondary | ICD-10-CM | POA: Diagnosis not present

## 2020-04-27 DIAGNOSIS — E1122 Type 2 diabetes mellitus with diabetic chronic kidney disease: Secondary | ICD-10-CM | POA: Diagnosis not present

## 2020-04-27 DIAGNOSIS — N183 Chronic kidney disease, stage 3 unspecified: Secondary | ICD-10-CM | POA: Diagnosis not present

## 2020-05-04 DIAGNOSIS — M791 Myalgia, unspecified site: Secondary | ICD-10-CM | POA: Diagnosis not present

## 2020-05-04 DIAGNOSIS — E1122 Type 2 diabetes mellitus with diabetic chronic kidney disease: Secondary | ICD-10-CM | POA: Diagnosis not present

## 2020-05-04 DIAGNOSIS — Z6835 Body mass index (BMI) 35.0-35.9, adult: Secondary | ICD-10-CM | POA: Diagnosis not present

## 2020-05-04 DIAGNOSIS — G4733 Obstructive sleep apnea (adult) (pediatric): Secondary | ICD-10-CM | POA: Diagnosis not present

## 2020-05-04 DIAGNOSIS — I129 Hypertensive chronic kidney disease with stage 1 through stage 4 chronic kidney disease, or unspecified chronic kidney disease: Secondary | ICD-10-CM | POA: Diagnosis not present

## 2020-05-04 DIAGNOSIS — N183 Chronic kidney disease, stage 3 unspecified: Secondary | ICD-10-CM | POA: Diagnosis not present

## 2020-05-04 DIAGNOSIS — E7800 Pure hypercholesterolemia, unspecified: Secondary | ICD-10-CM | POA: Diagnosis not present

## 2020-05-04 DIAGNOSIS — I779 Disorder of arteries and arterioles, unspecified: Secondary | ICD-10-CM | POA: Diagnosis not present

## 2020-05-04 DIAGNOSIS — T466X5A Adverse effect of antihyperlipidemic and antiarteriosclerotic drugs, initial encounter: Secondary | ICD-10-CM | POA: Diagnosis not present

## 2020-05-04 DIAGNOSIS — I10 Essential (primary) hypertension: Secondary | ICD-10-CM | POA: Diagnosis not present

## 2020-06-03 DIAGNOSIS — R079 Chest pain, unspecified: Secondary | ICD-10-CM | POA: Diagnosis not present

## 2020-06-30 ENCOUNTER — Encounter (INDEPENDENT_AMBULATORY_CARE_PROVIDER_SITE_OTHER): Payer: Self-pay | Admitting: Ophthalmology

## 2020-06-30 ENCOUNTER — Ambulatory Visit (INDEPENDENT_AMBULATORY_CARE_PROVIDER_SITE_OTHER): Payer: PPO | Admitting: Ophthalmology

## 2020-06-30 ENCOUNTER — Other Ambulatory Visit: Payer: Self-pay

## 2020-06-30 DIAGNOSIS — E113412 Type 2 diabetes mellitus with severe nonproliferative diabetic retinopathy with macular edema, left eye: Secondary | ICD-10-CM | POA: Diagnosis not present

## 2020-06-30 DIAGNOSIS — E113411 Type 2 diabetes mellitus with severe nonproliferative diabetic retinopathy with macular edema, right eye: Secondary | ICD-10-CM | POA: Diagnosis not present

## 2020-06-30 DIAGNOSIS — S0501XS Injury of conjunctiva and corneal abrasion without foreign body, right eye, sequela: Secondary | ICD-10-CM

## 2020-06-30 NOTE — Progress Notes (Signed)
06/30/2020     CHIEF COMPLAINT Patient presents for Retina Follow Up (3 Mo F/U OU//Pt c/o blurry DVA OU that is stable, and sts he needs to get new glasses RX to be able to see. No new symptoms OU./A1c: "good"/LBS: "I don't remember")   HISTORY OF PRESENT ILLNESS: Michael Humphrey is a 70 y.o. male who presents to the clinic today for:   HPI     Retina Follow Up           Diagnosis: Diabetic Retinopathy   Laterality: both eyes   Onset: 3 months ago   Severity: mild   Duration: 3 months   Course: stable   Comments: 3 Mo F/U OU  Pt c/o blurry DVA OU that is stable, and sts he needs to get new glasses RX to be able to see. No new symptoms OU. A1c: "good" LBS: "I don't remember"       Last edited by Milly Jakob, [place] on 06/30/2020  8:07 AM.      Referring physician: Kirk Ruths, MD [facility] [place],  [place] [address]  HISTORICAL INFORMATION:   Selected notes from the MEDICAL RECORD NUMBER    Lab Results  Component Value Date   HGBA1C 7.1 (H) 07/15/2017     CURRENT MEDICATIONS: No current outpatient medications on file. (Ophthalmic Drugs)   No current facility-administered medications for this visit. (Ophthalmic Drugs)   Current Outpatient Medications (Other)  Medication Sig   aspirin EC 81 MG tablet Take 81 mg by mouth daily. Swallow whole.   carvedilol (COREG) 12.5 MG tablet Take 12.5 mg by mouth 2 (two) times daily.    finasteride (PROSCAR) 5 MG tablet Take 5 mg by mouth every morning.    gabapentin (NEURONTIN) 600 MG tablet Take 600 mg by mouth 2 (two) times daily.   glimepiride (AMARYL) 2 MG tablet Take 2 mg by mouth daily with breakfast.   lisinopril (PRINIVIL,ZESTRIL) 2.5 MG tablet Take 1 tablet (2.5 mg total) by mouth daily. (Patient taking differently: Take 2.5 mg by mouth every morning.)   metFORMIN (GLUCOPHAGE) 500 MG tablet Take 500 mg by mouth 2 (two) times daily.   torsemide (DEMADEX) 20 MG tablet  Take 20 mg by mouth daily as needed (swelling).   traZODone (DESYREL) 100 MG tablet Take 1 tablet (100 mg total) by mouth at bedtime as needed for sleep.   No current facility-administered medications for this visit. (Other)      REVIEW OF SYSTEMS:    ALLERGIES Allergies  Allergen Reactions   Oxycodone     hallucinations   Clonidine Derivatives     Hallucinations    Fioricet [Butalbital-Apap-Caffeine]     hallucination   Hydrocodone-Acetaminophen     hallucinations   Mobic [Meloxicam]     hallucinations   Norvasc [Amlodipine Besylate]     hallucinations   Statins     Muscle cramps   Tramadol Itching    PAST MEDICAL HISTORY Past Medical History:  Diagnosis Date   Arthritis    Cancer ([place])    SKIN CANCER   Diabetes mellitus without complication ([place])    DVT (deep venous thrombosis) ([place]) 07/2017   nonocclusive DVT left internal jugular    GERD (gastroesophageal reflux disease)    H/O   High cholesterol    Hordeolum internum right lower eyelid 12/09/2019   Hypertension    Lumbar radiculitis    Stroke ([place])    07/2017  Wears dentures    full upper and lower   Past Surgical History:  Procedure Laterality Date   AMPUTATION TOE Left 01/15/2020   Procedure: AMPUTATION TOE MPJ T1;  Surgeon: Samara Deist, DPM;  Location: [facility] ORS;  Service: Podiatry;  Laterality: Left;   BUNIONECTOMY     CATARACT EXTRACTION W/PHACO Left 12/11/2017   Procedure: CATARACT EXTRACTION PHACO AND INTRAOCULAR LENS PLACEMENT ([place])  LEFT DIABETIC;  Surgeon: Leandrew Koyanagi, MD;  Location: [place];  Service: Ophthalmology;  Laterality: Left;  DIABETIC (ACTA picking pt up at 0600, needs 0630 arrival time)   CATARACT EXTRACTION W/PHACO Right 01/15/2018   Procedure: CATARACT EXTRACTION PHACO AND INTRAOCULAR LENS PLACEMENT ([place])  RIGHT DIABETIC  leave so arrival will be around 7:45 ds;  Surgeon: Leandrew Koyanagi, MD;  Location: [place];  Service: Ophthalmology;   Laterality: Right;  Diabetic - oral meds   CHOLECYSTECTOMY     COLONOSCOPY     COLONOSCOPY WITH PROPOFOL N/A 12/17/2014   Procedure: COLONOSCOPY WITH PROPOFOL;  Surgeon: Lollie Sails, MD;  Location: [facility] ENDOSCOPY;  Service: Endoscopy;  Laterality: N/A;   correct hammertoe     FASCIECTOMY Right 10/29/2019   Procedure: PLANTAR FIBROMA RESECTION RIGHT;  Surgeon: Caroline More, DPM;  Location: [place];  Service: Podiatry;  Laterality: Right;  Diabetic - oral meds   JOINT REPLACEMENT     KNEE ARTHROSCOPY Right    LUMBAR LAMINECTOMY/ DECOMPRESSION WITH MET-RX N/A 02/18/2019   Procedure: L4-5 DECOMPRESSION;  Surgeon: Meade Maw, MD;  Location: [facility];  Service: Neurosurgery;  Laterality: N/A;   TOTAL HIP ARTHROPLASTY     TOTAL KNEE ARTHROPLASTY Right     FAMILY HISTORY Family History  Problem Relation Age of Onset   Clotting disorder Father    Hypertension Father     SOCIAL HISTORY Social History   Tobacco Use   Smoking status: Never   Smokeless tobacco: Former    Types: Chew    Quit date: 02/08/1978  Vaping Use   Vaping Use: Never used  Substance Use Topics   Alcohol use: Yes    Comment: RARE   Drug use: No         OPHTHALMIC EXAM:  Base Eye Exam     Visual Acuity (ETDRS)       Right Left   Dist [place] 20/50 +2 20/30   Dist ph [place] NI 20/25 +1         Tonometry (Tonopen, 8:11 AM)       Right Left   Pressure 20 17         Pupils       Pupils Dark Light Shape React APD   Right PERRL 4 3 Round Brisk None   Left PERRL 4 3 Round Brisk None         Visual Fields (Counting fingers)       Left Right    Full Full         Extraocular Movement       Right Left    Full Full         Neuro/Psych     Oriented x3: Yes   Mood/Affect: Normal         Dilation     Both eyes: 1.0% Mydriacyl, 2.5% Phenylephrine @ 8:11 AM           Slit Lamp and Fundus Exam     External Exam       Right Left   External Normal  Normal          Slit Lamp Exam       Right Left   Lids/Lashes Normal Normal   Conjunctiva/Sclera White and quiet White and quiet   Cornea Clear, no signs of abrasion today no epithelial defect no Clear   Anterior Chamber Deep and quiet, no cells Deep and quiet   Iris Round and reactive Round and reactive   Lens Posterior chamber intraocular lens Posterior chamber intraocular lens   Anterior Vitreous Normal, no cells Normal         Fundus Exam       Right Left   Posterior Vitreous Normal Normal   Disc Normal Normal   C/D Ratio 0.0 0.0   Macula Macular thickening temporally, Severe clinically significant macular edema, Exudates in a circinate ring temporally now with good focal laser., Microaneurysms Microaneurysms, no clinically significant macular edema, no focal laser scars   Vessels NPDR-Severe NPDR-Severe   Periphery Normal, good retinopexy to tear at 1030 location,  Normal            IMAGING AND PROCEDURES  Imaging and Procedures for 06/30/20  OCT, Retina - OU - Both Eyes       Right Eye Quality was borderline. Scan locations included subfoveal. Progression has been stable. Findings include abnormal foveal contour, cystoid macular edema.   Left Eye Quality was good. Scan locations included subfoveal. Findings include normal foveal contour, no IRF, no SRF, retinal drusen .   Notes CSME temporally OD persists not center threatening but not involved.  Unable to obtain a map due to ocular movements dilated and nondilated  OS no active CSME             ASSESSMENT/PLAN:  Severe nonproliferative diabetic retinopathy of right eye, with macular edema, associated with type 2 diabetes mellitus ([place]) Status post recent focal laser, March 2022.  Smaller circinate ring yet still active temporal to fovea will observe  Severe nonproliferative diabetic retinopathy of left eye, with macular edema, associated with type 2 diabetes mellitus ([place]) Much improved by clinical  examination and on OCT will observe OS  Corneal abrasion, right, sequela Resolved     ICD-10-CM   1. Severe nonproliferative diabetic retinopathy of right eye, with macular edema, associated with type 2 diabetes mellitus (HCC)  E11.3411 OCT, Retina - OU - Both Eyes    2. Severe nonproliferative diabetic retinopathy of left eye, with macular edema, associated with type 2 diabetes mellitus (HCC)  N56.2130 OCT, Retina - OU - Both Eyes    3. Corneal abrasion, right, sequela  S05.01XS       1.  Severe NPDR persist OU  2.  CSME OD improved after focal laser treatment some 3 and half months prior.  We will continue to monitor for further resolution  3.  Active CSME OS clinically or by OCT Will observe  Ophthalmic Meds Ordered this visit:  No orders of the defined types were placed in this encounter.      Return in about 6 months (around 12/30/2020) for DILATE OU, OCT, COLOR FP.  There are no Patient Instructions on file for this visit.   Explained the diagnoses, plan, and follow up with the patient and they expressed understanding.  Patient expressed understanding of the importance of proper follow up care.   Clent Demark Iliana Hutt M.D. Diseases & Surgery of the Retina and Vitreous [facility] [place] 06/30/20     Abbreviations: M myopia (nearsighted); A astigmatism; H hyperopia (  farsighted); P presbyopia; Mrx spectacle prescription;  CTL contact lenses; OD right eye; OS left eye; OU both eyes  XT exotropia; ET esotropia; PEK punctate epithelial keratitis; PEE punctate epithelial erosions; DES dry eye syndrome; MGD meibomian gland dysfunction; ATs artificial tears; PFAT's preservative free artificial tears; [place] nuclear sclerotic cataract; PSC posterior subcapsular cataract; ERM epi-retinal membrane; PVD posterior vitreous detachment; RD retinal detachment; DM diabetes mellitus; DR diabetic retinopathy; NPDR non-proliferative diabetic retinopathy; PDR proliferative diabetic  retinopathy; CSME clinically significant macular edema; DME diabetic macular edema; dbh dot blot hemorrhages; CWS cotton wool spot; POAG primary open angle glaucoma; C/D cup-to-disc ratio; HVF humphrey visual field; GVF goldmann visual field; OCT optical coherence tomography; IOP intraocular pressure; BRVO Branch retinal vein occlusion; CRVO central retinal vein occlusion; CRAO central retinal artery occlusion; BRAO branch retinal artery occlusion; RT retinal tear; SB scleral buckle; PPV pars plana vitrectomy; VH Vitreous hemorrhage; PRP panretinal laser photocoagulation; IVK intravitreal kenalog; VMT vitreomacular traction; MH Macular hole;  NVD neovascularization of the disc; NVE neovascularization elsewhere; AREDS age related eye disease study; ARMD age related macular degeneration; POAG primary open angle glaucoma; EBMD epithelial/anterior basement membrane dystrophy; ACIOL anterior chamber intraocular lens; IOL intraocular lens; PCIOL posterior chamber intraocular lens; Phaco/IOL phacoemulsification with intraocular lens placement; [place] photorefractive keratectomy; LASIK laser assisted in situ keratomileusis; HTN hypertension; DM diabetes mellitus; COPD chronic obstructive pulmonary disease

## 2020-06-30 NOTE — Assessment & Plan Note (Signed)
Status post recent focal laser, March 2022.  Smaller circinate ring yet still active temporal to fovea will observe

## 2020-06-30 NOTE — Assessment & Plan Note (Signed)
Much improved by clinical examination and on OCT will observe OS

## 2020-06-30 NOTE — Assessment & Plan Note (Signed)
Resolved

## 2020-07-02 ENCOUNTER — Other Ambulatory Visit: Payer: Self-pay

## 2020-07-02 ENCOUNTER — Emergency Department: Payer: PPO

## 2020-07-02 DIAGNOSIS — W19XXXA Unspecified fall, initial encounter: Secondary | ICD-10-CM | POA: Insufficient documentation

## 2020-07-02 DIAGNOSIS — M25551 Pain in right hip: Secondary | ICD-10-CM | POA: Diagnosis not present

## 2020-07-02 DIAGNOSIS — M79651 Pain in right thigh: Secondary | ICD-10-CM | POA: Diagnosis not present

## 2020-07-02 DIAGNOSIS — Z96649 Presence of unspecified artificial hip joint: Secondary | ICD-10-CM | POA: Diagnosis not present

## 2020-07-02 DIAGNOSIS — Z96651 Presence of right artificial knee joint: Secondary | ICD-10-CM | POA: Insufficient documentation

## 2020-07-02 DIAGNOSIS — Z89422 Acquired absence of other left toe(s): Secondary | ICD-10-CM | POA: Insufficient documentation

## 2020-07-02 DIAGNOSIS — I10 Essential (primary) hypertension: Secondary | ICD-10-CM | POA: Insufficient documentation

## 2020-07-02 DIAGNOSIS — Z87891 Personal history of nicotine dependence: Secondary | ICD-10-CM | POA: Diagnosis not present

## 2020-07-02 DIAGNOSIS — Z7982 Long term (current) use of aspirin: Secondary | ICD-10-CM | POA: Diagnosis not present

## 2020-07-02 DIAGNOSIS — Z7984 Long term (current) use of oral hypoglycemic drugs: Secondary | ICD-10-CM | POA: Diagnosis not present

## 2020-07-02 DIAGNOSIS — R609 Edema, unspecified: Secondary | ICD-10-CM | POA: Diagnosis not present

## 2020-07-02 DIAGNOSIS — E113413 Type 2 diabetes mellitus with severe nonproliferative diabetic retinopathy with macular edema, bilateral: Secondary | ICD-10-CM | POA: Insufficient documentation

## 2020-07-02 DIAGNOSIS — Z85828 Personal history of other malignant neoplasm of skin: Secondary | ICD-10-CM | POA: Diagnosis not present

## 2020-07-02 DIAGNOSIS — Z79899 Other long term (current) drug therapy: Secondary | ICD-10-CM | POA: Insufficient documentation

## 2020-07-02 DIAGNOSIS — R52 Pain, unspecified: Secondary | ICD-10-CM | POA: Diagnosis not present

## 2020-07-02 MED ORDER — ACETAMINOPHEN 325 MG PO TABS
650.0000 mg | ORAL_TABLET | Freq: Once | ORAL | Status: AC | PRN
  Administered 2020-07-02: 650 mg via ORAL
  Filled 2020-07-02: qty 2

## 2020-07-02 NOTE — ED Triage Notes (Signed)
FIRST NURSE NOTE:   Pt arrived via [facility] from home, s/p fall 2 hours ago, pt fell onto cement c/o R hip pain.

## 2020-07-02 NOTE — ED Triage Notes (Addendum)
Pt states he fell tonight landing on right hip. Pt complains of right hip pain extending down to mid thigh, no knee pain.  Pt denies loc, denies head injury. Pt states is not able to bear weight on leg.

## 2020-07-03 ENCOUNTER — Emergency Department: Payer: PPO

## 2020-07-03 ENCOUNTER — Emergency Department
Admission: EM | Admit: 2020-07-03 | Discharge: 2020-07-03 | Disposition: A | Discharge status: Home / Self Care | Payer: PPO | Attending: Emergency Medicine | Admitting: Emergency Medicine

## 2020-07-03 DIAGNOSIS — W19XXXA Unspecified fall, initial encounter: Secondary | ICD-10-CM

## 2020-07-03 DIAGNOSIS — M25551 Pain in right hip: Secondary | ICD-10-CM

## 2020-07-03 MED ORDER — OXYCODONE-ACETAMINOPHEN 5-325 MG PO TABS
2.0000 | ORAL_TABLET | Freq: Once | ORAL | Status: AC
  Administered 2020-07-03: 2 via ORAL
  Filled 2020-07-03: qty 2

## 2020-07-03 NOTE — Discharge Instructions (Addendum)
As we discussed, your work-up was reassuring, with both x-rays and CT scan that did not show any sign of acute injury.  Although you are still uncomfortable, we gave you the option of additional pain treatment or discharge home and you prefer to go home.  It is very important, however, that you contact the office of Dr. Marry Guan to schedule the next available follow-up appointment, particularly if you are still having pain.  Use over-the-counter medication as needed and according to label instructions since it sounds like you frequently have side effects to stronger pain medication.  Return immediately to the emergency department if you develop new or worsening symptoms, such as worsening pain, new numbness or tingling, the feeling that your leg is getting cold, increased swelling, etc.

## 2020-07-03 NOTE — ED Notes (Signed)
Patient states that he fell about 3:00pm and has been unable to walk since. Patient with complaint of left upper left and hip pain. Patient 2+ bilateral pedal pulses.

## 2020-07-03 NOTE — ED Provider Notes (Signed)
[facility] Emergency Department Provider Note  ____________________________________________   Event Date/Time   First MD Initiated Contact with Patient 07/03/20 0107     (approximate)  I have reviewed the triage vital signs and the nursing notes.   HISTORY  Chief Complaint Fall    HPI Michael Humphrey is a 70 y.o. male who presents for evaluation of severe pain in the right hip and thigh after a fall.  He said that the fall occurred earlier this afternoon when he tripped over some cords or cables.  He was able to get back up and he went on about the rest of his afternoon and evening and even drove himself home.  However he said that after he got home he felt like he was no longer able to walk on it because the pain had become so severe.  He has no numbness nor tingling.  His leg feels normally warm (not cold or too hot).  The pain feels worse with any amount of movement and better with rest.  He denies fever/chills, sore throat, chest pain, shortness of breath, nausea, vomiting, and abdominal pain.  He has had a prior hip replacement on the right side.  Dr. Marry Guan is his orthopedic surgeon.     Past Medical History:  Diagnosis Date   Arthritis    Cancer [facility])    SKIN CANCER   Diabetes mellitus without complication ([place])    DVT (deep venous thrombosis) ([place]) 07/2017   nonocclusive DVT left internal jugular    GERD (gastroesophageal reflux disease)    H/O   High cholesterol    Hordeolum internum right lower eyelid 12/09/2019   Hypertension    Lumbar radiculitis    Stroke ([place])    07/2017   Wears dentures    full upper and lower    Patient Active Problem List   Diagnosis Date Noted   Peripheral retinal hole, right 12/10/2019   Lower limb ulcer, calf, right, limited to breakdown of skin ([place]) 09/04/2019   Severe nonproliferative diabetic retinopathy of right eye, with macular edema, associated with type 2 diabetes mellitus ([place]) 05/26/2019    Severe nonproliferative diabetic retinopathy of left eye, with macular edema, associated with type 2 diabetes mellitus ([place]) 05/26/2019   Hematuria, gross    Pulmonary embolism ([place]) 02/26/2018   Dizziness 02/26/2018   CVA (cerebral vascular accident) ([place]) 07/15/2017    Past Surgical History:  Procedure Laterality Date   AMPUTATION TOE Left 01/15/2020   Procedure: AMPUTATION TOE MPJ T1;  Surgeon: Samara Deist, DPM;  Location: [facility] ORS;  Service: Podiatry;  Laterality: Left;   BUNIONECTOMY     CATARACT EXTRACTION W/PHACO Left 12/11/2017   Procedure: CATARACT EXTRACTION PHACO AND INTRAOCULAR LENS PLACEMENT ([place])  LEFT DIABETIC;  Surgeon: Leandrew Koyanagi, MD;  Location: [place];  Service: Ophthalmology;  Laterality: Left;  DIABETIC ([facility] picking pt up at 0600, needs 0630 arrival time)   CATARACT EXTRACTION W/PHACO Right 01/15/2018   Procedure: CATARACT EXTRACTION PHACO AND INTRAOCULAR LENS PLACEMENT ([place])  RIGHT DIABETIC  leave so arrival will be around 7:45 ds;  Surgeon: Leandrew Koyanagi, MD;  Location: [place];  Service: Ophthalmology;  Laterality: Right;  Diabetic - oral meds   CHOLECYSTECTOMY     COLONOSCOPY     COLONOSCOPY WITH PROPOFOL N/A 12/17/2014   Procedure: COLONOSCOPY WITH PROPOFOL;  Surgeon: Lollie Sails, MD;  Location: [facility];  Service: Endoscopy;  Laterality: N/A;   correct hammertoe  FASCIECTOMY Right 10/29/2019   Procedure: PLANTAR FIBROMA RESECTION RIGHT;  Surgeon: Caroline More, DPM;  Location: [place];  Service: Podiatry;  Laterality: Right;  Diabetic - oral meds   JOINT REPLACEMENT     KNEE ARTHROSCOPY Right    LUMBAR LAMINECTOMY/ DECOMPRESSION WITH MET-RX N/A 02/18/2019   Procedure: L4-5 DECOMPRESSION;  Surgeon: Meade Maw, MD;  Location: [facility] ORS;  Service: Neurosurgery;  Laterality: N/A;   TOTAL HIP ARTHROPLASTY     TOTAL KNEE ARTHROPLASTY Right     Prior to Admission medications   Medication  Sig Start Date End Date Taking? Authorizing Provider  aspirin EC 81 MG tablet Take 81 mg by mouth daily. Swallow whole.    [provider]  carvedilol (COREG) 12.5 MG tablet Take 12.5 mg by mouth 2 (two) times daily.  12/04/17   [provider]  finasteride (PROSCAR) 5 MG tablet Take 5 mg by mouth every morning.     [provider]  gabapentin (NEURONTIN) 600 MG tablet Take 600 mg by mouth 2 (two) times daily.    [provider]  glimepiride (AMARYL) 2 MG tablet Take 2 mg by mouth daily with breakfast.    [provider]  lisinopril (PRINIVIL,ZESTRIL) 2.5 MG tablet Take 1 tablet (2.5 mg total) by mouth daily. Patient taking differently: Take 2.5 mg by mouth every morning. 02/28/18   Henreitta Leber, MD  metFORMIN (GLUCOPHAGE) 500 MG tablet Take 500 mg by mouth 2 (two) times daily.    [provider]  torsemide (DEMADEX) 20 MG tablet Take 20 mg by mouth daily as needed (swelling).    [provider]  traZODone (DESYREL) 100 MG tablet Take 1 tablet (100 mg total) by mouth at bedtime as needed for sleep. 07/17/17 10/22/19  Saundra Shelling, MD    Allergies Oxycodone, Clonidine derivatives, Fioricet [butalbital-apap-caffeine], Hydrocodone-acetaminophen, Mobic [meloxicam], Norvasc [amlodipine besylate], Statins, and Tramadol  Family History  Problem Relation Age of Onset   Clotting disorder Father    Hypertension Father     Social History Social History   Tobacco Use   Smoking status: Never   Smokeless tobacco: Former    Types: Chew    Quit date: 02/08/1978  Vaping Use   Vaping Use: Never used  Substance Use Topics   Alcohol use: Yes    Comment: RARE   Drug use: No    Review of Systems Constitutional: No fever/chills Eyes: No visual changes. ENT: No sore throat. Cardiovascular: Denies chest pain. Respiratory: Denies shortness of breath. Gastrointestinal: No abdominal pain.  No nausea, no vomiting.  No diarrhea.  No  constipation. Genitourinary: Negative for dysuria. Musculoskeletal: Positive for pain in his right hip and thigh. Integumentary: Negative for rash. Neurological: Negative for headaches, focal weakness or numbness.   ____________________________________________   PHYSICAL EXAM:  VITAL SIGNS: ED Triage Vitals  Enc Vitals Group     BP 07/02/20 2010 (!) 149/98     Pulse Rate 07/02/20 2010 86     Resp 07/02/20 2010 16     Temp 07/02/20 2010 98.3 F (36.8 C)     Temp Source 07/02/20 2010 Oral     SpO2 07/02/20 2010 100 %     Weight 07/02/20 2011 94.3 kg (208 lb)     Height 07/02/20 2011 1.753 m (_0 )     Head Circumference --      Peak Flow --      Pain Score 07/02/20 2011 10     Pain Loc --  Pain Edu? --      Excl. in [place]? --     Constitutional: Alert and oriented.  Eyes: Conjunctivae are normal.  Head: Atraumatic. Nose: No congestion/rhinnorhea. Mouth/Throat: Patient is wearing a mask. Neck: No stridor.  No meningeal signs.   Cardiovascular: Normal rate, regular rhythm. Good peripheral circulation. Respiratory: Normal respiratory effort.  No retractions. Gastrointestinal: Soft and nontender. No distention.  Musculoskeletal: No visible deformities noted in the patient's hip and thigh, nor in the knee or the lower leg.  The patient has pain and tenderness when he tries to move around.  His muscles are tense but the compartments are still soft and compressible and not consistent with compartment syndrome.  There is no obvious hematoma or ecchymosis.  His leg is warm and well-perfused.  No obvious swelling.  Easily palpable distal pulse. Neurologic:  Normal speech and language. No gross focal neurologic deficits are appreciated.  Skin:  Skin is warm, dry and intact. Psychiatric: Mood and affect are normal. Speech and behavior are normal.  ____________________________________________   LABS (all labs ordered are listed, but only abnormal results are displayed)  Labs  Reviewed - No data to display ____________________________________________   RADIOLOGY I, Hinda Kehr, personally viewed and evaluated these images (plain radiographs) as part of my medical decision making, as well as reviewing the written report by the radiologist.  ED MD interpretation: No acute abnormalities identified on x-ray nor on CT scan.  Official radiology report(s): CT Hip Right Wo Contrast  Result Date: 07/03/2020 CLINICAL DATA:  [place] tonight on the right hip. Right hip pain. Fracture is suspected with negative x-ray. EXAM: CT OF THE RIGHT HIP WITHOUT CONTRAST TECHNIQUE: Multidetector CT imaging of the right hip was performed according to the standard protocol. Multiplanar CT image reconstructions were also generated. COMPARISON:  Right hip radiographs 07/02/2020 FINDINGS: Bones/Joint/Cartilage Right total hip arthroplasty. Streak artifact somewhat limits evaluation. The components of the arthroplasty appear well seated. No evidence of acute fracture or dislocation of the right hip. Visualized acetabulum and pubic rami appear intact. Ligaments Suboptimally assessed by CT. Muscles and Tendons Visualized muscles and tendons demonstrate no evidence of mass or hematoma. Streak artifact from the prosthesis limits evaluation. Soft tissues No soft tissue collection or infiltration is identified. IMPRESSION: Right total hip arthroplasty. Components appear well seated without evidence of acute fracture or dislocation. Streak artifact limits examination. Electronically Signed   By: Lucienne Capers M.D.   On: 07/03/2020 00:50   DG Hip Unilat  With Pelvis 2-3 Views Right  Result Date: 07/02/2020 CLINICAL DATA:  Acute RIGHT hip pain following fall. Initial encounter. EXAM: DG HIP (WITH OR WITHOUT PELVIS) 2-3V RIGHT COMPARISON:  11/05/2011 radiograph FINDINGS: No acute fracture or dislocation. Bilateral hip arthroplasties again noted. No suspicious bony lesions are present. Degenerative changes of the  LOWER lumbar spine noted. IMPRESSION: No acute abnormality. Electronically Signed   By: Margarette Canada M.D.   On: 07/02/2020 20:54    ____________________________________________   INITIAL IMPRESSION / MDM / ASSESSMENT AND PLAN / ED COURSE  As part of my medical decision making, I reviewed the following data within the [place] notes reviewed and incorporated, Old chart reviewed, and Notes from prior ED visits   Differential diagnosis includes, but is not limited to, musculoskeletal strain, hip/femur fracture, hardware displacement, hip dislocation, hematoma, compartment syndrome.  I personally reviewed the patient's imaging and agree with the radiologist's interpretation that there is no evidence of acute fracture on x-ray.  However  the patient was in severe enough pain that I proceeded with a CT scan of the right hip.  The radiologist did not interpret any acute abnormalities including no fluid collection suggestive of a bleed/hematoma and no fractures or other bony disruptions.  The patient has a reaction of "hallucinations" listed to a number of medications, including most pain medications.  However I felt it was necessary to at least try so I gave him 2 Percocet.  He had a bit of arrest, showed no sign of hallucinations, and said he felt a little bit better.  I gave him the choice of some additional treatment and observation or going home and he said that he would very much like to go home and said that it feels better, it just remains sore.  I suggested that we trial him with ambulation with assistance but he said he just wants to go home and that he has a Piatt and crutches at home.  I gave serious consideration on 2 separate physical exam/assessment of the patient about the possibility of compartment syndrome, but there is nothing at this point to suggest he is having an emergent medical condition.  He seems equally tender with palpation of the anterior, posterior,  and medial compartments of the thigh.  It feels as if he is flexing his muscles rather consistently and constantly.  He has pain but no paresthesia, pallor, paralysis, nor pulselessness.  Additionally, the mechanism was minimal, there was no crush injury, there is no evidence of hematoma or other space-occupying lesion on his CT scan, and I cannot understand why he would have developed compartment syndrome after a relatively minor fall.  The patient wants to go home and given the very low chance that there is an emergent medical condition, I think this is appropriate.  However I gave him strict return precautions, both verbal and written, and I also sent a message to Dr. Marry Guan through [facility] so that he would be aware of the patient's visit and could perhaps have his staff reach out to the patient to schedule follow-up appointment.  However I am hopeful if the patient's symptoms worsen he will return immediately to the emergency department as I recommended.         ____________________________________________  FINAL CLINICAL IMPRESSION(S) / ED DIAGNOSES  Final diagnoses:  Fall, initial encounter  Acute right hip pain     MEDICATIONS GIVEN DURING THIS VISIT:  Medications  acetaminophen (TYLENOL) tablet 650 mg (650 mg Oral Given 07/02/20 2232)  oxyCODONE-acetaminophen (PERCOCET/ROXICET) 5-325 MG per tablet 2 tablet (2 tablets Oral Given 07/03/20 0152)     ED Discharge Orders     None        Note:  This document was prepared using Dragon voice recognition software and may include unintentional dictation errors.   Hinda Kehr, MD 07/03/20 [phone]

## 2020-07-03 NOTE — ED Notes (Signed)
The patient reports his friend is going to be driving him home

## 2020-07-03 NOTE — ED Notes (Signed)
Patient resting quietly with eyes closed in no acute distress at this time.

## 2020-07-05 ENCOUNTER — Encounter (INDEPENDENT_AMBULATORY_CARE_PROVIDER_SITE_OTHER): Payer: PPO | Admitting: Ophthalmology

## 2020-07-07 DIAGNOSIS — S80911A Unspecified superficial injury of right knee, initial encounter: Secondary | ICD-10-CM | POA: Diagnosis not present

## 2020-07-07 DIAGNOSIS — I679 Cerebrovascular disease, unspecified: Secondary | ICD-10-CM | POA: Diagnosis not present

## 2020-07-07 DIAGNOSIS — I129 Hypertensive chronic kidney disease with stage 1 through stage 4 chronic kidney disease, or unspecified chronic kidney disease: Secondary | ICD-10-CM | POA: Diagnosis not present

## 2020-07-07 DIAGNOSIS — S7011XA Contusion of right thigh, initial encounter: Secondary | ICD-10-CM | POA: Diagnosis not present

## 2020-07-07 DIAGNOSIS — Z96652 Presence of left artificial knee joint: Secondary | ICD-10-CM | POA: Diagnosis not present

## 2020-07-07 DIAGNOSIS — G4733 Obstructive sleep apnea (adult) (pediatric): Secondary | ICD-10-CM | POA: Diagnosis not present

## 2020-07-07 DIAGNOSIS — M791 Myalgia, unspecified site: Secondary | ICD-10-CM | POA: Diagnosis not present

## 2020-07-07 DIAGNOSIS — Z96651 Presence of right artificial knee joint: Secondary | ICD-10-CM | POA: Diagnosis not present

## 2020-07-07 DIAGNOSIS — R079 Chest pain, unspecified: Secondary | ICD-10-CM | POA: Diagnosis not present

## 2020-07-07 DIAGNOSIS — E7800 Pure hypercholesterolemia, unspecified: Secondary | ICD-10-CM | POA: Diagnosis not present

## 2020-07-07 DIAGNOSIS — Z6835 Body mass index (BMI) 35.0-35.9, adult: Secondary | ICD-10-CM | POA: Diagnosis not present

## 2020-07-07 DIAGNOSIS — E1122 Type 2 diabetes mellitus with diabetic chronic kidney disease: Secondary | ICD-10-CM | POA: Diagnosis not present

## 2020-07-07 DIAGNOSIS — I779 Disorder of arteries and arterioles, unspecified: Secondary | ICD-10-CM | POA: Diagnosis not present

## 2020-07-07 DIAGNOSIS — I10 Essential (primary) hypertension: Secondary | ICD-10-CM | POA: Diagnosis not present

## 2020-07-07 DIAGNOSIS — Z96643 Presence of artificial hip joint, bilateral: Secondary | ICD-10-CM | POA: Diagnosis not present

## 2020-07-07 DIAGNOSIS — T466X5A Adverse effect of antihyperlipidemic and antiarteriosclerotic drugs, initial encounter: Secondary | ICD-10-CM | POA: Diagnosis not present

## 2020-07-07 DIAGNOSIS — S80912A Unspecified superficial injury of left knee, initial encounter: Secondary | ICD-10-CM | POA: Diagnosis not present

## 2020-07-16 ENCOUNTER — Emergency Department: Payer: PPO

## 2020-07-16 ENCOUNTER — Other Ambulatory Visit: Payer: Self-pay

## 2020-07-16 ENCOUNTER — Emergency Department
Admission: EM | Admit: 2020-07-16 | Discharge: 2020-07-16 | Disposition: A | Discharge status: Home / Self Care | Payer: PPO | Attending: Emergency Medicine | Admitting: Emergency Medicine

## 2020-07-16 DIAGNOSIS — I10 Essential (primary) hypertension: Secondary | ICD-10-CM | POA: Insufficient documentation

## 2020-07-16 DIAGNOSIS — M79604 Pain in right leg: Secondary | ICD-10-CM | POA: Diagnosis not present

## 2020-07-16 DIAGNOSIS — Z7982 Long term (current) use of aspirin: Secondary | ICD-10-CM | POA: Diagnosis not present

## 2020-07-16 DIAGNOSIS — Z96649 Presence of unspecified artificial hip joint: Secondary | ICD-10-CM | POA: Insufficient documentation

## 2020-07-16 DIAGNOSIS — Z87891 Personal history of nicotine dependence: Secondary | ICD-10-CM | POA: Insufficient documentation

## 2020-07-16 DIAGNOSIS — E119 Type 2 diabetes mellitus without complications: Secondary | ICD-10-CM | POA: Insufficient documentation

## 2020-07-16 DIAGNOSIS — Z7984 Long term (current) use of oral hypoglycemic drugs: Secondary | ICD-10-CM | POA: Diagnosis not present

## 2020-07-16 DIAGNOSIS — Z85828 Personal history of other malignant neoplasm of skin: Secondary | ICD-10-CM | POA: Diagnosis not present

## 2020-07-16 DIAGNOSIS — L03115 Cellulitis of right lower limb: Secondary | ICD-10-CM | POA: Insufficient documentation

## 2020-07-16 DIAGNOSIS — Z79899 Other long term (current) drug therapy: Secondary | ICD-10-CM | POA: Insufficient documentation

## 2020-07-16 DIAGNOSIS — R600 Localized edema: Secondary | ICD-10-CM | POA: Diagnosis not present

## 2020-07-16 DIAGNOSIS — Z96651 Presence of right artificial knee joint: Secondary | ICD-10-CM | POA: Diagnosis not present

## 2020-07-16 LAB — CBC WITH DIFFERENTIAL/PLATELET
Abs Immature Granulocytes: 0.03 10*3/uL (ref 0.00–0.07)
Basophils Absolute: 0.1 10*3/uL (ref 0.0–0.1)
Basophils Relative: 1 %
Eosinophils Absolute: 0.2 10*3/uL (ref 0.0–0.5)
Eosinophils Relative: 3 %
HCT: 37.6 % — ABNORMAL LOW (ref 39.0–52.0)
Hemoglobin: 12.4 g/dL — ABNORMAL LOW (ref 13.0–17.0)
Immature Granulocytes: 0 %
Lymphocytes Relative: 19 %
Lymphs Abs: 1.3 10*3/uL (ref 0.7–4.0)
MCH: 32.2 pg (ref 26.0–34.0)
MCHC: 33 g/dL (ref 30.0–36.0)
MCV: 97.7 fL (ref 80.0–100.0)
Monocytes Absolute: 0.7 10*3/uL (ref 0.1–1.0)
Monocytes Relative: 10 %
Neutro Abs: 4.6 10*3/uL (ref 1.7–7.7)
Neutrophils Relative %: 67 %
Platelets: 209 10*3/uL (ref 150–400)
RBC: 3.85 MIL/uL — ABNORMAL LOW (ref 4.22–5.81)
RDW: 14.6 % (ref 11.5–15.5)
WBC: 6.9 10*3/uL (ref 4.0–10.5)
nRBC: 0 % (ref 0.0–0.2)

## 2020-07-16 LAB — COMPREHENSIVE METABOLIC PANEL
ALT: 20 U/L (ref 0–44)
AST: 22 U/L (ref 15–41)
Albumin: 4 g/dL (ref 3.5–5.0)
Alkaline Phosphatase: 110 U/L (ref 38–126)
Anion gap: 7 (ref 5–15)
BUN: 23 mg/dL (ref 8–23)
CO2: 24 mmol/L (ref 22–32)
Calcium: 8.9 mg/dL (ref 8.9–10.3)
Chloride: 105 mmol/L (ref 98–111)
Creatinine, Ser: 1.03 mg/dL (ref 0.61–1.24)
GFR, Estimated: >60 mL/min (ref >60)
Glucose, Bld: 92 mg/dL (ref 70–99)
Potassium: 4 mmol/L (ref 3.5–5.1)
Sodium: 136 mmol/L (ref 135–145)
Total Bilirubin: 1.1 mg/dL (ref 0.3–1.2)
Total Protein: 7 g/dL (ref 6.5–8.1)

## 2020-07-16 MED ORDER — ACETAMINOPHEN 500 MG PO TABS
1000.0000 mg | ORAL_TABLET | Freq: Once | ORAL | Status: AC
  Administered 2020-07-16: 1000 mg via ORAL
  Filled 2020-07-16: qty 2

## 2020-07-16 MED ORDER — METHOCARBAMOL 500 MG PO TABS: 500.0000 mg | ORAL_TABLET | Freq: Three times a day (TID) | ORAL | 0 refills | Status: DC | PRN

## 2020-07-16 MED ORDER — CEPHALEXIN 500 MG PO CAPS
500.0000 mg | ORAL_CAPSULE | Freq: Once | ORAL | Status: AC
  Administered 2020-07-16: 500 mg via ORAL
  Filled 2020-07-16: qty 1

## 2020-07-16 MED ORDER — GABAPENTIN 300 MG PO CAPS
600.0000 mg | ORAL_CAPSULE | Freq: Once | ORAL | Status: AC
  Administered 2020-07-16: 600 mg via ORAL
  Filled 2020-07-16: qty 2

## 2020-07-16 MED ORDER — METHOCARBAMOL 500 MG PO TABS
500.0000 mg | ORAL_TABLET | Freq: Once | ORAL | Status: AC
  Administered 2020-07-16: 500 mg via ORAL
  Filled 2020-07-16: qty 1

## 2020-07-16 MED ORDER — CEPHALEXIN 500 MG PO CAPS: 500.0000 mg | ORAL_CAPSULE | Freq: Four times a day (QID) | ORAL | 0 refills | Status: AC

## 2020-07-16 NOTE — Discharge Instructions (Addendum)
Use Tylenol for pain and fevers.  Up to 1000 mg per dose, up to 4 times per day.  Do not take more than 4000 mg of Tylenol/acetaminophen within 24 hours..  Continue to take your gabapentin at home.  For more severe and breakthrough pain, you can utilize the Robaxin muscle relaxer that I prescribed for you, or the oxycodone you already have at home.  Please do not mix these 2 medications as it can make you quite sleepy.  Follow-up with Dr. Ouida Sills on Monday.  If you develop any further worsening symptoms such as uncontrolled pain, fevers or feeling sick overall, please return to the ED.

## 2020-07-16 NOTE — ED Provider Notes (Signed)
[facility] Emergency Department Provider Note ____________________________________________   Event Date/Time   First MD Initiated Contact with Patient 07/16/20 [phone]     (approximate)  I have reviewed the triage vital signs and the nursing notes.  HISTORY  Chief Complaint leg redness   HPI Michael Humphrey is a 70 y.o. malewho presents to the ED for evaluation of leg erythema.  Chart review indicates HTN, HLD, DM, obesity and OSA.  Patient presents to the ED, accompanied by his wife, for evaluation of erythema to his distal right leg and associated pain.  He reports a mechanical fall that occurred about 2 weeks ago, for which she saw his orthopedic surgeon, had imaging without evidence of acute fracture or dislocation, and was provided analgesia.  Since then, he reports of bruising to his right leg that has been improving slowly, as well as pain managed with oxycodone provided by his orthopedist.  Over the past 5-7 days, he reports developing new erythema, swelling and pain to the right distal calf and proximal ankle.  He reports increasing associated pain with this skin changes.  Denies any additional falls, trauma or injuries.  He reports he is still able to ambulate on this leg, he just has associated pain with it.  He denies systemic symptoms or fevers.  He reports having a PCP visit with Dr. Ouida Sills on Monday, but due to the pain he is feeling and the continued erythema, he wanted evaluation to get checked out.  He reports this was a concern for a blood clot.  Past Medical History:  Diagnosis Date   Arthritis    Cancer [facility])    SKIN CANCER   Diabetes mellitus without complication ([place])    DVT (deep venous thrombosis) ([place]) 07/2017   nonocclusive DVT left internal jugular    GERD (gastroesophageal reflux disease)    H/O   High cholesterol    Hordeolum internum right lower eyelid 12/09/2019   Hypertension    Lumbar radiculitis    Stroke ([place])     07/2017   Wears dentures    full upper and lower    Patient Active Problem List   Diagnosis Date Noted   Peripheral retinal hole, right 12/10/2019   Lower limb ulcer, calf, right, limited to breakdown of skin ([place]) 09/04/2019   Severe nonproliferative diabetic retinopathy of right eye, with macular edema, associated with type 2 diabetes mellitus ([place]) 05/26/2019   Severe nonproliferative diabetic retinopathy of left eye, with macular edema, associated with type 2 diabetes mellitus ([place]) 05/26/2019   Hematuria, gross    Pulmonary embolism ([place]) 02/26/2018   Dizziness 02/26/2018   CVA (cerebral vascular accident) ([place]) 07/15/2017    Past Surgical History:  Procedure Laterality Date   AMPUTATION TOE Left 01/15/2020   Procedure: AMPUTATION TOE MPJ T1;  Surgeon: Samara Deist, DPM;  Location: [facility] ORS;  Service: Podiatry;  Laterality: Left;   BUNIONECTOMY     CATARACT EXTRACTION W/PHACO Left 12/11/2017   Procedure: CATARACT EXTRACTION PHACO AND INTRAOCULAR LENS PLACEMENT ([place])  LEFT DIABETIC;  Surgeon: Leandrew Koyanagi, MD;  Location: [place];  Service: Ophthalmology;  Laterality: Left;  DIABETIC ([facility] picking pt up at 0600, needs 0630 arrival time)   CATARACT EXTRACTION W/PHACO Right 01/15/2018   Procedure: CATARACT EXTRACTION PHACO AND INTRAOCULAR LENS PLACEMENT ([place])  RIGHT DIABETIC  leave so arrival will be around 7:45 ds;  Surgeon: Leandrew Koyanagi, MD;  Location: [place];  Service: Ophthalmology;  Laterality: Right;  Diabetic -  oral meds   CHOLECYSTECTOMY     COLONOSCOPY     COLONOSCOPY WITH PROPOFOL N/A 12/17/2014   Procedure: COLONOSCOPY WITH PROPOFOL;  Surgeon: Lollie Sails, MD;  Location: [facility];  Service: Endoscopy;  Laterality: N/A;   correct hammertoe     FASCIECTOMY Right 10/29/2019   Procedure: PLANTAR FIBROMA RESECTION RIGHT;  Surgeon: Caroline More, DPM;  Location: [place];  Service: Podiatry;  Laterality: Right;   Diabetic - oral meds   JOINT REPLACEMENT     KNEE ARTHROSCOPY Right    LUMBAR LAMINECTOMY/ DECOMPRESSION WITH MET-RX N/A 02/18/2019   Procedure: L4-5 DECOMPRESSION;  Surgeon: Meade Maw, MD;  Location: [facility] ORS;  Service: Neurosurgery;  Laterality: N/A;   TOTAL HIP ARTHROPLASTY     TOTAL KNEE ARTHROPLASTY Right     Prior to Admission medications   Medication Sig Start Date End Date Taking? Authorizing Provider  cephALEXin (KEFLEX) 500 MG capsule Take 1 capsule (500 mg total) by mouth 4 (four) times daily for 7 days. 07/16/20 07/23/20 Yes Vladimir Crofts, MD  methocarbamol (ROBAXIN) 500 MG tablet Take 1 tablet (500 mg total) by mouth every 8 (eight) hours as needed for muscle spasms. 07/16/20  Yes Vladimir Crofts, MD  aspirin EC 81 MG tablet Take 81 mg by mouth daily. Swallow whole.    [provider]  carvedilol (COREG) 12.5 MG tablet Take 12.5 mg by mouth 2 (two) times daily.  12/04/17   [provider]  finasteride (PROSCAR) 5 MG tablet Take 5 mg by mouth every morning.     [provider]  gabapentin (NEURONTIN) 600 MG tablet Take 600 mg by mouth 2 (two) times daily.    [provider]  glimepiride (AMARYL) 2 MG tablet Take 2 mg by mouth daily with breakfast.    [provider]  lisinopril (PRINIVIL,ZESTRIL) 2.5 MG tablet Take 1 tablet (2.5 mg total) by mouth daily. Patient taking differently: Take 2.5 mg by mouth every morning. 02/28/18   Henreitta Leber, MD  metFORMIN (GLUCOPHAGE) 500 MG tablet Take 500 mg by mouth 2 (two) times daily.    [provider]  torsemide (DEMADEX) 20 MG tablet Take 20 mg by mouth daily as needed (swelling).    [provider]  traZODone (DESYREL) 100 MG tablet Take 1 tablet (100 mg total) by mouth at bedtime as needed for sleep. 07/17/17 10/22/19  Saundra Shelling, MD    Allergies Oxycodone, Clonidine derivatives, Fioricet [butalbital-apap-caffeine], Hydrocodone-acetaminophen, Mobic [meloxicam],  Norvasc [amlodipine besylate], Statins, and Tramadol  Family History  Problem Relation Age of Onset   Clotting disorder Father    Hypertension Father     Social History Social History   Tobacco Use   Smoking status: Never   Smokeless tobacco: Former    Types: Chew    Quit date: 02/08/1978  Vaping Use   Vaping Use: Never used  Substance Use Topics   Alcohol use: Yes    Comment: RARE   Drug use: No    Review of Systems  Constitutional: No fever/chills Eyes: No visual changes. ENT: No sore throat. Cardiovascular: Denies chest pain. Respiratory: Denies shortness of breath. Gastrointestinal: No abdominal pain.  No nausea, no vomiting.  No diarrhea.  No constipation. Genitourinary: Negative for dysuria. Musculoskeletal: Negative for back pain. Positive atraumatic distal right leg pain and swelling with erythema. Skin: Negative for rash. Neurological: Negative for headaches, focal weakness or numbness.  ____________________________________________   PHYSICAL EXAM:  VITAL SIGNS: Vitals:   07/16/20 0204 07/16/20  0730  BP: (!) 165/89 (!) 178/95  Pulse: 77 67  Resp: 16 16  Temp: 98.4 F (36.9 C)   SpO2: 98% 97%     Constitutional: Alert and oriented. Well appearing and in no acute distress. Eyes: Conjunctivae are normal. PERRL. EOMI. Head: Atraumatic. Nose: No congestion/rhinnorhea. Mouth/Throat: Mucous membranes are moist.  Oropharynx non-erythematous. Neck: No stridor. No cervical spine tenderness to palpation. Cardiovascular: Normal rate, regular rhythm. Grossly normal heart sounds.  Good peripheral circulation. Respiratory: Normal respiratory effort.  No retractions. Lungs CTAB. Gastrointestinal: Soft , nondistended, nontender to palpation. No CVA tenderness. Musculoskeletal: Some flat bruising to the right knee and right hip, the patient reports has been improving over the past couple weeks. No laceration or evidence of more acute trauma. To his distal RLE,  his distal calf and proximal ankle, he has flat and confluent erythema, shiny skin, tenderness to palpation and mild soft tissue swelling. No impaired range of motion with active and passive ROM to the right ankle or knee and patient reports his foot feels fine. No evidence of neurovascular deficits. Neurologic:  Normal speech and language. No gross focal neurologic deficits are appreciated. No gait instability noted. Skin:  Skin is warm, dry and intact. No rash noted. Psychiatric: Mood and affect are normal. Speech and behavior are normal.  ____________________________________________   LABS (all labs ordered are listed, but only abnormal results are displayed)  Labs Reviewed  CBC WITH DIFFERENTIAL/PLATELET - Abnormal; Notable for the following components:      Result Value   RBC 3.85 (*)    Hemoglobin 12.4 (*)    HCT 37.6 (*)    All other components within normal limits  COMPREHENSIVE METABOLIC PANEL   ____________________________________________  12 Lead EKG   ____________________________________________  RADIOLOGY  ED MD interpretation:    Official radiology report(s): US Venous Img Lower Unilateral Right  Result Date: 07/16/2020 CLINICAL DATA:  Right leg pain and redness since fall 3 days ago EXAM: RIGHT LOWER EXTREMITY VENOUS DOPPLER ULTRASOUND TECHNIQUE: Gray-scale sonography with compression, as well as color and duplex ultrasound, were performed to evaluate the deep venous system(s) from the level of the common femoral vein through the popliteal and proximal calf veins. COMPARISON:  None. FINDINGS: VENOUS Normal compressibility of the common femoral, superficial femoral, and popliteal veins, as well as the visualized calf veins. Visualized portions of profunda femoral vein and great saphenous vein unremarkable. No filling defects to suggest DVT on grayscale or color Doppler imaging. Doppler waveforms show normal direction of venous flow, normal respiratory plasticity and  response to augmentation. Limited views of the contralateral common femoral vein are unremarkable. OTHER Mild subcutaneous edema along the calf. Limitations: none IMPRESSION: Negative. Electronically Signed   By: Julian Hy M.D.   On: 07/16/2020 03:21    ____________________________________________   PROCEDURES and INTERVENTIONS  Procedure(s) performed (including Critical Care):  Procedures  Medications  cephALEXin (KEFLEX) capsule 500 mg (has no administration in time range)  acetaminophen (TYLENOL) tablet 1,000 mg (has no administration in time range)  gabapentin (NEURONTIN) capsule 600 mg (has no administration in time range)  methocarbamol (ROBAXIN) tablet 500 mg (has no administration in time range)    ____________________________________________   MDM / ED COURSE   Pleasant 70 year old male presents to the ED with atraumatic erythema, swelling and pain to his distal right leg, without evidence of acute trauma or DVT, with evidence of cellulitis, and amenable to outpatient management.  No stigmata of sepsis or systemic illness.  Ultrasound without  evidence of DVT.  He otherwise looks well and is ambulatory at his baseline.  Skin changes are most consistent with cellulitis.  Considering his already-scheduled PCP visit for 2 days from now, I think is quite reasonable for a trial of outpatient management with Keflex.  We discussed nonnarcotic multimodal analgesia, Keflex regimen and following up with PCP.  We discussed return precautions for the ED.      ____________________________________________   FINAL CLINICAL IMPRESSION(S) / ED DIAGNOSES  Final diagnoses:  Cellulitis of right lower extremity     ED Discharge Orders          Ordered    cephALEXin (KEFLEX) 500 MG capsule  4 times daily        07/16/20 0812    methocarbamol (ROBAXIN) 500 MG tablet  Every 8 hours PRN        07/16/20 6153             Christiano Blandon   Note:  This document was prepared  using Dragon voice recognition software and may include unintentional dictation errors.    Vladimir Crofts, MD 07/16/20 [phone]

## 2020-07-16 NOTE — ED Triage Notes (Signed)
Pt with right lower leg swelling and redness. Pt with recent fall injuring right leg from hip down last week. Pt with ecchymosis noted to right lateral hip and knee. Pt with history of DM.

## 2020-07-18 DIAGNOSIS — L03115 Cellulitis of right lower limb: Secondary | ICD-10-CM | POA: Diagnosis not present

## 2020-07-21 ENCOUNTER — Other Ambulatory Visit (INDEPENDENT_AMBULATORY_CARE_PROVIDER_SITE_OTHER): Payer: Self-pay | Admitting: Vascular Surgery

## 2020-07-21 DIAGNOSIS — L03115 Cellulitis of right lower limb: Secondary | ICD-10-CM | POA: Diagnosis not present

## 2020-07-21 DIAGNOSIS — M79604 Pain in right leg: Secondary | ICD-10-CM

## 2020-07-22 ENCOUNTER — Other Ambulatory Visit: Payer: Self-pay

## 2020-07-22 ENCOUNTER — Ambulatory Visit (INDEPENDENT_AMBULATORY_CARE_PROVIDER_SITE_OTHER): Payer: PPO

## 2020-07-22 DIAGNOSIS — M79604 Pain in right leg: Secondary | ICD-10-CM

## 2020-08-29 DIAGNOSIS — E11319 Type 2 diabetes mellitus with unspecified diabetic retinopathy without macular edema: Secondary | ICD-10-CM | POA: Diagnosis not present

## 2020-09-15 DIAGNOSIS — I10 Essential (primary) hypertension: Secondary | ICD-10-CM | POA: Diagnosis not present

## 2020-09-15 DIAGNOSIS — I779 Disorder of arteries and arterioles, unspecified: Secondary | ICD-10-CM | POA: Diagnosis not present

## 2020-09-15 DIAGNOSIS — I129 Hypertensive chronic kidney disease with stage 1 through stage 4 chronic kidney disease, or unspecified chronic kidney disease: Secondary | ICD-10-CM | POA: Diagnosis not present

## 2020-09-15 DIAGNOSIS — E1122 Type 2 diabetes mellitus with diabetic chronic kidney disease: Secondary | ICD-10-CM | POA: Diagnosis not present

## 2020-09-15 DIAGNOSIS — M791 Myalgia, unspecified site: Secondary | ICD-10-CM | POA: Diagnosis not present

## 2020-09-15 DIAGNOSIS — T466X5A Adverse effect of antihyperlipidemic and antiarteriosclerotic drugs, initial encounter: Secondary | ICD-10-CM | POA: Diagnosis not present

## 2020-09-15 DIAGNOSIS — N183 Chronic kidney disease, stage 3 unspecified: Secondary | ICD-10-CM | POA: Diagnosis not present

## 2020-09-15 DIAGNOSIS — Z86711 Personal history of pulmonary embolism: Secondary | ICD-10-CM | POA: Diagnosis not present

## 2020-09-15 DIAGNOSIS — E7800 Pure hypercholesterolemia, unspecified: Secondary | ICD-10-CM | POA: Diagnosis not present

## 2020-09-26 ENCOUNTER — Other Ambulatory Visit: Payer: Self-pay

## 2020-09-26 ENCOUNTER — Encounter (INDEPENDENT_AMBULATORY_CARE_PROVIDER_SITE_OTHER): Payer: PPO | Admitting: Ophthalmology

## 2020-09-26 ENCOUNTER — Ambulatory Visit (INDEPENDENT_AMBULATORY_CARE_PROVIDER_SITE_OTHER): Payer: PPO | Admitting: Ophthalmology

## 2020-09-26 ENCOUNTER — Encounter (INDEPENDENT_AMBULATORY_CARE_PROVIDER_SITE_OTHER): Payer: Self-pay | Admitting: Ophthalmology

## 2020-09-26 DIAGNOSIS — E113412 Type 2 diabetes mellitus with severe nonproliferative diabetic retinopathy with macular edema, left eye: Secondary | ICD-10-CM

## 2020-09-26 DIAGNOSIS — H26492 Other secondary cataract, left eye: Secondary | ICD-10-CM | POA: Diagnosis not present

## 2020-09-26 DIAGNOSIS — H26491 Other secondary cataract, right eye: Secondary | ICD-10-CM | POA: Insufficient documentation

## 2020-09-26 DIAGNOSIS — E113411 Type 2 diabetes mellitus with severe nonproliferative diabetic retinopathy with macular edema, right eye: Secondary | ICD-10-CM | POA: Diagnosis not present

## 2020-09-26 HISTORY — DX: Other secondary cataract, right eye: H26.491

## 2020-09-26 NOTE — Assessment & Plan Note (Addendum)
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  And post focal laser March 2022 will observe

## 2020-09-26 NOTE — Assessment & Plan Note (Signed)
Central PC opacity in conjunction with patient complaints of new onset gradual blurred vision.  We will offer YAG capsulotomy OD

## 2020-09-26 NOTE — Assessment & Plan Note (Signed)
OS also no sign of CSME recurrence will observe

## 2020-09-26 NOTE — Progress Notes (Signed)
09/26/2020     CHIEF COMPLAINT Patient presents for  Chief Complaint  Patient presents with   Retina Follow Up      HISTORY OF PRESENT ILLNESS: Michael Humphrey is a 70 y.o. male who presents to the clinic today for:   HPI     Retina Follow Up   Patient presents with  Diabetic Retinopathy.  In both eyes.  This started 12 weeks ago.  Duration of 12 weeks.        Comments   12 week f/u OU with OCT and FP  Pt states he was evaluated by Dr. Ellin Mayhew for glasses, he desired to get a new pair of glasses that allowed him to read small print. Pt states he was told by Dr. Ellin Mayhew that he needed to have the IOL's removed and have a laser performed in order for him to be able to see better.  Pt does c/o significant glare symptoms in both eyes.  Pt denies any visual changes since previous visit. Pt denies any new flashes or floaters. Pt denies any eye pain.   Type 2 Diabetic. Diagnosed for ~ 10 years. A1c: 6.0  Eye Meds: None      Last edited by Reather Littler, COA on 09/26/2020  9:03 AM.      Referring physician: Anell Barr, [place],  [place] [address]  HISTORICAL INFORMATION:   Selected notes from the MEDICAL RECORD NUMBER    Lab Results  Component Value Date   HGBA1C 7.1 (H) 07/15/2017     CURRENT MEDICATIONS: No current outpatient medications on file. (Ophthalmic Drugs)   No current facility-administered medications for this visit. (Ophthalmic Drugs)   Current Outpatient Medications (Other)  Medication Sig   aspirin EC 81 MG tablet Take 81 mg by mouth daily. Swallow whole.   carvedilol (COREG) 12.5 MG tablet Take 12.5 mg by mouth 2 (two) times daily.    finasteride (PROSCAR) 5 MG tablet Take 5 mg by mouth every morning.    gabapentin (NEURONTIN) 600 MG tablet Take 600 mg by mouth 2 (two) times daily.   glimepiride (AMARYL) 2 MG tablet Take 2 mg by mouth daily with breakfast.   lisinopril (PRINIVIL,ZESTRIL) 2.5 MG tablet Take 1 tablet  (2.5 mg total) by mouth daily. (Patient taking differently: Take 2.5 mg by mouth every morning.)   metFORMIN (GLUCOPHAGE) 500 MG tablet Take 500 mg by mouth 2 (two) times daily.   methocarbamol (ROBAXIN) 500 MG tablet Take 1 tablet (500 mg total) by mouth every 8 (eight) hours as needed for muscle spasms.   torsemide (DEMADEX) 20 MG tablet Take 20 mg by mouth daily as needed (swelling).   traZODone (DESYREL) 100 MG tablet Take 1 tablet (100 mg total) by mouth at bedtime as needed for sleep.   No current facility-administered medications for this visit. (Other)      REVIEW OF SYSTEMS:    ALLERGIES Allergies  Allergen Reactions   Oxycodone     hallucinations   Clonidine Derivatives     Hallucinations    Fioricet [Butalbital-Apap-Caffeine]     hallucination   Hydrocodone-Acetaminophen     hallucinations   Mobic [Meloxicam]     hallucinations   Norvasc [Amlodipine Besylate]     hallucinations   Statins     Muscle cramps   Tramadol Itching    PAST MEDICAL HISTORY Past Medical History:  Diagnosis Date   Arthritis    Cancer ([place])    SKIN  CANCER   Diabetes mellitus without complication ([place])    DVT (deep venous thrombosis) ([place]) 07/2017   nonocclusive DVT left internal jugular    GERD (gastroesophageal reflux disease)    H/O   High cholesterol    Hordeolum internum right lower eyelid 12/09/2019   Hypertension    Lumbar radiculitis    Stroke ([place])    07/2017   Wears dentures    full upper and lower   Past Surgical History:  Procedure Laterality Date   AMPUTATION TOE Left 01/15/2020   Procedure: AMPUTATION TOE MPJ T1;  Surgeon: Samara Deist, DPM;  Location: [facility];  Service: Podiatry;  Laterality: Left;   BUNIONECTOMY     CATARACT EXTRACTION W/PHACO Left 12/11/2017   Procedure: CATARACT EXTRACTION PHACO AND INTRAOCULAR LENS PLACEMENT ([place])  LEFT DIABETIC;  Surgeon: Leandrew Koyanagi, MD;  Location: [facility];  Service: Ophthalmology;  Laterality:  Left;  DIABETIC ([facility] picking pt up at 0600, needs 0630 arrival time)   CATARACT EXTRACTION W/PHACO Right 01/15/2018   Procedure: CATARACT EXTRACTION PHACO AND INTRAOCULAR LENS PLACEMENT ([place])  RIGHT DIABETIC  leave so arrival will be around 7:45 ds;  Surgeon: Leandrew Koyanagi, MD;  Location: [facility];  Service: Ophthalmology;  Laterality: Right;  Diabetic - oral meds   CHOLECYSTECTOMY     COLONOSCOPY     COLONOSCOPY WITH PROPOFOL N/A 12/17/2014   Procedure: COLONOSCOPY WITH PROPOFOL;  Surgeon: Lollie Sails, MD;  Location: [facility];  Service: Endoscopy;  Laterality: N/A;   correct hammertoe     FASCIECTOMY Right 10/29/2019   Procedure: PLANTAR FIBROMA RESECTION RIGHT;  Surgeon: Caroline More, DPM;  Location: [facility];  Service: Podiatry;  Laterality: Right;  Diabetic - oral meds   JOINT REPLACEMENT     KNEE ARTHROSCOPY Right    LUMBAR LAMINECTOMY/ DECOMPRESSION WITH MET-RX N/A 02/18/2019   Procedure: L4-5 DECOMPRESSION;  Surgeon: Meade Maw, MD;  Location: [facility];  Service: Neurosurgery;  Laterality: N/A;   TOTAL HIP ARTHROPLASTY     TOTAL KNEE ARTHROPLASTY Right     FAMILY HISTORY Family History  Problem Relation Age of Onset   Clotting disorder Father    Hypertension Father     SOCIAL HISTORY Social History   Tobacco Use   Smoking status: Never   Smokeless tobacco: Former    Types: Chew    Quit date: 02/08/1978  Vaping Use   Vaping Use: Never used  Substance Use Topics   Alcohol use: Yes    Comment: RARE   Drug use: No         OPHTHALMIC EXAM:  Base Eye Exam     Visual Acuity (ETDRS)       Right Left   Dist [place] 20/60 -1 20/40 -2   Dist ph [place] NI NI         Tonometry (Tonopen, 9:09 AM)       Right Left   Pressure 10 12         Pupils       Pupils Dark Light Shape React APD   Right PERRL 4 3 Round Brisk None   Left PERRL 4 3 Round Brisk None         Visual Fields (Counting fingers)       Left Right     Full Full         Extraocular Movement       Right Left    Full, Ortho Full, Ortho  Neuro/Psych     Oriented x3: Yes   Mood/Affect: Normal         Dilation     Both eyes: 1.0% Mydriacyl, 2.5% Phenylephrine @ 9:09 AM           Slit Lamp and Fundus Exam     External Exam       Right Left   External Normal Normal         Slit Lamp Exam       Right Left   Lids/Lashes Normal Normal   Conjunctiva/Sclera White and quiet White and quiet   Cornea Clear, no signs of abrasion today no epithelial defect no Clear   Anterior Chamber Deep and quiet, no cells Deep and quiet   Iris Round and reactive Round and reactive   Lens Posterior chamber intraocular lens, 2+ Posterior capsular opacification Posterior chamber intraocular lens, 1+ Posterior capsular opacification   Anterior Vitreous Normal, no cells Normal         Fundus Exam       Right Left   Posterior Vitreous Normal Normal   Disc Normal Normal   C/D Ratio 0.0 0.0   Macula  Exudates in a circinate ring temporally now with good focal laser., Microaneurysms Microaneurysms, no clinically significant macular edema, no focal laser scars   Vessels NPDR-Severe NPDR-Severe   Periphery Normal, good retinopexy to tear at 1030 location,  Normal            IMAGING AND PROCEDURES  Imaging and Procedures for 09/26/20  OCT, Retina - OU - Both Eyes       Right Eye Quality was borderline. Scan locations included subfoveal. Central Foveal Thickness: 275. Progression has been stable. Findings include no IRF, no SRF.   Left Eye Quality was good. Scan locations included subfoveal. Central Foveal Thickness: 290. Findings include normal foveal contour, no IRF, no SRF, retinal drusen .   Notes No active CSME OD.  Vastly improved as compared to last photographs 12-09-19.  We will continue to observe  OS no active CSME     Color Fundus Photography Optos - OU - Both Eyes       Right Eye Progression has  been stable. Disc findings include normal observations. Macula : microaneurysms.   Left Eye Progression has been stable. Disc findings include normal observations. Macula : microaneurysms.   Notes Cloudy media OD  With detectable severe NPDR OU and PVD OU     Yag Capsulotomy - OD - Right Eye       Time Out Confirmed correct patient, procedure, site, and patient consented.   Anesthesia Topical anesthesia was used. Anesthesia medications included Proparacaine.   Laser Information The type of laser was yag. Total spots was 22. The energy was 1 mj. Total energy was 22 mj.   Post-op The patient tolerated the procedure well. There were no complications. The patient received written and verbal post procedure care education.              ASSESSMENT/PLAN:  Posterior capsular opacification, right Central PC opacity in conjunction with patient complaints of new onset gradual blurred vision.  We will offer YAG capsulotomy OD  Posterior capsular opacification, left Observe OS  Severe nonproliferative diabetic retinopathy of right eye, with macular edema, associated with type 2 diabetes mellitus ([place]) Severe CSME OD has continued to improve as compared to December 2021.  And post focal laser March 2022 will observe  Severe nonproliferative diabetic retinopathy of left eye, with macular edema,  associated with type 2 diabetes mellitus ([place]) OS also no sign of CSME recurrence will observe     ICD-10-CM   1. Severe nonproliferative diabetic retinopathy of right eye, with macular edema, associated with type 2 diabetes mellitus ([facility])  E11.3411 OCT, Retina - OU - Both Eyes    Color Fundus Photography Optos - OU - Both Eyes    2. Posterior capsular opacification, right  H26.491 Yag Capsulotomy - OD - Right Eye    3. Posterior capsular opacification, left  H26.492     4. Severe nonproliferative diabetic retinopathy of left eye, with macular edema, associated with type 2 diabetes  mellitus ([place])  Y24.8250       1.  OD with posterior capsule opacification accounting for some component of cloudy vision in the right eye confirmed by examination and also by documentation of the funduscopic appearance.  2.  YAG capsulotomy reviewed with the patient and offered to the patient.  Performed today without complication the right eye.  3.  Mild posterior capsule of fat opacification left eye will observe for now  4.  Severe NPDR OU stable  5.  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      Return in about 4 months (around 01/26/2021) for DILATE OU, OCT, COLOR FP.  There are no Patient Instructions on file for this visit.   Explained the diagnoses, plan, and follow up with the patient and they expressed understanding.  Patient expressed understanding of the importance of proper follow up care.   Clent Demark Belina Mandile M.D. Diseases & Surgery of the Retina and Vitreous Retina [facility] Diabetic [place] 09/26/20     Abbreviations: M myopia (nearsighted); A astigmatism; H hyperopia (farsighted); P presbyopia; Mrx spectacle prescription;  CTL contact lenses; OD right eye; OS left eye; OU both eyes  XT exotropia; ET esotropia; PEK punctate epithelial keratitis; PEE punctate epithelial erosions; DES dry eye syndrome; MGD meibomian gland dysfunction; ATs artificial tears; PFAT's preservative free artificial tears; [place] nuclear sclerotic cataract; PSC posterior subcapsular cataract; ERM epi-retinal membrane; PVD posterior vitreous detachment; RD retinal detachment; DM diabetes mellitus; DR diabetic retinopathy; NPDR non-proliferative diabetic retinopathy; PDR proliferative diabetic retinopathy; CSME clinically significant macular edema; DME diabetic macular edema; dbh dot blot hemorrhages; CWS cotton wool  spot; POAG primary open angle glaucoma; C/D cup-to-disc ratio; HVF humphrey visual field; GVF goldmann visual field; OCT optical coherence tomography; IOP intraocular pressure; BRVO Branch retinal vein occlusion; CRVO central retinal vein occlusion; CRAO central retinal artery occlusion; BRAO branch retinal artery occlusion; RT retinal tear; SB scleral buckle; PPV pars plana vitrectomy; VH Vitreous hemorrhage; PRP panretinal laser photocoagulation; IVK intravitreal kenalog; VMT vitreomacular traction; MH Macular hole;  NVD neovascularization of the disc; NVE neovascularization elsewhere; AREDS age related eye disease study; ARMD age related macular degeneration; POAG primary open angle glaucoma; EBMD epithelial/anterior basement membrane dystrophy; ACIOL anterior chamber intraocular lens; IOL intraocular lens; PCIOL posterior chamber intraocular lens; Phaco/IOL phacoemulsification with intraocular lens placement; [place] photorefractive keratectomy; LASIK laser assisted in situ keratomileusis; HTN hypertension; DM diabetes mellitus; COPD chronic obstructive pulmonary disease

## 2020-09-26 NOTE — Assessment & Plan Note (Signed)
Observe OS °

## 2020-10-10 DIAGNOSIS — Z6835 Body mass index (BMI) 35.0-35.9, adult: Secondary | ICD-10-CM | POA: Diagnosis not present

## 2020-10-10 DIAGNOSIS — E1122 Type 2 diabetes mellitus with diabetic chronic kidney disease: Secondary | ICD-10-CM | POA: Diagnosis not present

## 2020-10-10 DIAGNOSIS — I129 Hypertensive chronic kidney disease with stage 1 through stage 4 chronic kidney disease, or unspecified chronic kidney disease: Secondary | ICD-10-CM | POA: Diagnosis not present

## 2020-10-10 DIAGNOSIS — E7800 Pure hypercholesterolemia, unspecified: Secondary | ICD-10-CM | POA: Diagnosis not present

## 2020-10-10 DIAGNOSIS — N183 Chronic kidney disease, stage 3 unspecified: Secondary | ICD-10-CM | POA: Diagnosis not present

## 2020-10-17 DIAGNOSIS — E7800 Pure hypercholesterolemia, unspecified: Secondary | ICD-10-CM | POA: Diagnosis not present

## 2020-10-17 DIAGNOSIS — I10 Essential (primary) hypertension: Secondary | ICD-10-CM | POA: Diagnosis not present

## 2020-10-17 DIAGNOSIS — E1122 Type 2 diabetes mellitus with diabetic chronic kidney disease: Secondary | ICD-10-CM | POA: Diagnosis not present

## 2020-10-17 DIAGNOSIS — I129 Hypertensive chronic kidney disease with stage 1 through stage 4 chronic kidney disease, or unspecified chronic kidney disease: Secondary | ICD-10-CM | POA: Diagnosis not present

## 2020-10-17 DIAGNOSIS — M791 Myalgia, unspecified site: Secondary | ICD-10-CM | POA: Diagnosis not present

## 2020-10-17 DIAGNOSIS — T466X5A Adverse effect of antihyperlipidemic and antiarteriosclerotic drugs, initial encounter: Secondary | ICD-10-CM | POA: Diagnosis not present

## 2020-10-17 DIAGNOSIS — E113299 Type 2 diabetes mellitus with mild nonproliferative diabetic retinopathy without macular edema, unspecified eye: Secondary | ICD-10-CM | POA: Diagnosis not present

## 2020-10-17 DIAGNOSIS — I779 Disorder of arteries and arterioles, unspecified: Secondary | ICD-10-CM | POA: Diagnosis not present

## 2020-10-17 DIAGNOSIS — Z0000 Encounter for general adult medical examination without abnormal findings: Secondary | ICD-10-CM | POA: Diagnosis not present

## 2020-10-17 DIAGNOSIS — N183 Chronic kidney disease, stage 3 unspecified: Secondary | ICD-10-CM | POA: Diagnosis not present

## 2020-10-19 ENCOUNTER — Encounter (INDEPENDENT_AMBULATORY_CARE_PROVIDER_SITE_OTHER): Payer: PPO | Admitting: Ophthalmology

## 2020-10-28 ENCOUNTER — Other Ambulatory Visit: Payer: Self-pay | Admitting: Orthopedic Surgery

## 2020-10-28 DIAGNOSIS — Z96652 Presence of left artificial knee joint: Secondary | ICD-10-CM | POA: Diagnosis not present

## 2020-10-28 DIAGNOSIS — M25562 Pain in left knee: Secondary | ICD-10-CM | POA: Diagnosis not present

## 2020-11-02 ENCOUNTER — Encounter
Admission: RE | Admit: 2020-11-02 | Discharge: 2020-11-02 | Disposition: A | Discharge status: Home / Self Care | Payer: PPO | Source: Ambulatory Visit | Attending: Orthopedic Surgery | Admitting: Orthopedic Surgery

## 2020-11-02 ENCOUNTER — Other Ambulatory Visit: Payer: Self-pay

## 2020-11-02 DIAGNOSIS — Z96652 Presence of left artificial knee joint: Secondary | ICD-10-CM | POA: Insufficient documentation

## 2020-11-02 DIAGNOSIS — M25562 Pain in left knee: Secondary | ICD-10-CM | POA: Insufficient documentation

## 2020-11-02 DIAGNOSIS — M25461 Effusion, right knee: Secondary | ICD-10-CM | POA: Diagnosis not present

## 2020-11-02 DIAGNOSIS — M25462 Effusion, left knee: Secondary | ICD-10-CM | POA: Diagnosis not present

## 2020-11-02 MED ORDER — TECHNETIUM TC 99M MEDRONATE IV KIT
20.0000 | PACK | Freq: Once | INTRAVENOUS | Status: AC | PRN
  Administered 2020-11-02: 21.7 via INTRAVENOUS

## 2020-11-10 DIAGNOSIS — M5416 Radiculopathy, lumbar region: Secondary | ICD-10-CM | POA: Diagnosis not present

## 2020-11-10 DIAGNOSIS — Z96652 Presence of left artificial knee joint: Secondary | ICD-10-CM | POA: Diagnosis not present

## 2020-12-06 DIAGNOSIS — R350 Frequency of micturition: Secondary | ICD-10-CM | POA: Diagnosis not present

## 2020-12-06 DIAGNOSIS — R8290 Unspecified abnormal findings in urine: Secondary | ICD-10-CM | POA: Diagnosis not present

## 2020-12-12 DIAGNOSIS — M5136 Other intervertebral disc degeneration, lumbar region: Secondary | ICD-10-CM | POA: Diagnosis not present

## 2020-12-12 DIAGNOSIS — M48062 Spinal stenosis, lumbar region with neurogenic claudication: Secondary | ICD-10-CM | POA: Diagnosis not present

## 2020-12-12 DIAGNOSIS — M5416 Radiculopathy, lumbar region: Secondary | ICD-10-CM | POA: Diagnosis not present

## 2020-12-15 DIAGNOSIS — M48062 Spinal stenosis, lumbar region with neurogenic claudication: Secondary | ICD-10-CM | POA: Diagnosis not present

## 2020-12-15 DIAGNOSIS — M5416 Radiculopathy, lumbar region: Secondary | ICD-10-CM | POA: Diagnosis not present

## 2021-01-03 ENCOUNTER — Encounter (INDEPENDENT_AMBULATORY_CARE_PROVIDER_SITE_OTHER): Payer: PPO | Admitting: Ophthalmology

## 2021-01-23 DIAGNOSIS — M5416 Radiculopathy, lumbar region: Secondary | ICD-10-CM | POA: Diagnosis not present

## 2021-01-23 DIAGNOSIS — M5136 Other intervertebral disc degeneration, lumbar region: Secondary | ICD-10-CM | POA: Diagnosis not present

## 2021-01-23 DIAGNOSIS — M48062 Spinal stenosis, lumbar region with neurogenic claudication: Secondary | ICD-10-CM | POA: Diagnosis not present

## 2021-01-25 ENCOUNTER — Encounter (INDEPENDENT_AMBULATORY_CARE_PROVIDER_SITE_OTHER): Payer: Self-pay | Admitting: Ophthalmology

## 2021-01-25 ENCOUNTER — Other Ambulatory Visit: Payer: Self-pay

## 2021-01-25 ENCOUNTER — Ambulatory Visit (INDEPENDENT_AMBULATORY_CARE_PROVIDER_SITE_OTHER): Payer: PPO | Admitting: Ophthalmology

## 2021-01-25 DIAGNOSIS — H26491 Other secondary cataract, right eye: Secondary | ICD-10-CM

## 2021-01-25 DIAGNOSIS — E113411 Type 2 diabetes mellitus with severe nonproliferative diabetic retinopathy with macular edema, right eye: Secondary | ICD-10-CM

## 2021-01-25 DIAGNOSIS — H26492 Other secondary cataract, left eye: Secondary | ICD-10-CM

## 2021-01-25 DIAGNOSIS — E113412 Type 2 diabetes mellitus with severe nonproliferative diabetic retinopathy with macular edema, left eye: Secondary | ICD-10-CM

## 2021-01-25 NOTE — Assessment & Plan Note (Signed)
Condition improved post YAG capsulotomy OD

## 2021-01-25 NOTE — Assessment & Plan Note (Signed)
Minor as of this date

## 2021-01-25 NOTE — Assessment & Plan Note (Signed)
No evidence of clinically significant macular edema recurrence, may need peripheral anterior PRP to protect progression to PDR

## 2021-01-25 NOTE — Assessment & Plan Note (Signed)
Continues with severe NPDR may need peripheral anterior PRP soon

## 2021-01-25 NOTE — Progress Notes (Signed)
01/25/2021     CHIEF COMPLAINT Patient presents for  Chief Complaint  Patient presents with   Retina Follow Up      HISTORY OF PRESENT ILLNESS: Michael Humphrey is a 71 y.o. male who presents to the clinic today for:   HPI     Retina Follow Up           Diagnosis: Diabetic Retinopathy   Laterality: both eyes   Onset: 3 months ago   Severity: mild   Duration: 3 months   Course: stable         Comments   3 month fu OU and OCT and FP  Pt states VA OU stable since last visit. Pt denies FOL, floaters, or ocular pain OU.  Pt states, "I still have trouble seeing at a distance but that has been this way for a long time."        Last edited by Kendra Opitz, COA on 01/25/2021  8:08 AM.      Referring physician: Kirk Ruths, MD [facility] [place],  [place] [address]  HISTORICAL INFORMATION:   Selected notes from the MEDICAL RECORD NUMBER    Lab Results  Component Value Date   HGBA1C 7.1 (H) 07/15/2017     CURRENT MEDICATIONS: No current outpatient medications on file. (Ophthalmic Drugs)   No current facility-administered medications for this visit. (Ophthalmic Drugs)   Current Outpatient Medications (Other)  Medication Sig   aspirin EC 81 MG tablet Take 81 mg by mouth daily. Swallow whole.   carvedilol (COREG) 12.5 MG tablet Take 12.5 mg by mouth 2 (two) times daily.    finasteride (PROSCAR) 5 MG tablet Take 5 mg by mouth every morning.    gabapentin (NEURONTIN) 600 MG tablet Take 600 mg by mouth 2 (two) times daily.   glimepiride (AMARYL) 2 MG tablet Take 2 mg by mouth daily with breakfast.   lisinopril (PRINIVIL,ZESTRIL) 2.5 MG tablet Take 1 tablet (2.5 mg total) by mouth daily. (Patient taking differently: Take 2.5 mg by mouth every morning.)   metFORMIN (GLUCOPHAGE) 500 MG tablet Take 500 mg by mouth 2 (two) times daily.   methocarbamol (ROBAXIN) 500 MG tablet Take 1 tablet (500 mg total) by mouth every  8 (eight) hours as needed for muscle spasms.   torsemide (DEMADEX) 20 MG tablet Take 20 mg by mouth daily as needed (swelling).   traZODone (DESYREL) 100 MG tablet Take 1 tablet (100 mg total) by mouth at bedtime as needed for sleep.   No current facility-administered medications for this visit. (Other)      REVIEW OF SYSTEMS: ROS   Negative for: Constitutional, Gastrointestinal, Neurological, Skin, Genitourinary, Musculoskeletal, HENT, Endocrine, Cardiovascular, Eyes, Respiratory, Psychiatric, Allergic/Imm, Heme/Lymph Last edited by Hurman Horn, MD on 01/25/2021  8:46 AM.       ALLERGIES Allergies  Allergen Reactions   Oxycodone     hallucinations   Clonidine Derivatives     Hallucinations    Fioricet [Butalbital-Apap-Caffeine]     hallucination   Hydrocodone-Acetaminophen     hallucinations   Mobic [Meloxicam]     hallucinations   Norvasc [Amlodipine Besylate]     hallucinations   Statins     Muscle cramps   Tramadol Itching    PAST MEDICAL HISTORY Past Medical History:  Diagnosis Date   Arthritis    Cancer [facility])    SKIN CANCER   Diabetes mellitus without complication ([place])  DVT (deep venous thrombosis) ([place]) 07/2017   nonocclusive DVT left internal jugular    GERD (gastroesophageal reflux disease)    H/O   High cholesterol    Hordeolum internum right lower eyelid 12/09/2019   Hypertension    Lumbar radiculitis    Stroke ([place])    07/2017   Wears dentures    full upper and lower   Past Surgical History:  Procedure Laterality Date   AMPUTATION TOE Left 01/15/2020   Procedure: AMPUTATION TOE MPJ T1;  Surgeon: Samara Deist, DPM;  Location: [facility] ORS;  Service: Podiatry;  Laterality: Left;   BUNIONECTOMY     CATARACT EXTRACTION W/PHACO Left 12/11/2017   Procedure: CATARACT EXTRACTION PHACO AND INTRAOCULAR LENS PLACEMENT ([place])  LEFT DIABETIC;  Surgeon: Leandrew Koyanagi, MD;  Location: [place];  Service: Ophthalmology;  Laterality: Left;   DIABETIC (ACTA picking pt up at 0600, needs 0630 arrival time)   CATARACT EXTRACTION W/PHACO Right 01/15/2018   Procedure: CATARACT EXTRACTION PHACO AND INTRAOCULAR LENS PLACEMENT ([place])  RIGHT DIABETIC  leave so arrival will be around 7:45 ds;  Surgeon: Leandrew Koyanagi, MD;  Location: [place];  Service: Ophthalmology;  Laterality: Right;  Diabetic - oral meds   CHOLECYSTECTOMY     COLONOSCOPY     COLONOSCOPY WITH PROPOFOL N/A 12/17/2014   Procedure: COLONOSCOPY WITH PROPOFOL;  Surgeon: Lollie Sails, MD;  Location: [facility] ENDOSCOPY;  Service: Endoscopy;  Laterality: N/A;   correct hammertoe     FASCIECTOMY Right 10/29/2019   Procedure: PLANTAR FIBROMA RESECTION RIGHT;  Surgeon: Caroline More, DPM;  Location: [place];  Service: Podiatry;  Laterality: Right;  Diabetic - oral meds   JOINT REPLACEMENT     KNEE ARTHROSCOPY Right    LUMBAR LAMINECTOMY/ DECOMPRESSION WITH MET-RX N/A 02/18/2019   Procedure: L4-5 DECOMPRESSION;  Surgeon: Meade Maw, MD;  Location: [facility] ORS;  Service: Neurosurgery;  Laterality: N/A;   TOTAL HIP ARTHROPLASTY     TOTAL KNEE ARTHROPLASTY Right     FAMILY HISTORY Family History  Problem Relation Age of Onset   Clotting disorder Father    Hypertension Father     SOCIAL HISTORY Social History   Tobacco Use   Smoking status: Never   Smokeless tobacco: Former    Types: Chew    Quit date: 02/08/1978  Vaping Use   Vaping Use: Never used  Substance Use Topics   Alcohol use: Yes    Comment: RARE   Drug use: No         OPHTHALMIC EXAM:  Base Eye Exam     Visual Acuity (ETDRS)       Right Left   Dist [place] 20/40 20/30 -1   Dist ph  20/30 +2 NI         Tonometry (Tonopen, 8:13 AM)       Right Left   Pressure 14 15         Pupils       Pupils Dark Light Shape React APD   Right PERRL 4 3 Round Brisk None   Left PERRL 4 3 Round Brisk None         Visual Fields (Counting fingers)       Left Right     Full Full         Extraocular Movement       Right Left    Full, Ortho Full, Ortho         Neuro/Psych     Oriented x3: Yes  Mood/Affect: Normal         Dilation     Both eyes: 1.0% Mydriacyl, 2.5% Phenylephrine @ 8:13 AM           Slit Lamp and Fundus Exam     External Exam       Right Left   External Normal Normal         Slit Lamp Exam       Right Left   Lids/Lashes Normal Normal   Conjunctiva/Sclera White and quiet White and quiet   Cornea Clear, no signs of abrasion today no epithelial defect no Clear   Anterior Chamber Deep and quiet, no cells Deep and quiet   Iris Round and reactive Round and reactive   Lens Posterior chamber intraocular lens, , Open posterior capsule Posterior chamber intraocular lens, 1+ Posterior capsular opacification   Anterior Vitreous Normal, no cells Normal         Fundus Exam       Right Left   Posterior Vitreous Normal Normal   Disc Normal Normal   C/D Ratio 0.0 0.0   Macula  Exudates in a circinate ring temporally now with good focal laser., Microaneurysms Microaneurysms, no clinically significant macular edema, no focal laser scars   Vessels NPDR-Severe NPDR-Severe   Periphery Normal, good retinopexy to tear at 1030 location,  Normal            IMAGING AND PROCEDURES  Imaging and Procedures for 01/25/21  Color Fundus Photography Optos - OU - Both Eyes       Right Eye Progression has been stable. Disc findings include normal observations. Macula : microaneurysms.   Left Eye Progression has been stable. Disc findings include normal observations. Macula : microaneurysms.   Notes   With detectable severe NPDR OU and PVD OU             ASSESSMENT/PLAN:  Severe nonproliferative diabetic retinopathy of left eye, with macular edema, associated with type 2 diabetes mellitus ([place]) Continues with severe NPDR may need peripheral anterior PRP soon  Severe nonproliferative diabetic retinopathy of  right eye, with macular edema, associated with type 2 diabetes mellitus (HCC) No evidence of clinically significant macular edema recurrence, may need peripheral anterior PRP to protect progression to PDR  Posterior capsular opacification, right Condition improved post YAG capsulotomy OD  Posterior capsular opacification, left Minor as of this date     ICD-10-CM   1. Severe nonproliferative diabetic retinopathy of right eye, with macular edema, associated with type 2 diabetes mellitus (HCC)  K48.1856 Color Fundus Photography Optos - OU - Both Eyes    CANCELED: OCT, Retina - OU - Both Eyes    2. Severe nonproliferative diabetic retinopathy of left eye, with macular edema, associated with type 2 diabetes mellitus (HCC)  D14.9702 Color Fundus Photography Optos - OU - Both Eyes    CANCELED: OCT, Retina - OU - Both Eyes    3. Posterior capsular opacification, right  H26.491     4. Posterior capsular opacification, left  H26.492       1.  OD, no active CSME, severe NPDR.  Recent visual acuity improved OD post YAG capsulotomy  2.  OS severe NPDR also may need peripheral anterior PRP to prevent progression in the future.  Consider ERP next visit  3.  Ophthalmic Meds Ordered this visit:  No orders of the defined types were placed in this encounter.      Return in about 3 months (around 04/25/2021) for  DILATE OU, OCT, COLOR FP.  There are no Patient Instructions on file for this visit.   Explained the diagnoses, plan, and follow up with the patient and they expressed understanding.  Patient expressed understanding of the importance of proper follow up care.   Clent Demark  M.D. Diseases & Surgery of the Retina and Vitreous Retina & Diabetic [place] 01/25/21     Abbreviations: M myopia (nearsighted); A astigmatism; H hyperopia (farsighted); P presbyopia; Mrx spectacle prescription;  CTL contact lenses; OD right eye; OS left eye; OU both eyes  XT exotropia; ET esotropia;  PEK punctate epithelial keratitis; PEE punctate epithelial erosions; DES dry eye syndrome; MGD meibomian gland dysfunction; ATs artificial tears; PFAT's preservative free artificial tears; [place] nuclear sclerotic cataract; PSC posterior subcapsular cataract; ERM epi-retinal membrane; PVD posterior vitreous detachment; RD retinal detachment; DM diabetes mellitus; DR diabetic retinopathy; NPDR non-proliferative diabetic retinopathy; PDR proliferative diabetic retinopathy; CSME clinically significant macular edema; DME diabetic macular edema; dbh dot blot hemorrhages; CWS cotton wool spot; POAG primary open angle glaucoma; C/D cup-to-disc ratio; HVF humphrey visual field; GVF goldmann visual field; OCT optical coherence tomography; IOP intraocular pressure; BRVO Branch retinal vein occlusion; CRVO central retinal vein occlusion; CRAO central retinal artery occlusion; BRAO branch retinal artery occlusion; RT retinal tear; SB scleral buckle; PPV pars plana vitrectomy; VH Vitreous hemorrhage; PRP panretinal laser photocoagulation; IVK intravitreal kenalog; VMT vitreomacular traction; MH Macular hole;  NVD neovascularization of the disc; NVE neovascularization elsewhere; AREDS age related eye disease study; ARMD age related macular degeneration; POAG primary open angle glaucoma; EBMD epithelial/anterior basement membrane dystrophy; ACIOL anterior chamber intraocular lens; IOL intraocular lens; PCIOL posterior chamber intraocular lens; Phaco/IOL phacoemulsification with intraocular lens placement; [place] photorefractive keratectomy; LASIK laser assisted in situ keratomileusis; HTN hypertension; DM diabetes mellitus; COPD chronic obstructive pulmonary disease

## 2021-01-26 ENCOUNTER — Encounter (INDEPENDENT_AMBULATORY_CARE_PROVIDER_SITE_OTHER): Payer: PPO | Admitting: Ophthalmology

## 2021-02-13 DIAGNOSIS — R350 Frequency of micturition: Secondary | ICD-10-CM | POA: Diagnosis not present

## 2021-02-13 DIAGNOSIS — E1122 Type 2 diabetes mellitus with diabetic chronic kidney disease: Secondary | ICD-10-CM | POA: Diagnosis not present

## 2021-02-13 DIAGNOSIS — E7800 Pure hypercholesterolemia, unspecified: Secondary | ICD-10-CM | POA: Diagnosis not present

## 2021-02-13 DIAGNOSIS — I129 Hypertensive chronic kidney disease with stage 1 through stage 4 chronic kidney disease, or unspecified chronic kidney disease: Secondary | ICD-10-CM | POA: Diagnosis not present

## 2021-02-13 DIAGNOSIS — I10 Essential (primary) hypertension: Secondary | ICD-10-CM | POA: Diagnosis not present

## 2021-02-13 DIAGNOSIS — N183 Chronic kidney disease, stage 3 unspecified: Secondary | ICD-10-CM | POA: Diagnosis not present

## 2021-02-20 DIAGNOSIS — E1122 Type 2 diabetes mellitus with diabetic chronic kidney disease: Secondary | ICD-10-CM | POA: Diagnosis not present

## 2021-02-20 DIAGNOSIS — N183 Chronic kidney disease, stage 3 unspecified: Secondary | ICD-10-CM | POA: Diagnosis not present

## 2021-02-20 DIAGNOSIS — T466X5A Adverse effect of antihyperlipidemic and antiarteriosclerotic drugs, initial encounter: Secondary | ICD-10-CM | POA: Diagnosis not present

## 2021-02-20 DIAGNOSIS — Z1211 Encounter for screening for malignant neoplasm of colon: Secondary | ICD-10-CM | POA: Diagnosis not present

## 2021-02-20 DIAGNOSIS — I779 Disorder of arteries and arterioles, unspecified: Secondary | ICD-10-CM | POA: Diagnosis not present

## 2021-02-20 DIAGNOSIS — M791 Myalgia, unspecified site: Secondary | ICD-10-CM | POA: Diagnosis not present

## 2021-02-20 DIAGNOSIS — I129 Hypertensive chronic kidney disease with stage 1 through stage 4 chronic kidney disease, or unspecified chronic kidney disease: Secondary | ICD-10-CM | POA: Diagnosis not present

## 2021-02-20 DIAGNOSIS — I10 Essential (primary) hypertension: Secondary | ICD-10-CM | POA: Diagnosis not present

## 2021-03-22 DIAGNOSIS — S90121A Contusion of right lesser toe(s) without damage to nail, initial encounter: Secondary | ICD-10-CM | POA: Diagnosis not present

## 2021-03-22 DIAGNOSIS — M2041 Other hammer toe(s) (acquired), right foot: Secondary | ICD-10-CM | POA: Diagnosis not present

## 2021-03-22 DIAGNOSIS — S9031XA Contusion of right foot, initial encounter: Secondary | ICD-10-CM | POA: Diagnosis not present

## 2021-03-22 DIAGNOSIS — E1142 Type 2 diabetes mellitus with diabetic polyneuropathy: Secondary | ICD-10-CM | POA: Diagnosis not present

## 2021-03-22 DIAGNOSIS — Z89422 Acquired absence of other left toe(s): Secondary | ICD-10-CM | POA: Diagnosis not present

## 2021-03-22 DIAGNOSIS — L97511 Non-pressure chronic ulcer of other part of right foot limited to breakdown of skin: Secondary | ICD-10-CM | POA: Diagnosis not present

## 2021-03-23 DIAGNOSIS — L578 Other skin changes due to chronic exposure to nonionizing radiation: Secondary | ICD-10-CM | POA: Diagnosis not present

## 2021-03-23 DIAGNOSIS — L814 Other melanin hyperpigmentation: Secondary | ICD-10-CM | POA: Diagnosis not present

## 2021-03-23 DIAGNOSIS — Z1283 Encounter for screening for malignant neoplasm of skin: Secondary | ICD-10-CM | POA: Diagnosis not present

## 2021-03-23 DIAGNOSIS — D1801 Hemangioma of skin and subcutaneous tissue: Secondary | ICD-10-CM | POA: Diagnosis not present

## 2021-03-23 DIAGNOSIS — D2260 Melanocytic nevi of unspecified upper limb, including shoulder: Secondary | ICD-10-CM | POA: Diagnosis not present

## 2021-03-23 DIAGNOSIS — D225 Melanocytic nevi of trunk: Secondary | ICD-10-CM | POA: Diagnosis not present

## 2021-03-23 DIAGNOSIS — D2270 Melanocytic nevi of unspecified lower limb, including hip: Secondary | ICD-10-CM | POA: Diagnosis not present

## 2021-03-23 DIAGNOSIS — L570 Actinic keratosis: Secondary | ICD-10-CM | POA: Diagnosis not present

## 2021-03-23 DIAGNOSIS — L821 Other seborrheic keratosis: Secondary | ICD-10-CM | POA: Diagnosis not present

## 2021-03-28 DIAGNOSIS — M5126 Other intervertebral disc displacement, lumbar region: Secondary | ICD-10-CM | POA: Diagnosis not present

## 2021-03-28 DIAGNOSIS — M48062 Spinal stenosis, lumbar region with neurogenic claudication: Secondary | ICD-10-CM | POA: Diagnosis not present

## 2021-03-28 DIAGNOSIS — M5416 Radiculopathy, lumbar region: Secondary | ICD-10-CM | POA: Diagnosis not present

## 2021-04-11 DIAGNOSIS — I10 Essential (primary) hypertension: Secondary | ICD-10-CM | POA: Diagnosis not present

## 2021-04-11 DIAGNOSIS — R1032 Left lower quadrant pain: Secondary | ICD-10-CM | POA: Diagnosis not present

## 2021-04-11 DIAGNOSIS — K625 Hemorrhage of anus and rectum: Secondary | ICD-10-CM | POA: Diagnosis not present

## 2021-04-13 DIAGNOSIS — K625 Hemorrhage of anus and rectum: Secondary | ICD-10-CM | POA: Diagnosis not present

## 2021-04-18 ENCOUNTER — Other Ambulatory Visit: Payer: Self-pay

## 2021-04-18 ENCOUNTER — Telehealth: Payer: Self-pay

## 2021-04-18 DIAGNOSIS — Z1211 Encounter for screening for malignant neoplasm of colon: Secondary | ICD-10-CM

## 2021-04-18 MED ORDER — NA SULFATE-K SULFATE-MG SULF 17.5-3.13-1.6 GM/177ML PO SOLN: 1.0000 | Freq: Once | ORAL | 0 refills | Status: AC

## 2021-04-18 NOTE — Telephone Encounter (Signed)
Colonoscopy triage was completed however patient required an office visit due to GI issues noted on referral and triage call. ? ?Colonoscopy was canceled.  Office visit has been scheduled. Pt agreed to office visit with Dr. Allen Norris May 9th @ 2:45pm. ? ?Thanks, ?Sharyn Lull, CMA ?

## 2021-04-18 NOTE — Telephone Encounter (Signed)
Contacted patient to explain that he will need an office visit based on his referral and triage we completed.  He has agreed to office visit with Dr. Allen Norris for May 9th.  Informed him that his colonoscopy will be scheduled during his office visit with Dr. Allen Norris. ? ?Thanks, ?Sharyn Lull, CMA ?

## 2021-04-18 NOTE — Telephone Encounter (Signed)
Gastroenterology Pre-Procedure Review ? ?Request Date: Monday 05/08/21 ?Requesting Physician: Allen Norris ? ?PATIENT REVIEW QUESTIONS: The patient responded to the following health history questions as indicated:   ? ?1. Are you having any GI issues?  Blood in stool last week went to PCP gave antibiotics ?2. Do you have a personal history of Polyps? yes (10 years ago with Dr. Gwenlyn Perking soon as he eats he has to use the bathroom advised office visit but declined) ?3. Do you have a family history of Colon Cancer or Polyps? no ?4. Diabetes Mellitus? yes (controlled) ?5. Joint replacements in the past 12 months?no ?6. Major health problems in the past 3 months?no ?7. Any artificial heart valves, MVP, or defibrillator?no ?   ?MEDICATIONS & ALLERGIES:    ?Patient reports the following regarding taking any anticoagulation/antiplatelet therapy:   ?Plavix, Coumadin, Eliquis, Xarelto, Lovenox, Pradaxa, Brilinta, or Effient? no ?Aspirin?  Stoped taking aspirin about a week ago ? ?Patient confirms/reports the following medications:  ?Current Outpatient Medications  ?Medication Sig Dispense Refill  ? aspirin EC 81 MG tablet Take 81 mg by mouth daily. Swallow whole.    ? carvedilol (COREG) 12.5 MG tablet Take 12.5 mg by mouth 2 (two) times daily.   11  ? finasteride (PROSCAR) 5 MG tablet Take 5 mg by mouth every morning.     ? gabapentin (NEURONTIN) 600 MG tablet Take 600 mg by mouth 2 (two) times daily.    ? glimepiride (AMARYL) 2 MG tablet Take 2 mg by mouth daily with breakfast.    ? lisinopril (PRINIVIL,ZESTRIL) 2.5 MG tablet Take 1 tablet (2.5 mg total) by mouth daily. (Patient taking differently: Take 2.5 mg by mouth every morning.)    ? metFORMIN (GLUCOPHAGE) 500 MG tablet Take 500 mg by mouth 2 (two) times daily.    ? methocarbamol (ROBAXIN) 500 MG tablet Take 1 tablet (500 mg total) by mouth every 8 (eight) hours as needed for muscle spasms. 20 tablet 0  ? torsemide (DEMADEX) 20 MG tablet Take 20 mg by mouth daily as needed  (swelling).    ? traZODone (DESYREL) 100 MG tablet Take 1 tablet (100 mg total) by mouth at bedtime as needed for sleep. 30 tablet 0  ? ?No current facility-administered medications for this visit.  ? ? ?Patient confirms/reports the following allergies:  ?Allergies  ?Allergen Reactions  ? Oxycodone   ?  hallucinations  ? Clonidine Derivatives   ?  Hallucinations ?  ? Fioricet [Butalbital-Apap-Caffeine]   ?  hallucination  ? Hydrocodone-Acetaminophen   ?  hallucinations  ? Mobic [Meloxicam]   ?  hallucinations  ? Norvasc [Amlodipine Besylate]   ?  hallucinations  ? Statins   ?  Muscle cramps  ? Tramadol Itching  ? ? ?No orders of the defined types were placed in this encounter. ? ? ?AUTHORIZATION INFORMATION ?Primary Insurance: ?1D#: ?Group #: ? ?Secondary Insurance: ?1D#: ?Group #: ? ?SCHEDULE INFORMATION: ?Date: 05/08/21 ?Time: ?Location: [place] ?

## 2021-04-25 ENCOUNTER — Encounter (INDEPENDENT_AMBULATORY_CARE_PROVIDER_SITE_OTHER): Payer: Self-pay | Admitting: Ophthalmology

## 2021-04-25 ENCOUNTER — Ambulatory Visit (INDEPENDENT_AMBULATORY_CARE_PROVIDER_SITE_OTHER): Payer: PPO | Admitting: Ophthalmology

## 2021-04-25 DIAGNOSIS — H26491 Other secondary cataract, right eye: Secondary | ICD-10-CM

## 2021-04-25 DIAGNOSIS — E113411 Type 2 diabetes mellitus with severe nonproliferative diabetic retinopathy with macular edema, right eye: Secondary | ICD-10-CM | POA: Diagnosis not present

## 2021-04-25 DIAGNOSIS — H26492 Other secondary cataract, left eye: Secondary | ICD-10-CM

## 2021-04-25 DIAGNOSIS — E113412 Type 2 diabetes mellitus with severe nonproliferative diabetic retinopathy with macular edema, left eye: Secondary | ICD-10-CM | POA: Diagnosis not present

## 2021-04-25 NOTE — Assessment & Plan Note (Signed)
The nature of severe nonproliferative diabetic retinopathy discussed with the patient as well as the need for more frequent follow up and likely progression to proliferative disease in the near future. The options of continued observation versus panretinal photocoagulation at this time were reviewed as well as the risks, benefits, and alternatives. More recent option includes the use of ocular injectable medications to slow progression of retinal disease. Tight control of glucose, blood pressure, and serum lipid levels were recommended under the direction of general physician or endocrinologist, as well as avoidance of smoking and maintenance of normal body weight. The 2-year risk of progression to proliferative diabetic retinopathy is 60%. ? ?No clinical progression noted OU ?

## 2021-04-25 NOTE — Assessment & Plan Note (Signed)
Resolved, post YAG capsulotomy September 2022 ?

## 2021-04-25 NOTE — Progress Notes (Signed)
? ? ?04/25/2021 ? ?  ? ?CHIEF COMPLAINT ?Patient presents for  ?Chief Complaint  ?Patient presents with  ? Diabetic Retinopathy without Macular Edema  ? ? ? ? ?HISTORY OF PRESENT ILLNESS: ?Michael Humphrey is a 71 y.o. male who presents to the clinic today for:  ? ?HPI   ?No interval change in health or disease processes ? ?may need glasses OU, scheduled to see Dr. Edison Pace in [facility] for refraction ? ?Last edited by Hurman Horn, MD on 04/25/2021  8:10 AM.  ?  ? ? ?Referring physician: ?Kirk Ruths, MD ?[place],  [place] [address] ? ?HISTORICAL INFORMATION:  ? ?Selected notes from the [facility] ?  ? ?Lab Results  ?Component Value Date  ? HGBA1C 7.1 (H) 07/15/2017  ?  ? ?CURRENT MEDICATIONS: ?No current outpatient medications on file. (Ophthalmic Drugs)  ? ?No current facility-administered medications for this visit. (Ophthalmic Drugs)  ? ?Current Outpatient Medications (Other)  ?Medication Sig  ? aspirin EC 81 MG tablet Take 81 mg by mouth daily. Swallow whole.  ? carvedilol (COREG) 12.5 MG tablet Take 12.5 mg by mouth 2 (two) times daily.   ? finasteride (PROSCAR) 5 MG tablet Take 5 mg by mouth every morning.   ? gabapentin (NEURONTIN) 600 MG tablet Take 600 mg by mouth 2 (two) times daily.  ? glimepiride (AMARYL) 2 MG tablet Take 2 mg by mouth daily with breakfast.  ? lisinopril (PRINIVIL,ZESTRIL) 2.5 MG tablet Take 1 tablet (2.5 mg total) by mouth daily. (Patient taking differently: Take 2.5 mg by mouth every morning.)  ? metFORMIN (GLUCOPHAGE) 500 MG tablet Take 500 mg by mouth 2 (two) times daily.  ? methocarbamol (ROBAXIN) 500 MG tablet Take 1 tablet (500 mg total) by mouth every 8 (eight) hours as needed for muscle spasms.  ? torsemide (DEMADEX) 20 MG tablet Take 20 mg by mouth daily as needed (swelling).  ? traZODone (DESYREL) 100 MG tablet Take 1 tablet (100 mg total) by mouth at bedtime as needed for sleep.  ? ?No current  facility-administered medications for this visit. (Other)  ? ? ? ? ?REVIEW OF SYSTEMS: ?ROS   ?Negative for: Constitutional, Gastrointestinal, Neurological, Skin, Genitourinary, Musculoskeletal, HENT, Endocrine, Cardiovascular, Eyes, Respiratory, Psychiatric, Allergic/Imm, Heme/Lymph ?Last edited by Hurman Horn, MD on 04/25/2021  8:09 AM.  ?  ? ? ? ?ALLERGIES ?Allergies  ?Allergen Reactions  ? Oxycodone   ?  hallucinations  ? Clonidine Derivatives   ?  Hallucinations ?  ? Fioricet [Butalbital-Apap-Caffeine]   ?  hallucination  ? Hydrocodone-Acetaminophen   ?  hallucinations  ? Mobic [Meloxicam]   ?  hallucinations  ? Norvasc [Amlodipine Besylate]   ?  hallucinations  ? Statins   ?  Muscle cramps  ? Tramadol Itching  ? ? ?PAST MEDICAL HISTORY ?Past Medical History:  ?Diagnosis Date  ? Arthritis   ? Cancer [facility])   ? SKIN CANCER  ? Diabetes mellitus without complication ([place])   ? DVT (deep venous thrombosis) ([place]) 07/2017  ? nonocclusive DVT left internal jugular   ? GERD (gastroesophageal reflux disease)   ? H/O  ? High cholesterol   ? Hordeolum internum right lower eyelid 12/09/2019  ? Hypertension   ? Lumbar radiculitis   ? Stroke [facility])   ? 07/2017  ? Wears dentures   ? full upper and lower  ? ?Past Surgical History:  ?Procedure Laterality Date  ? AMPUTATION TOE Left  01/15/2020  ? Procedure: AMPUTATION TOE MPJ T1;  Surgeon: Samara Deist, DPM;  Location: [facility] ORS;  Service: Podiatry;  Laterality: Left;  ? BUNIONECTOMY    ? CATARACT EXTRACTION W/PHACO Left 12/11/2017  ? Procedure: CATARACT EXTRACTION PHACO AND INTRAOCULAR LENS PLACEMENT ([place])  LEFT DIABETIC;  Surgeon: Leandrew Koyanagi, MD;  Location: [place];  Service: Ophthalmology;  Laterality: Left;  DIABETIC (ACTA picking pt up at 0600, needs 0630 arrival time)  ? CATARACT EXTRACTION W/PHACO Right 01/15/2018  ? Procedure: CATARACT EXTRACTION PHACO AND INTRAOCULAR LENS PLACEMENT ([place])  RIGHT DIABETIC  leave so arrival will be around 7:45 ds;   Surgeon: Leandrew Koyanagi, MD;  Location: [place];  Service: Ophthalmology;  Laterality: Right;  Diabetic - oral meds  ? CHOLECYSTECTOMY    ? COLONOSCOPY    ? COLONOSCOPY WITH PROPOFOL N/A 12/17/2014  ? Procedure: COLONOSCOPY WITH PROPOFOL;  Surgeon: Lollie Sails, MD;  Location: [facility] ENDOSCOPY;  Service: Endoscopy;  Laterality: N/A;  ? correct hammertoe    ? FASCIECTOMY Right 10/29/2019  ? Procedure: PLANTAR FIBROMA RESECTION RIGHT;  Surgeon: Caroline More, DPM;  Location: [place];  Service: Podiatry;  Laterality: Right;  Diabetic - oral meds  ? JOINT REPLACEMENT    ? KNEE ARTHROSCOPY Right   ? LUMBAR LAMINECTOMY/ DECOMPRESSION WITH MET-RX N/A 02/18/2019  ? Procedure: L4-5 DECOMPRESSION;  Surgeon: Meade Maw, MD;  Location: [facility] ORS;  Service: Neurosurgery;  Laterality: N/A;  ? TOTAL HIP ARTHROPLASTY    ? TOTAL KNEE ARTHROPLASTY Right   ? ? ?FAMILY HISTORY ?Family History  ?Problem Relation Age of Onset  ? Clotting disorder Father   ? Hypertension Father   ? ? ?SOCIAL HISTORY ?Social History  ? ?Tobacco Use  ? Smoking status: Never  ? Smokeless tobacco: Former  ?  Types: Chew  ?  Quit date: 02/08/1978  ?Vaping Use  ? Vaping Use: Never used  ?Substance Use Topics  ? Alcohol use: Yes  ?  Comment: RARE  ? Drug use: No  ? ?  ? ?  ? ?OPHTHALMIC EXAM: ? ?Base Eye Exam   ? ? Visual Acuity (ETDRS)   ? ?   Right Left  ? Dist [place] 20/40 20/40 -3  ? Dist ph [place] NI NI  ? ?  ?  ? ? Tonometry (Tonopen, 8:13 AM)   ? ?   Right Left  ? Pressure 19 20  ? ?  ?  ? ? Pupils   ? ?   Pupils APD  ? Right PERRL None  ? Left PERRL None  ? ?  ?  ? ? Visual Fields   ? ?   Left Right  ?  Full Full  ? ?  ?  ? ? Extraocular Movement   ? ?   Right Left  ?  Full, Ortho Full, Ortho  ? ?  ?  ? ? Neuro/Psych   ? ? Oriented x3: Yes  ? Mood/Affect: Normal  ? ?  ?  ? ? Dilation   ? ? Both eyes: 1.0% Mydriacyl, 2.5% Phenylephrine @ 8:12 AM  ? ?  ?  ? ?  ? ?Slit Lamp and Fundus Exam   ? ? External Exam   ? ?   Right Left   ? External Normal Normal  ? ?  ?  ? ? Slit Lamp Exam   ? ?   Right Left  ? Lids/Lashes Normal Normal  ? Conjunctiva/Sclera White and quiet White and quiet  ?  Cornea Clear, no signs of abrasion today no epithelial defect no Clear  ? Anterior Chamber Deep and quiet, no cells Deep and quiet  ? Iris Round and reactive Round and reactive  ? Lens Posterior chamber intraocular lens, , Open posterior capsule Posterior chamber intraocular lens, 1+ Posterior capsular opacification  ? Anterior Vitreous Normal, no cells Normal  ? ?  ?  ? ? Fundus Exam   ? ?   Right Left  ? Posterior Vitreous Normal Normal  ? Disc Normal Normal  ? C/D Ratio 0.0 0.0  ? Macula  Exudates in a circinate ring temporally now with good focal laser., Microaneurysms Microaneurysms, no clinically significant macular edema, no focal laser scars  ? Vessels NPDR-Severe NPDR-Severe  ? Periphery Normal, good retinopexy to tear at 1030 location,  Normal  ? ?  ?  ? ?  ? ? ?IMAGING AND PROCEDURES  ?Imaging and Procedures for 04/25/21 ? ?OCT, Retina - OU - Both Eyes   ? ?   ?Right Eye ?Quality was borderline. Scan locations included subfoveal. Progression has been stable. Findings include no IRF, no SRF.  ? ?Left Eye ?Quality was good. Scan locations included subfoveal. Findings include normal foveal contour, no IRF, no SRF, retinal drusen .  ? ?Notes ?No active CSME OD.  Vastly improved as compared to last photographs 12-09-19.  We will continue to observe ? ?OS no active CSME ? ?  ? ? ?  ?  ? ?  ?ASSESSMENT/PLAN: ? ?Severe nonproliferative diabetic retinopathy of right eye, with macular edema, associated with type 2 diabetes mellitus ([place]) ?The nature of severe nonproliferative diabetic retinopathy discussed with the patient as well as the need for more frequent follow up and likely progression to proliferative disease in the near future. The options of continued observation versus panretinal photocoagulation at this time were reviewed as well as the risks,  benefits, and alternatives. More recent option includes the use of ocular injectable medications to slow progression of retinal disease. Tight control of glucose, blood pressure, and serum lipid levels were recommended

## 2021-04-25 NOTE — Assessment & Plan Note (Signed)
No progression OU 

## 2021-04-25 NOTE — Assessment & Plan Note (Signed)
Minor currently will observe ?

## 2021-04-28 DIAGNOSIS — Z0100 Encounter for examination of eyes and vision without abnormal findings: Secondary | ICD-10-CM | POA: Diagnosis not present

## 2021-05-03 DIAGNOSIS — I129 Hypertensive chronic kidney disease with stage 1 through stage 4 chronic kidney disease, or unspecified chronic kidney disease: Secondary | ICD-10-CM | POA: Diagnosis not present

## 2021-05-03 DIAGNOSIS — R079 Chest pain, unspecified: Secondary | ICD-10-CM | POA: Diagnosis not present

## 2021-05-03 DIAGNOSIS — I679 Cerebrovascular disease, unspecified: Secondary | ICD-10-CM | POA: Diagnosis not present

## 2021-05-03 DIAGNOSIS — G4733 Obstructive sleep apnea (adult) (pediatric): Secondary | ICD-10-CM | POA: Diagnosis not present

## 2021-05-03 DIAGNOSIS — I10 Essential (primary) hypertension: Secondary | ICD-10-CM | POA: Diagnosis not present

## 2021-05-03 DIAGNOSIS — E1122 Type 2 diabetes mellitus with diabetic chronic kidney disease: Secondary | ICD-10-CM | POA: Diagnosis not present

## 2021-05-03 DIAGNOSIS — N183 Chronic kidney disease, stage 3 unspecified: Secondary | ICD-10-CM | POA: Diagnosis not present

## 2021-05-03 DIAGNOSIS — E7800 Pure hypercholesterolemia, unspecified: Secondary | ICD-10-CM | POA: Diagnosis not present

## 2021-05-08 ENCOUNTER — Ambulatory Visit: Admit: 2021-05-08 | Payer: PPO | Admitting: Gastroenterology

## 2021-05-08 SURGERY — COLONOSCOPY WITH PROPOFOL
Anesthesia: General

## 2021-05-09 ENCOUNTER — Encounter: Payer: Self-pay | Admitting: Gastroenterology

## 2021-05-09 ENCOUNTER — Ambulatory Visit (INDEPENDENT_AMBULATORY_CARE_PROVIDER_SITE_OTHER): Payer: PPO | Admitting: Gastroenterology

## 2021-05-09 VITALS — BP 136/85 | HR 70 | Temp 97.9°F | Ht 69.0 in | Wt 205.0 lb

## 2021-05-09 DIAGNOSIS — K625 Hemorrhage of anus and rectum: Secondary | ICD-10-CM | POA: Diagnosis not present

## 2021-05-09 NOTE — Progress Notes (Signed)
? ? ?Gastroenterology Consultation ? ?Referring Provider:     Kirk Ruths, MD ?Primary Care Physician:  Kirk Ruths, MD ?Primary Gastroenterologist:  Dr. Allen Norris     ?Reason for Consultation:     Rectal bleeding ?      ? HPI:   ?Michael Humphrey is a 71 y.o. y/o male referred for consultation & management of rectal bleeding by Dr. Ouida Sills, Ocie Cornfield, MD. this patient comes in today with a report of rectal bleeding.  The patient had been seen by Dr. Gustavo Lah back in 2016 for rectal bleeding and had a colonoscopy with biopsies that time that showed: ? ?DIAGNOSIS:  ?A. ILEOCECAL VALVE POLYP ?2, DISTAL; COLD BIOPSY:  ?- POLYPOID FRAGMENTS OF MILDLY INFLAMED COLONIC MUCOSA (2).  ?- NEGATIVE FOR DYSPLASIA AND MALIGNANCY.  ? ?B. COLON POLYP ?2, HEPATIC FLEXURE; COLD BIOPSY:  ?- TUBULAR ADENOMA.  ?- NEGATIVE FOR HIGH GRADE DYSPLASIA AND MALIGNANCY.  ? ?There was diverticulosis found during the procedure and the retroflexed views were reported to be normal. ? ?The patient reports that he had 2 days of bright red blood per rectum.  He had no abdominal pain.  The patient has lost a significant amount of weight but him and his wife have been on a high-protein low carbohydrate diet and has been trying to lose weight. ? ? ?Past Medical History:  ?Diagnosis Date  ? Arthritis   ? Cancer [facility])   ? SKIN CANCER  ? Diabetes mellitus without complication ([place])   ? DVT (deep venous thrombosis) ([place]) 07/2017  ? nonocclusive DVT left internal jugular   ? GERD (gastroesophageal reflux disease)   ? H/O  ? High cholesterol   ? Hordeolum internum right lower eyelid 12/09/2019  ? Hypertension   ? Lumbar radiculitis   ? Stroke [facility])   ? 07/2017  ? Wears dentures   ? full upper and lower  ? ? ?Past Surgical History:  ?Procedure Laterality Date  ? AMPUTATION TOE Left 01/15/2020  ? Procedure: AMPUTATION TOE MPJ T1;  Surgeon: Samara Deist, DPM;  Location: [facility];  Service: Podiatry;  Laterality: Left;  ? BUNIONECTOMY    ?  CATARACT EXTRACTION W/PHACO Left 12/11/2017  ? Procedure: CATARACT EXTRACTION PHACO AND INTRAOCULAR LENS PLACEMENT ([place])  LEFT DIABETIC;  Surgeon: Leandrew Koyanagi, MD;  Location: [place];  Service: Ophthalmology;  Laterality: Left;  DIABETIC ([facility]A picking pt up at 0600, needs 0630 arrival time)  ? CATARACT EXTRACTION W/PHACO Right 01/15/2018  ? Procedure: CATARACT EXTRACTION PHACO AND INTRAOCULAR LENS PLACEMENT ([place])  RIGHT DIABETIC  leave so arrival will be around 7:45 ds;  Surgeon: Leandrew Koyanagi, MD;  Location: [place];  Service: Ophthalmology;  Laterality: Right;  Diabetic - oral meds  ? CHOLECYSTECTOMY    ? COLONOSCOPY    ? COLONOSCOPY WITH PROPOFOL N/A 12/17/2014  ? Procedure: COLONOSCOPY WITH PROPOFOL;  Surgeon: Lollie Sails, MD;  Location: [facility] END[facility]COPY;  Service: Endoscopy;  Laterality: N/A;  ? correct hammertoe    ? FASCIECTOMY Right 10/29/2019  ? Procedure: PLANTAR FIBROMA RESECTION RIGHT;  Surgeon: Caroline More, DPM;  Location: [place];  Service: Podiatry;  Laterality: Right;  Diabetic - oral meds  ? JOINT REPLACEMENT    ? KNEE ARTHROSCOPY Right   ? LUMBAR LAMINECTOMY/ DECOMPRESSION WITH MET-RX N/A 02/18/2019  ? Procedure: L4-5 DECOMPRESSION;  Surgeon: Meade Maw, MD;  Location: [facility];  Service: Neurosurgery;  Laterality: N/A;  ? TOTAL HIP ARTHROPLASTY    ? TOTAL KNEE  ARTHROPLASTY Right   ? ? ?Prior to Admission medications   ?Medication Sig Start Date End Date Taking? Authorizing Provider  ?aspirin EC 81 MG tablet Take 81 mg by mouth daily. Swallow whole.    [provider]  ?carvedilol (COREG) 12.5 MG tablet Take 12.5 mg by mouth 2 (two) times daily.  12/04/17   [provider]  ?finasteride (PROSCAR) 5 MG tablet Take 5 mg by mouth every morning.     [provider]  ?gabapentin (NEURONTIN) 600 MG tablet Take 600 mg by mouth 2 (two) times daily.    [provider]  ?glimepiride (AMARYL) 2 MG tablet Take 2  mg by mouth daily with breakfast.    [provider]  ?lisinopril (PRINIVIL,ZESTRIL) 2.5 MG tablet Take 1 tablet (2.5 mg total) by mouth daily. ?Patient taking differently: Take 2.5 mg by mouth every morning. 02/28/18   Henreitta Leber, MD  ?metFORMIN (GLUCOPHAGE) 500 MG tablet Take 500 mg by mouth 2 (two) times daily.    [provider]  ?methocarbamol (ROBAXIN) 500 MG tablet Take 1 tablet (500 mg total) by mouth every 8 (eight) hours as needed for muscle spasms. 07/16/20   Vladimir Crofts, MD  ?torsemide (DEMADEX) 20 MG tablet Take 20 mg by mouth daily as needed (swelling).    [provider]  ?traZODone (DESYREL) 100 MG tablet Take 1 tablet (100 mg total) by mouth at bedtime as needed for sleep. 07/17/17 10/22/19  Saundra Shelling, MD  ? ? ?Family History  ?Problem Relation Age of Onset  ? Clotting disorder Father   ? Hypertension Father   ?  ? ?Social History  ? ?Tobacco Use  ? Smoking status: Never  ? Smokeless tobacco: Former  ?  Types: Chew  ?  Quit date: 02/08/1978  ?Vaping Use  ? Vaping Use: Never used  ?Substance Use Topics  ? Alcohol use: Yes  ?  Comment: RARE  ? Drug use: No  ? ? ?Allergies as of 05/09/2021 - Review Complete 04/25/2021  ?Allergen Reaction Noted  ? Oxycodone  02/26/2018  ? Clonidine derivatives  12/16/2014  ? Fioricet [butalbital-apap-caffeine]  12/16/2014  ? Hydrocodone-acetaminophen  12/16/2014  ? Mobic [meloxicam]  12/16/2014  ? Norvasc [amlodipine besylate]  12/16/2014  ? Statins  12/16/2014  ? Tramadol Itching 12/04/2016  ? ? ?Review of Systems:    ?All systems reviewed and negative except where noted in HPI. ? ? Physical Exam:  ?There were no vitals taken for this visit. ?No LMP for male patient. ?General:   Alert,  Well-developed, well-nourished, pleasant and cooperative in NAD ?Head:  Normocephalic and atraumatic. ?Eyes:  Sclera clear, no icterus.   Conjunctiva pink. ?Ears:  Normal auditory acuity. ?Neck:  Supple; no masses or thyromegaly. ?Lungs:  Respirations  even and unlabored.  Clear throughout to auscultation.   No wheezes, crackles, or rhonchi. No acute distress. ?Heart:  Regular rate and rhythm; no murmurs, clicks, rubs, or gallops. ?Abdomen:  Normal bowel sounds.  No bruits.  Soft, non-tender and non-distended without masses, hepatosplenomegaly or hernias noted.  No guarding or rebound tenderness.  Negative Carnett sign.   ?Rectal:  Deferred.  ?Pulses:  Normal pulses noted. ?Extremities:  No clubbing or edema.  No cyanosis. ?Neurologic:  Alert and oriented x3;  grossly normal neurologically. ?Skin:  Intact without significant lesions or rashes.  No jaundice. ?Lymph Nodes:  No significant cervical adenopathy. ?Psych:  Alert and cooperative. Normal mood and affect. ? ?Imaging Studies: ?OCT, Retina - OU - Both  Eyes ? ?Result Date: 04/25/2021 ?Right Eye Quality was borderline. Scan locations included subfoveal. Progression has been stable. Findings include no IRF, no SRF. Left Eye Quality was good. Scan locations included subfoveal. Findings include normal foveal contour, no IRF, no SRF, retinal drusen . Notes No active CSME OD.  Vastly improved as compared to last photographs 12-09-19.  We will continue to observe OS no active CSME  ? ?Assessment and Plan:  ? ?Michael Humphrey is a 71 y.o. y/o male comes in today with a history of rectal bleeding and a history of colon polyps.  The patient is due for repeat colonoscopy due to his history of polyps and his last colonoscopy being in 2016.  The patient will be set up for colonoscopy due to his history of colon polyps.  The patient has been explained the plan and agrees with it. ? ? ? ?Lucilla Lame, MD. Marval Regal ? ? ? Note: This dictation was prepared with Dragon dictation along with smaller phrase technology. Any transcriptional errors that result from this process are unintentional.   ?

## 2021-05-17 DIAGNOSIS — R079 Chest pain, unspecified: Secondary | ICD-10-CM | POA: Diagnosis not present

## 2021-05-25 DIAGNOSIS — I10 Essential (primary) hypertension: Secondary | ICD-10-CM | POA: Diagnosis not present

## 2021-05-25 DIAGNOSIS — E7800 Pure hypercholesterolemia, unspecified: Secondary | ICD-10-CM | POA: Diagnosis not present

## 2021-05-25 DIAGNOSIS — R079 Chest pain, unspecified: Secondary | ICD-10-CM | POA: Diagnosis not present

## 2021-05-25 DIAGNOSIS — N183 Chronic kidney disease, stage 3 unspecified: Secondary | ICD-10-CM | POA: Diagnosis not present

## 2021-05-25 DIAGNOSIS — E1122 Type 2 diabetes mellitus with diabetic chronic kidney disease: Secondary | ICD-10-CM | POA: Diagnosis not present

## 2021-05-25 DIAGNOSIS — G4733 Obstructive sleep apnea (adult) (pediatric): Secondary | ICD-10-CM | POA: Diagnosis not present

## 2021-05-25 DIAGNOSIS — I129 Hypertensive chronic kidney disease with stage 1 through stage 4 chronic kidney disease, or unspecified chronic kidney disease: Secondary | ICD-10-CM | POA: Diagnosis not present

## 2021-05-26 DIAGNOSIS — E113311 Type 2 diabetes mellitus with moderate nonproliferative diabetic retinopathy with macular edema, right eye: Secondary | ICD-10-CM | POA: Diagnosis not present

## 2021-05-30 DIAGNOSIS — K625 Hemorrhage of anus and rectum: Secondary | ICD-10-CM | POA: Diagnosis not present

## 2021-05-30 DIAGNOSIS — R079 Chest pain, unspecified: Secondary | ICD-10-CM | POA: Diagnosis not present

## 2021-05-30 DIAGNOSIS — J40 Bronchitis, not specified as acute or chronic: Secondary | ICD-10-CM | POA: Diagnosis not present

## 2021-06-08 ENCOUNTER — Other Ambulatory Visit: Payer: Self-pay

## 2021-06-08 ENCOUNTER — Encounter
Admission: RE | Disposition: A | Discharge status: Home / Self Care | Payer: Self-pay | Source: Home / Self Care | Attending: Cardiology

## 2021-06-08 ENCOUNTER — Ambulatory Visit
Admission: RE | Admit: 2021-06-08 | Discharge: 2021-06-08 | Disposition: A | Discharge status: Home / Self Care | Payer: PPO | Attending: Cardiology | Admitting: Cardiology

## 2021-06-08 ENCOUNTER — Encounter: Payer: Self-pay | Admitting: Cardiology

## 2021-06-08 DIAGNOSIS — I25798 Atherosclerosis of other coronary artery bypass graft(s) with other forms of angina pectoris: Secondary | ICD-10-CM | POA: Diagnosis not present

## 2021-06-08 DIAGNOSIS — R9430 Abnormal result of cardiovascular function study, unspecified: Secondary | ICD-10-CM

## 2021-06-08 DIAGNOSIS — I2582 Chronic total occlusion of coronary artery: Secondary | ICD-10-CM | POA: Insufficient documentation

## 2021-06-08 DIAGNOSIS — R079 Chest pain, unspecified: Secondary | ICD-10-CM | POA: Diagnosis not present

## 2021-06-08 DIAGNOSIS — I2510 Atherosclerotic heart disease of native coronary artery without angina pectoris: Secondary | ICD-10-CM | POA: Diagnosis not present

## 2021-06-08 HISTORY — DX: Cerebral infarction, unspecified: I63.9

## 2021-06-08 HISTORY — PX: LEFT HEART CATH AND CORONARY ANGIOGRAPHY: CATH118249

## 2021-06-08 HISTORY — DX: Acute myocardial infarction, unspecified: I21.9

## 2021-06-08 LAB — GLUCOSE, CAPILLARY: Glucose-Capillary: 132 mg/dL — ABNORMAL HIGH (ref 70–99)

## 2021-06-08 SURGERY — LEFT HEART CATH AND CORONARY ANGIOGRAPHY
Anesthesia: Moderate Sedation

## 2021-06-08 MED ORDER — MIDAZOLAM HCL 2 MG/2ML IJ SOLN
INTRAMUSCULAR | Status: DC | PRN
  Administered 2021-06-08: 1 mg via INTRAVENOUS
  Administered 2021-06-08: .5 mg via INTRAVENOUS

## 2021-06-08 MED ORDER — FENTANYL CITRATE (PF) 100 MCG/2ML IJ SOLN
INTRAMUSCULAR | Status: AC
  Filled 2021-06-08: qty 2

## 2021-06-08 MED ORDER — IOHEXOL 300 MG/ML  SOLN
INTRAMUSCULAR | Status: DC | PRN
  Administered 2021-06-08: 108 mL

## 2021-06-08 MED ORDER — SODIUM CHLORIDE 0.9 % WEIGHT BASED INFUSION: 1.0000 mL/kg/h | INTRAVENOUS | Status: DC

## 2021-06-08 MED ORDER — HEPARIN (PORCINE) IN NACL 1000-0.9 UT/500ML-% IV SOLN
INTRAVENOUS | Status: AC
  Filled 2021-06-08: qty 1000

## 2021-06-08 MED ORDER — SODIUM CHLORIDE 0.9% FLUSH: 3.0000 mL | Freq: Two times a day (BID) | INTRAVENOUS | Status: DC

## 2021-06-08 MED ORDER — MIDAZOLAM HCL 2 MG/2ML IJ SOLN
INTRAMUSCULAR | Status: AC
  Filled 2021-06-08: qty 2

## 2021-06-08 MED ORDER — SODIUM CHLORIDE 0.9 % IV SOLN: 250.0000 mL | INTRAVENOUS | Status: DC | PRN

## 2021-06-08 MED ORDER — VERAPAMIL HCL 2.5 MG/ML IV SOLN
INTRAVENOUS | Status: DC | PRN
  Administered 2021-06-08: 2.5 mg via INTRA_ARTERIAL

## 2021-06-08 MED ORDER — VERAPAMIL HCL 2.5 MG/ML IV SOLN
INTRAVENOUS | Status: AC
  Filled 2021-06-08: qty 2

## 2021-06-08 MED ORDER — FENTANYL CITRATE (PF) 100 MCG/2ML IJ SOLN
INTRAMUSCULAR | Status: DC | PRN
  Administered 2021-06-08 (×2): 25 ug via INTRAVENOUS

## 2021-06-08 MED ORDER — HEPARIN SODIUM (PORCINE) 1000 UNIT/ML IJ SOLN
INTRAMUSCULAR | Status: AC
  Filled 2021-06-08: qty 10

## 2021-06-08 MED ORDER — ASPIRIN 81 MG PO CHEW: 81.0000 mg | CHEWABLE_TABLET | ORAL | Status: DC

## 2021-06-08 MED ORDER — SODIUM CHLORIDE 0.9 % WEIGHT BASED INFUSION
3.0000 mL/kg/h | INTRAVENOUS | Status: AC
Start: 2021-06-08 — End: 2021-06-08
  Administered 2021-06-08: 3 mL/kg/h via INTRAVENOUS

## 2021-06-08 MED ORDER — HEPARIN (PORCINE) IN NACL 2000-0.9 UNIT/L-% IV SOLN
INTRAVENOUS | Status: DC | PRN
  Administered 2021-06-08: 1000 mL

## 2021-06-08 MED ORDER — LIDOCAINE HCL 1 % IJ SOLN
INTRAMUSCULAR | Status: AC
  Filled 2021-06-08: qty 20

## 2021-06-08 MED ORDER — SODIUM CHLORIDE 0.9% FLUSH: 3.0000 mL | INTRAVENOUS | Status: DC | PRN

## 2021-06-08 MED ORDER — ASPIRIN 81 MG PO CHEW
CHEWABLE_TABLET | ORAL | Status: AC
  Administered 2021-06-08: 81 mg
  Filled 2021-06-08: qty 1

## 2021-06-08 MED ORDER — HEPARIN SODIUM (PORCINE) 1000 UNIT/ML IJ SOLN
INTRAMUSCULAR | Status: DC | PRN
  Administered 2021-06-08: 4500 [IU] via INTRAVENOUS

## 2021-06-08 MED ORDER — LIDOCAINE HCL (PF) 1 % IJ SOLN
INTRAMUSCULAR | Status: DC | PRN
  Administered 2021-06-08: 2 mL

## 2021-06-08 SURGICAL SUPPLY — 15 items
BAND CMPR LRG ZPHR (HEMOSTASIS) ×1
BAND ZEPHYR COMPRESS 30 LONG (HEMOSTASIS) ×1 IMPLANT
CATH 5F 110X4 TIG (CATHETERS) ×1 IMPLANT
CATH INFINITI 5F PIG 125CM (CATHETERS) ×1 IMPLANT
CATH INFINITI 5FR JL5 (CATHETERS) ×1 IMPLANT
CATH INFINITI 5FR JR4 125CM (CATHETERS) ×1 IMPLANT
DRAPE BRACHIAL (DRAPES) ×1 IMPLANT
GLIDESHEATH SLEND SS 6F .021 (SHEATH) ×1 IMPLANT
GUIDEWIRE INQWIRE 1.5J.035X260 (WIRE) IMPLANT
INQWIRE 1.5J .035X260CM (WIRE) ×2
PACK CARDIAC CATH (CUSTOM PROCEDURE TRAY) ×2 IMPLANT
PROTECTION STATION PRESSURIZED (MISCELLANEOUS) ×2
SET ATX SIMPLICITY (MISCELLANEOUS) ×1 IMPLANT
SHEATH 6FR 75 DEST SLENDER (SHEATH) ×1 IMPLANT
STATION PROTECTION PRESSURIZED (MISCELLANEOUS) IMPLANT

## 2021-06-17 DIAGNOSIS — M47812 Spondylosis without myelopathy or radiculopathy, cervical region: Secondary | ICD-10-CM | POA: Diagnosis not present

## 2021-06-17 DIAGNOSIS — M542 Cervicalgia: Secondary | ICD-10-CM | POA: Diagnosis not present

## 2021-06-17 DIAGNOSIS — M50322 Other cervical disc degeneration at C5-C6 level: Secondary | ICD-10-CM | POA: Diagnosis not present

## 2021-06-17 DIAGNOSIS — S161XXA Strain of muscle, fascia and tendon at neck level, initial encounter: Secondary | ICD-10-CM | POA: Diagnosis not present

## 2021-06-19 DIAGNOSIS — I10 Essential (primary) hypertension: Secondary | ICD-10-CM | POA: Diagnosis not present

## 2021-06-19 DIAGNOSIS — Z125 Encounter for screening for malignant neoplasm of prostate: Secondary | ICD-10-CM | POA: Diagnosis not present

## 2021-06-19 DIAGNOSIS — M791 Myalgia, unspecified site: Secondary | ICD-10-CM | POA: Diagnosis not present

## 2021-06-19 DIAGNOSIS — I129 Hypertensive chronic kidney disease with stage 1 through stage 4 chronic kidney disease, or unspecified chronic kidney disease: Secondary | ICD-10-CM | POA: Diagnosis not present

## 2021-06-19 DIAGNOSIS — E1122 Type 2 diabetes mellitus with diabetic chronic kidney disease: Secondary | ICD-10-CM | POA: Diagnosis not present

## 2021-06-19 DIAGNOSIS — T466X5A Adverse effect of antihyperlipidemic and antiarteriosclerotic drugs, initial encounter: Secondary | ICD-10-CM | POA: Diagnosis not present

## 2021-06-19 DIAGNOSIS — N183 Chronic kidney disease, stage 3 unspecified: Secondary | ICD-10-CM | POA: Diagnosis not present

## 2021-06-21 DIAGNOSIS — R079 Chest pain, unspecified: Secondary | ICD-10-CM | POA: Diagnosis not present

## 2021-06-21 DIAGNOSIS — Z86711 Personal history of pulmonary embolism: Secondary | ICD-10-CM | POA: Diagnosis not present

## 2021-06-21 DIAGNOSIS — E7800 Pure hypercholesterolemia, unspecified: Secondary | ICD-10-CM | POA: Diagnosis not present

## 2021-06-21 DIAGNOSIS — I10 Essential (primary) hypertension: Secondary | ICD-10-CM | POA: Diagnosis not present

## 2021-06-21 DIAGNOSIS — Z9889 Other specified postprocedural states: Secondary | ICD-10-CM | POA: Diagnosis not present

## 2021-06-21 DIAGNOSIS — M791 Myalgia, unspecified site: Secondary | ICD-10-CM | POA: Diagnosis not present

## 2021-06-21 DIAGNOSIS — I779 Disorder of arteries and arterioles, unspecified: Secondary | ICD-10-CM | POA: Diagnosis not present

## 2021-06-21 DIAGNOSIS — I129 Hypertensive chronic kidney disease with stage 1 through stage 4 chronic kidney disease, or unspecified chronic kidney disease: Secondary | ICD-10-CM | POA: Diagnosis not present

## 2021-06-21 DIAGNOSIS — G4733 Obstructive sleep apnea (adult) (pediatric): Secondary | ICD-10-CM | POA: Diagnosis not present

## 2021-06-21 DIAGNOSIS — T466X5A Adverse effect of antihyperlipidemic and antiarteriosclerotic drugs, initial encounter: Secondary | ICD-10-CM | POA: Diagnosis not present

## 2021-06-21 DIAGNOSIS — I679 Cerebrovascular disease, unspecified: Secondary | ICD-10-CM | POA: Diagnosis not present

## 2021-06-21 DIAGNOSIS — E1122 Type 2 diabetes mellitus with diabetic chronic kidney disease: Secondary | ICD-10-CM | POA: Diagnosis not present

## 2021-06-22 ENCOUNTER — Other Ambulatory Visit: Payer: Self-pay

## 2021-06-22 DIAGNOSIS — K625 Hemorrhage of anus and rectum: Secondary | ICD-10-CM

## 2021-06-23 ENCOUNTER — Telehealth: Payer: Self-pay

## 2021-06-26 DIAGNOSIS — I129 Hypertensive chronic kidney disease with stage 1 through stage 4 chronic kidney disease, or unspecified chronic kidney disease: Secondary | ICD-10-CM | POA: Diagnosis not present

## 2021-06-26 DIAGNOSIS — I779 Disorder of arteries and arterioles, unspecified: Secondary | ICD-10-CM | POA: Diagnosis not present

## 2021-06-26 DIAGNOSIS — M791 Myalgia, unspecified site: Secondary | ICD-10-CM | POA: Diagnosis not present

## 2021-06-26 DIAGNOSIS — I10 Essential (primary) hypertension: Secondary | ICD-10-CM | POA: Diagnosis not present

## 2021-06-26 DIAGNOSIS — E1122 Type 2 diabetes mellitus with diabetic chronic kidney disease: Secondary | ICD-10-CM | POA: Diagnosis not present

## 2021-06-26 DIAGNOSIS — E113299 Type 2 diabetes mellitus with mild nonproliferative diabetic retinopathy without macular edema, unspecified eye: Secondary | ICD-10-CM | POA: Diagnosis not present

## 2021-06-26 DIAGNOSIS — T466X5A Adverse effect of antihyperlipidemic and antiarteriosclerotic drugs, initial encounter: Secondary | ICD-10-CM | POA: Diagnosis not present

## 2021-06-26 DIAGNOSIS — E7800 Pure hypercholesterolemia, unspecified: Secondary | ICD-10-CM | POA: Diagnosis not present

## 2021-06-26 DIAGNOSIS — N183 Chronic kidney disease, stage 3 unspecified: Secondary | ICD-10-CM | POA: Diagnosis not present

## 2021-06-28 NOTE — Telephone Encounter (Signed)
Procedure clearance received, pt has been cleared

## 2021-07-27 ENCOUNTER — Encounter (INDEPENDENT_AMBULATORY_CARE_PROVIDER_SITE_OTHER): Payer: Self-pay | Admitting: Ophthalmology

## 2021-07-27 ENCOUNTER — Ambulatory Visit (INDEPENDENT_AMBULATORY_CARE_PROVIDER_SITE_OTHER): Payer: PPO | Admitting: Ophthalmology

## 2021-07-27 DIAGNOSIS — H26492 Other secondary cataract, left eye: Secondary | ICD-10-CM | POA: Diagnosis not present

## 2021-07-27 DIAGNOSIS — E113411 Type 2 diabetes mellitus with severe nonproliferative diabetic retinopathy with macular edema, right eye: Secondary | ICD-10-CM

## 2021-07-27 DIAGNOSIS — E113412 Type 2 diabetes mellitus with severe nonproliferative diabetic retinopathy with macular edema, left eye: Secondary | ICD-10-CM

## 2021-07-27 NOTE — Progress Notes (Signed)
07/27/2021     CHIEF COMPLAINT Patient presents for  Chief Complaint  Patient presents with   Diabetic Retinopathy without Macular Edema      HISTORY OF PRESENT ILLNESS: Michael Humphrey is a 71 y.o. male who presents to the clinic today for:     Referring physician: Lauro Regulus, MD [address] [facility] [place],  [place] [address]  HISTORICAL INFORMATION:   Selected notes from the MEDICAL RECORD NUMBER    Lab Results  Component Value Date   HGBA1C 7.1 (H) 07/15/2017     CURRENT MEDICATIONS: No current outpatient medications on file. (Ophthalmic Drugs)   No current facility-administered medications for this visit. (Ophthalmic Drugs)   Current Outpatient Medications (Other)  Medication Sig   aspirin EC 81 MG tablet Take 81 mg by mouth daily. Swallow whole.   carvedilol (COREG) 12.5 MG tablet Take 12.5 mg by mouth 2 (two) times daily.    finasteride (PROSCAR) 5 MG tablet Take 5 mg by mouth every morning.    gabapentin (NEURONTIN) 600 MG tablet Take 600 mg by mouth 2 (two) times daily.   glimepiride (AMARYL) 2 MG tablet Take 2 mg by mouth daily with breakfast.   lisinopril (PRINIVIL,ZESTRIL) 2.5 MG tablet Take 1 tablet (2.5 mg total) by mouth daily. (Patient taking differently: Take 2.5 mg by mouth every morning.)   metFORMIN (GLUCOPHAGE) 500 MG tablet Take 500 mg by mouth 2 (two) times daily.   methocarbamol (ROBAXIN) 500 MG tablet Take 1 tablet (500 mg total) by mouth every 8 (eight) hours as needed for muscle spasms.   torsemide (DEMADEX) 20 MG tablet Take 20 mg by mouth daily as needed (swelling).   traZODone (DESYREL) 100 MG tablet Take 1 tablet (100 mg total) by mouth at bedtime as needed for sleep.   No current facility-administered medications for this visit. (Other)      REVIEW OF SYSTEMS: ROS   Negative for: Constitutional, Gastrointestinal, Neurological, Skin, Genitourinary, Musculoskeletal, HENT, Endocrine,  Cardiovascular, Eyes, Respiratory, Psychiatric, Allergic/Imm, Heme/Lymph Last edited by Edmon Crape, MD on 07/27/2021  8:03 AM.       ALLERGIES Allergies  Allergen Reactions   Oxycodone     hallucinations   Clonidine Derivatives     Hallucinations    Fioricet [Butalbital-Apap-Caffeine]     hallucination   Hydrocodone-Acetaminophen     hallucinations   Mobic [Meloxicam]     hallucinations   Norvasc [Amlodipine Besylate]     hallucinations   Statins     Muscle cramps   Tramadol Itching    PAST MEDICAL HISTORY Past Medical History:  Diagnosis Date   Arthritis    Cancer [facility])    SKIN CANCER   CVA (cerebral vascular accident) ([facility])    Diabetes mellitus without complication ([facility])    DVT (deep venous thrombosis) ([facility]) 07/2017   nonocclusive DVT left internal jugular    GERD (gastroesophageal reflux disease)    H/O   High cholesterol    Hordeolum internum right lower eyelid 12/09/2019   Hypertension    Lumbar radiculitis    Myocardial infarction [facility])    Posterior capsular opacification, 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 Janee Morn recently and sent here   Stroke [facility])    07/2017   Wears dentures    full upper and lower   Past Surgical History:  Procedure Laterality Date   AMPUTATION TOE Left 01/15/2020   Procedure: AMPUTATION TOE MPJ T1;  Surgeon: Ether Griffins,  Larkin Ina, DPM;  Location: [facility];  Service: Podiatry;  Laterality: Left;   BUNIONECTOMY     CATARACT EXTRACTION W/PHACO Left 12/11/2017   Procedure: CATARACT EXTRACTION PHACO AND INTRAOCULAR LENS PLACEMENT ([place])  LEFT DIABETIC;  Surgeon: Leandrew Koyanagi, MD;  Location: [place];  Service: Ophthalmology;  Laterality: Left;  DIABETIC (ACTA picking pt up at 0600, needs 0630 arrival time)   CATARACT EXTRACTION W/PHACO Right 01/15/2018   Procedure: CATARACT EXTRACTION PHACO AND INTRAOCULAR LENS PLACEMENT ([place])  RIGHT DIABETIC  leave so arrival will be around 7:45 ds;  Surgeon: Leandrew Koyanagi, MD;   Location: [place];  Service: Ophthalmology;  Laterality: Right;  Diabetic - oral meds   CHOLECYSTECTOMY     COLONOSCOPY     COLONOSCOPY WITH PROPOFOL N/A 12/17/2014   Procedure: COLONOSCOPY WITH PROPOFOL;  Surgeon: Lollie Sails, MD;  Location: [facility] ENDOSCOPY;  Service: Endoscopy;  Laterality: N/A;   correct hammertoe     FASCIECTOMY Right 10/29/2019   Procedure: PLANTAR FIBROMA RESECTION RIGHT;  Surgeon: Caroline More, DPM;  Location: [place];  Service: Podiatry;  Laterality: Right;  Diabetic - oral meds   JOINT REPLACEMENT     KNEE ARTHROSCOPY Right    LEFT HEART CATH AND CORONARY ANGIOGRAPHY N/A 06/08/2021   Procedure: LEFT HEART CATH AND CORONARY ANGIOGRAPHY;  Surgeon: Isaias Cowman, MD;  Location: [place] CV LAB;  Service: Cardiovascular;  Laterality: N/A;   LUMBAR LAMINECTOMY/ DECOMPRESSION WITH MET-RX N/A 02/18/2019   Procedure: L4-5 DECOMPRESSION;  Surgeon: Meade Maw, MD;  Location: [facility];  Service: Neurosurgery;  Laterality: N/A;   TOTAL HIP ARTHROPLASTY Bilateral    TOTAL KNEE ARTHROPLASTY Bilateral     FAMILY HISTORY Family History  Problem Relation Age of Onset   Clotting disorder Father    Hypertension Father     SOCIAL HISTORY Social History   Tobacco Use   Smoking status: Never   Smokeless tobacco: Former    Types: Chew    Quit date: 02/08/1978  Vaping Use   Vaping Use: Never used  Substance Use Topics   Alcohol use: Not Currently   Drug use: No         OPHTHALMIC EXAM:  Base Eye Exam     Visual Acuity (ETDRS)       Right Left   Dist [place] 20/40 -2 20/30 -1         Tonometry (Tonopen, 8:08 AM)       Right Left   Pressure 13 14         Pupils       Pupils APD   Right PERRL None   Left PERRL None         Visual Fields       Left Right    Full Full         Extraocular Movement       Right Left    Full, Ortho Full, Ortho         Neuro/Psych     Oriented x3: Yes    Mood/Affect: Normal         Dilation     Both eyes: 1.0% Mydriacyl, 2.5% Phenylephrine @ 8:08 AM           Slit Lamp and Fundus Exam     External Exam       Right Left   External Normal Normal         Slit Lamp Exam       Right Left  Lids/Lashes Normal Normal   Conjunctiva/Sclera White and quiet White and quiet   Cornea Clear, no signs of abrasion today no epithelial defect no Clear   Anterior Chamber Deep and quiet, no cells Deep and quiet   Iris Round and reactive Round and reactive   Lens Posterior chamber intraocular lens, , Open posterior capsule Posterior chamber intraocular lens, 2+ Posterior capsular opacification   Anterior Vitreous Normal, no cells Normal         Fundus Exam       Right Left   Posterior Vitreous Normal Normal   Disc Normal Normal   C/D Ratio 0.0 0.0   Macula  Exudates in a circinate ring temporally now with good focal laser., Microaneurysms Microaneurysms, no clinically significant macular edema, no focal laser scars   Vessels NPDR-Severe NPDR-Severe   Periphery Normal, good retinopexy to tear at 1030 location,  Normal            IMAGING AND PROCEDURES  Imaging and Procedures for 07/27/21  OCT, Retina - OU - Both Eyes       Right Eye Quality was borderline. Scan locations included subfoveal. Progression has been stable. Findings include no IRF, no SRF.   Left Eye Quality was good. Scan locations included subfoveal. Findings include normal foveal contour, no IRF, no SRF, retinal drusen .   Notes No active CSME OD.  Vastly improved as compared to last photographs 12-09-19.  We will continue to observe   OS no active CSME              ASSESSMENT/PLAN:  Posterior capsular opacification, left Now center visual axis involved.  Could be accounting for the patient's haze in the left eye.  Will need YAG capsulotomy to maximize visual potential.  Severe nonproliferative diabetic retinopathy of left eye, with  macular edema, associated with type 2 diabetes mellitus (HCC) No active diabetic retinopathy involving the macula  Severe nonproliferative diabetic retinopathy of right eye, with macular edema, associated with type 2 diabetes mellitus ([place]) No active diabetic maculopathy right eye either.     ICD-10-CM   1. Severe nonproliferative diabetic retinopathy of right eye, with macular edema, associated with type 2 diabetes mellitus (HCC)  E11.3411 OCT, Retina - OU - Both Eyes    2. Severe nonproliferative diabetic retinopathy of left eye, with macular edema, associated with type 2 diabetes mellitus (HCC)  R44.3154 OCT, Retina - OU - Both Eyes    3. Posterior capsular opacification, left  H26.492       1.  OU will need final lens prescription with [facility] and previous physicians.  The cause of his vision is not the retina  2.  Patient does have visually significant posterior capsule opacity left eye, and will proceed today with YAG simply to maximize visual potential.  3.  Ophthalmic Meds Ordered this visit:  No orders of the defined types were placed in this encounter.      Return in about 4 months (around 11/27/2021) for DILATE OU, COLOR FP, OCT.  There are no Patient Instructions on file for this visit.   Explained the diagnoses, plan, and follow up with the patient and they expressed understanding.  Patient expressed understanding of the importance of proper follow up care.   Clent Demark Beverly Ferner M.D. Diseases & Surgery of the Retina and Vitreous Retina & Diabetic [place] 07/27/21     Abbreviations: M myopia (nearsighted); A astigmatism; H hyperopia (farsighted); P presbyopia; Mrx spectacle prescription;  CTL contact  lenses; OD right eye; OS left eye; OU both eyes  XT exotropia; ET esotropia; PEK punctate epithelial keratitis; PEE punctate epithelial erosions; DES dry eye syndrome; MGD meibomian gland dysfunction; ATs artificial tears; PFAT's preservative free  artificial tears; [place] nuclear sclerotic cataract; PSC posterior subcapsular cataract; ERM epi-retinal membrane; PVD posterior vitreous detachment; RD retinal detachment; DM diabetes mellitus; DR diabetic retinopathy; NPDR non-proliferative diabetic retinopathy; PDR proliferative diabetic retinopathy; CSME clinically significant macular edema; DME diabetic macular edema; dbh dot blot hemorrhages; CWS cotton wool spot; POAG primary open angle glaucoma; C/D cup-to-disc ratio; HVF humphrey visual field; GVF goldmann visual field; OCT optical coherence tomography; IOP intraocular pressure; BRVO Branch retinal vein occlusion; CRVO central retinal vein occlusion; CRAO central retinal artery occlusion; BRAO branch retinal artery occlusion; RT retinal tear; SB scleral buckle; PPV pars plana vitrectomy; VH Vitreous hemorrhage; PRP panretinal laser photocoagulation; IVK intravitreal kenalog; VMT vitreomacular traction; MH Macular hole;  NVD neovascularization of the disc; NVE neovascularization elsewhere; AREDS age related eye disease study; ARMD age related macular degeneration; POAG primary open angle glaucoma; EBMD epithelial/anterior basement membrane dystrophy; ACIOL anterior chamber intraocular lens; IOL intraocular lens; PCIOL posterior chamber intraocular lens; Phaco/IOL phacoemulsification with intraocular lens placement; [place] photorefractive keratectomy; LASIK laser assisted in situ keratomileusis; HTN hypertension; DM diabetes mellitus; COPD chronic obstructive pulmonary disease

## 2021-07-27 NOTE — Assessment & Plan Note (Signed)
No active diabetic maculopathy right eye either.

## 2021-07-27 NOTE — Assessment & Plan Note (Signed)
No active diabetic retinopathy involving the macula

## 2021-07-27 NOTE — Assessment & Plan Note (Addendum)
Now center visual axis involved.  Could be accounting for the patient's haze in the left eye.  Will need YAG capsulotomy to maximize visual potential.

## 2021-08-10 ENCOUNTER — Encounter (INDEPENDENT_AMBULATORY_CARE_PROVIDER_SITE_OTHER): Payer: Self-pay | Admitting: Ophthalmology

## 2021-08-10 ENCOUNTER — Encounter (INDEPENDENT_AMBULATORY_CARE_PROVIDER_SITE_OTHER): Payer: PPO | Admitting: Ophthalmology

## 2021-08-10 ENCOUNTER — Ambulatory Visit (INDEPENDENT_AMBULATORY_CARE_PROVIDER_SITE_OTHER): Payer: PPO | Admitting: Ophthalmology

## 2021-08-10 DIAGNOSIS — H26492 Other secondary cataract, left eye: Secondary | ICD-10-CM

## 2021-08-10 NOTE — Progress Notes (Signed)
08/10/2021     CHIEF COMPLAINT Patient presents for  Chief Complaint  Patient presents with   Blurred Vision      HISTORY OF PRESENT ILLNESS: Michael Humphrey is a 71 y.o. male who presents to the clinic today for:   HPI     Blurred Vision           Laterality: left eye         Comments   With cloudy capsule, for planned 2 weeks yag capsulotomy os Pt states his vision has been stable  Pt denies any new floaters or FOL OS with YAG capsulotomy today        Last edited by Hurman Horn, MD on 08/10/2021  9:04 AM.      Referring physician: Kirk Ruths, MD [facility] [place],  [place] [address]  HISTORICAL INFORMATION:   Selected notes from the MEDICAL RECORD NUMBER    Lab Results  Component Value Date   HGBA1C 7.1 (H) 07/15/2017     CURRENT MEDICATIONS: No current outpatient medications on file. (Ophthalmic Drugs)   No current facility-administered medications for this visit. (Ophthalmic Drugs)   Current Outpatient Medications (Other)  Medication Sig   aspirin EC 81 MG tablet Take 81 mg by mouth daily. Swallow whole.   carvedilol (COREG) 12.5 MG tablet Take 12.5 mg by mouth 2 (two) times daily.    finasteride (PROSCAR) 5 MG tablet Take 5 mg by mouth every morning.    gabapentin (NEURONTIN) 600 MG tablet Take 600 mg by mouth 2 (two) times daily.   glimepiride (AMARYL) 2 MG tablet Take 2 mg by mouth daily with breakfast.   lisinopril (PRINIVIL,ZESTRIL) 2.5 MG tablet Take 1 tablet (2.5 mg total) by mouth daily. (Patient taking differently: Take 2.5 mg by mouth every morning.)   metFORMIN (GLUCOPHAGE) 500 MG tablet Take 500 mg by mouth 2 (two) times daily.   methocarbamol (ROBAXIN) 500 MG tablet Take 1 tablet (500 mg total) by mouth every 8 (eight) hours as needed for muscle spasms.   torsemide (DEMADEX) 20 MG tablet Take 20 mg by mouth daily as needed (swelling).   traZODone (DESYREL) 100 MG tablet Take 1  tablet (100 mg total) by mouth at bedtime as needed for sleep.   No current facility-administered medications for this visit. (Other)      REVIEW OF SYSTEMS: ROS   Negative for: Constitutional, Gastrointestinal, Neurological, Skin, Genitourinary, Musculoskeletal, HENT, Endocrine, Cardiovascular, Eyes, Respiratory, Psychiatric, Allergic/Imm, Heme/Lymph Last edited by Morene Rankins, CMA on 08/10/2021  8:30 AM.       ALLERGIES Allergies  Allergen Reactions   Oxycodone     hallucinations   Clonidine Derivatives     Hallucinations    Fioricet [Butalbital-Apap-Caffeine]     hallucination   Hydrocodone-Acetaminophen     hallucinations   Mobic [Meloxicam]     hallucinations   Norvasc [Amlodipine Besylate]     hallucinations   Statins     Muscle cramps   Tramadol Itching    PAST MEDICAL HISTORY Past Medical History:  Diagnosis Date   Arthritis    Cancer ([place])    SKIN CANCER   CVA (cerebral vascular accident) ([place])    Diabetes mellitus without complication ([place])    DVT (deep venous thrombosis) ([place]) 07/2017   nonocclusive DVT left internal jugular    GERD (gastroesophageal reflux disease)    H/O   High cholesterol    Hordeolum internum right  lower eyelid 12/09/2019   Hypertension    Lumbar radiculitis    Myocardial infarction [facility])    Posterior capsular opacification, 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 Orion Modest recently and sent here   Stroke [facility])    07/2017   Wears dentures    full upper and lower   Past Surgical History:  Procedure Laterality Date   AMPUTATION TOE Left 01/15/2020   Procedure: AMPUTATION TOE MPJ T1;  Surgeon: Samara Deist, DPM;  Location: [facility] ORS;  Service: Podiatry;  Laterality: Left;   BUNIONECTOMY     CATARACT EXTRACTION W/PHACO Left 12/11/2017   Procedure: CATARACT EXTRACTION PHACO AND INTRAOCULAR LENS PLACEMENT ([place])  LEFT DIABETIC;  Surgeon: Leandrew Koyanagi, MD;  Location: [place];  Service: Ophthalmology;   Laterality: Left;  DIABETIC (ACTA picking pt up at 0600, needs 0630 arrival time)   CATARACT EXTRACTION W/PHACO Right 01/15/2018   Procedure: CATARACT EXTRACTION PHACO AND INTRAOCULAR LENS PLACEMENT ([place])  RIGHT DIABETIC  leave so arrival will be around 7:45 ds;  Surgeon: Leandrew Koyanagi, MD;  Location: [place];  Service: Ophthalmology;  Laterality: Right;  Diabetic - oral meds   CHOLECYSTECTOMY     COLONOSCOPY     COLONOSCOPY N/A 08/22/2021   Procedure: COLONOSCOPY;  Surgeon: Lucilla Lame, MD;  Location[facility] ENDOSCOPY;  Service: Endoscopy;  Laterality: N/A;   COLONOSCOPY WITH PROPOFOL N/A 12/17/2014   Procedure: COLONOSCOPY WITH PROPOFOL;  Surgeon: Lollie Sails, MD;  Location: [facility] ENDOSCOPY;  Service: Endoscopy;  Laterality: N/A;   correct hammertoe     FASCIECTOMY Right 10/29/2019   Procedure: PLANTAR FIBROMA RESECTION RIGHT;  Surgeon: Caroline More, DPM;  Location: [place];  Service: Podiatry;  Laterality: Right;  Diabetic - oral meds   JOINT REPLACEMENT     KNEE ARTHROSCOPY Right    LEFT HEART CATH AND CORONARY ANGIOGRAPHY N/A 06/08/2021   Procedure: LEFT HEART CATH AND CORONARY ANGIOGRAPHY;  Surgeon: Isaias Cowman, MD;  Location: [place] CV LAB;  Service: Cardiovascular;  Laterality: N/A;   LUMBAR LAMINECTOMY/ DECOMPRESSION WITH MET-RX N/A 02/18/2019   Procedure: L4-5 DECOMPRESSION;  Surgeon: Meade Maw, MD;  Location: [facility] ORS;  Service: Neurosurgery;  Laterality: N/A;   TOTAL HIP ARTHROPLASTY Bilateral    TOTAL KNEE ARTHROPLASTY Bilateral     FAMILY HISTORY Family History  Problem Relation Age of Onset   Clotting disorder Father    Hypertension Father     SOCIAL HISTORY Social History   Tobacco Use   Smoking status: Never   Smokeless tobacco: Former    Types: Chew    Quit date: 02/08/1978  Vaping Use   Vaping Use: Never used  Substance Use Topics   Alcohol use: Not Currently   Drug use: No         OPHTHALMIC  EXAM:  Base Eye Exam     Visual Acuity (ETDRS)       Right Left   Dist [place] 20/30 20/40 +1         Tonometry (Tonopen, 8:35 AM)       Right Left   Pressure 11 13         Pupils       Shape   Right Round   Left          Extraocular Movement       Right Left    Ortho Ortho    -- -- --  --  --  -- -- --   -- -- --  --  --  -- -- --  Neuro/Psych     Oriented x3: Yes   Mood/Affect: Normal         Dilation     Left eye: 2.5% Phenylephrine, 1.0% Mydriacyl @ 8:32 AM           Slit Lamp and Fundus Exam     External Exam       Right Left   External Normal Normal         Slit Lamp Exam       Right Left   Lids/Lashes Normal Normal   Conjunctiva/Sclera White and quiet White and quiet   Cornea Clear, no signs of abrasion today no epithelial defect no Clear   Anterior Chamber Deep and quiet, no cells Deep and quiet   Iris Round and reactive Round and reactive   Lens Posterior chamber intraocular lens, , Open posterior capsule Posterior chamber intraocular lens, 2+ Posterior capsular opacification   Anterior Vitreous Normal, no cells Normal         Fundus Exam       Right Left   Posterior Vitreous  Normal   Disc  Normal   C/D Ratio  0.0   Macula  Microaneurysms, no clinically significant macular edema, no focal laser scars   Vessels  NPDR-Severe   Periphery  Normal            IMAGING AND PROCEDURES  Imaging and Procedures for 09/25/21  Yag Capsulotomy - OS - Left Eye       Time Out Confirmed correct patient, procedure, site, and patient consented.   Anesthesia Topical anesthesia was used. Anesthesia medications included Proparacaine.   Laser Information The type of laser was yag. Total spots was 50. The energy was 1 mj. Total energy was 50 mj.   Post-op The patient tolerated the procedure well. There were no complications. The patient received written and verbal post procedure care education.               ASSESSMENT/PLAN:  Posterior capsular opacification, left OS with central visual blurring from capsule opacity, YAG Performed Today Patient's Request     ICD-10-CM   1. Posterior capsular opacification, left  H26.492 Yag Capsulotomy - OS - Left Eye      1.  2.  3.  Ophthalmic Meds Ordered this visit:  No orders of the defined types were placed in this encounter.      Return in about 6 months (around 02/10/2022) for DILATE OU, COLOR FP, OCT.  There are no Patient Instructions on file for this visit.   Explained the diagnoses, plan, and follow up with the patient and they expressed understanding.  Patient expressed understanding of the importance of proper follow up care.   Clent Demark Bradshaw Minihan M.D. Diseases & Surgery of the Retina and Vitreous Retina [facility] Diabetic [place] 09/25/21     Abbreviations: M myopia (nearsighted); A astigmatism; H hyperopia (farsighted); P presbyopia; Mrx spectacle prescription;  CTL contact lenses; OD right eye; OS left eye; OU both eyes  XT exotropia; ET esotropia; PEK punctate epithelial keratitis; PEE punctate epithelial erosions; DES dry eye syndrome; MGD meibomian gland dysfunction; ATs artificial tears; PFAT's preservative free artificial tears; [place] nuclear sclerotic cataract; PSC posterior subcapsular cataract; ERM epi-retinal membrane; PVD posterior vitreous detachment; RD retinal detachment; DM diabetes mellitus; DR diabetic retinopathy; NPDR non-proliferative diabetic retinopathy; PDR proliferative diabetic retinopathy; CSME clinically significant macular edema; DME diabetic macular edema; dbh dot blot hemorrhages; CWS cotton wool spot; POAG primary open angle glaucoma; C/D cup-to-disc ratio;  HVF humphrey visual field; GVF goldmann visual field; OCT optical coherence tomography; IOP intraocular pressure; BRVO Branch retinal vein occlusion; CRVO central retinal vein occlusion; CRAO central retinal artery occlusion; BRAO branch retinal artery  occlusion; RT retinal tear; SB scleral buckle; PPV pars plana vitrectomy; VH Vitreous hemorrhage; PRP panretinal laser photocoagulation; IVK intravitreal kenalog; VMT vitreomacular traction; MH Macular hole;  NVD neovascularization of the disc; NVE neovascularization elsewhere; AREDS age related eye disease study; ARMD age related macular degeneration; POAG primary open angle glaucoma; EBMD epithelial/anterior basement membrane dystrophy; ACIOL anterior chamber intraocular lens; IOL intraocular lens; PCIOL posterior chamber intraocular lens; Phaco/IOL phacoemulsification with intraocular lens placement; [place] photorefractive keratectomy; LASIK laser assisted in situ keratomileusis; HTN hypertension; DM diabetes mellitus; COPD chronic obstructive pulmonary disease

## 2021-08-10 NOTE — Assessment & Plan Note (Signed)
OS with central visual blurring from capsule opacity, YAG Performed Today Patient's Request

## 2021-08-22 ENCOUNTER — Ambulatory Visit
Admission: RE | Disposition: A | Discharge status: Home / Self Care | Payer: Self-pay | Source: Home / Self Care | Attending: Gastroenterology

## 2021-08-22 ENCOUNTER — Other Ambulatory Visit: Payer: Self-pay

## 2021-08-22 ENCOUNTER — Encounter: Payer: Self-pay | Admitting: Gastroenterology

## 2021-08-22 ENCOUNTER — Ambulatory Visit
Admission: RE | Admit: 2021-08-22 | Discharge: 2021-08-22 | Disposition: A | Discharge status: Home / Self Care | Payer: PPO | Attending: Gastroenterology | Admitting: Gastroenterology

## 2021-08-22 ENCOUNTER — Ambulatory Visit: Payer: PPO | Admitting: Anesthesiology

## 2021-08-22 DIAGNOSIS — K640 First degree hemorrhoids: Secondary | ICD-10-CM | POA: Diagnosis not present

## 2021-08-22 DIAGNOSIS — K635 Polyp of colon: Secondary | ICD-10-CM

## 2021-08-22 DIAGNOSIS — K625 Hemorrhage of anus and rectum: Secondary | ICD-10-CM

## 2021-08-22 DIAGNOSIS — N183 Chronic kidney disease, stage 3 unspecified: Secondary | ICD-10-CM | POA: Diagnosis not present

## 2021-08-22 DIAGNOSIS — I129 Hypertensive chronic kidney disease with stage 1 through stage 4 chronic kidney disease, or unspecified chronic kidney disease: Secondary | ICD-10-CM | POA: Insufficient documentation

## 2021-08-22 DIAGNOSIS — Z86718 Personal history of other venous thrombosis and embolism: Secondary | ICD-10-CM | POA: Insufficient documentation

## 2021-08-22 DIAGNOSIS — E669 Obesity, unspecified: Secondary | ICD-10-CM | POA: Diagnosis not present

## 2021-08-22 DIAGNOSIS — Z1211 Encounter for screening for malignant neoplasm of colon: Secondary | ICD-10-CM | POA: Diagnosis not present

## 2021-08-22 DIAGNOSIS — D124 Benign neoplasm of descending colon: Secondary | ICD-10-CM | POA: Insufficient documentation

## 2021-08-22 DIAGNOSIS — E1122 Type 2 diabetes mellitus with diabetic chronic kidney disease: Secondary | ICD-10-CM | POA: Insufficient documentation

## 2021-08-22 DIAGNOSIS — E11319 Type 2 diabetes mellitus with unspecified diabetic retinopathy without macular edema: Secondary | ICD-10-CM | POA: Insufficient documentation

## 2021-08-22 DIAGNOSIS — Z86010 Personal history of colonic polyps: Secondary | ICD-10-CM | POA: Diagnosis not present

## 2021-08-22 DIAGNOSIS — Z87891 Personal history of nicotine dependence: Secondary | ICD-10-CM | POA: Diagnosis not present

## 2021-08-22 DIAGNOSIS — K219 Gastro-esophageal reflux disease without esophagitis: Secondary | ICD-10-CM | POA: Diagnosis not present

## 2021-08-22 HISTORY — PX: COLONOSCOPY: SHX5424

## 2021-08-22 LAB — GLUCOSE, CAPILLARY: Glucose-Capillary: 112 mg/dL — ABNORMAL HIGH (ref 70–99)

## 2021-08-22 SURGERY — COLONOSCOPY
Anesthesia: General

## 2021-08-22 MED ORDER — SODIUM CHLORIDE 0.9 % IV SOLN: INTRAVENOUS | Status: DC

## 2021-08-22 MED ORDER — PROPOFOL 10 MG/ML IV BOLUS
INTRAVENOUS | Status: DC | PRN
  Administered 2021-08-22 (×4): 30 mg via INTRAVENOUS
  Administered 2021-08-22: 80 mg via INTRAVENOUS

## 2021-08-22 NOTE — Transfer of Care (Signed)
Immediate Anesthesia Transfer of Care Note  Patient: Michael Humphrey  Procedure(s) Performed: COLONOSCOPY  Patient Location: Endoscopy Unit  Anesthesia Type:General  Level of Consciousness: drowsy  Airway & Oxygen Therapy: Patient Spontanous Breathing and Patient connected to nasal cannula oxygen  Post-op Assessment: Report given to RN and Post -op Vital signs reviewed and stable  Post vital signs: Reviewed and stable  Last Vitals:  Vitals Value Taken Time  BP 134/79 08/22/21 1023  Temp    Pulse 77 08/22/21 1024  Resp    SpO2 92 % 08/22/21 1024  Vitals shown include unvalidated device data.  Last Pain:  Vitals:   08/22/21 0901  TempSrc: Temporal  PainSc: 0-No pain         Complications: No notable events documented.

## 2021-08-22 NOTE — Anesthesia Postprocedure Evaluation (Signed)
Anesthesia Post Note  Patient: Kanen Mottola  Procedure(s) Performed: COLONOSCOPY  Patient location during evaluation: PACU Anesthesia Type: General Level of consciousness: awake and alert, oriented and patient cooperative Pain management: pain level controlled Vital Signs Assessment: post-procedure vital signs reviewed and stable Respiratory status: spontaneous breathing, nonlabored ventilation and respiratory function stable Cardiovascular status: blood pressure returned to baseline and stable Postop Assessment: adequate PO intake Anesthetic complications: no   No notable events documented.   Last Vitals:  Vitals:   08/22/21 1033 08/22/21 1035  BP: (!) 146/83   Pulse: 65 64  Resp:    Temp:    SpO2: 99% 97%    Last Pain:  Vitals:   08/22/21 1023  TempSrc: Temporal  PainSc:                  Darrin Nipper

## 2021-08-22 NOTE — H&P (Signed)
Lucilla Lame, MD [address] [place], [place] [address] Phone:[phone] Fax : [phone]  Primary Care Physician:  Kirk Ruths, MD Primary Gastroenterologist:  Dr. Allen Norris  Pre-Procedure History & Physical: HPI:  Michael Humphrey is a 71 y.o. male is here for an colonoscopy.   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 Orion Modest recently and sent here   Stroke [facility])    07/2017   Wears dentures    full upper and lower    Past Surgical History:  Procedure Laterality Date   AMPUTATION TOE Left 01/15/2020   Procedure: AMPUTATION TOE MPJ T1;  Surgeon: Samara Deist, DPM;  Location: [facility] ORS;  Service: Podiatry;  Laterality: Left;   BUNIONECTOMY     CATARACT EXTRACTION W/PHACO Left 12/11/2017   Procedure: CATARACT EXTRACTION PHACO AND INTRAOCULAR LENS PLACEMENT ([place])  LEFT DIABETIC;  Surgeon: Leandrew Koyanagi, MD;  Location: [place];  Service: Ophthalmology;  Laterality: Left;  DIABETIC (ACTA picking pt up at 0600, needs 0630 arrival time)   CATARACT EXTRACTION W/PHACO Right 01/15/2018   Procedure: CATARACT EXTRACTION PHACO AND INTRAOCULAR LENS PLACEMENT ([place])  RIGHT DIABETIC  leave so arrival will be around 7:45 ds;  Surgeon: Leandrew Koyanagi, MD;  Location: [place];  Service: Ophthalmology;  Laterality: Right;  Diabetic - oral meds   CHOLECYSTECTOMY     COLONOSCOPY     COLONOSCOPY WITH PROPOFOL N/A 12/17/2014   Procedure: COLONOSCOPY WITH  PROPOFOL;  Surgeon: Lollie Sails, MD;  Location: [facility] ENDOSCOPY;  Service: Endoscopy;  Laterality: N/A;   correct hammertoe     FASCIECTOMY Right 10/29/2019   Procedure: PLANTAR FIBROMA RESECTION RIGHT;  Surgeon: Caroline More, DPM;  Location: [place];  Service: Podiatry;  Laterality: Right;  Diabetic - oral meds   JOINT REPLACEMENT     KNEE ARTHROSCOPY Right    LEFT HEART CATH AND CORONARY ANGIOGRAPHY N/A 06/08/2021   Procedure: LEFT HEART CATH AND CORONARY ANGIOGRAPHY;  Surgeon: Isaias Cowman, MD;  Location: [place] CV LAB;  Service: Cardiovascular;  Laterality: N/A;   LUMBAR LAMINECTOMY/ DECOMPRESSION WITH MET-RX N/A 02/18/2019   Procedure: L4-5 DECOMPRESSION;  Surgeon: Meade Maw, MD;  Location: [facility] ORS;  Service: Neurosurgery;  Laterality: N/A;   TOTAL HIP ARTHROPLASTY Bilateral    TOTAL KNEE ARTHROPLASTY Bilateral     Prior to Admission medications   Medication Sig Start Date End Date Taking? Authorizing Provider  aspirin EC 81 MG tablet Take 81 mg by mouth daily. Swallow whole.   Yes [provider]  carvedilol (COREG) 12.5 MG tablet Take 12.5 mg by mouth 2 (two) times daily.  12/04/17  Yes [provider]  finasteride (PROSCAR) 5 MG tablet Take 5 mg by mouth every morning.    Yes [provider]  gabapentin (NEURONTIN) 600 MG tablet Take 600 mg by mouth 2 (two) times daily.   Yes [provider]  glimepiride (AMARYL) 2 MG tablet Take  2 mg by mouth daily with breakfast.   Yes [provider]  lisinopril (PRINIVIL,ZESTRIL) 2.5 MG tablet Take 1 tablet (2.5 mg total) by mouth daily. Patient taking differently: Take 2.5 mg by mouth every morning. 02/28/18  Yes Sainani, Belia Heman, MD  metFORMIN (GLUCOPHAGE) 500 MG tablet Take 500 mg by mouth 2 (two) times daily.   Yes [provider]  methocarbamol (ROBAXIN) 500 MG tablet Take 1 tablet (500 mg total) by mouth every 8 (eight) hours as needed for muscle  spasms. 07/16/20  Yes Vladimir Crofts, MD  torsemide (DEMADEX) 20 MG tablet Take 20 mg by mouth daily as needed (swelling).   Yes [provider]  traZODone (DESYREL) 100 MG tablet Take 1 tablet (100 mg total) by mouth at bedtime as needed for sleep. 07/17/17 10/22/19  Saundra Shelling, MD    Allergies as of 06/23/2021 - Review Complete 06/08/2021  Allergen Reaction Noted   Oxycodone  02/26/2018   Clonidine derivatives  12/16/2014   Fioricet [butalbital-apap-caffeine]  12/16/2014   Hydrocodone-acetaminophen  12/16/2014   Mobic [meloxicam]  12/16/2014   Norvasc [amlodipine besylate]  12/16/2014   Statins  12/16/2014   Tramadol Itching 12/04/2016    Family History  Problem Relation Age of Onset   Clotting disorder Father    Hypertension Father     Social History   Socioeconomic History   Marital status: Married    Spouse name: Not on file   Number of children: Not on file   Years of education: Not on file   Highest education level: Not on file  Occupational History   Not on file  Tobacco Use   Smoking status: Never   Smokeless tobacco: Former    Types: Chew    Quit date: 02/08/1978  Vaping Use   Vaping Use: Never used  Substance and Sexual Activity   Alcohol use: Not Currently   Drug use: No   Sexual activity: Not on file  Other Topics Concern   Not on file  Social History Narrative   Not on file   Social Determinants of Health   Financial Resource Strain: Not on file  Food Insecurity: Not on file  Transportation Needs: Not on file  Physical Activity: Not on file  Stress: Not on file  Social Connections: Not on file  Intimate Partner Violence: Not on file    Review of Systems: See HPI, otherwise negative ROS  Physical Exam: BP (!) 182/106   Pulse 79   Temp (!) 96.6 F (35.9 C) (Temporal)   Resp 20   SpO2 100%  General:   Alert,  pleasant and cooperative in NAD Head:  Normocephalic and atraumatic. Neck:  Supple; no masses or thyromegaly. Lungs:   Clear throughout to auscultation.    Heart:  Regular rate and rhythm. Abdomen:  Soft, nontender and nondistended. Normal bowel sounds, without guarding, and without rebound.   Neurologic:  Alert and  oriented x4;  grossly normal neurologically.  Impression/Plan: Michael Humphrey is here for an colonoscopy to be performed for rectal bleeding  Risks, benefits, limitations, and alternatives regarding  colonoscopy have been reviewed with the patient.  Questions have been answered.  All parties agreeable.   Lucilla Lame, MD  08/22/2021, 9:57 AM

## 2021-08-22 NOTE — Op Note (Signed)
[facility] Gastroenterology Patient Name: Michael Humphrey Procedure Date: 08/22/2021 9:55 AM MRN: [id number] Account #: [id number] Date of Birth: 05-24-1950 Admit Type: Outpatient Age: 71 Room: [facility] ENDO ROOM 4 Gender: Male Note Status: Finalized Instrument Name: Jasper Riling [id number] Procedure:             Colonoscopy Indications:           High risk colon cancer surveillance: Personal history                         of colonic polyps Providers:             Lucilla Lame MD, MD Referring MD:          Ocie Cornfield. Ouida Sills MD, MD (Referring MD) Medicines:             Propofol per Anesthesia Complications:         No immediate complications. Procedure:             Pre-Anesthesia Assessment:                        - Prior to the procedure, a History and Physical was                         performed, and patient medications and allergies were                         reviewed. The patient's tolerance of previous                         anesthesia was also reviewed. The risks and benefits                         of the procedure and the sedation options and risks                         were discussed with the patient. All questions were                         answered, and informed consent was obtained. Prior                         Anticoagulants: The patient has taken no previous                         anticoagulant or antiplatelet agents. ASA Grade                         Assessment: II - A patient with mild systemic disease.                         After reviewing the risks and benefits, the patient                         was deemed in satisfactory condition to undergo the                         procedure.  After obtaining informed consent, the colonoscope was                         passed under direct vision. Throughout the procedure,                         the patient's blood pressure, pulse, and oxygen                         saturations  were monitored continuously. The                         Colonoscope was introduced through the anus and                         advanced to the the cecum, identified by appendiceal                         orifice and ileocecal valve. The colonoscopy was                         performed without difficulty. The patient tolerated                         the procedure well. The quality of the bowel                         preparation was adequate to identify polyps. Findings:      The perianal and digital rectal examinations were normal.      A 5 mm polyp was found in the descending colon. The polyp was sessile.       The polyp was removed with a cold snare. Resection and retrieval were       complete.      Non-bleeding internal hemorrhoids were found during retroflexion. The       hemorrhoids were Grade I (internal hemorrhoids that do not prolapse). Impression:            - One 5 mm polyp in the descending colon, removed with                         a cold snare. Resected and retrieved.                        - Non-bleeding internal hemorrhoids. Recommendation:        - Discharge patient to home.                        - High fiber diet.                        - Continue present medications.                        - Await pathology results.                        - Repeat colonoscopy in 5 years for surveillance. Procedure Code(s):     --- Professional ---  45385, Colonoscopy, flexible; with removal of                         tumor(s), polyp(s), or other lesion(s) by snare                         technique Diagnosis Code(s):     --- Professional ---                        Z86.010, Personal history of colonic polyps                        K63.5, Polyp of colon CPT copyright 2019 American [employer]. All rights reserved. The codes documented in this report are preliminary and upon coder review may  be revised to meet current compliance requirements. Lucilla Lame MD, MD 08/22/2021 10:21:31 AM This report has been signed electronically. Number of Addenda: 0 Note Initiated On: 08/22/2021 9:55 AM Scope Withdrawal Time: 0 hours 9 minutes 40 seconds  Total Procedure Duration: 0 hours 11 minutes 49 seconds  Estimated Blood Loss:  Estimated blood loss: none.      [facility]

## 2021-08-22 NOTE — Anesthesia Preprocedure Evaluation (Addendum)
Anesthesia Evaluation  Patient identified by MRN, date of birth, ID band Patient awake    Reviewed: Allergy & Precautions, NPO status , Patient's Chart, lab work & pertinent test results  History of Anesthesia Complications Negative for: history of anesthetic complications  Airway Mallampati: III   Neck ROM: Full    Dental  (+) Upper Dentures, Lower Dentures   Pulmonary former smoker (quit 1980),    Pulmonary exam normal breath sounds clear to auscultation       Cardiovascular hypertension, Normal cardiovascular exam Rhythm:Regular Rate:Normal  Hx DVT  ECG 06/08/21:  Sinus rhythm Incomplete left bundle branch block Consider anterolateral infarct Baseline wander in lead(s) V3 V6  Myocardial perfusion 05/17/21:  1. Normal left ventricular function  2. Normal wall motion  3. Mild to moderate inferoapical and septal ischemia   Neuro/Psych Diabetic retinopathy CVA (07/2017)    GI/Hepatic GERD  ,  Endo/Other  diabetes, Type 2Obesity   Renal/GU Renal disease (stage III CKD)     Musculoskeletal  (+) Arthritis ,   Abdominal   Peds  Hematology negative hematology ROS (+)   Anesthesia Other Findings Cardiology note 06/21/21:  71 y.o. male with  1. HTN (hypertension), malignant  2. Chest pain with high risk for cardiac etiology  3. History of pulmonary embolus (PE)  4. OSA (obstructive sleep apnea)  5. Type 2 DM with CKD stage 3 and hypertension (CMS-HCC)  6. Pure hypercholesterolemia  7. Carotid artery disease, unspecified laterality, unspecified type (CMS-HCC)  8. Cerebrovascular disease  9. Myalgia due to statin  10. S/P cardiac catheterization   71 year old male with multiple cardiovascular risk factors, history of chest pain with atypical features, and Lexiscan Myoview which revealed mildly left ventricular function with inferoseptal scar without significant ischemia. The patient returned with chief complaint  of exertional chest pain and shortness of breath. Patient unable to tolerate nitrates. The patient has essential hypertension, blood pressure well controlled on current BP medications. Repeat Lexiscan Myoview 05/17/2021 revealed LVEF 51% with mild to moderate inferoapical and septal ischemia. The patient underwent cardiac catheterization on 06/08/2021 which revealed chronically occluded mid LAD, 75% stenosis mid to distal PDA, and 75% stenosis PL 2 felt best to be treated medically.   Plan   1. Continue current medications 2. Counseled patient about low-sodium diet 3. DASH diet printed instructions given to the patient 4. Counseled patient about low-cholesterol diet 5. Low-fat and cholesterol diet printed instructions given to the patient 6. Start rosuvastatin 5 mg daily 7. Return to clinic for follow-up in 3 months  No orders of the defined types were placed in this encounter.  Return in about 3 months (around 09/21/2021).   Reproductive/Obstetrics                            Anesthesia Physical Anesthesia Plan  ASA: 2  Anesthesia Plan: General   Post-op Pain Management:    Induction: Intravenous  PONV Risk Score and Plan: 2 and Propofol infusion, TIVA and Treatment may vary due to age or medical condition  Airway Management Planned: Natural Airway  Additional Equipment:   Intra-op Plan:   Post-operative Plan:   Informed Consent: I have reviewed the patients History and Physical, chart, labs and discussed the procedure including the risks, benefits and alternatives for the proposed anesthesia with the patient or authorized representative who has indicated his/her understanding and acceptance.       Plan Discussed with: CRNA  Anesthesia Plan Comments: (  LMA/GETA backup discussed.  Patient consented for risks of anesthesia including but not limited to:  - adverse reactions to medications - damage to eyes, teeth, lips or other oral mucosa - nerve  damage due to positioning  - sore throat or hoarseness - damage to heart, brain, nerves, lungs, other parts of body or loss of life  Informed patient about role of CRNA in peri- and intra-operative care.  Patient voiced understanding.)        Anesthesia Quick Evaluation

## 2021-08-23 ENCOUNTER — Encounter: Payer: Self-pay | Admitting: Gastroenterology

## 2021-08-23 LAB — SURGICAL PATHOLOGY

## 2021-08-29 ENCOUNTER — Encounter: Payer: Self-pay | Admitting: Gastroenterology

## 2021-09-06 DIAGNOSIS — E113311 Type 2 diabetes mellitus with moderate nonproliferative diabetic retinopathy with macular edema, right eye: Secondary | ICD-10-CM | POA: Diagnosis not present

## 2021-09-19 DIAGNOSIS — Z23 Encounter for immunization: Secondary | ICD-10-CM | POA: Diagnosis not present

## 2021-09-19 DIAGNOSIS — I679 Cerebrovascular disease, unspecified: Secondary | ICD-10-CM | POA: Diagnosis not present

## 2021-09-19 DIAGNOSIS — I10 Essential (primary) hypertension: Secondary | ICD-10-CM | POA: Diagnosis not present

## 2021-09-19 DIAGNOSIS — G4733 Obstructive sleep apnea (adult) (pediatric): Secondary | ICD-10-CM | POA: Diagnosis not present

## 2021-09-19 DIAGNOSIS — I2583 Coronary atherosclerosis due to lipid rich plaque: Secondary | ICD-10-CM | POA: Diagnosis not present

## 2021-09-19 DIAGNOSIS — R600 Localized edema: Secondary | ICD-10-CM | POA: Diagnosis not present

## 2021-09-19 DIAGNOSIS — I779 Disorder of arteries and arterioles, unspecified: Secondary | ICD-10-CM | POA: Diagnosis not present

## 2021-09-19 DIAGNOSIS — I2510 Atherosclerotic heart disease of native coronary artery without angina pectoris: Secondary | ICD-10-CM | POA: Diagnosis not present

## 2021-09-19 DIAGNOSIS — Z86711 Personal history of pulmonary embolism: Secondary | ICD-10-CM | POA: Diagnosis not present

## 2021-09-19 DIAGNOSIS — I129 Hypertensive chronic kidney disease with stage 1 through stage 4 chronic kidney disease, or unspecified chronic kidney disease: Secondary | ICD-10-CM | POA: Diagnosis not present

## 2021-09-19 DIAGNOSIS — N183 Chronic kidney disease, stage 3 unspecified: Secondary | ICD-10-CM | POA: Diagnosis not present

## 2021-09-19 DIAGNOSIS — E1122 Type 2 diabetes mellitus with diabetic chronic kidney disease: Secondary | ICD-10-CM | POA: Diagnosis not present

## 2021-10-03 DIAGNOSIS — W010XXA Fall on same level from slipping, tripping and stumbling without subsequent striking against object, initial encounter: Secondary | ICD-10-CM | POA: Diagnosis not present

## 2021-10-03 DIAGNOSIS — M4312 Spondylolisthesis, cervical region: Secondary | ICD-10-CM | POA: Diagnosis not present

## 2021-10-03 DIAGNOSIS — M542 Cervicalgia: Secondary | ICD-10-CM | POA: Diagnosis not present

## 2021-10-03 DIAGNOSIS — M25551 Pain in right hip: Secondary | ICD-10-CM | POA: Diagnosis not present

## 2021-10-03 DIAGNOSIS — M47812 Spondylosis without myelopathy or radiculopathy, cervical region: Secondary | ICD-10-CM | POA: Diagnosis not present

## 2021-10-03 DIAGNOSIS — M25561 Pain in right knee: Secondary | ICD-10-CM | POA: Diagnosis not present

## 2021-10-03 DIAGNOSIS — Y92009 Unspecified place in unspecified non-institutional (private) residence as the place of occurrence of the external cause: Secondary | ICD-10-CM | POA: Diagnosis not present

## 2021-10-03 DIAGNOSIS — M25511 Pain in right shoulder: Secondary | ICD-10-CM | POA: Diagnosis not present

## 2021-10-05 DIAGNOSIS — Z85828 Personal history of other malignant neoplasm of skin: Secondary | ICD-10-CM | POA: Diagnosis not present

## 2021-10-05 DIAGNOSIS — D1801 Hemangioma of skin and subcutaneous tissue: Secondary | ICD-10-CM | POA: Diagnosis not present

## 2021-10-05 DIAGNOSIS — L570 Actinic keratosis: Secondary | ICD-10-CM | POA: Diagnosis not present

## 2021-10-05 DIAGNOSIS — D225 Melanocytic nevi of trunk: Secondary | ICD-10-CM | POA: Diagnosis not present

## 2021-10-05 DIAGNOSIS — L578 Other skin changes due to chronic exposure to nonionizing radiation: Secondary | ICD-10-CM | POA: Diagnosis not present

## 2021-10-05 DIAGNOSIS — L821 Other seborrheic keratosis: Secondary | ICD-10-CM | POA: Diagnosis not present

## 2021-10-05 DIAGNOSIS — D2260 Melanocytic nevi of unspecified upper limb, including shoulder: Secondary | ICD-10-CM | POA: Diagnosis not present

## 2021-10-05 DIAGNOSIS — Z1283 Encounter for screening for malignant neoplasm of skin: Secondary | ICD-10-CM | POA: Diagnosis not present

## 2021-10-05 DIAGNOSIS — D2270 Melanocytic nevi of unspecified lower limb, including hip: Secondary | ICD-10-CM | POA: Diagnosis not present

## 2021-10-05 DIAGNOSIS — L814 Other melanin hyperpigmentation: Secondary | ICD-10-CM | POA: Diagnosis not present

## 2021-10-10 DIAGNOSIS — Z96651 Presence of right artificial knee joint: Secondary | ICD-10-CM | POA: Diagnosis not present

## 2021-10-10 DIAGNOSIS — Z9889 Other specified postprocedural states: Secondary | ICD-10-CM | POA: Diagnosis not present

## 2021-10-10 DIAGNOSIS — Z96641 Presence of right artificial hip joint: Secondary | ICD-10-CM | POA: Diagnosis not present

## 2021-10-10 DIAGNOSIS — Z471 Aftercare following joint replacement surgery: Secondary | ICD-10-CM | POA: Diagnosis not present

## 2021-10-10 DIAGNOSIS — M25561 Pain in right knee: Secondary | ICD-10-CM | POA: Diagnosis not present

## 2021-10-10 DIAGNOSIS — M25551 Pain in right hip: Secondary | ICD-10-CM | POA: Diagnosis not present

## 2021-10-30 ENCOUNTER — Encounter (INDEPENDENT_AMBULATORY_CARE_PROVIDER_SITE_OTHER): Payer: Self-pay

## 2021-10-30 DIAGNOSIS — M25511 Pain in right shoulder: Secondary | ICD-10-CM | POA: Diagnosis not present

## 2021-11-01 DIAGNOSIS — E113412 Type 2 diabetes mellitus with severe nonproliferative diabetic retinopathy with macular edema, left eye: Secondary | ICD-10-CM | POA: Diagnosis not present

## 2021-11-01 DIAGNOSIS — E113411 Type 2 diabetes mellitus with severe nonproliferative diabetic retinopathy with macular edema, right eye: Secondary | ICD-10-CM | POA: Diagnosis not present

## 2021-11-01 DIAGNOSIS — H33321 Round hole, right eye: Secondary | ICD-10-CM | POA: Diagnosis not present

## 2021-11-01 DIAGNOSIS — H43811 Vitreous degeneration, right eye: Secondary | ICD-10-CM | POA: Diagnosis not present

## 2021-11-07 DIAGNOSIS — M25511 Pain in right shoulder: Secondary | ICD-10-CM | POA: Diagnosis not present

## 2021-11-08 ENCOUNTER — Other Ambulatory Visit: Payer: Self-pay | Admitting: Orthopedic Surgery

## 2021-11-08 DIAGNOSIS — M75121 Complete rotator cuff tear or rupture of right shoulder, not specified as traumatic: Secondary | ICD-10-CM | POA: Diagnosis not present

## 2021-11-08 DIAGNOSIS — E113411 Type 2 diabetes mellitus with severe nonproliferative diabetic retinopathy with macular edema, right eye: Secondary | ICD-10-CM | POA: Diagnosis not present

## 2021-11-14 ENCOUNTER — Encounter
Admission: RE | Admit: 2021-11-14 | Discharge: 2021-11-14 | Disposition: A | Discharge status: Home / Self Care | Payer: PPO | Source: Ambulatory Visit | Attending: Orthopedic Surgery | Admitting: Orthopedic Surgery

## 2021-11-14 ENCOUNTER — Encounter: Payer: Self-pay | Admitting: Urgent Care

## 2021-11-14 ENCOUNTER — Encounter: Payer: Self-pay | Admitting: Orthopedic Surgery

## 2021-11-14 DIAGNOSIS — Z01812 Encounter for preprocedural laboratory examination: Secondary | ICD-10-CM | POA: Diagnosis not present

## 2021-11-14 DIAGNOSIS — R310 Gross hematuria: Secondary | ICD-10-CM

## 2021-11-14 LAB — BASIC METABOLIC PANEL
Anion gap: 7 (ref 5–15)
BUN: 20 mg/dL (ref 8–23)
CO2: 24 mmol/L (ref 22–32)
Calcium: 9.4 mg/dL (ref 8.9–10.3)
Chloride: 107 mmol/L (ref 98–111)
Creatinine, Ser: 1.04 mg/dL (ref 0.61–1.24)
GFR, Estimated: >60 mL/min (ref >60)
Glucose, Bld: 125 mg/dL — ABNORMAL HIGH (ref 70–99)
Potassium: 3.6 mmol/L (ref 3.5–5.1)
Sodium: 138 mmol/L (ref 135–145)

## 2021-11-14 LAB — CBC
HCT: 41.2 % (ref 39.0–52.0)
Hemoglobin: 14.2 g/dL (ref 13.0–17.0)
MCH: 31.3 pg (ref 26.0–34.0)
MCHC: 34.5 g/dL (ref 30.0–36.0)
MCV: 90.7 fL (ref 80.0–100.0)
Platelets: 150 10*3/uL (ref 150–400)
RBC: 4.54 MIL/uL (ref 4.22–5.81)
RDW: 13.3 % (ref 11.5–15.5)
WBC: 5.5 10*3/uL (ref 4.0–10.5)
nRBC: 0 % (ref 0.0–0.2)

## 2021-11-14 LAB — URINALYSIS, COMPLETE (UACMP) WITH MICROSCOPIC
Bacteria, UA: NONE SEEN
Bilirubin Urine: NEGATIVE
Glucose, UA: NEGATIVE mg/dL
Hgb urine dipstick: NEGATIVE
Ketones, ur: NEGATIVE mg/dL
Leukocytes,Ua: NEGATIVE
Nitrite: NEGATIVE
Protein, ur: NEGATIVE mg/dL
Specific Gravity, Urine: 1.015 (ref 1.005–1.030)
pH: 5 (ref 5.0–8.0)

## 2021-11-14 NOTE — Patient Instructions (Addendum)
Your procedure is scheduled on: 11/16/21 -  Thursday Report to the Registration Desk on the 1st floor of the [facility]. To find out your arrival time, please call [phone] between 1PM - 3PM on: 11/15/21 - Wednesday If your arrival time is 6:00 am, do not arrive prior to that time as the [facility] entrance doors do not open until 6:00 am.  REMEMBER: Instructions that are not followed completely may result in serious medical risk, up to and including death; or upon the discretion of your surgeon and anesthesiologist your surgery may need to be rescheduled.  Do not eat food or drink any liquids after midnight the night before surgery.  No gum chewing, lozengers or hard candies.  TAKE THESE MEDICATIONS THE MORNING OF SURGERY WITH A SIP OF WATER:  - carvedilol (COREG)  - gabapentin (NEURONTIN)  - oxyCODONE (OXY IR/ROXICODONE) if needed.  HOLD metFORMIN (GLUCOPHAGE ) beginning 11/14/21.  ASPIRIN stopped 11/09/21 per patient.   One week prior to surgery: Stop Anti-inflammatories (NSAIDS) such as Advil, Aleve, Ibuprofen, Motrin, Naproxen, Naprosyn and Aspirin based products such as Excedrin, Goodys Powder, BC Powder.  Stop ANY OVER THE COUNTER supplements until after surgery.  You may however, continue to take Tylenol if needed for pain up until the day of surgery.  No Alcohol for 24 hours before or after surgery.  No Smoking including e-cigarettes for 24 hours prior to surgery.  No chewable tobacco products for at least 6 hours prior to surgery.  No nicotine patches on the day of surgery.  Do not use any "recreational" drugs for at least a week prior to your surgery.  Please be advised that the combination of cocaine and anesthesia may have negative outcomes, up to and including death. If you test positive for cocaine, your surgery will be cancelled.  On the morning of surgery brush your teeth with toothpaste and water, you may rinse your mouth with mouthwash if you  wish. Do not swallow any toothpaste or mouthwash.  Use CHG Soap or wipes as directed on instruction sheet.  Do not wear jewelry, make-up, hairpins, clips or nail polish.  Do not wear lotions, powders, or perfumes.   Do not shave body from the neck down 48 hours prior to surgery just in case you cut yourself which could leave a site for infection.  Also, freshly shaved skin may become irritated if using the CHG soap.  Contact lenses, hearing aids and dentures may not be worn into surgery.  Do not bring valuables to the hospital. [facility] is not responsible for any missing/lost belongings or valuables.   Notify your doctor if there is any change in your medical condition (cold, fever, infection).  Wear comfortable clothing (specific to your surgery type) to the hospital.  After surgery, you can help prevent lung complications by doing breathing exercises.  Take deep breaths and cough every 1-2 hours. Your doctor may order a device called an Incentive Spirometer to help you take deep breaths. When coughing or sneezing, hold a pillow firmly against your incision with both hands. This is called "splinting." Doing this helps protect your incision. It also decreases belly discomfort.  If you are being admitted to the hospital overnight, leave your suitcase in the car. After surgery it may be brought to your room.  If you are being discharged the day of surgery, you will not be allowed to drive home. You will need a responsible adult (18 years or older) to drive you home and  stay with you that night.   If you are taking public transportation, you will need to have a responsible adult (18 years or older) with you. Please confirm with your physician that it is acceptable to use public transportation.   Please call the [facility] Dept. at [phone] if you have any questions about these instructions.  Surgery Visitation Policy:  Patients undergoing a surgery or procedure  may have two family members or support persons with them as long as the person is not COVID-19 positive or experiencing its symptoms.   Inpatient Visitation:    Visiting hours are 7 a.m. to 8 p.m. Up to four visitors are allowed at one time in a patient room. The visitors may rotate out with other people during the day. One designated support person (adult) may remain overnight.  MASKING: Due to an increase in RSV rates and hospitalizations, starting Wednesday, Nov. 15, in patient care areas in which we serve newborns, infants and children, masks will be required for teammates and visitors.  Children ages 58 and under may not visit. This policy affects the following departments only:  [facility] area Mother Baby Unit Newborn nursery/Special care nursery  Other areas: Masks continue to be strongly recommended for [facility] teammates, visitors and patients in all other areas. Visitation is not restricted outside of the units listed above.

## 2021-11-14 NOTE — Progress Notes (Signed)
Perioperative Services  Pre-Admission/Anesthesia Testing Clinical Review  Date: 11/14/21  Patient Demographics:  Name: Michael Humphrey DOB:   10/22/1950 MRN:   [id number]  Planned Surgical Procedure(s):    Case: [id number] Date/Time: 11/16/21 0730   Procedure: SHOULDER ARTHROSCOPY WITH OPEN ROTATOR CUFF REPAIR AND DISTAL CLAVICLE ACROMIONECTOMY (Right)   Anesthesia type: Choice   Pre-op diagnosis: Right Shoulder Rotator Cuff Tear   Location: [place] OR ROOM 02 / [place] [facility]S FOR ANESTHESIA GROUP   Surgeons: Thornton Park, MD   NOTE: Available PAT nursing documentation and vital signs have been reviewed. Clinical nursing staff has updated patient's PMH/PSHx, current medication list, and drug allergies/intolerances to ensure comprehensive history available to assist in medical decision making as it pertains to the aforementioned surgical procedure and anticipated anesthetic course. Extensive review of available clinical information performed. [place] PMH and PSHx updated with any diagnoses/procedures that  may have been inadvertently omitted during his intake with the pre-admission testing department's nursing staff.  Clinical Discussion:  Michael Humphrey is a 71 y.o. male who is submitted for pre-surgical anesthesia review and clearance prior to him undergoing the above procedure. Patient has never been a smoker. Pertinent PMH includes: CAD, carotid artery disease, CVA, DVT/PE, IRBBB, HTN, HLD, T2DM, GERD (no daily Tx), OSAH (unable to tolerate nocturnal PAP therapy), lumbar radiculitis (s/p L4-L5 decompression), OA/DISH.  Patient is followed by cardiology Saralyn Pilar, MD). He was last seen in the cardiology clinic on 09/19/2021; notes reviewed.  At the time of his clinic visit, patient reported to be doing well overall from a cardiovascular perspective.  He denied any episodes of chest pain, shortness breath, PND, orthopnea, palpitations, significant peripheral edema, vertiginous  symptoms, or presyncope/syncope.  Patient with a past medical history significant for cardiovascular diagnoses.  Diagnostic LEFT heart catheterization performed on 08/22/2012 revealed no evidence of obstructive CAD.  TTE performed on 02/27/2018 revealed a low normal left ventricular systolic function with an EF of 50-55%.  Diastolic Doppler parameters were normal.  Right ventricular size and function normal.  There was trivial pericardial effusion present.  There was no evidence of significant valvular regurgitation.  There were no significant transvalvular gradients to suggest stenosis.  Myocardial perfusion imaging study performed on 05/17/2021 revealed a low normal left ventricular systolic function with an EF of 51%  There was normal regional wall motion.  There was evidence of a small reversible perfusion abnormality of mild intensity present in the inferoapical and septal region on stress images consistent with ischemia.  Repeat diagnostic LEFT heart catheterization performed on 06/08/2021 revealing a normal left ventricular systolic function with an EF of 55-65%.  There was multivessel CAD; 30% ostial LM, 100% proximal to mid LAD, 50% ostial to proximal LAD, 60% proximal LAD, 30% proximal RCA, 75% RPDA, and 75% RPAV.  Cardiology noted that mid LAD, mid to distal PDA, and PL 2 were all chronically occluded.  The decision was made to defer further intervention and opted for aggressive medical management.  Blood pressure mildly elevated at 140/80 on currently prescribed beta-blocker (carvedilol), ACEi (lisinopril), and diuretic (torsemide) therapies.  Patient is statin intolerant and is not currently taking any other types of lipid-lowering therapies for his HLD diagnosis and ASCVD prevention.  T2DM well-controlled on currently prescribed regimen; last HgbA1c was 6.8% when checked on 06/19/2021.  Patient does have an OSAH diagnosis, however he is unable to tolerate the prescribed nocturnal PAP therapy.   Patient maintains an active lifestyle as per baseline.  He is able to do  yard work without limitation.  Therefore, per the DASI, patient is able to achieve >4 METS of physical activity without experiencing any degree of angina/anginal equivalent symptoms.  No changes were made to his medication regimen.  Patient to follow-up with outpatient cardiology in 4 months or sooner if needed.  Michael Humphrey is scheduled for an elective RIGHT SHOULDER ARTHROSCOPY WITH OPEN ROTATOR CUFF REPAIR AND DISTAL CLAVICLE ACROMIONECTOMY on 11/16/2021 with Dr. Thornton Park, MD.  Given patient's past medical history significant for cardiovascular diagnoses, presurgical cardiac clearance was sought by the PAT team. ***.  In review of his medication reconciliation, it is noted that patient is currently on prescribed daily antiplatelet therapy. He has been instructed on recommendations for holding his daily low-dose ASA for 7 days prior to his procedure with plans to restart as soon as postoperative bleeding risk felt to be minimized by his attending surgeon. The patient has been instructed that his last dose of his ASA will be on 11/09/2021.  Patient denies previous perioperative complications with anesthesia in the past. In review of the available records, it is noted that patient underwent a general anesthetic course here at [facility] ([facility]) in 08/2021 without documented complications.      08/22/2021   10:35 AM 08/22/2021   10:33 AM 08/22/2021   10:23 AM  Vitals with BMI  Systolic  654 650  Diastolic  83 79  Pulse 64 65 71    Providers/Specialists:   NOTE: Primary physician provider listed below. Patient may have been seen by APP or partner within same practice.   PROVIDER ROLE / SPECIALTY LAST Larey Seat, MD Orthopedics (Surgeon) ???  Kirk Ruths, MD Primary Care Provider 10/03/2021  Isaias Cowman, MD Cardiology 09/19/2021   Allergies:   Oxycodone, Clonidine derivatives, Fioricet [butalbital-apap-caffeine], Hydrocodone-acetaminophen, Mobic [meloxicam], Norvasc [amlodipine besylate], Statins, and Tramadol  Current Home Medications:   No current facility-administered medications for this encounter.    acetaminophen (TYLENOL) 500 MG tablet   aspirin EC 81 MG tablet   carvedilol (COREG) 12.5 MG tablet   finasteride (PROSCAR) 5 MG tablet   gabapentin (NEURONTIN) 600 MG tablet   glimepiride (AMARYL) 2 MG tablet   lisinopril (PRINIVIL,ZESTRIL) 2.5 MG tablet   metFORMIN (GLUCOPHAGE) 500 MG tablet   methocarbamol (ROBAXIN) 500 MG tablet   oxyCODONE (OXY IR/ROXICODONE) 5 MG immediate release tablet   torsemide (DEMADEX) 20 MG tablet   traZODone (DESYREL) 100 MG tablet   History:   Past Medical History:  Diagnosis Date   Arthritis    CAD (coronary artery disease)    a.) LHC 08/22/2012: normal coronaries; b.) LHC 06/08/2021: EF 55-65%. 30% oLM, 100% p-mLAD, 50% o-pLAD, 60% pLAD, 30% pRCA, 75% RPDA, 75% RPAV -- med mgmt.   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   Diabetic retinopathy ([place])    DISH (diffuse idiopathic skeletal hyperostosis)    Diverticulitis    Full dentures    GERD (gastroesophageal reflux disease)    High cholesterol    Hordeolum internum right lower eyelid 12/09/2019   Hypertension    Lumbar radiculitis    a.)  L4-L5 decompression   Posterior capsular opacification, 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 Orion Modest recently and sent here   Pulmonary embolism [facility])    Skin cancer    Sleep apnea    a.) unable to tolerate nocturnal  PAP therapy   Statin intolerance    T2DM (type 2 diabetes mellitus) ([place])    Tubular adenoma of colon    Past Surgical History:  Procedure Laterality Date   AMPUTATION TOE Left 01/15/2020   Procedure: AMPUTATION TOE MPJ T1;  Surgeon: Samara Deist, DPM;  Location: [facility] ORS;  Service: Podiatry;  Laterality: Left;   BUNIONECTOMY     CATARACT EXTRACTION W/PHACO Left 12/11/2017   Procedure: CATARACT EXTRACTION PHACO AND INTRAOCULAR LENS PLACEMENT ([place])  LEFT DIABETIC;  Surgeon: Leandrew Koyanagi, MD;  Location: [place];  Service: Ophthalmology;  Laterality: Left;  DIABETIC ([facility] picking pt up at 0600, needs 0630 arrival time)   CATARACT EXTRACTION W/PHACO Right 01/15/2018   Procedure: CATARACT EXTRACTION PHACO AND INTRAOCULAR LENS PLACEMENT ([place])  RIGHT DIABETIC  leave so arrival will be around 7:45 ds;  Surgeon: Leandrew Koyanagi, MD;  Location: [place];  Service: Ophthalmology;  Laterality: Right;  Diabetic - oral meds   CHOLECYSTECTOMY     COLONOSCOPY     COLONOSCOPY N/A 08/22/2021   Procedure: COLONOSCOPY;  Surgeon: Lucilla Lame, MD;  Location: [facility] ENDOSCOPY;  Service: Endoscopy;  Laterality: N/A;   COLONOSCOPY WITH PROPOFOL N/A 12/17/2014   Procedure: COLONOSCOPY WITH PROPOFOL;  Surgeon: Lollie Sails, MD;  Location: [facility] ENDOSCOPY;  Service: Endoscopy;  Laterality: N/A;   correct hammertoe     FASCIECTOMY Right 10/29/2019   Procedure: PLANTAR FIBROMA RESECTION RIGHT;  Surgeon: Caroline More, DPM;  Location: [place];  Service: Podiatry;  Laterality: Right;  Diabetic - oral meds   JOINT REPLACEMENT     KNEE ARTHROSCOPY Right    LEFT HEART CATH AND CORONARY ANGIOGRAPHY N/A 06/08/2021   Procedure: LEFT HEART CATH AND CORONARY ANGIOGRAPHY;  Surgeon: Isaias Cowman, MD;  Location: [place] CV LAB;  Service: Cardiovascular;  Laterality: N/A;   LUMBAR LAMINECTOMY/ DECOMPRESSION WITH MET-RX N/A 02/18/2019   Procedure: L4-5 DECOMPRESSION;   Surgeon: Meade Maw, MD;  Location: [facility] ORS;  Service: Neurosurgery;  Laterality: N/A;   TOTAL HIP ARTHROPLASTY Bilateral    TOTAL KNEE ARTHROPLASTY Bilateral    Family History  Problem Relation Age of Onset   Clotting disorder Father    Hypertension Father    Social History   Tobacco Use   Smoking status: Never   Smokeless tobacco: Former    Types: Chew    Quit date: 02/08/1978  Vaping Use   Vaping Use: Never used  Substance Use Topics   Alcohol use: Not Currently   Drug use: No    Pertinent Clinical Results:  LABS: Labs reviewed: Acceptable for surgery.  Hospital Outpatient Visit on 11/14/2021  Component Date Value Ref Range Status   WBC 11/14/2021 5.5  4.0 - 10.5 K/uL Final   RBC 11/14/2021 4.54  4.22 - 5.81 MIL/uL Final   Hemoglobin 11/14/2021 14.2  13.0 - 17.0 g/dL Final   HCT 11/14/2021 41.2  39.0 - 52.0 % Final   MCV 11/14/2021 90.7  80.0 - 100.0 fL Final   MCH 11/14/2021 31.3  26.0 - 34.0 pg Final   MCHC 11/14/2021 34.5  30.0 - 36.0 g/dL Final   RDW 11/14/2021 13.3  11.5 - 15.5 % Final   Platelets 11/14/2021 150  150 - 400  K/uL Final   nRBC 11/14/2021 0.0  0.0 - 0.2 % Final   Performed at [facility], [place], [place] [address]   Sodium 11/14/2021 138  135 - 145 mmol/L Final   Potassium 11/14/2021 3.6  3.5 - 5.1 mmol/L Final   Chloride 11/14/2021 107  98 - 111 mmol/L Final   CO2 11/14/2021 24  22 - 32 mmol/L Final   Glucose, Bld 11/14/2021 125 (H)  70 - 99 mg/dL Final   Glucose reference range applies only to samples taken after fasting for at least 8 hours.   BUN 11/14/2021 20  8 - 23 mg/dL Final   Creatinine, Ser 11/14/2021 1.04  0.61 - 1.24 mg/dL Final   Calcium 11/14/2021 9.4  8.9 - 10.3 mg/dL Final   GFR, Estimated 11/14/2021 >60  >60 mL/min Final   Comment: (NOTE) Calculated using the CKD-EPI Creatinine Equation (2021)    Anion gap 11/14/2021 7  5 - 15 Final   Performed at [facility], [place], [place] [address]   Color, Urine 11/14/2021 YELLOW (A)  YELLOW Final   APPearance 11/14/2021 CLEAR (A)  CLEAR Final   Specific Gravity, Urine 11/14/2021 1.015  1.005 - 1.030 Final   pH 11/14/2021 5.0  5.0 - 8.0 Final   Glucose, UA 11/14/2021 NEGATIVE  NEGATIVE mg/dL Final   Hgb urine dipstick 11/14/2021 NEGATIVE  NEGATIVE Final   Bilirubin Urine 11/14/2021 NEGATIVE  NEGATIVE Final   Ketones, ur 11/14/2021 NEGATIVE  NEGATIVE mg/dL Final   Protein, ur 11/14/2021 NEGATIVE  NEGATIVE mg/dL Final   Nitrite 11/14/2021 NEGATIVE  NEGATIVE Final   Leukocytes,Ua 11/14/2021 NEGATIVE  NEGATIVE Final   WBC, UA 11/14/2021 0-5  0 - 5 WBC/hpf Final   Bacteria, UA 11/14/2021 NONE SEEN  NONE SEEN Final   Squamous Epithelial / LPF 11/14/2021 0-5  0 - 5 Final   Mucus 11/14/2021 PRESENT   Final   Hyaline Casts, UA 11/14/2021 PRESENT   Final   Performed at [facility], [address] [place], [place] [address]    ECG: Date: 06/08/2021 Time ECG obtained: 0710 AM Rate: 59 bpm Rhythm:  Sinus rhythm; IRBBB Axis (leads I and aVF): Normal Intervals: PR 189 ms. QRS 107 ms. QTc 420 ms. ST segment and T wave changes: No evidence of acute ST segment elevation or depression Comparison: Similar to previous tracing obtained on 01/15/2020   IMAGING / PROCEDURES: LEFT HEART CATHETERIZATION AND CORONARY ANGIOGRAPHY performed on 06/08/2021 Normal left ventricular systolic function with an EF of 55-65% Normal LVEDP  Multivessel CAD 30% ostial LM  100% proximal to mid LAD  50% ostial to proximal LAD 60% proximal LAD 30% proximal RCA 75% RPDA 75% RPAV  Recommendations: continue medical management    MYOCARDIAL PERFUSION IMAGING STUDY ([facility]) performed on 05/17/2021 Low normal left ventricular systolic function with an EF of 51% No regional wall motion abnormalities SPECT images demonstrate a small reversible perfusion abnormality of mild intensity present in the inferoapical and septal  region on stress images consistent with ischemia.  TRANSTHORACIC ECHOCARDIOGRAM performed on 02/27/2018 The left ventricle has low normal systolic function, with an ejection fraction of 50-55%. The cavity size was normal. Left ventricular diastolic parameters were normal.  The right ventricle has normal systolic function. The cavity was normal. There is no increase in right ventricular wall thickness.  Trivial pericardial effusion is present.  The mitral valve is normal in structure.  The tricuspid valve is normal in structure.  The aortic valve is tricuspid.  The pulmonic valve was normal in structure.   Impression and Plan:  Michael Humphrey has been referred for pre-anesthesia review and clearance prior to him undergoing the planned anesthetic and procedural courses. Available labs, pertinent testing, and imaging results were personally reviewed by me. This patient has been appropriately cleared by cardiology with an overall *** risk of significant perioperative cardiovascular complications.  Based on clinical review performed today (11/14/21), barring any significant acute changes in the patient's overall condition, it is anticipated that he will be able to proceed with the planned surgical intervention. Any acute changes in clinical condition may necessitate his procedure being postponed and/or cancelled. Patient will meet with anesthesia team (MD and/or CRNA) on the day of his procedure for preoperative evaluation/assessment. Questions regarding anesthetic course will be fielded at that time.   Pre-surgical instructions were reviewed with the patient during his PAT appointment and questions were fielded by PAT clinical staff. Patient was advised that if any questions or concerns arise prior to his procedure then he should return a call to PAT and/or his surgeon's office to discuss.  Honor Loh, MSN, APRN, FNP-C, CEN [facility]  Peri-operative Services Nurse  Practitioner Phone: [phone] Fax: [phone] 11/14/21 1:46 PM  NOTE: This note has been prepared using Dragon dictation software. Despite my best ability to proofread, there is always the potential that unintentional transcriptional errors may still occur from this process.

## 2021-11-15 DIAGNOSIS — E113411 Type 2 diabetes mellitus with severe nonproliferative diabetic retinopathy with macular edema, right eye: Secondary | ICD-10-CM | POA: Diagnosis not present

## 2021-11-15 DIAGNOSIS — E113412 Type 2 diabetes mellitus with severe nonproliferative diabetic retinopathy with macular edema, left eye: Secondary | ICD-10-CM | POA: Diagnosis not present

## 2021-11-16 ENCOUNTER — Ambulatory Visit
Admission: RE | Admit: 2021-11-16 | Discharge: 2021-11-16 | Disposition: A | Discharge status: Home / Self Care | Payer: PPO | Attending: Orthopedic Surgery | Admitting: Orthopedic Surgery

## 2021-11-16 ENCOUNTER — Encounter: Payer: Self-pay | Admitting: Orthopedic Surgery

## 2021-11-16 ENCOUNTER — Other Ambulatory Visit: Payer: Self-pay

## 2021-11-16 ENCOUNTER — Ambulatory Visit: Payer: PPO | Admitting: Urgent Care

## 2021-11-16 ENCOUNTER — Encounter
Admission: RE | Disposition: A | Discharge status: Home / Self Care | Payer: Self-pay | Source: Home / Self Care | Attending: Orthopedic Surgery

## 2021-11-16 ENCOUNTER — Ambulatory Visit: Payer: PPO

## 2021-11-16 DIAGNOSIS — X58XXXA Exposure to other specified factors, initial encounter: Secondary | ICD-10-CM | POA: Insufficient documentation

## 2021-11-16 DIAGNOSIS — Z87891 Personal history of nicotine dependence: Secondary | ICD-10-CM | POA: Diagnosis not present

## 2021-11-16 DIAGNOSIS — S46111A Strain of muscle, fascia and tendon of long head of biceps, right arm, initial encounter: Secondary | ICD-10-CM | POA: Diagnosis not present

## 2021-11-16 DIAGNOSIS — Z86711 Personal history of pulmonary embolism: Secondary | ICD-10-CM | POA: Insufficient documentation

## 2021-11-16 DIAGNOSIS — Z6829 Body mass index (BMI) 29.0-29.9, adult: Secondary | ICD-10-CM | POA: Diagnosis not present

## 2021-11-16 DIAGNOSIS — M19011 Primary osteoarthritis, right shoulder: Secondary | ICD-10-CM | POA: Insufficient documentation

## 2021-11-16 DIAGNOSIS — G8918 Other acute postprocedural pain: Secondary | ICD-10-CM | POA: Diagnosis not present

## 2021-11-16 DIAGNOSIS — Z7984 Long term (current) use of oral hypoglycemic drugs: Secondary | ICD-10-CM | POA: Insufficient documentation

## 2021-11-16 DIAGNOSIS — I10 Essential (primary) hypertension: Secondary | ICD-10-CM | POA: Diagnosis not present

## 2021-11-16 DIAGNOSIS — E11319 Type 2 diabetes mellitus with unspecified diabetic retinopathy without macular edema: Secondary | ICD-10-CM | POA: Insufficient documentation

## 2021-11-16 DIAGNOSIS — M25811 Other specified joint disorders, right shoulder: Secondary | ICD-10-CM | POA: Diagnosis not present

## 2021-11-16 DIAGNOSIS — S43431A Superior glenoid labrum lesion of right shoulder, initial encounter: Secondary | ICD-10-CM | POA: Diagnosis not present

## 2021-11-16 DIAGNOSIS — Z01812 Encounter for preprocedural laboratory examination: Secondary | ICD-10-CM

## 2021-11-16 DIAGNOSIS — N183 Chronic kidney disease, stage 3 unspecified: Secondary | ICD-10-CM | POA: Insufficient documentation

## 2021-11-16 DIAGNOSIS — S46011A Strain of muscle(s) and tendon(s) of the rotator cuff of right shoulder, initial encounter: Secondary | ICD-10-CM | POA: Diagnosis not present

## 2021-11-16 DIAGNOSIS — I252 Old myocardial infarction: Secondary | ICD-10-CM | POA: Insufficient documentation

## 2021-11-16 DIAGNOSIS — Z86718 Personal history of other venous thrombosis and embolism: Secondary | ICD-10-CM | POA: Insufficient documentation

## 2021-11-16 DIAGNOSIS — M7541 Impingement syndrome of right shoulder: Secondary | ICD-10-CM | POA: Diagnosis not present

## 2021-11-16 DIAGNOSIS — M13811 Other specified arthritis, right shoulder: Secondary | ICD-10-CM | POA: Diagnosis not present

## 2021-11-16 DIAGNOSIS — M75101 Unspecified rotator cuff tear or rupture of right shoulder, not specified as traumatic: Secondary | ICD-10-CM | POA: Diagnosis not present

## 2021-11-16 DIAGNOSIS — E1122 Type 2 diabetes mellitus with diabetic chronic kidney disease: Secondary | ICD-10-CM | POA: Insufficient documentation

## 2021-11-16 DIAGNOSIS — E669 Obesity, unspecified: Secondary | ICD-10-CM | POA: Diagnosis not present

## 2021-11-16 DIAGNOSIS — Z8673 Personal history of transient ischemic attack (TIA), and cerebral infarction without residual deficits: Secondary | ICD-10-CM | POA: Insufficient documentation

## 2021-11-16 DIAGNOSIS — E7800 Pure hypercholesterolemia, unspecified: Secondary | ICD-10-CM | POA: Diagnosis not present

## 2021-11-16 DIAGNOSIS — I2510 Atherosclerotic heart disease of native coronary artery without angina pectoris: Secondary | ICD-10-CM | POA: Insufficient documentation

## 2021-11-16 DIAGNOSIS — I129 Hypertensive chronic kidney disease with stage 1 through stage 4 chronic kidney disease, or unspecified chronic kidney disease: Secondary | ICD-10-CM | POA: Diagnosis not present

## 2021-11-16 DIAGNOSIS — K219 Gastro-esophageal reflux disease without esophagitis: Secondary | ICD-10-CM | POA: Insufficient documentation

## 2021-11-16 DIAGNOSIS — G4733 Obstructive sleep apnea (adult) (pediatric): Secondary | ICD-10-CM | POA: Diagnosis not present

## 2021-11-16 HISTORY — DX: Ankylosing hyperostosis (forestier), site unspecified: M48.10

## 2021-11-16 HISTORY — DX: Type 2 diabetes mellitus without complications: E11.9

## 2021-11-16 HISTORY — DX: Unspecified right bundle-branch block: I45.10

## 2021-11-16 HISTORY — PX: SHOULDER ARTHROSCOPY WITH OPEN ROTATOR CUFF REPAIR AND DISTAL CLAVICLE ACROMINECTOMY: SHX5683

## 2021-11-16 HISTORY — DX: Unspecified malignant neoplasm of skin, unspecified: C44.90

## 2021-11-16 HISTORY — DX: Complete loss of teeth, unspecified cause, unspecified class: K08.109

## 2021-11-16 HISTORY — DX: Sleep apnea, unspecified: G47.30

## 2021-11-16 HISTORY — DX: Disorder of arteries and arterioles, unspecified: I77.9

## 2021-11-16 HISTORY — DX: Benign neoplasm of colon, unspecified: D12.6

## 2021-11-16 HISTORY — DX: Cerebrovascular disease, unspecified: I67.9

## 2021-11-16 HISTORY — DX: Atherosclerotic heart disease of native coronary artery without angina pectoris: I25.10

## 2021-11-16 HISTORY — DX: Other pulmonary embolism without acute cor pulmonale: I26.99

## 2021-11-16 HISTORY — DX: Type 2 diabetes mellitus with unspecified diabetic retinopathy without macular edema: E11.319

## 2021-11-16 HISTORY — DX: Diverticulitis of intestine, part unspecified, without perforation or abscess without bleeding: K57.92

## 2021-11-16 HISTORY — DX: Other specified health status: Z78.9

## 2021-11-16 LAB — GLUCOSE, CAPILLARY
Glucose-Capillary: 141 mg/dL — ABNORMAL HIGH (ref 70–99)
Glucose-Capillary: 167 mg/dL — ABNORMAL HIGH (ref 70–99)

## 2021-11-16 SURGERY — SHOULDER ARTHROSCOPY WITH OPEN ROTATOR CUFF REPAIR AND DISTAL CLAVICLE ACROMINECTOMY
Anesthesia: General | Site: Shoulder | Laterality: Right

## 2021-11-16 MED ORDER — PHENYLEPHRINE HCL-NACL 20-0.9 MG/250ML-% IV SOLN
INTRAVENOUS | Status: AC
  Filled 2021-11-16: qty 250

## 2021-11-16 MED ORDER — CHLORHEXIDINE GLUCONATE CLOTH 2 % EX PADS
6.0000 | MEDICATED_PAD | Freq: Once | CUTANEOUS | Status: AC
  Administered 2021-11-16: 6 via TOPICAL

## 2021-11-16 MED ORDER — MIDAZOLAM HCL 2 MG/2ML IJ SOLN
INTRAMUSCULAR | Status: AC
  Administered 2021-11-16: 1 mg via INTRAVENOUS
  Filled 2021-11-16: qty 2

## 2021-11-16 MED ORDER — MIDAZOLAM HCL 2 MG/2ML IJ SOLN: 1.0000 mg | INTRAMUSCULAR | Status: DC | PRN

## 2021-11-16 MED ORDER — FAMOTIDINE 20 MG PO TABS
ORAL_TABLET | ORAL | Status: AC
  Administered 2021-11-16: 20 mg via ORAL
  Filled 2021-11-16: qty 1

## 2021-11-16 MED ORDER — PROPOFOL 10 MG/ML IV BOLUS
INTRAVENOUS | Status: AC
  Filled 2021-11-16: qty 40

## 2021-11-16 MED ORDER — LACTATED RINGERS IR SOLN
Status: DC | PRN
  Administered 2021-11-16 (×4): 3001 mL

## 2021-11-16 MED ORDER — SUGAMMADEX SODIUM 200 MG/2ML IV SOLN
INTRAVENOUS | Status: DC | PRN
  Administered 2021-11-16: 200 mg via INTRAVENOUS

## 2021-11-16 MED ORDER — NEOMYCIN-POLYMYXIN B GU 40-200000 IR SOLN
Status: AC
  Filled 2021-11-16: qty 20

## 2021-11-16 MED ORDER — BUPIVACAINE LIPOSOME 1.3 % IJ SUSP
INTRAMUSCULAR | Status: AC
  Filled 2021-11-16: qty 20

## 2021-11-16 MED ORDER — CEFAZOLIN SODIUM-DEXTROSE 2-4 GM/100ML-% IV SOLN
2.0000 g | INTRAVENOUS | Status: AC
  Administered 2021-11-16: 2 g via INTRAVENOUS

## 2021-11-16 MED ORDER — ROCURONIUM BROMIDE 10 MG/ML (PF) SYRINGE
PREFILLED_SYRINGE | INTRAVENOUS | Status: AC
  Filled 2021-11-16: qty 10

## 2021-11-16 MED ORDER — DEXAMETHASONE SODIUM PHOSPHATE 10 MG/ML IJ SOLN
INTRAMUSCULAR | Status: DC | PRN
  Administered 2021-11-16: 5 mg via INTRAVENOUS

## 2021-11-16 MED ORDER — FENTANYL CITRATE (PF) 100 MCG/2ML IJ SOLN
INTRAMUSCULAR | Status: AC
  Filled 2021-11-16: qty 2

## 2021-11-16 MED ORDER — ROCURONIUM BROMIDE 100 MG/10ML IV SOLN
INTRAVENOUS | Status: DC | PRN
  Administered 2021-11-16: 40 mg via INTRAVENOUS

## 2021-11-16 MED ORDER — CHLORHEXIDINE GLUCONATE 0.12 % MT SOLN
OROMUCOSAL | Status: AC
  Administered 2021-11-16: 15 mL via OROMUCOSAL
  Filled 2021-11-16: qty 15

## 2021-11-16 MED ORDER — BUPIVACAINE HCL (PF) 0.5 % IJ SOLN
INTRAMUSCULAR | Status: AC
  Filled 2021-11-16: qty 10

## 2021-11-16 MED ORDER — BUPIVACAINE HCL (PF) 0.5 % IJ SOLN
INTRAMUSCULAR | Status: DC | PRN
  Administered 2021-11-16: 10 mL via PERINEURAL

## 2021-11-16 MED ORDER — BUPIVACAINE LIPOSOME 1.3 % IJ SUSP
INTRAMUSCULAR | Status: DC | PRN
  Administered 2021-11-16: 20 mL via PERINEURAL

## 2021-11-16 MED ORDER — ORAL CARE MOUTH RINSE: 15.0000 mL | Freq: Once | OROMUCOSAL | Status: AC

## 2021-11-16 MED ORDER — DEXAMETHASONE SODIUM PHOSPHATE 10 MG/ML IJ SOLN
INTRAMUSCULAR | Status: AC
  Filled 2021-11-16: qty 1

## 2021-11-16 MED ORDER — FENTANYL CITRATE PF 50 MCG/ML IJ SOSY
PREFILLED_SYRINGE | INTRAMUSCULAR | Status: AC
  Administered 2021-11-16: 50 ug via INTRAVENOUS
  Filled 2021-11-16: qty 1

## 2021-11-16 MED ORDER — PHENYLEPHRINE 80 MCG/ML (10ML) SYRINGE FOR IV PUSH (FOR BLOOD PRESSURE SUPPORT)
PREFILLED_SYRINGE | INTRAVENOUS | Status: DC | PRN
  Administered 2021-11-16: 80 ug via INTRAVENOUS

## 2021-11-16 MED ORDER — FENTANYL CITRATE PF 50 MCG/ML IJ SOSY: 50.0000 ug | PREFILLED_SYRINGE | INTRAMUSCULAR | Status: DC | PRN

## 2021-11-16 MED ORDER — PROPOFOL 10 MG/ML IV BOLUS
INTRAVENOUS | Status: DC | PRN
  Administered 2021-11-16: 100 mg via INTRAVENOUS

## 2021-11-16 MED ORDER — LIDOCAINE HCL (PF) 1 % IJ SOLN
INTRAMUSCULAR | Status: AC
  Filled 2021-11-16: qty 5

## 2021-11-16 MED ORDER — LIDOCAINE HCL (PF) 1 % IJ SOLN
INTRAMUSCULAR | Status: DC | PRN
  Administered 2021-11-16: 4 mL via SUBCUTANEOUS

## 2021-11-16 MED ORDER — ONDANSETRON HCL 4 MG/2ML IJ SOLN
INTRAMUSCULAR | Status: DC | PRN
  Administered 2021-11-16: 4 mg via INTRAVENOUS

## 2021-11-16 MED ORDER — ONDANSETRON HCL 4 MG/2ML IJ SOLN: 4.0000 mg | Freq: Once | INTRAMUSCULAR | Status: DC | PRN

## 2021-11-16 MED ORDER — CHLORHEXIDINE GLUCONATE 0.12 % MT SOLN: 15.0000 mL | Freq: Once | OROMUCOSAL | Status: AC

## 2021-11-16 MED ORDER — ONDANSETRON HCL 4 MG/2ML IJ SOLN
INTRAMUSCULAR | Status: AC
  Filled 2021-11-16: qty 2

## 2021-11-16 MED ORDER — EPINEPHRINE PF 1 MG/ML IJ SOLN
INTRAMUSCULAR | Status: AC
  Filled 2021-11-16: qty 4

## 2021-11-16 MED ORDER — LACTATED RINGERS IR SOLN
Status: DC | PRN
  Administered 2021-11-16: 6000 mL
  Administered 2021-11-16: 3000 mL

## 2021-11-16 MED ORDER — FENTANYL CITRATE (PF) 100 MCG/2ML IJ SOLN: 25.0000 ug | INTRAMUSCULAR | Status: DC | PRN

## 2021-11-16 MED ORDER — FAMOTIDINE 20 MG PO TABS: 20.0000 mg | ORAL_TABLET | Freq: Once | ORAL | Status: AC

## 2021-11-16 MED ORDER — BUPIVACAINE HCL (PF) 0.25 % IJ SOLN
INTRAMUSCULAR | Status: AC
  Filled 2021-11-16: qty 30

## 2021-11-16 MED ORDER — ACETAMINOPHEN 500 MG PO TABS: 1000.0000 mg | ORAL_TABLET | ORAL | Status: AC

## 2021-11-16 MED ORDER — SODIUM CHLORIDE 0.9 % IV SOLN: INTRAVENOUS | Status: DC | PRN

## 2021-11-16 MED ORDER — EPINEPHRINE PF 1 MG/ML IJ SOLN
INTRAMUSCULAR | Status: AC
  Filled 2021-11-16: qty 1

## 2021-11-16 MED ORDER — LIDOCAINE HCL (PF) 2 % IJ SOLN
INTRAMUSCULAR | Status: AC
  Filled 2021-11-16: qty 5

## 2021-11-16 MED ORDER — ONDANSETRON HCL 4 MG PO TABS: 4.0000 mg | ORAL_TABLET | Freq: Three times a day (TID) | ORAL | 0 refills | Status: DC | PRN

## 2021-11-16 MED ORDER — OXYCODONE HCL 5 MG PO TABS: 5.0000 mg | ORAL_TABLET | Freq: Four times a day (QID) | ORAL | 0 refills | Status: DC | PRN

## 2021-11-16 MED ORDER — LIDOCAINE HCL (PF) 1 % IJ SOLN
INTRAMUSCULAR | Status: AC
  Filled 2021-11-16: qty 30

## 2021-11-16 MED ORDER — CEFAZOLIN SODIUM-DEXTROSE 2-4 GM/100ML-% IV SOLN
INTRAVENOUS | Status: AC
  Filled 2021-11-16: qty 100

## 2021-11-16 MED ORDER — LIDOCAINE HCL (CARDIAC) PF 100 MG/5ML IV SOSY
PREFILLED_SYRINGE | INTRAVENOUS | Status: DC | PRN
  Administered 2021-11-16: 80 mg via INTRAVENOUS

## 2021-11-16 MED ORDER — ACETAMINOPHEN 500 MG PO TABS
ORAL_TABLET | ORAL | Status: AC
  Administered 2021-11-16: 1000 mg via ORAL
  Filled 2021-11-16: qty 2

## 2021-11-16 MED ORDER — PHENYLEPHRINE HCL-NACL 20-0.9 MG/250ML-% IV SOLN
INTRAVENOUS | Status: DC | PRN
  Administered 2021-11-16: 30 ug/min via INTRAVENOUS

## 2021-11-16 SURGICAL SUPPLY — 72 items
ADAPTER IRRIG TUBE 2 SPIKE SOL (ADAPTER) ×2 IMPLANT
ADPR TBG 2 SPK PMP STRL ASCP (ADAPTER) ×2
ANCH SUT 5.5 KNTLS PEEK (Orthopedic Implant) ×2 IMPLANT
ANCH SUT Q-FX 2.8 (Anchor) ×2 IMPLANT
ANCHOR ALL-SUT Q-FIX 2.8 (Anchor) ×8 IMPLANT
ANCHOR SUT 5.5 MULTIFIX (Orthopedic Implant) IMPLANT
ANCHOR SUT BIOC ST 3X145 (Anchor) IMPLANT
BLADE SHAVER 4.5X7 STR FR (MISCELLANEOUS) IMPLANT
CANNULA 5.75X7 CRYSTAL CLEAR (CANNULA) ×2 IMPLANT
CANNULA PARTIAL THREAD 2X7 (CANNULA) ×1 IMPLANT
CANNULA TWIST IN 8.25X9CM (CANNULA) ×2 IMPLANT
CONNECTOR PERFECT PASSER (CONNECTOR) ×2 IMPLANT
COOLER POLAR GLACIER W/PUMP (MISCELLANEOUS) ×1 IMPLANT
DEVICE SUCT BLK HOLE OR FLOOR (MISCELLANEOUS) ×2 IMPLANT
DRAPE 3/4 80X56 (DRAPES) ×1 IMPLANT
DRAPE INCISE IOBAN 66X45 STRL (DRAPES) ×1 IMPLANT
DRAPE U-SHAPE 47X51 STRL (DRAPES) ×1 IMPLANT
DURAPREP 26ML APPLICATOR (WOUND CARE) ×3 IMPLANT
ELECT REM PT RETURN 9FT ADLT (ELECTROSURGICAL) ×1
ELECTRODE REM PT RTRN 9FT ADLT (ELECTROSURGICAL) ×1 IMPLANT
GAUZE SPONGE 4X4 12PLY STRL (GAUZE/BANDAGES/DRESSINGS) ×1 IMPLANT
GAUZE XEROFORM 1X8 LF (GAUZE/BANDAGES/DRESSINGS) ×1 IMPLANT
GLOVE BIOGEL PI IND STRL 9 (GLOVE) ×1 IMPLANT
GLOVE BIOGEL PI ORTHO SZ9 (GLOVE) ×6 IMPLANT
GOWN STRL REUS W/ TWL LRG LVL3 (GOWN DISPOSABLE) ×1 IMPLANT
GOWN STRL REUS W/TWL LRG LVL3 (GOWN DISPOSABLE) ×1
IV LACTATED RINGER IRRG 3000ML (IV SOLUTION) ×6
IV LR IRRIG 3000ML ARTHROMATIC (IV SOLUTION) ×8 IMPLANT
KIT STABILIZATION SHOULDER (MISCELLANEOUS) ×1 IMPLANT
KIT SUTURE 2.8 Q-FIX DISP (MISCELLANEOUS) IMPLANT
KIT SUTURETAK 3.0 INSERT PERC (KITS) IMPLANT
KIT TURNOVER KIT A (KITS) ×1 IMPLANT
MANIFOLD NEPTUNE II (INSTRUMENTS) ×2 IMPLANT
MASK FACE SPIDER DISP (MASK) ×1 IMPLANT
MAT ABSORB  FLUID 56X50 GRAY (MISCELLANEOUS) ×3
MAT ABSORB FLUID 56X50 GRAY (MISCELLANEOUS) ×3 IMPLANT
NDL SAFETY ECLIP 18X1.5 (MISCELLANEOUS) ×1 IMPLANT
NEEDLE HYPO 22GX1.5 SAFETY (NEEDLE) ×1 IMPLANT
NS IRRIG 500ML POUR BTL (IV SOLUTION) ×1 IMPLANT
PACK ARTHROSCOPY SHOULDER (MISCELLANEOUS) ×1 IMPLANT
PAD ABD DERMACEA PRESS 5X9 (GAUZE/BANDAGES/DRESSINGS) ×1 IMPLANT
PAD ARMBOARD 7.5X6 YLW CONV (MISCELLANEOUS) ×2 IMPLANT
PAD WRAPON POLAR SHDR XLG (MISCELLANEOUS) ×1 IMPLANT
PASSER SUT FIRSTPASS SELF (INSTRUMENTS) IMPLANT
SHAVER BLADE BONE CUTTER 4.5 (BLADE) ×1 IMPLANT
SHAVER BLADE TAPERED BLUNT 4 (BLADE) ×1 IMPLANT
SLEEVE REMOTE CONTROL 5X12 (DRAPES) IMPLANT
SPONGE T-LAP 18X18 ~~LOC~~+RFID (SPONGE) ×1 IMPLANT
STRIP CLOSURE SKIN 1/2X4 (GAUZE/BANDAGES/DRESSINGS) ×1 IMPLANT
SUT ETHILON 4-0 (SUTURE) ×1
SUT ETHILON 4-0 FS2 18XMFL BLK (SUTURE) ×1
SUT LASSO 90 DEG SD STR (SUTURE) IMPLANT
SUT MNCRL 4-0 (SUTURE) ×1
SUT MNCRL 4-0 27XMFL (SUTURE) ×1
SUT PDS AB 0 CT1 27 (SUTURE) ×3 IMPLANT
SUT PERFECTPASSER WHITE CART (SUTURE) IMPLANT
SUT SMART STITCH CARTRIDGE (SUTURE) IMPLANT
SUT ULTRABRAID 2 COBRAID 38 (SUTURE) IMPLANT
SUT VIC AB 0 CT1 36 (SUTURE) ×3 IMPLANT
SUT VIC AB 2-0 CT2 27 (SUTURE) ×1 IMPLANT
SUT VICRYL 0 UR6 27IN ABS (SUTURE) IMPLANT
SUTURE ETHLN 4-0 FS2 18XMF BLK (SUTURE) ×1 IMPLANT
SUTURE MNCRL 4-0 27XMF (SUTURE) ×1 IMPLANT
SYR 10ML LL (SYRINGE) ×1 IMPLANT
TAPE MICROFOAM 4IN (TAPE) ×1 IMPLANT
TRAP FLUID SMOKE EVACUATOR (MISCELLANEOUS) ×1 IMPLANT
TUBING CONNECTING 10 (TUBING) ×1 IMPLANT
TUBING INFLOW SET DBFLO PUMP (TUBING) ×1 IMPLANT
TUBING OUTFLOW SET DBLFO PUMP (TUBING) ×1 IMPLANT
WAND WEREWOLF FLOW 90D (MISCELLANEOUS) ×1 IMPLANT
WATER STERILE IRR 500ML POUR (IV SOLUTION) ×1 IMPLANT
WRAPON POLAR PAD SHDR XLG (MISCELLANEOUS) ×1

## 2021-11-16 NOTE — Evaluation (Signed)
Physical Therapy Evaluation Patient Details Name: Michael Humphrey MRN: [id number] DOB: September 29, 1950 Today's Date: 11/16/2021  History of Present Illness  Pt admitted for R UE rotater cuff tear s/p repair. Pt with history of multiple surgeries and CVA.  Clinical Impression  Pt is a pleasant 71 year old male who was admitted for R rotator cuff repair. Currently NWB to R UE with shoulder immobilizer in place. Pt performs bed mobility with min assist, transfers with cga, and ambulation with cga and SPC. Pt demonstrates deficits with strength/mobility/balance. Pt demonstrates poor safety awareness with ambulation and hit shoulder on door frame despite cues for body awareness. Concerns addressed to wife/nurse/MD about need for increased supervision at home as pt is unsteady with mobility. Pt very adamant about discharge and doesn't want follow up therapy. Would benefit from skilled PT to address above deficits and promote optimal return to PLOF. Highly recommend transition to [place] upon discharge from acute hospitalization.      Recommendations for follow up therapy are one component of a multi-disciplinary discharge planning process, led by the attending physician.  Recommendations may be updated based on patient status, additional functional criteria and insurance authorization.  Follow Up Recommendations Home health PT      Assistance Recommended at Discharge Frequent or constant Supervision/Assistance  Patient can return home with the following  A little help with walking and/or transfers;A lot of help with bathing/dressing/bathroom    Equipment Recommendations None recommended by PT  Recommendations for Other Services       Functional Status Assessment Patient has had a recent decline in their functional status and demonstrates the ability to make significant improvements in function in a reasonable and predictable amount of time.     Precautions / Restrictions  Precautions Precautions: Fall Restrictions Weight Bearing Restrictions: Yes RUE Weight Bearing: Non weight bearing      Mobility  Bed Mobility Overal bed mobility: Needs Assistance Bed Mobility: Supine to Sit     Supine to sit: Min assist     General bed mobility comments: needs assist for trunkal elevation. ONce seated at EOB, upright posture noted    Transfers Overall transfer level: Needs assistance Equipment used: Straight cane Transfers: Sit to/from Stand Sit to Stand: Min guard           General transfer comment: braces against bed to stand. Able to stand with upright posture and SPC. No dizziness    Ambulation/Gait Ambulation/Gait assistance: Min guard Gait Distance (Feet): 100 Feet Assistive device: Straight cane Gait Pattern/deviations: Step-through pattern       General Gait Details: short step length with wide BOS. Unsteadiness noted, however no formal LOB. Occasionally ambulates too quickly with cues for safety  Stairs            Wheelchair Mobility    Modified Rankin (Stroke Patients Only)       Balance Overall balance assessment: Needs assistance Sitting-balance support: Feet supported Sitting balance-Leahy Scale: Good     Standing balance support: Single extremity supported Standing balance-Leahy Scale: Fair                               Pertinent Vitals/Pain Pain Assessment Pain Assessment: No/denies pain    Home Living Family/patient expects to be discharged to:: Private residence Living Arrangements: Spouse/significant other Available Help at Discharge: Family;Available 24 hours/day Type of Home: House Home Access: Ramped entrance       Home Layout: One level  Home Equipment: Cane - single point;BSC/3in1 Additional Comments: pt reports he uses [facility] for all mobility and hasn't had any falls recently.    Prior Function Prior Level of Function : Independent/Modified Independent;Driving              Mobility Comments: uses SPC       Hand Dominance        Extremity/Trunk Assessment   Upper Extremity Assessment Upper Extremity Assessment: Generalized weakness (R UE grip 3/5; arm NT due to surgery. L UE grossly 5/5)    Lower Extremity Assessment Lower Extremity Assessment: Overall WFL for tasks assessed       Communication   Communication: No difficulties  Cognition Arousal/Alertness: Awake/alert Behavior During Therapy: WFL for tasks assessed/performed Overall Cognitive Status: Within Functional Limits for tasks assessed                                 General Comments: has multiple complaints about hospital; grouchy        General Comments      Exercises Other Exercises Other Exercises: educated on polar care and hand exercises. Educated on correct WBing and further PT recommendations Other Exercises: ambulated to bathroom with cga. Pt with poor aim and voided on floor. Required assist for cleaning up floor. Poor awareness   Assessment/Plan    PT Assessment All further PT needs can be met in the next venue of care  PT Problem List Decreased balance;Decreased mobility       PT Treatment Interventions      PT Goals (Current goals can be found in the Care Plan section)  Acute Rehab PT Goals Patient Stated Goal: to go home immediately PT Goal Formulation: All assessment and education complete, DC therapy Time For Goal Achievement: 11/16/21 Potential to Achieve Goals: Good    Frequency       Co-evaluation               AM-PAC PT "6 Clicks" Mobility  Outcome Measure Help needed turning from your back to your side while in a flat bed without using bedrails?: None Help needed moving from lying on your back to sitting on the side of a flat bed without using bedrails?: A Little Help needed moving to and from a bed to a chair (including a wheelchair)?: A Little Help needed standing up from a chair using your arms (e.g., wheelchair or  bedside chair)?: A Little Help needed to walk in hospital room?: A Little Help needed climbing 3-5 steps with a railing? : A Lot 6 Click Score: 18    End of Session Equipment Utilized During Treatment: Gait belt Activity Tolerance: Patient tolerated treatment well Patient left: in chair Nurse Communication: Mobility status PT Visit Diagnosis: Difficulty in walking, not elsewhere classified (R26.2)    Time: 9518-8416 PT Time Calculation (min) (ACUTE ONLY): 33 min   Charges:   PT Evaluation $PT Eval Low Complexity: 1 Low PT Treatments $Gait Training: 8-22 mins        Greggory Stallion, PT, DPT, GCS [phone]   Andrell Tallman 11/16/2021, 3:18 PM

## 2021-11-16 NOTE — Progress Notes (Signed)
Patient arrived in Post op, unable to stand and ambulate from stretcher. Dr. Mack Guise notified and ordered a Physical Therapy evaluation for patient and home situation. Patient's wife is blind but at bedside. Physical Therapy notified and evaluated patient. Pt demands to be discharged from [place], detailed polar care and shoulder immoblizer instructions given to patient and spouse. Both verbalize understanding. Patient and spouse discharged from [facility], [place] transporting patient and spouse home. Patient ambulated onto shuttle but required 1 person assist.

## 2021-11-16 NOTE — H&P (Signed)
PREOPERATIVE H&P  Chief Complaint: Right Shoulder Rotator Cuff Tear  HPI: Michael Humphrey is a 71 y.o. male who presents for preoperative history and physical with a diagnosis of a full-thickness and retracted right Shoulder Rotator Cuff Tear confirmed by MRI. Symptoms of right shoulder pain, weakness and limited range of motion are significantly impairing activities of daily living.  Patient wished to proceed with a surgical repair of the right rotator cuff.  Past Medical History:  Diagnosis Date   Arthritis    CAD (coronary artery disease)    a.) LHC 08/22/2012: normal coronaries; b.) LHC 06/08/2021: EF 55-65%. 30% oLM, 100% p-mLAD, 50% o-pLAD, 60% pLAD, 30% pRCA, 75% RPDA, 75% RPAV -- med mgmt.   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   Diabetic retinopathy (HCC)    DISH (diffuse idiopathic skeletal hyperostosis)    Diverticulitis    Full dentures    GERD (gastroesophageal reflux disease)    High cholesterol    Hordeolum internum right lower eyelid 12/09/2019   Hypertension    Incomplete right bundle branch block (RBBB)    Lumbar radiculitis    a.) L4-L5 decompression   Posterior capsular opacification, 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 Reid Woodard recently and sent here   Pulmonary embolism (HCC)    Skin cancer    Sleep apnea    a.) unable to tolerate nocturnal PAP therapy   Statin intolerance    T2DM (type 2 diabetes mellitus) (HCC)    Tubular adenoma of colon    Past Surgical History:  Procedure Laterality Date   AMPUTATION TOE Left 01/15/2020   Procedure: AMPUTATION TOE MPJ T1;  Surgeon:  Fowler, Justin, DPM;  Location: [facility];  Service: Podiatry;  Laterality: Left;   BUNIONECTOMY     CATARACT EXTRACTION W/PHACO Left 12/11/2017   Procedure: CATARACT EXTRACTION PHACO AND INTRAOCULAR LENS PLACEMENT (IOC)  LEFT DIABETIC;  Surgeon: Brasington, Chadwick, MD;  Location: [facility];  Service: Ophthalmology;  Laterality: Left;  DIABETIC ([facility] picking pt up at 0600, needs 0630 arrival time)   CATARACT EXTRACTION W/PHACO Right 01/15/2018   Procedure: CATARACT EXTRACTION PHACO AND INTRAOCULAR LENS PLACEMENT (IOC)  RIGHT DIABETIC  leave so arrival will be around 7:45 ds;  Surgeon: Brasington, Chadwick, MD;  Location: [facility];  Service: Ophthalmology;  Laterality: Right;  Diabetic - oral meds   CHOLECYSTECTOMY     COLONOSCOPY     COLONOSCOPY N/A 08/22/2021   Procedure: COLONOSCOPY;  Surgeon: Wohl, Darren, MD;  Location: [facility];  Service: Endoscopy;  Laterality: N/A;   COLONOSCOPY WITH PROPOFOL N/A 12/17/2014   Procedure: COLONOSCOPY WITH PROPOFOL;  Surgeon: Martin U Skulskie, MD;  Location: [facility];  Service: Endoscopy;  Laterality: N/A;   correct hammertoe     FASCIECTOMY Right 10/29/2019   Procedure: PLANTAR FIBROMA RESECTION RIGHT;  Surgeon: Baker, Andrew, DPM;  Location: [facility];  Service: Podiatry;  Laterality: Right;  Diabetic - oral meds   JOINT REPLACEMENT     KNEE ARTHROSCOPY Right    LEFT HEART CATH AND CORONARY ANGIOGRAPHY N/A 06/08/2021   Procedure: LEFT HEART CATH AND CORONARY ANGIOGRAPHY;  Surgeon: Paraschos,   Alexander, MD;  Location: [facility];  Service: Cardiovascular;  Laterality: N/A;   LEFT HEART CATH AND CORONARY ANGIOGRAPHY Left 08/22/2012   Procedure: LEFT HEART CATH AND CORONARY ANGIOGRAPHY; Location: [facility]; Surgeon: Kenneth Fath, MD   LUMBAR LAMINECTOMY/ DECOMPRESSION WITH MET-RX N/A 02/18/2019   Procedure: L4-5 DECOMPRESSION;  Surgeon: Yarbrough, Chester, MD;  Location: [facility];  Service: Neurosurgery;   Laterality: N/A;   TOTAL HIP ARTHROPLASTY Bilateral    TOTAL KNEE ARTHROPLASTY Bilateral    Social History   Socioeconomic History   Marital status: Married    Spouse name: Patricia   Number of children: Not on file   Years of education: Not on file   Highest education level: Not on file  Occupational History   Not on file  Tobacco Use   Smoking status: Never   Smokeless tobacco: Former    Types: Chew    Quit date: 02/08/1978  Vaping Use   Vaping Use: Never used  Substance and Sexual Activity   Alcohol use: Not Currently   Drug use: No   Sexual activity: Not on file  Other Topics Concern   Not on file  Social History Narrative   Not on file   Social Determinants of Health   Financial Resource Strain: Not on file  Food Insecurity: Not on file  Transportation Needs: Not on file  Physical Activity: Not on file  Stress: Not on file  Social Connections: Not on file   Family History  Problem Relation Age of Onset   Clotting disorder Father    Hypertension Father    Allergies  Allergen Reactions   Oxycodone     hallucinations   Clonidine Derivatives     Hallucinations    Fioricet [Butalbital-Apap-Caffeine]     hallucination   Hydrocodone-Acetaminophen     hallucinations   Mobic [Meloxicam]     hallucinations   Norvasc [Amlodipine Besylate]     hallucinations   Statins     Muscle cramps   Tramadol Itching   Prior to Admission medications   Medication Sig Start Date End Date Taking? Authorizing Provider  acetaminophen (TYLENOL) 500 MG tablet Take 1,000 mg by mouth every 6 (six) hours as needed.   Yes [provider]  carvedilol (COREG) 12.5 MG tablet Take 12.5 mg by mouth 2 (two) times daily.  12/04/17  Yes [provider]  finasteride (PROSCAR) 5 MG tablet Take 5 mg by mouth every morning.    Yes [provider]  gabapentin (NEURONTIN) 600 MG tablet Take 600 mg by mouth 2 (two) times daily.   Yes [provider]   glimepiride (AMARYL) 2 MG tablet Take 2 mg by mouth daily with breakfast.   Yes [provider]  lisinopril (PRINIVIL,ZESTRIL) 2.5 MG tablet Take 1 tablet (2.5 mg total) by mouth daily. Patient taking differently: Take 2.5 mg by mouth every morning. 02/28/18  Yes Sainani, Vivek J, MD  methocarbamol (ROBAXIN) 500 MG tablet Take 1 tablet (500 mg total) by mouth every 8 (eight) hours as needed for muscle spasms. 07/16/20  Yes Smith, Dylan, MD  oxyCODONE (OXY IR/ROXICODONE) 5 MG immediate release tablet Take 5 mg by mouth every 6 (six) hours as needed for severe pain.   Yes [provider]  torsemide (DEMADEX) 20 MG tablet Take 20 mg by mouth daily as needed (swelling).   Yes [provider]  traZODone (DESYREL) 100 MG tablet Take 1 tablet (100 mg total) by mouth at bedtime as needed for sleep.   07/17/17 11/16/21 Yes Pyreddy, Pavan, MD  aspirin EC 81 MG tablet Take 81 mg by mouth daily. Swallow whole.    [provider]  metFORMIN (GLUCOPHAGE) 500 MG tablet Take 500 mg by mouth 2 (two) times daily.    [provider]     Positive ROS: All other systems have been reviewed and were otherwise negative with the exception of those mentioned in the HPI and as above.  Physical Exam: General: Alert, no acute distress Cardiovascular: Regular rate and rhythm, no murmurs rubs or gallops.  No pedal edema Respiratory: Clear to auscultation bilaterally, no wheezes rales or rhonchi. No cyanosis, no use of accessory musculature GI: No organomegaly, abdomen is soft and non-tender nondistended with positive bowel sounds. Skin: Skin intact, no lesions within the operative field. Neurologic: Sensation intact distally Psychiatric: Patient is competent for consent with normal mood and affect Lymphatic: No cervical lymphadenopathy  MUSCULOSKELETAL: Right shoulder: Patient can forward elevate to approximately 60 degrees and abduct to approximately 50 to 60 degrees. He continues  to have significant pain and weakness with resisted shoulder abduction and external rotation. He has full digital wrist and elbow range of motion, intact sensation to light touch and palpable radial pulse. He had no weakness to shoulder internal rotation.   Radiology: I reviewed the MRI images as well as the radiology report for the patient's right shoulder MRI. He has a massive rotator cuff tear with retraction of the supra and infraspinatus tendons by approximately 3 to 4 cm. Patient has mild muscle fatty atrophy. He has glenohumeral joint arthropathy with a worn labrum. He has a frayed partially torn subscapularis tendon involving the superior fibers with medial subluxation of the long head of the biceps. He has AC joint arthropathy with humeral head elevation.  Assessment: Right Shoulder Rotator Cuff Tear  Plan: Plan for Procedure(s): RIGHT SHOULDER ARTHROSCOPIC SUBACROMIAL DECOMPRESSION,  DISTAL CLAVICLE EXCISION WITH OPEN ROTATOR CUFF REPAIR  I discussed the risks and benefits of surgery. The risks include but are not limited to infection, bleeding, nerve or blood vessel injury, joint stiffness or loss of motion, persistent pain, weakness or instability, retear of the rotator cuff, failure of the hardware, inability to repair the rotator cuff and the need for further surgery.  Patient understood these risks and wished to proceed.     Annaliah Rivenbark, MD   11/16/2021 7:44 AM  

## 2021-11-16 NOTE — Anesthesia Procedure Notes (Signed)
Anesthesia Regional Block: Interscalene brachial plexus block   Pre-Anesthetic Checklist: , timeout performed,  Correct Patient, Correct Site, Correct Laterality,  Correct Procedure, Correct Position, site marked,  Risks and benefits discussed,  Surgical consent,  Pre-op evaluation,  At surgeon's request and post-op pain management  Laterality: Right and Upper  Prep: chloraprep       Needles:  Injection technique: Single-shot  Needle Type: Stimiplex     Needle Length: 5cm  Needle Gauge: 22     Additional Needles:   Procedures:,,,, ultrasound used (permanent image in chart),,    Narrative:  Start time: 11/16/2021 7:53 AM End time: 11/16/2021 7:55 AM Injection made incrementally with aspirations every 5 mL.  Performed by: Personally  Anesthesiologist: Martha Clan, MD  Additional Notes: Functioning IV was confirmed and monitors were applied.  A 19m 22ga Stimuplex needle was used. Sterile prep and drape,hand hygiene and sterile gloves were used.  Negative aspiration and negative test dose prior to incremental administration of local anesthetic. The patient tolerated the procedure well.

## 2021-11-16 NOTE — Anesthesia Procedure Notes (Signed)
Procedure Name: Intubation Date/Time: 11/16/2021 8:09 AM  Performed by: Loletha Grayer, CRNAPre-anesthesia Checklist: Patient identified, Patient being monitored, Timeout performed, Emergency Drugs available and Suction available Patient Re-evaluated:Patient Re-evaluated prior to induction Oxygen Delivery Method: Circle system utilized Preoxygenation: Pre-oxygenation with 100% oxygen Induction Type: IV induction Ventilation: Mask ventilation without difficulty Laryngoscope Size: 3 and McGraph Grade View: Grade I Tube type: Oral Tube size: 7.5 mm Number of attempts: 1 Airway Equipment and Method: Stylet Placement Confirmation: ETT inserted through vocal cords under direct vision, positive ETCO2 and breath sounds checked- equal and bilateral Secured at: 22 cm Tube secured with: Tape Dental Injury: Teeth and Oropharynx as per pre-operative assessment

## 2021-11-16 NOTE — Anesthesia Preprocedure Evaluation (Addendum)
Anesthesia Evaluation  Patient identified by MRN, date of birth, ID band Patient awake    Reviewed: Allergy & Precautions, NPO status , Patient's Chart, lab work & pertinent test results  History of Anesthesia Complications Negative for: history of anesthetic complications  Airway Mallampati: III   Neck ROM: Full    Dental  (+) Upper Dentures, Lower Dentures, Dental Advidsory Given   Pulmonary neg shortness of breath, neg COPD, neg recent URI, former smoker (quit 1980)   Pulmonary exam normal breath sounds clear to auscultation       Cardiovascular hypertension, (-) angina + CAD and + Past MI  (-) Cardiac Stents and (-) CABG Normal cardiovascular exam+ dysrhythmias (RBBB) (-) Valvular Problems/Murmurs Rhythm:Regular Rate:Normal  Hx DVT  ECG 06/08/21:  Sinus rhythm Incomplete left bundle branch block Consider anterolateral infarct Baseline wander in lead(s) V3 V6  Myocardial perfusion 05/17/21:  1. Normal left ventricular function  2. Normal wall motion  3. Mild to moderate inferoapical and septal ischemia   Neuro/Psych neg Seizures Diabetic retinopathy CVA (07/2017)  negative psych ROS   GI/Hepatic ,GERD  ,,  Endo/Other  diabetes, Type 2  Obesity   Renal/GU Renal disease (stage III CKD)     Musculoskeletal  (+) Arthritis ,    Abdominal   Peds  Hematology negative hematology ROS (+)   Anesthesia Other Findings Cardiology note 06/21/21:  71 y.o. male with  1. HTN (hypertension), malignant  2. Chest pain with high risk for cardiac etiology  3. History of pulmonary embolus (PE)  4. OSA (obstructive sleep apnea)  5. Type 2 DM with CKD stage 3 and hypertension (CMS-HCC)  6. Pure hypercholesterolemia  7. Carotid artery disease, unspecified laterality, unspecified type (CMS-HCC)  8. Cerebrovascular disease  9. Myalgia due to statin  10. S/P cardiac catheterization   71 year old male with multiple  cardiovascular risk factors, history of chest pain with atypical features, and Lexiscan Myoview which revealed mildly left ventricular function with inferoseptal scar without significant ischemia. The patient returned with chief complaint of exertional chest pain and shortness of breath. Patient unable to tolerate nitrates. The patient has essential hypertension, blood pressure well controlled on current BP medications. Repeat Lexiscan Myoview 05/17/2021 revealed LVEF 51% with mild to moderate inferoapical and septal ischemia. The patient underwent cardiac catheterization on 06/08/2021 which revealed chronically occluded mid LAD, 75% stenosis mid to distal PDA, and 75% stenosis PL 2 felt best to be treated medically.   Plan   1. Continue current medications 2. Counseled patient about low-sodium diet 3. DASH diet printed instructions given to the patient 4. Counseled patient about low-cholesterol diet 5. Low-fat and cholesterol diet printed instructions given to the patient 6. Start rosuvastatin 5 mg daily 7. Return to clinic for follow-up in 3 months  No orders of the defined types were placed in this encounter.  Return in about 3 months (around 09/21/2021).   Reproductive/Obstetrics                             Anesthesia Physical Anesthesia Plan  ASA: 2  Anesthesia Plan: General   Post-op Pain Management: Regional block*   Induction: Intravenous  PONV Risk Score and Plan: 2 and Treatment may vary due to age or medical condition, Ondansetron and Dexamethasone  Airway Management Planned: Oral ETT  Additional Equipment:   Intra-op Plan:   Post-operative Plan: Extubation in OR  Informed Consent: I have reviewed the patients History and Physical, chart,  labs and discussed the procedure including the risks, benefits and alternatives for the proposed anesthesia with the patient or authorized representative who has indicated his/her understanding and acceptance.        Plan Discussed with: CRNA  Anesthesia Plan Comments: (LMA/GETA backup discussed.  Patient consented for risks of anesthesia including but not limited to:  - adverse reactions to medications - damage to eyes, teeth, lips or other oral mucosa - nerve damage due to positioning  - sore throat or hoarseness - damage to heart, brain, nerves, lungs, other parts of body or loss of life  Informed patient about role of CRNA in peri- and intra-operative care.  Patient voiced understanding.)        Anesthesia Quick Evaluation

## 2021-11-16 NOTE — Discharge Instructions (Addendum)
AMBULATORY SURGERY  DISCHARGE INSTRUCTIONS  The drugs that you were given will stay in your system until tomorrow so for the next 24 hours you should not:  Drive an automobile Make any legal decisions Drink any alcoholic beverage  You may resume regular meals tomorrow.  Today it is better to start with liquids and gradually work up to solid foods.  You may eat anything you prefer, but it is better to start with liquids, then soup and crackers, and gradually work up to solid foods.  Please notify your doctor immediately if you have any unusual bleeding, trouble breathing, redness and pain at the surgery site, drainage, fever, or pain not relieved by medication.  Your post-operative visit with Dr. Mack Guise                                    is: Date:  Monday, November 27, 2021          Time:  10:30 am (please arrive 15 minutes before scheduled appt.)   Please call to schedule your post-operative visit.  Additional Instructions:         Interscalene Nerve Block with Exparel   For your surgery you have received an Interscalene Nerve Block with Exparel. Nerve Blocks affect many types of nerves, including nerves that control movement, pain and normal sensation.  You may experience feelings such as numbness, tingling, heaviness, weakness or the inability to move your arm or the feeling or sensation that your arm has "fallen asleep". A nerve block with Exparel can last up to 5 days.  Usually the weakness wears off first.  The tingling and heaviness usually wear off next.  Finally you may start to notice pain.  Keep in mind that this may occur in any order.  Once a nerve block starts to wear off it is usually completely gone within 60 minutes. ISNB may cause mild shortness of breath, a hoarse voice, blurry vision, unequal pupils, or drooping of the face on the same side as the nerve block.  These symptoms will usually resolve with the numbness.  Very rarely the procedure itself can cause  mild seizures. If needed, your surgeon will give you a prescription for pain medication.  It will take about 60 minutes for the oral pain medication to become fully effective.  So, it is recommended that you start taking this medication before the nerve block first begins to wear off, or when you first begin to feel discomfort. Take your pain medication only as prescribed.  Pain medication can cause sedation and decrease your breathing if you take more than you need for the level of pain that you have. Nausea is a common side effect of many pain medications.  You may want to eat something before taking your pain medicine to prevent nausea. After an Interscalene nerve block, you cannot feel pain, pressure or extremes in temperature in the effected arm.  Because your arm is numb it is at an increased risk for injury.  To decrease the possibility of injury, please practice the following:  While you are awake change the position of your arm frequently to prevent too much pressure on any one area for prolonged periods of time.  If you have a cast or tight dressing, check the color or your fingers every couple of hours.  Call your surgeon with the appearance of any discoloration (white or blue). If you are given a  sling to wear before you go home, please wear it  at all times until the block has completely worn off.  Do not get up at night without your sling. Please contact [facility] or your surgeon if you do not begin to regain sensation after 7 days from the surgery.  Anesthesia may be contacted by calling the Same Day Surgery Department, Mon. through Fri., 6 am to 4 pm at [phone].   If you experience any other problems or concerns, please contact your surgeon's office. If you experience severe or prolonged shortness of breath go to the nearest emergency department.

## 2021-11-16 NOTE — Op Note (Signed)
PREOPERATIVE H&P  Chief Complaint: Right Shoulder Rotator Cuff Tear  HPI: Michael Humphrey is a 71 y.o. male who presents for preoperative history and physical with a diagnosis of a full-thickness and retracted right Shoulder Rotator Cuff Tear confirmed by MRI. Symptoms of right shoulder pain, weakness and limited range of motion are significantly impairing activities of daily living.  Patient wished to proceed with a surgical repair of the right rotator cuff.  Past Medical History:  Diagnosis Date   Arthritis    CAD (coronary artery disease)    a.) LHC 08/22/2012: normal coronaries; b.) LHC 06/08/2021: EF 55-65%. 30% oLM, 100% p-mLAD, 50% o-pLAD, 60% pLAD, 30% pRCA, 75% RPDA, 75% RPAV -- med mgmt.   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   Diabetic retinopathy (HCC)    DISH (diffuse idiopathic skeletal hyperostosis)    Diverticulitis    Full dentures    GERD (gastroesophageal reflux disease)    High cholesterol    Hordeolum internum right lower eyelid 12/09/2019   Hypertension    Incomplete right bundle branch block (RBBB)    Lumbar radiculitis    a.) L4-L5 decompression   Posterior capsular opacification, 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 Orion Modest recently and sent here   Pulmonary embolism ([place])    Skin cancer    Sleep apnea    a.) unable to tolerate nocturnal PAP therapy   Statin intolerance    T2DM (type 2 diabetes mellitus) ([place])    Tubular adenoma of colon    Past Surgical History:  Procedure Laterality Date   AMPUTATION TOE Left 01/15/2020   Procedure: AMPUTATION TOE MPJ T1;  Surgeon:  Samara Deist, DPM;  Location: [facility];  Service: Podiatry;  Laterality: Left;   BUNIONECTOMY     CATARACT EXTRACTION W/PHACO Left 12/11/2017   Procedure: CATARACT EXTRACTION PHACO AND INTRAOCULAR LENS PLACEMENT ([place])  LEFT DIABETIC;  Surgeon: Leandrew Koyanagi, MD;  Location: [place];  Service: Ophthalmology;  Laterality: Left;  DIABETIC ([facility] picking pt up at 0600, needs 0630 arrival time)   CATARACT EXTRACTION W/PHACO Right 01/15/2018   Procedure: CATARACT EXTRACTION PHACO AND INTRAOCULAR LENS PLACEMENT ([place])  RIGHT DIABETIC  leave so arrival will be around 7:45 ds;  Surgeon: Leandrew Koyanagi, MD;  Location: [place];  Service: Ophthalmology;  Laterality: Right;  Diabetic - oral meds   CHOLECYSTECTOMY     COLONOSCOPY     COLONOSCOPY N/A 08/22/2021   Procedure: COLONOSCOPY;  Surgeon: Lucilla Lame, MD;  Location: [facility] ENDOSCOPY;  Service: Endoscopy;  Laterality: N/A;   COLONOSCOPY WITH PROPOFOL N/A 12/17/2014   Procedure: COLONOSCOPY WITH PROPOFOL;  Surgeon: Lollie Sails, MD;  Location: [facility];  Service: Endoscopy;  Laterality: N/A;   correct hammertoe     FASCIECTOMY Right 10/29/2019   Procedure: PLANTAR FIBROMA RESECTION RIGHT;  Surgeon: Caroline More, DPM;  Location: [place];  Service: Podiatry;  Laterality: Right;  Diabetic - oral meds   JOINT REPLACEMENT     KNEE ARTHROSCOPY Right    LEFT HEART CATH AND CORONARY ANGIOGRAPHY N/A 06/08/2021   Procedure: LEFT HEART CATH AND CORONARY ANGIOGRAPHY;  Surgeon: Saralyn Pilar,  Alexander, MD;  Location: [facility];  Service: Cardiovascular;  Laterality: N/A;   LEFT HEART CATH AND CORONARY ANGIOGRAPHY Left 08/22/2012   Procedure: LEFT HEART CATH AND CORONARY ANGIOGRAPHY; Location: [facility]; Surgeon: Kenneth Fath, MD   LUMBAR LAMINECTOMY/ DECOMPRESSION WITH MET-RX N/A 02/18/2019   Procedure: L4-5 DECOMPRESSION;  Surgeon: Yarbrough, Chester, MD;  Location: [facility];  Service: Neurosurgery;   Laterality: N/A;   TOTAL HIP ARTHROPLASTY Bilateral    TOTAL KNEE ARTHROPLASTY Bilateral    Social History   Socioeconomic History   Marital status: Married    Spouse name: Patricia   Number of children: Not on file   Years of education: Not on file   Highest education level: Not on file  Occupational History   Not on file  Tobacco Use   Smoking status: Never   Smokeless tobacco: Former    Types: Chew    Quit date: 02/08/1978  Vaping Use   Vaping Use: Never used  Substance and Sexual Activity   Alcohol use: Not Currently   Drug use: No   Sexual activity: Not on file  Other Topics Concern   Not on file  Social History Narrative   Not on file   Social Determinants of Health   Financial Resource Strain: Not on file  Food Insecurity: Not on file  Transportation Needs: Not on file  Physical Activity: Not on file  Stress: Not on file  Social Connections: Not on file   Family History  Problem Relation Age of Onset   Clotting disorder Father    Hypertension Father    Allergies  Allergen Reactions   Oxycodone     hallucinations   Clonidine Derivatives     Hallucinations    Fioricet [Butalbital-Apap-Caffeine]     hallucination   Hydrocodone-Acetaminophen     hallucinations   Mobic [Meloxicam]     hallucinations   Norvasc [Amlodipine Besylate]     hallucinations   Statins     Muscle cramps   Tramadol Itching   Prior to Admission medications   Medication Sig Start Date End Date Taking? Authorizing Provider  acetaminophen (TYLENOL) 500 MG tablet Take 1,000 mg by mouth every 6 (six) hours as needed.   Yes [provider]  carvedilol (COREG) 12.5 MG tablet Take 12.5 mg by mouth 2 (two) times daily.  12/04/17  Yes [provider]  finasteride (PROSCAR) 5 MG tablet Take 5 mg by mouth every morning.    Yes [provider]  gabapentin (NEURONTIN) 600 MG tablet Take 600 mg by mouth 2 (two) times daily.   Yes [provider]   glimepiride (AMARYL) 2 MG tablet Take 2 mg by mouth daily with breakfast.   Yes [provider]  lisinopril (PRINIVIL,ZESTRIL) 2.5 MG tablet Take 1 tablet (2.5 mg total) by mouth daily. Patient taking differently: Take 2.5 mg by mouth every morning. 02/28/18  Yes Sainani, Vivek J, MD  methocarbamol (ROBAXIN) 500 MG tablet Take 1 tablet (500 mg total) by mouth every 8 (eight) hours as needed for muscle spasms. 07/16/20  Yes Smith, Dylan, MD  oxyCODONE (OXY IR/ROXICODONE) 5 MG immediate release tablet Take 5 mg by mouth every 6 (six) hours as needed for severe pain.   Yes [provider]  torsemide (DEMADEX) 20 MG tablet Take 20 mg by mouth daily as needed (swelling).   Yes [provider]  traZODone (DESYREL) 100 MG tablet Take 1 tablet (100 mg total) by mouth at bedtime as needed for sleep.   07/17/17 11/16/21 Yes Pyreddy, Reatha Harps, MD  aspirin EC 81 MG tablet Take 81 mg by mouth daily. Swallow whole.    [provider]  metFORMIN (GLUCOPHAGE) 500 MG tablet Take 500 mg by mouth 2 (two) times daily.    [provider]     Positive ROS: All other systems have been reviewed and were otherwise negative with the exception of those mentioned in the HPI and as above.  Physical Exam: General: Alert, no acute distress Cardiovascular: Regular rate and rhythm, no murmurs rubs or gallops.  No pedal edema Respiratory: Clear to auscultation bilaterally, no wheezes rales or rhonchi. No cyanosis, no use of accessory musculature GI: No organomegaly, abdomen is soft and non-tender nondistended with positive bowel sounds. Skin: Skin intact, no lesions within the operative field. Neurologic: Sensation intact distally Psychiatric: Patient is competent for consent with normal mood and affect Lymphatic: No cervical lymphadenopathy  MUSCULOSKELETAL: Right shoulder: Patient can forward elevate to approximately 60 degrees and abduct to approximately 50 to 60 degrees. He continues  to have significant pain and weakness with resisted shoulder abduction and external rotation. He has full digital wrist and elbow range of motion, intact sensation to light touch and palpable radial pulse. He had no weakness to shoulder internal rotation.   Radiology: I reviewed the MRI images as well as the radiology report for the patient's right shoulder MRI. He has a massive rotator cuff tear with retraction of the supra and infraspinatus tendons by approximately 3 to 4 cm. Patient has mild muscle fatty atrophy. He has glenohumeral joint arthropathy with a worn labrum. He has a frayed partially torn subscapularis tendon involving the superior fibers with medial subluxation of the long head of the biceps. He has AC joint arthropathy with humeral head elevation.  Assessment: Right Shoulder Rotator Cuff Tear  Plan: Plan for Procedure(s): RIGHT SHOULDER ARTHROSCOPIC SUBACROMIAL DECOMPRESSION,  DISTAL CLAVICLE EXCISION WITH OPEN ROTATOR CUFF REPAIR  I discussed the risks and benefits of surgery. The risks include but are not limited to infection, bleeding, nerve or blood vessel injury, joint stiffness or loss of motion, persistent pain, weakness or instability, retear of the rotator cuff, failure of the hardware, inability to repair the rotator cuff and the need for further surgery.  Patient understood these risks and wished to proceed.     Thornton Park, MD   11/16/2021 7:44 AM

## 2021-11-16 NOTE — Transfer of Care (Signed)
Immediate Anesthesia Transfer of Care Note  Patient: Michael Humphrey  Procedure(s) Performed: SHOULDER ARTHROSCOPY WITH OPEN ROTATOR CUFF REPAIR AND DISTAL CLAVICLE ACROMINECTOMY (Right: Shoulder)  Patient Location: PACU  Anesthesia Type:GA combined with regional for post-op pain  Level of Consciousness: awake and patient cooperative  Airway & Oxygen Therapy: Patient Spontanous Breathing and Patient connected to face mask oxygen  Post-op Assessment: Report given to RN and Post -op Vital signs reviewed and stable  Post vital signs: Reviewed and stable  Last Vitals:  Vitals Value Taken Time  BP 165/91 11/16/21 1119  Temp    Pulse 62 11/16/21 1123  Resp 17 11/16/21 1123  SpO2 98 % 11/16/21 1123  Vitals shown include unvalidated device data.  Last Pain:  Vitals:   11/16/21 0629  TempSrc: Temporal  PainSc: 9          Complications: No notable events documented.

## 2021-11-16 NOTE — Op Note (Signed)
11/16/2021  11:24 AM  PATIENT:  Michael Humphrey  71 y.o. male  PRE-OPERATIVE DIAGNOSIS: Massive full-thickness and retracted rotator cuff tear, right shoulder  POST-OPERATIVE DIAGNOSIS: Massive full thickness and retracted rotator cuff tear, high-grade partial-thickness tear of the biceps tendon, subacromial impingement and acromioclavicular joint arthrosis  PROCEDURE: Right shoulder arthroscopic biceps tenotomy, subacromial decompression, distal clavicle excision and mini open rotator cuff repair   SURGEON:  Surgeon(s) and Role:    Thornton Park, MD - Primary  ANESTHESIA:   general and paracervical block   PREOPERATIVE INDICATIONS:  Michael Humphrey is a  71 y.o. male with a diagnosis of massive right Shoulder Rotator Cuff Tear with retraction confirmed by MRI.  Patient has significant right shoulder pain, weakness and limited range of motion.  He wished to proceed with surgical fixation of his rotator cuff tear. The risks benefits and alternatives were discussed with the patient preoperatively including but not limited to the risks of infection, bleeding, nerve injury, persistent pain or weakness, failure of the hardware, re-tear of the rotator cuff and the need for further surgery. Patient understood these risks and wished to proceed.  OPERATIVE IMPLANTS: [place] MultiFix anchors x 2 & Smith and Nephew Q Fix anchors x 2  OPERATIVE PROCEDURE: The patient was met in the preoperative area. The right shoulder was signed with the word yes and my initials according the hospital's correct site of surgery protocol.  A preop history and physical was performed at the bedside.  Patient underwent placement of an interscalene block with Exparel by the anesthesia service in the preoperative area.  The patient was then brought to the OR and underwent general endotracheal intubation by the anesthesia service.  The patient was placed in a beachchair position.  A spider arm  positioner was used for this case. Examination under anesthesia revealed no right shoulder instability.  Patient had mild loss of passive range of motion.  Patient had forward elevation to proximal 140 degrees and external rotation and abduction of approximately 70 to 80 degrees.  The patient was prepped and draped in a sterile fashion. A timeout was performed to verify the patient's name, date of birth, medical record number, correct site of surgery and correct procedure to be performed there was also used to verify the patient received antibiotics that all appropriate instruments, implants and radiographs studies were available in the room.  Patient received 2 g of Ancef prior to the onset of the case.  Once all in attendance were in agreement case began.  Bony landmarks were drawn out with a surgical marker along with proposed arthroscopy incisions. These were pre-injected with 1% lidocaine plain. An 11 blade was used to establish a posterior portal through which the arthroscope was placed in the glenohumeral joint. A full diagnostic examination of the shoulder was performed.  The anterior portal was established under direct visualization with an 18-gauge spinal needle.  A 5.75 mm arthroscopic cannula was placed through the anterior portal.   The intra-articular portion of the biceps tendon was found to have a high-grade partial tear involving greater than 50% of the diameter.  There was extensive diffuse fraying of the tendon.  Given the amount of fraying it was felt the patient would not be a candidate for a tenodesis and therefore the decision was made to perform a tenotomy.  A Smith & Nephew 90 degree werewolf wand was used to release the biceps tendon off the superior labrum. The arthroscopic shaver was then used  to debride the frayed edges of the labrum.  Patient had a SLAP tear, which did not require fixation after his biceps tenotomy.   No anterior labral tear was seen.    The subscapularis tendon  had a partial tear involving the superior fibers without full-thickness tear or retraction.  The biceps tendon with medially subluxated. Patient had a full-thickness tear involving the entirety of the the supraspinatus and infraspinatus with retraction.  There is a tear within the substance of the rotator cuff as well as detachment from the greater tuberosity.  There were no loose bodies within the inferior recess and no evidence of HAGL lesion.  The arthroscope was then placed in the subacromial space. A lateral portal was then established using an 18-gauge spinal needle for localization.   The greater tuberosity was debrided using a 4.5 mm Dyonics bone cutter shaver blade to remove all remaining torn fibers of the rotator cuff.  Debridement was performed until punctate bleeding was seen at the greater tuberosity footprint, which will allow for rotator cuff healing.  Extensive bursitis was encountered and debrided using a Dyonics tapered shaver blade and a [place] werewolf wand from the lateral portal. A subacromial decompression was also performed using a 4.5 mm bone cutter shaver blade from the lateral portal.  The same shaver blade was then placed through the anterior portal and distal clavicle excision was performed.  Three  Perfect Pass sutures were placed in the lateral border of the rotator cuff tear. All arthroscopic instruments were then removed and the mini-open portion of the procedure began.   A saber-type incision was made along the lateral border of the acromion. The deltoid muscle was identified and split in line with its fibers which allowed visualization of the rotator cuff.  The Perfect Pass sutures previously placed in the lateral border of the rotator cuff were also brought out through the deltoid split.   Two Q-Fix anchors were then placed at the articular margin of the humeral head and greater tuberosity. The four suture limbs of two Q Fix anchors were passed medially through  the rotator cuff using a first pass suture passer.  Two additional perfect pass sutures were used to perform a side-to-side repair of the intrasubstance tear.  Two additional perfect pass sutures were placed in the lateral border of the rotator cuff.  The Perfect Pass sutures from the lateral border of the rotator cuff were then anchored to the greater tuberosity of the humeral head using two [place] MultiFix anchors. These sutures were tensioned to allow for anatomic reduction of the rotator cuff to the greater tuberosity footprint.  The medial row repair was then completed using an arthroscopic knot tying technique with the Q fix anchor sutures.  Once all sutures were tied down, arthroscopic images of the double row repair were taken with the arthroscope both externally and arthroscopically from the glenohumeral joint  All incisions were copiously irrigated. The deltoid fascia was repaired using a 0 Vicryl suturean interrupted fashion.  The subcutaneous tissue of all incisions were closed with a 2-0 Vicryl. Skin closure for the arthroscopic incisions was performed with 4-0 nylon. The skin edges of the saber incision were approximated with a running 4-0 undyed Monocryl.  A dry sterile dressing including Steri-Strips was applied .  The patient was placed in an abduction sling, with a Polar Care sleeve.  All sharp and instrument counts were correct at the conclusion of the case. I was scrubbed and present for  the entire case. I spoke with the patient's wife in the post-op consultation room and informed her that the case had been performed without complication and the patient was stable in recovery room.     Timoteo Gaul, MD

## 2021-11-20 NOTE — Anesthesia Postprocedure Evaluation (Signed)
Anesthesia Post Note  Patient: Michael Humphrey  Procedure(s) Performed: SHOULDER ARTHROSCOPY WITH OPEN ROTATOR CUFF REPAIR AND DISTAL CLAVICLE ACROMINECTOMY (Right: Shoulder)  Patient location during evaluation: PACU Anesthesia Type: General Level of consciousness: awake and alert Pain management: pain level controlled Vital Signs Assessment: post-procedure vital signs reviewed and stable Respiratory status: spontaneous breathing, nonlabored ventilation, respiratory function stable and patient connected to nasal cannula oxygen Cardiovascular status: blood pressure returned to baseline and stable Postop Assessment: no apparent nausea or vomiting Anesthetic complications: no   No notable events documented.   Last Vitals:  Vitals:   11/16/21 1309 11/16/21 1355  BP: (!) 145/80 (!) 164/97  Pulse: 66 68  Resp: 16 17  Temp: (!) 36.2 C   SpO2: 95% 95%    Last Pain:  Vitals:   11/16/21 1355  TempSrc:   PainSc: 0-No pain                 Martha Clan

## 2021-11-28 ENCOUNTER — Encounter (INDEPENDENT_AMBULATORY_CARE_PROVIDER_SITE_OTHER): Payer: PPO | Admitting: Ophthalmology

## 2021-11-29 ENCOUNTER — Ambulatory Visit: Payer: PPO

## 2021-11-29 DIAGNOSIS — M25511 Pain in right shoulder: Secondary | ICD-10-CM | POA: Diagnosis not present

## 2021-11-29 DIAGNOSIS — M25611 Stiffness of right shoulder, not elsewhere classified: Secondary | ICD-10-CM | POA: Diagnosis not present

## 2021-12-01 DIAGNOSIS — M25611 Stiffness of right shoulder, not elsewhere classified: Secondary | ICD-10-CM | POA: Diagnosis not present

## 2021-12-01 DIAGNOSIS — M25511 Pain in right shoulder: Secondary | ICD-10-CM | POA: Diagnosis not present

## 2021-12-04 DIAGNOSIS — M25511 Pain in right shoulder: Secondary | ICD-10-CM | POA: Diagnosis not present

## 2021-12-04 DIAGNOSIS — M25611 Stiffness of right shoulder, not elsewhere classified: Secondary | ICD-10-CM | POA: Diagnosis not present

## 2021-12-08 DIAGNOSIS — M25611 Stiffness of right shoulder, not elsewhere classified: Secondary | ICD-10-CM | POA: Diagnosis not present

## 2021-12-08 DIAGNOSIS — M25511 Pain in right shoulder: Secondary | ICD-10-CM | POA: Diagnosis not present

## 2021-12-13 DIAGNOSIS — M25611 Stiffness of right shoulder, not elsewhere classified: Secondary | ICD-10-CM | POA: Diagnosis not present

## 2021-12-13 DIAGNOSIS — M25511 Pain in right shoulder: Secondary | ICD-10-CM | POA: Diagnosis not present

## 2021-12-15 DIAGNOSIS — M25611 Stiffness of right shoulder, not elsewhere classified: Secondary | ICD-10-CM | POA: Diagnosis not present

## 2021-12-15 DIAGNOSIS — M25511 Pain in right shoulder: Secondary | ICD-10-CM | POA: Diagnosis not present

## 2021-12-18 DIAGNOSIS — M25611 Stiffness of right shoulder, not elsewhere classified: Secondary | ICD-10-CM | POA: Diagnosis not present

## 2021-12-18 DIAGNOSIS — M25511 Pain in right shoulder: Secondary | ICD-10-CM | POA: Diagnosis not present

## 2021-12-20 DIAGNOSIS — M25511 Pain in right shoulder: Secondary | ICD-10-CM | POA: Diagnosis not present

## 2021-12-20 DIAGNOSIS — M25611 Stiffness of right shoulder, not elsewhere classified: Secondary | ICD-10-CM | POA: Diagnosis not present

## 2021-12-22 DIAGNOSIS — E1122 Type 2 diabetes mellitus with diabetic chronic kidney disease: Secondary | ICD-10-CM | POA: Diagnosis not present

## 2021-12-22 DIAGNOSIS — E7800 Pure hypercholesterolemia, unspecified: Secondary | ICD-10-CM | POA: Diagnosis not present

## 2021-12-22 DIAGNOSIS — N183 Chronic kidney disease, stage 3 unspecified: Secondary | ICD-10-CM | POA: Diagnosis not present

## 2021-12-22 DIAGNOSIS — I10 Essential (primary) hypertension: Secondary | ICD-10-CM | POA: Diagnosis not present

## 2021-12-22 DIAGNOSIS — I129 Hypertensive chronic kidney disease with stage 1 through stage 4 chronic kidney disease, or unspecified chronic kidney disease: Secondary | ICD-10-CM | POA: Diagnosis not present

## 2021-12-26 DIAGNOSIS — M25511 Pain in right shoulder: Secondary | ICD-10-CM | POA: Diagnosis not present

## 2021-12-26 DIAGNOSIS — M25611 Stiffness of right shoulder, not elsewhere classified: Secondary | ICD-10-CM | POA: Diagnosis not present

## 2021-12-28 DIAGNOSIS — M25611 Stiffness of right shoulder, not elsewhere classified: Secondary | ICD-10-CM | POA: Diagnosis not present

## 2021-12-28 DIAGNOSIS — M25511 Pain in right shoulder: Secondary | ICD-10-CM | POA: Diagnosis not present

## 2021-12-29 DIAGNOSIS — I10 Essential (primary) hypertension: Secondary | ICD-10-CM | POA: Diagnosis not present

## 2021-12-29 DIAGNOSIS — T466X5A Adverse effect of antihyperlipidemic and antiarteriosclerotic drugs, initial encounter: Secondary | ICD-10-CM | POA: Diagnosis not present

## 2021-12-29 DIAGNOSIS — I129 Hypertensive chronic kidney disease with stage 1 through stage 4 chronic kidney disease, or unspecified chronic kidney disease: Secondary | ICD-10-CM | POA: Diagnosis not present

## 2021-12-29 DIAGNOSIS — R059 Cough, unspecified: Secondary | ICD-10-CM | POA: Diagnosis not present

## 2021-12-29 DIAGNOSIS — M791 Myalgia, unspecified site: Secondary | ICD-10-CM | POA: Diagnosis not present

## 2021-12-29 DIAGNOSIS — I779 Disorder of arteries and arterioles, unspecified: Secondary | ICD-10-CM | POA: Diagnosis not present

## 2021-12-29 DIAGNOSIS — N183 Chronic kidney disease, stage 3 unspecified: Secondary | ICD-10-CM | POA: Diagnosis not present

## 2021-12-29 DIAGNOSIS — R058 Other specified cough: Secondary | ICD-10-CM | POA: Diagnosis not present

## 2021-12-29 DIAGNOSIS — E1122 Type 2 diabetes mellitus with diabetic chronic kidney disease: Secondary | ICD-10-CM | POA: Diagnosis not present

## 2021-12-29 DIAGNOSIS — I700 Atherosclerosis of aorta: Secondary | ICD-10-CM | POA: Diagnosis not present

## 2021-12-29 DIAGNOSIS — Z0000 Encounter for general adult medical examination without abnormal findings: Secondary | ICD-10-CM | POA: Diagnosis not present

## 2022-01-04 DIAGNOSIS — M25611 Stiffness of right shoulder, not elsewhere classified: Secondary | ICD-10-CM | POA: Diagnosis not present

## 2022-01-04 DIAGNOSIS — M25511 Pain in right shoulder: Secondary | ICD-10-CM | POA: Diagnosis not present

## 2022-01-05 ENCOUNTER — Encounter: Payer: Self-pay | Admitting: Orthopedic Surgery

## 2022-01-05 DIAGNOSIS — M5126 Other intervertebral disc displacement, lumbar region: Secondary | ICD-10-CM | POA: Diagnosis not present

## 2022-01-05 DIAGNOSIS — M5416 Radiculopathy, lumbar region: Secondary | ICD-10-CM | POA: Diagnosis not present

## 2022-01-05 DIAGNOSIS — M48062 Spinal stenosis, lumbar region with neurogenic claudication: Secondary | ICD-10-CM | POA: Diagnosis not present

## 2022-01-16 NOTE — Progress Notes (Signed)
Referring Physician:  No referring provider defined for this encounter.  Primary Physician:  Kirk Ruths, MD  History of Present Illness: 01/18/2022 Michael Humphrey has a history of DM, HTN, CKD 3, CVA, OSA, chronic venous stasis, history of PE, MI, CAD, and BPH.   DOS 02/18/19 L4-L5 decompression by Dr. Izora Ribas  Last seen by Dr. Izora Ribas on 03/26/19 and he was doing great.  18 months of constant LBP with left buttock and posterior thigh pain to his knee. Pain got worse after he did PT for his shoulder. No right sided back or leg pain. Pain is worse with standing and walking. He has trouble getting up from seated position. No numbness or tingling in left leg. He has weakness in left leg. No bowel or bladder issues.   He had 3-4 days of relief with ESI on 01/05/21. Previous injections helped for a few months.    Conservative measures:  Physical therapy: recent PT for his shoulders. No recent PT for his back.  Multimodal medical therapy including regular antiinflammatories: neurontin, tylenol, flexeril   Injections:  01/05/2021: Bilateral S1 transforaminal ESI (Celestone 12 mg) 03/28/2021: Bilateral S1 transforaminal ESI (good relief, Celestone '12mg'$ ) 12/15/2020: Bilateral S1 transforaminal ESI (moderate to good relief, Celestone '12mg'$ ) 10/07/2018: Bilateral S1 transforaminal ESI (good relief, Celestone 12 mg) 06/24/2018: Bilateral S1 transforaminal ESI (good relief, Celestone 12 mg) 01/08/2018: Bilateral S1 transforaminal ESI (good relief, Celestone 12 mg) 10/23/2017: Left S1 transforaminal ESI (moderate to good relief) 12/22/2015: Right S1 transforaminal ESI 08/23/2015: Bilateral S1 transforaminal ESI 06/08/2015: Bilateral S1 transforaminal ESI 11/23/2014: Bilateral S1 transforaminal ESI 06/11/2014: Bilateral S1 transforaminal ESI 05/07/2014: Bilateral S1 transforaminal ESI 12/10/2013: Bilateral S1 transforaminal ESI   Past Surgery: L4-L5 decompression by Dr. Izora Ribas  on 02/18/19  Michael Humphrey has no symptoms of cervical myelopathy.  The symptoms are causing a significant impact on the patient's life.   Review of Systems:  A 10 point review of systems is negative, except for the pertinent positives and negatives detailed in the HPI.  Past Medical History: Past Medical History:  Diagnosis Date   Arthritis    CAD (coronary artery disease)    a.) LHC 08/22/2012: normal coronaries; b.) LHC 06/08/2021: EF 55-65%. 30% oLM, 100% p-mLAD, 50% o-pLAD, 60% pLAD, 30% pRCA, 75% RPDA, 75% RPAV -- med mgmt.   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   Diabetic retinopathy (HCC)    DISH (diffuse idiopathic skeletal hyperostosis)    Diverticulitis    Full dentures    GERD (gastroesophageal reflux disease)    Heart attack ([place])    High cholesterol    Hordeolum internum right lower eyelid 12/09/2019   Hypertension    Incomplete right bundle branch block (RBBB)    Lumbar radiculitis    a.) L4-L5 decompression   Posterior capsular opacification, 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 Orion Modest recently and sent here   Pulmonary embolism ([place])    Skin cancer    Sleep apnea    a.) unable to tolerate nocturnal PAP therapy   Statin intolerance    T2DM (type 2 diabetes mellitus) ([place])    Tubular  adenoma of colon     Past Surgical History: Past Surgical History:  Procedure Laterality Date   AMPUTATION TOE Left 01/15/2020   Procedure: AMPUTATION TOE MPJ T1;  Surgeon: Samara Deist, DPM;  Location: [facility];  Service: Podiatry;  Laterality: Left;   BUNIONECTOMY     CATARACT EXTRACTION  W/PHACO Left 12/11/2017   Procedure: CATARACT EXTRACTION PHACO AND INTRAOCULAR LENS PLACEMENT ([place])  LEFT DIABETIC;  Surgeon: Leandrew Koyanagi, MD;  Location: [place];  Service: Ophthalmology;  Laterality: Left;  DIABETIC ([facility] picking pt up at 0600, needs 0630 arrival time)   CATARACT EXTRACTION W/PHACO Right 01/15/2018   Procedure: CATARACT EXTRACTION PHACO AND INTRAOCULAR LENS PLACEMENT ([place])  RIGHT DIABETIC  leave so arrival will be around 7:45 ds;  Surgeon: Leandrew Koyanagi, MD;  Location: [place];  Service: Ophthalmology;  Laterality: Right;  Diabetic - oral meds   CHOLECYSTECTOMY     COLONOSCOPY     COLONOSCOPY N/A 08/22/2021   Procedure: COLONOSCOPY;  Surgeon: Lucilla Lame, MD;  Location: [facility];  Service: Endoscopy;  Laterality: N/A;   COLONOSCOPY WITH PROPOFOL N/A 12/17/2014   Procedure: COLONOSCOPY WITH PROPOFOL;  Surgeon: Lollie Sails, MD;  Location: [facility] ENDOSCOPY;  Service: Endoscopy;  Laterality: N/A;   correct hammertoe     FASCIECTOMY Right 10/29/2019   Procedure: PLANTAR FIBROMA RESECTION RIGHT;  Surgeon: Caroline More, DPM;  Location: [place];  Service: Podiatry;  Laterality: Right;  Diabetic - oral meds   JOINT REPLACEMENT     KNEE ARTHROSCOPY Right    LEFT HEART CATH AND CORONARY ANGIOGRAPHY N/A 06/08/2021   Procedure: LEFT HEART CATH AND CORONARY ANGIOGRAPHY;  Surgeon: Isaias Cowman, MD;  Location: [facility];  Service: Cardiovascular;  Laterality: N/A;   LEFT HEART CATH AND CORONARY ANGIOGRAPHY Left 08/22/2012   Procedure: LEFT HEART CATH AND CORONARY ANGIOGRAPHY; Location: [place]; Surgeon: Bartholome Bill, MD   LUMBAR LAMINECTOMY/ DECOMPRESSION WITH MET-RX N/A 02/18/2019   Procedure: L4-5 DECOMPRESSION;  Surgeon: Meade Maw, MD;  Location: [facility];  Service: Neurosurgery;  Laterality: N/A;   SHOULDER ARTHROSCOPY WITH OPEN ROTATOR CUFF REPAIR AND DISTAL CLAVICLE ACROMINECTOMY Right 11/16/2021    Procedure: SHOULDER ARTHROSCOPY WITH OPEN ROTATOR CUFF REPAIR AND DISTAL CLAVICLE ACROMINECTOMY;  Surgeon: Thornton Park, MD;  Location: [facility];  Service: Orthopedics;  Laterality: Right;   TOTAL HIP ARTHROPLASTY Bilateral    TOTAL KNEE ARTHROPLASTY Bilateral     Allergies: Allergies as of 01/18/2022 - Review Complete 01/18/2022  Allergen Reaction Noted   Clonidine derivatives  12/16/2014   Fioricet [butalbital-apap-caffeine]  12/16/2014   Hydrocodone-acetaminophen  12/16/2014   Mobic [meloxicam]  12/16/2014   Norvasc [amlodipine besylate]  12/16/2014   Statins  12/16/2014   Tramadol Itching 12/04/2016    Medications: Outpatient Encounter Medications as of 01/18/2022  Medication Sig   cyclobenzaprine (FLEXERIL) 5 MG tablet Take 5 mg by mouth 3 (three) times daily as needed.   acetaminophen (TYLENOL) 500 MG tablet Take 1,000 mg by mouth every 6 (six) hours as needed.   aspirin EC 81 MG tablet Take 81 mg by mouth daily. Swallow whole.   carvedilol (COREG) 12.5 MG tablet Take 12.5 mg by mouth 2 (two) times daily.    finasteride (PROSCAR) 5 MG tablet Take 5 mg by mouth every morning.    gabapentin (NEURONTIN) 600 MG tablet Take 600 mg by mouth 2 (two) times daily.   glimepiride (AMARYL) 2 MG tablet Take 2 mg by mouth daily with breakfast.   lisinopril (  PRINIVIL,ZESTRIL) 2.5 MG tablet Take 1 tablet (2.5 mg total) by mouth daily. (Patient taking differently: Take 2.5 mg by mouth every morning.)   metFORMIN (GLUCOPHAGE) 500 MG tablet Take 500 mg by mouth 2 (two) times daily.   ondansetron (ZOFRAN) 4 MG tablet Take 1 tablet (4 mg total) by mouth every 8 (eight) hours as needed for nausea or vomiting.   torsemide (DEMADEX) 20 MG tablet Take 20 mg by mouth daily as needed (swelling).   traZODone (DESYREL) 100 MG tablet Take 1 tablet (100 mg total) by mouth at bedtime as needed for sleep.   [DISCONTINUED] methocarbamol (ROBAXIN) 500 MG tablet Take 1 tablet (500 mg total) by mouth every 8  (eight) hours as needed for muscle spasms.   [DISCONTINUED] oxyCODONE (OXY IR/ROXICODONE) 5 MG immediate release tablet Take 1 tablet (5 mg total) by mouth every 6 (six) hours as needed for severe pain.   No facility-administered encounter medications on file as of 01/18/2022.    Social History: Social History   Tobacco Use   Smoking status: Never   Smokeless tobacco: Former    Types: Chew    Quit date: 02/08/1978  Vaping Use   Vaping Use: Never used  Substance Use Topics   Alcohol use: Not Currently   Drug use: No    Family Medical History: Family History  Problem Relation Age of Onset   Clotting disorder Father    Hypertension Father     Physical Examination: Vitals:   01/18/22 1344  BP: (!) 181/114  Pulse: 82    General: Patient is well developed, well nourished, calm, collected, and in no apparent distress. Attention to examination is appropriate.  Respiratory: Patient is breathing without any difficulty.  NEUROLOGICAL:     Awake, alert, oriented to person, place, and time.  Speech is clear and fluent. Fund of knowledge is appropriate.   Cranial Nerves: Pupils equal round and reactive to light.  Facial tone is symmetric.    Incision is well healed. No lower lumbar tenderness.   No abnormal lesions on exposed skin.   Strength: Side Biceps Triceps Deltoid Interossei Grip Wrist Ext. Wrist Flex.  R '5 5 5 5 5 5 5  '$ L '5 5 5 5 5 5 5   '$ Side Iliopsoas Quads Hamstring PF DF EHL  R '5 5 5 5 5 5  '$ L '5 5 5 5 5 5   '$ Reflexes are 2+ and symmetric at the biceps, triceps, brachioradialis, patella and achilles.   Hoffman's is absent.  Clonus is not present.   Bilateral upper and lower extremity sensation is intact to light touch.     He has arthritic changes in both hands.   Abnormal gait that is slow and shuffling. He uses a cane to ambulate.    Medical Decision Making  Imaging: Lumbar MRI dated 12/17/18:  FINDINGS: Segmentation:  Normal   Alignment: Mild  retrolisthesis L2-3, L3-4, L4-5 unchanged. Mild anterolisthesis L5-S1 unchanged.   Vertebrae:  Negative for fracture or mass.   Conus medullaris and cauda equina: Conus extends to the L1-2 level. Conus and cauda equina appear normal.   Paraspinal and other soft tissues: Negative for paraspinous mass or adenopathy. No soft tissue fluid collection   Disc levels:   L1-2: Mild disc and facet degeneration. Negative for stenosis.   L2-3: Mild disc bulging. Bilateral facet hypertrophy. Mild narrowing of the canal unchanged.   L3-4: Diffuse disc bulging and endplate spurring unchanged. Moderate facet hypertrophy. Mild spinal stenosis and moderate subarticular  stenosis bilaterally unchanged from the prior study.   L4-5: Disc degeneration with disc bulging and endplate spurring. Discogenic edema in the bone marrow with mild progression. Moderate facet hypertrophy bilaterally. Mild spinal stenosis. Moderate to severe subarticular and foraminal stenosis bilaterally unchanged.   L5-S1: Advanced disc degeneration with disc space narrowing and diffuse endplate spurring. Severe right foraminal encroachment with impingement of the right L5 nerve root unchanged. Mild left foraminal stenosis unchanged.   IMPRESSION: 1. Multilevel lumbar degenerative changes are similar to the prior study. 2. Mild spinal stenosis L3-4 with moderate subarticular stenosis bilaterally unchanged. 3. Moderate to severe subarticular and foraminal stenosis bilaterally L4-5 unchanged. 4. Severe right foraminal encroachment L5-S1 with impingement of the right L5 nerve root unchanged.     Electronically Signed   By: Franchot Gallo M.D.   On: 12/17/2018 20:48    I have personally reviewed the images and agree with the above interpretation.  Assessment and Plan: Michael Humphrey is a pleasant 72 y.o. male with history of L4-L5 decompression by Dr. Izora Ribas on 02/18/19.   18 months of constant LBP with left buttock and  posterior thigh pain to his knee that is worse with standing and walking. No right sided back or leg pain.   He has known mild central stenosis L3-L4 with moderate bilateral foraminal stenosis, mild central stenosis L4-L5 with moderate/severe bilateral foraminal stenosis, and severe right foraminal stenosis L5-S1. Last MRI was in 2020.   Treatment options discussed with patient and following plan made:   - MRI of lumbar spine to evaluate for worsening spinal stenosis. No improvement with time or medications.  - No improvement with previous PT- he does not want to revisit.  - Will review lumbar MRI with Dr. Izora Ribas- he is interested in surgery options. We discussed he would need to do PT prior to any surgery.  - Will set him up for phone visit with me to review MRI results once I have them back.  - He has abnormal gait- slow and shuffling. No other signs/symptoms of myelopathy noted.   BP was elevated, it was 189/107 at start of visit. BP at end of visit was 170/106. No symptoms of chest pain, shortness of breath, blurry vision, or headaches. 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   His wife checks BP at home and it generally runs 120-140/80s. Will recheck at home and call PCP if not improved. If he develops CP, SOB, blurry vision, or headaches, then he will go to ED.    I spent a total of 40 minutes in face-to-face and non-face-to-face activities related to this patient's care today including review of outside records, review of imaging, review of symptoms, physical exam, discussion of differential diagnosis, discussion of treatment options, and documentation.   Thank you for involving me in the care of this patient.   Geronimo Boot PA-C Dept. of Neurosurgery

## 2022-01-18 ENCOUNTER — Ambulatory Visit (INDEPENDENT_AMBULATORY_CARE_PROVIDER_SITE_OTHER): Payer: PPO | Admitting: Orthopedic Surgery

## 2022-01-18 ENCOUNTER — Encounter: Payer: Self-pay | Admitting: Orthopedic Surgery

## 2022-01-18 VITALS — BP 170/106 | HR 82 | Ht 69.0 in | Wt 192.0 lb

## 2022-01-18 DIAGNOSIS — M5136 Other intervertebral disc degeneration, lumbar region: Secondary | ICD-10-CM

## 2022-01-18 DIAGNOSIS — M48061 Spinal stenosis, lumbar region without neurogenic claudication: Secondary | ICD-10-CM

## 2022-01-18 DIAGNOSIS — M5416 Radiculopathy, lumbar region: Secondary | ICD-10-CM

## 2022-01-18 NOTE — Patient Instructions (Signed)
It was so nice to see you today, I am sorry that you are hurting so much.   Your MRI from 2020 showed wear and tear in your back (arthritis).   I want to get an updated MRI of your lower back to look into things further. We will get this approved through your insurance and [facility] will call you to schedule the MRI.   Once I have the MRI results, I will review with Dr. Izora Ribas. We will call you to schedule a phone visit to review the results.   Your blood pressure was elevated today, it was 170/106. I want you to recheck it at home and follow up with your PCP if it remains high. If you have any chest pain, shortness of breath, blurry vision, or headaches then you need to go to ED.    Please do not hesitate to call if you have any questions or concerns. You can also message me in [place].   If you have not heard back about any of the tests/procedures in the next week, please call the office so we can help you get these things scheduled.   Geronimo Boot PA-C [phone]

## 2022-01-23 ENCOUNTER — Encounter: Payer: Self-pay | Admitting: Orthopedic Surgery

## 2022-01-23 ENCOUNTER — Ambulatory Visit
Admission: RE | Admit: 2022-01-23 | Discharge: 2022-01-23 | Disposition: A | Discharge status: Home / Self Care | Payer: PPO | Source: Ambulatory Visit | Attending: Orthopedic Surgery | Admitting: Orthopedic Surgery

## 2022-01-23 DIAGNOSIS — M5136 Other intervertebral disc degeneration, lumbar region: Secondary | ICD-10-CM | POA: Insufficient documentation

## 2022-01-23 DIAGNOSIS — M5416 Radiculopathy, lumbar region: Secondary | ICD-10-CM | POA: Diagnosis not present

## 2022-01-23 DIAGNOSIS — M48061 Spinal stenosis, lumbar region without neurogenic claudication: Secondary | ICD-10-CM | POA: Insufficient documentation

## 2022-01-23 DIAGNOSIS — M545 Low back pain, unspecified: Secondary | ICD-10-CM | POA: Diagnosis not present

## 2022-01-23 NOTE — Progress Notes (Signed)
MRI lumbar spine dated 01/23/22:  FINDINGS: Segmentation: Standard; the lowest formed disc space is designated L5-S1.   Alignment: Grade 1 anterolisthesis of L5 on S1 and mild dextrocurvature are unchanged. Alignment is otherwise normal.   Vertebrae: Vertebral body heights are preserved. Background marrow signal is normal. There is degenerative endplate marrow signal abnormality with faint edema at L4-L5, similar to 2020. There is no suspicious marrow signal abnormality or other marrow edema.   Conus medullaris and cauda equina: Conus extends to the L1 level. Conus and cauda equina appear normal.   Paraspinal and other soft tissues: Unremarkable.   Disc levels:   There is advanced disc desiccation and narrowing at L4-L5 and L5-S1 and mild disc degeneration elsewhere in the lumbar spine, overall similar to 2020.   T12-L1: No significant spinal canal or neural foraminal stenosis   L1-L2: Mild facet arthropathy without significant spinal canal or neural foraminal stenosis   L2-L3: There is a mild disc bulge and right worse than left facet arthropathy without significant spinal canal or neural foraminal stenosis, not significantly changed.   L3-L4: There is a diffuse disc bulge and moderate bilateral facet arthropathy with ligamentum flavum thickening resulting in mild spinal canal stenosis with bilateral subarticular zone narrowing but no evidence of frank nerve root impingement, and mild bilateral neural foraminal stenosis unchanged   L4-L5: There is a diffuse disc bulge, degenerative endplate spurring, and moderate to advanced bilateral facet arthropathy resulting in bilateral subarticular zone narrowing without evidence of frank nerve root impingement, and severe left worse than right neural foraminal stenosis, unchanged   L5-S1: There is a diffuse disc bulge and moderate bilateral facet arthropathy resulting severe right and moderate left neural foraminal stenosis  without significant spinal canal stenosis, unchanged.   IMPRESSION: 1. Multilevel degenerative changes in the lumbar spine are overall not significantly changed since 2020. No acute finding. 2. Mild spinal canal and bilateral subarticular zone narrowing and mild bilateral neural foraminal stenosis at L3-L4. 3. Bilateral subarticular zone narrowing and severe left worse than right neural foraminal stenosis at L4-L5. 4. Severe right and moderate left neural foraminal stenosis at L5-S1. 5. Moderate to severe facet arthropathy at L4-L5.     Electronically Signed   By: Valetta Mole M.D.   On: 01/23/2022 15:01  Above MRI reviewed with Dr. Izora Ribas. His symptoms may be due to neural compression at L4 and L5. He recommends PT and following up with him in 6-8 weeks. Possible surgical plan would depend on patient's symptoms.   Will set up phone visit with him to discuss.

## 2022-01-24 ENCOUNTER — Telehealth (INDEPENDENT_AMBULATORY_CARE_PROVIDER_SITE_OTHER): Payer: PPO | Admitting: Orthopedic Surgery

## 2022-01-24 DIAGNOSIS — M5136 Other intervertebral disc degeneration, lumbar region: Secondary | ICD-10-CM | POA: Diagnosis not present

## 2022-01-24 DIAGNOSIS — M48061 Spinal stenosis, lumbar region without neurogenic claudication: Secondary | ICD-10-CM

## 2022-01-24 NOTE — Progress Notes (Signed)
Telephone Visit- Progress Note: Referring Physician:  No referring provider defined for this encounter.  Primary Physician:  Kirk Ruths, MD  This visit was performed via telephone.  Patient location: home Provider location: office  I spent a total of 15 minutes non-face-to-face activities for this visit on the date of this encounter including review of current clinical condition and response to treatment.    Patient has given verbal consent to this telephone visits and we reviewed the limitations of a telephone visit. Patient wishes to proceed.    Chief Complaint:  review of lumbar MRI  History of Present Illness: Michael Humphrey is a 72 y.o. male has a history of DM, HTN, CKD 3, CVA, OSA, chronic venous stasis, history of PE, MI, CAD, and BPH.    DOS 02/18/19 L4-L5 decompression by Dr. Izora Ribas.   He continues with constant LBP with left buttock and posterior thigh pain to his knee. Pain is worse with standing and walking. He has trouble getting up from seated position. He has weakness in left leg.   He had bilateral S1 TF ESI on 01/05/21 with only 3-4 days of relief.   Phone visit scheduled to review his lumbar MRI.   Conservative measures:  Physical therapy: recent PT for his shoulders. No recent PT for his back.  Multimodal medical therapy including regular antiinflammatories: neurontin, tylenol, flexeril   Injections:  01/05/2021: Bilateral S1 transforaminal ESI (Celestone 12 mg) 03/28/2021: Bilateral S1 transforaminal ESI (good relief, Celestone '12mg'$ ) 12/15/2020: Bilateral S1 transforaminal ESI (moderate to good relief, Celestone '12mg'$ ) 10/07/2018: Bilateral S1 transforaminal ESI (good relief, Celestone 12 mg) 06/24/2018: Bilateral S1 transforaminal ESI (good relief, Celestone 12 mg) 01/08/2018: Bilateral S1 transforaminal ESI (good relief, Celestone 12 mg) 10/23/2017: Left S1 transforaminal ESI (moderate to good relief) 12/22/2015: Right S1  transforaminal ESI 08/23/2015: Bilateral S1 transforaminal ESI 06/08/2015: Bilateral S1 transforaminal ESI 11/23/2014: Bilateral S1 transforaminal ESI 06/11/2014: Bilateral S1 transforaminal ESI 05/07/2014: Bilateral S1 transforaminal ESI 12/10/2013: Bilateral S1 transforaminal ESI    Past Surgery: L4-L5 decompression by Dr. Izora Ribas on 02/18/19  Exam: No exam done as this was a telephone encounter.     Imaging: Lumbar MRI dated 01/23/22:  FINDINGS: Segmentation: Standard; the lowest formed disc space is designated L5-S1.   Alignment: Grade 1 anterolisthesis of L5 on S1 and mild dextrocurvature are unchanged. Alignment is otherwise normal.   Vertebrae: Vertebral body heights are preserved. Background marrow signal is normal. There is degenerative endplate marrow signal abnormality with faint edema at L4-L5, similar to 2020. There is no suspicious marrow signal abnormality or other marrow edema.   Conus medullaris and cauda equina: Conus extends to the L1 level. Conus and cauda equina appear normal.   Paraspinal and other soft tissues: Unremarkable.   Disc levels:   There is advanced disc desiccation and narrowing at L4-L5 and L5-S1 and mild disc degeneration elsewhere in the lumbar spine, overall similar to 2020.   T12-L1: No significant spinal canal or neural foraminal stenosis   L1-L2: Mild facet arthropathy without significant spinal canal or neural foraminal stenosis   L2-L3: There is a mild disc bulge and right worse than left facet arthropathy without significant spinal canal or neural foraminal stenosis, not significantly changed.   L3-L4: There is a diffuse disc bulge and moderate bilateral facet arthropathy with ligamentum flavum thickening resulting in mild spinal canal stenosis with bilateral subarticular zone narrowing but no evidence of frank nerve root impingement, and mild bilateral neural foraminal stenosis unchanged  L4-L5: There is a diffuse  disc bulge, degenerative endplate spurring, and moderate to advanced bilateral facet arthropathy resulting in bilateral subarticular zone narrowing without evidence of frank nerve root impingement, and severe left worse than right neural foraminal stenosis, unchanged   L5-S1: There is a diffuse disc bulge and moderate bilateral facet arthropathy resulting severe right and moderate left neural foraminal stenosis without significant spinal canal stenosis, unchanged.   IMPRESSION: 1. Multilevel degenerative changes in the lumbar spine are overall not significantly changed since 2020. No acute finding. 2. Mild spinal canal and bilateral subarticular zone narrowing and mild bilateral neural foraminal stenosis at L3-L4. 3. Bilateral subarticular zone narrowing and severe left worse than right neural foraminal stenosis at L4-L5. 4. Severe right and moderate left neural foraminal stenosis at L5-S1. 5. Moderate to severe facet arthropathy at L4-L5.     Electronically Signed   By: Valetta Mole M.D.   On: 01/23/2022 15:01  I have personally reviewed the images and agree with the above interpretation.  Assessment and Plan: [name] is a pleasant 73 y.o. male with history of L4-L5 decompression by Dr. Izora Ribas on 02/18/19.    He continues with constant LBP with left buttock and posterior thigh pain to his knee that is worse with standing and walking. No right sided back or leg pain.  He has known lumbar spondylosis and DDD. MRI reviewed with Dr. Izora Ribas and symptoms may be due to compressoin at L4 and L5.   Treatment options discussed with patient and following plan made:   - Order for physical therapy for lumbar spine to [facility] in [place]. Patient to call to schedule appointment.  - If he does not improve with PT, he may be a surgical candidate.  - Will schedule follow up with Dr. Izora Ribas in 6-8 weeks. If he is released from PT he will let us know so we can move that appointment up.    Geronimo Boot PA-C Neurosurgery

## 2022-01-25 ENCOUNTER — Other Ambulatory Visit: Payer: Self-pay | Admitting: Orthopedic Surgery

## 2022-01-25 DIAGNOSIS — M5416 Radiculopathy, lumbar region: Secondary | ICD-10-CM

## 2022-01-25 DIAGNOSIS — M48061 Spinal stenosis, lumbar region without neurogenic claudication: Secondary | ICD-10-CM

## 2022-01-25 DIAGNOSIS — M51369 Other intervertebral disc degeneration, lumbar region without mention of lumbar back pain or lower extremity pain: Secondary | ICD-10-CM

## 2022-01-25 DIAGNOSIS — M5136 Other intervertebral disc degeneration, lumbar region: Secondary | ICD-10-CM

## 2022-01-30 DIAGNOSIS — M48062 Spinal stenosis, lumbar region with neurogenic claudication: Secondary | ICD-10-CM | POA: Diagnosis not present

## 2022-01-30 DIAGNOSIS — M6281 Muscle weakness (generalized): Secondary | ICD-10-CM | POA: Diagnosis not present

## 2022-02-02 ENCOUNTER — Telehealth: Payer: Self-pay

## 2022-02-02 NOTE — Telephone Encounter (Signed)
-----   Message from [place] sent at 02/02/2022  1:46 PM EST ----- Regarding: Reporting a Fall Has an appt scheduled with Dr. Darreld Mclean 02/15 but wanted to let him know that he has had balance issues that have caused 2 falls recently.  Also requested to be added to the wait list if something opened up which I have done.

## 2022-02-12 ENCOUNTER — Encounter (INDEPENDENT_AMBULATORY_CARE_PROVIDER_SITE_OTHER): Payer: PPO | Admitting: Ophthalmology

## 2022-02-12 DIAGNOSIS — E113411 Type 2 diabetes mellitus with severe nonproliferative diabetic retinopathy with macular edema, right eye: Secondary | ICD-10-CM | POA: Diagnosis not present

## 2022-02-12 DIAGNOSIS — H33321 Round hole, right eye: Secondary | ICD-10-CM | POA: Diagnosis not present

## 2022-02-12 DIAGNOSIS — H43811 Vitreous degeneration, right eye: Secondary | ICD-10-CM | POA: Diagnosis not present

## 2022-02-14 NOTE — Progress Notes (Signed)
Referring Physician:  Geronimo Boot, PA-C [address] [place],  [place] [address]  Primary Physician:  Michael Ruths, MD  History of Present Illness: 02/15/2022 Michael Humphrey is here today with a chief complaint of  low back pain that radiates into the bilateral legs down to the feet.  Previously had surgery approximately 3 years ago and did very well.  For the past 6 months, he has had worsening low back and buttock pain.  This is made worse by standing, walking, and laying down.  Nothing is really helped.  He has tried physical therapy and injections without improvement.   Bowel/Bladder Dysfunction: none  Conservative measures:  Physical therapy:  went to 1 visit at [facility] on 01/30/22 and was discharged Multimodal medical therapy including regular antiinflammatories:  gabapentin, tylenol, flexeril   Injections:  has received epidural steroid injections 01/05/2021: Bilateral S1 transforaminal ESI (Celestone 12 mg) 03/28/2021: Bilateral S1 transforaminal ESI (good relief, Celestone 73m) 12/15/2020: Bilateral S1 transforaminal ESI (moderate to good relief, Celestone 169m 10/07/2018: Bilateral S1 transforaminal ESI (good relief, Celestone 12 mg) 06/24/2018: Bilateral S1 transforaminal ESI (good relief, Celestone 12 mg) 01/08/2018: Bilateral S1 transforaminal ESI (good relief, Celestone 12 mg) 10/23/2017: Left S1 transforaminal ESI (moderate to good relief) 12/22/2015: Right S1 transforaminal ESI 08/23/2015: Bilateral S1 transforaminal ESI 06/08/2015: Bilateral S1 transforaminal ESI 11/23/2014: Bilateral S1 transforaminal ESI 06/11/2014: Bilateral S1 transforaminal ESI 05/07/2014: Bilateral S1 transforaminal ESI 12/10/2013: Bilateral S1 transforaminal ESI   Past Surgery:  02/08/19: L4-5 decompression  [name] no symptoms of cervical myelopathy.  The symptoms are causing a significant impact on the patient's life.   Progress Note  from [facility] 01/18/22: Mr. Michael Rubianoas a history of DM, HTN, CKD 3, CVA, OSA, chronic venous stasis, history of PE, MI, CAD, and BPH.    DOS 02/18/19 L4-L5 decompression by Dr. YaIzora Humphrey Last seen by Dr. YaIzora Ribasn 03/26/19 and he was doing great.   18 months of constant LBP with left buttock and posterior thigh pain to his knee. Pain got worse after he did PT for his shoulder. No right sided back or leg pain. Pain is worse with standing and walking. He has trouble getting up from seated position. No numbness or tingling in left leg. He has weakness in left leg. No bowel or bladder issues.    He had 3-4 days of relief with ESI on 01/05/21. Previous injections helped for a few months.  I have utilized the care everywhere function in epic to review the outside records available from external health systems.  Review of Systems:  A 10 point review of systems is negative, except for the pertinent positives and negatives detailed in the HPI.  Past Medical History: Past Medical History:  Diagnosis Date   Arthritis    CAD (coronary artery disease)    a.) LHC 08/22/2012: normal coronaries; b.) LHC 06/08/2021: EF 55-65%. 30% oLM, 100% p-mLAD, 50% o-pLAD, 60% pLAD, 30% pRCA, 75% RPDA, 75% RPAV -- med mgmt.   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   Diabetic retinopathy ([facility]  DISH (diffuse idiopathic skeletal hyperostosis)    Diverticulitis    Full dentures    GERD (gastroesophageal reflux disease)    Heart attack ([place])    High cholesterol    Hordeolum internum right lower eyelid 12/09/2019   Hypertension     Incomplete right bundle branch block (RBBB)    Lumbar radiculitis    a.) L4-L5 decompression   Posterior capsular opacification, 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 Orion Modest recently and sent here   Pulmonary embolism ([place])    Skin cancer    Sleep apnea    a.) unable to tolerate nocturnal PAP therapy   Statin intolerance    T2DM (type 2 diabetes mellitus) ([place])    Tubular adenoma of colon     Past Surgical History: Past Surgical History:  Procedure Laterality Date   AMPUTATION TOE Left 01/15/2020   Procedure: AMPUTATION TOE MPJ T1;  Surgeon: Samara Deist, DPM;  Location: [facility];  Service: Podiatry;  Laterality: Left;   BUNIONECTOMY     CATARACT EXTRACTION W/PHACO Left 12/11/2017   Procedure: CATARACT EXTRACTION PHACO AND INTRAOCULAR LENS PLACEMENT ([place])  LEFT DIABETIC;  Surgeon: Leandrew Koyanagi, MD;  Location: [place];  Service: Ophthalmology;  Laterality: Left;  DIABETIC ([facility] picking pt up at 0600, needs 0630 arrival time)   CATARACT EXTRACTION W/PHACO Right 01/15/2018   Procedure: CATARACT EXTRACTION PHACO AND INTRAOCULAR LENS PLACEMENT ([place])  RIGHT DIABETIC  leave so arrival will be around 7:45 ds;  Surgeon: Leandrew Koyanagi, MD;  [facility]: [facility];  Service: Ophthalmology;  Laterality: Right;  Diabetic - oral meds   CHOLECYSTECTOMY     COLONOSCOPY     COLONOSCOPY N/A 08/22/2021   Procedure: COLONOSCOPY;  Surgeon: Lucilla Lame, MD;  [facility];  Service: Endoscopy;  Laterality: N/A;   COLONOSCOPY WITH PROPOFOL N/A 12/17/2014   Procedure: COLONOSCOPY WITH PROPOFOL;  Surgeon: Lollie Sails, MD;  [facility];  Service: Endoscopy;  Laterality: N/A;   correct hammertoe     FASCIECTOMY Right 10/29/2019   Procedure: PLANTAR FIBROMA RESECTION RIGHT;  Surgeon: Caroline More, DPM;  Location: [place];  Service: Podiatry;  Laterality: Right;  Diabetic - oral meds   JOINT REPLACEMENT     KNEE ARTHROSCOPY Right     LEFT HEART CATH AND CORONARY ANGIOGRAPHY N/A 06/08/2021   Procedure: LEFT HEART CATH AND CORONARY ANGIOGRAPHY;  Surgeon: Isaias Cowman, MD;  Location: [place] [facility];  Service: Cardiovascular;  Laterality: N/A;   LEFT HEART CATH AND CORONARY ANGIOGRAPHY Left 08/22/2012   Procedure: LEFT HEART CATH AND CORONARY ANGIOGRAPHY; Location: [facility]; Surgeon: Bartholome Bill, MD   LUMBAR LAMINECTOMY/ DECOMPRESSION WITH MET-RX N/A 02/18/2019   Procedure: L4-5 DECOMPRESSION;  Surgeon: Meade Maw, MD;  Location: [facility];  Service: Neurosurgery;  Laterality: N/A;   SHOULDER ARTHROSCOPY WITH OPEN ROTATOR CUFF REPAIR AND DISTAL CLAVICLE ACROMINECTOMY Right 11/16/2021   Procedure: SHOULDER ARTHROSCOPY WITH OPEN ROTATOR CUFF REPAIR AND DISTAL CLAVICLE ACROMINECTOMY;  Surgeon: Thornton Park, MD;  Location: [facility];  Service: Orthopedics;  Laterality: Right;   TOTAL HIP ARTHROPLASTY Bilateral    TOTAL KNEE ARTHROPLASTY Bilateral     Allergies: Allergies as of 02/15/2022 - Review Complete 02/15/2022  Allergen Reaction Noted   Clonidine derivatives  12/16/2014   Fioricet [butalbital-apap-caffeine]  12/16/2014   Hydrocodone-acetaminophen  12/16/2014   Mobic [meloxicam]  12/16/2014   Norvasc [amlodipine besylate]  12/16/2014   Statins  12/16/2014   Tramadol Itching 12/04/2016    Medications: Current Meds  Medication Sig   acetaminophen (TYLENOL) 500 MG tablet Take 1,000 mg by mouth every 6 (six) hours as needed.   aspirin EC 81 MG tablet Take 81 mg by mouth daily. Swallow whole.   carvedilol (COREG) 12.5 MG tablet Take 12.5 mg by mouth 2 (two) times daily.    cyclobenzaprine (FLEXERIL) 5 MG tablet Take 5 mg by mouth 3 (three) times daily as needed.   finasteride (PROSCAR) 5 MG tablet Take 5 mg by mouth every morning.    gabapentin (NEURONTIN) 600 MG tablet Take 600 mg by mouth 2 (two) times daily.   glimepiride (AMARYL) 2 MG tablet Take 2 mg by mouth daily with breakfast.    lisinopril (PRINIVIL,ZESTRIL) 2.5 MG tablet Take 1 tablet (2.5 mg total) by mouth daily. (Patient taking differently: Take 2.5 mg by mouth every morning.)   metFORMIN (GLUCOPHAGE) 500 MG tablet Take 500 mg by mouth 2 (two) times daily.   ondansetron (ZOFRAN) 4 MG tablet Take 1 tablet (4 mg total) by mouth every 8 (eight) hours as needed for nausea or vomiting.   torsemide (DEMADEX) 20 MG tablet Take 20 mg by mouth daily as needed (swelling).    Social History: Social History   Tobacco Use   Smoking status: Never   Smokeless tobacco: Former    Types: Chew    Quit date: 02/08/1978   Tobacco comments:    No vap, E cig, chew  Vaping Use   Vaping Use: Never used  Substance Use Topics   Alcohol use: Not Currently   Drug use: No    Family Medical History: Family History  Problem Relation Age of Onset   Clotting disorder Father    Hypertension Father     Physical Examination: Vitals:   02/15/22 1501  BP: (!) 140/80    General: Patient is well developed, well nourished, calm, collected, and in no apparent distress. Attention to examination is appropriate.  Neck:   Supple.  Full range of motion.  Respiratory: Patient is breathing without any difficulty.   NEUROLOGICAL:     Awake, alert, oriented to person, place, and time.  Speech is clear and fluent.   Cranial Nerves: Pupils equal round and reactive to light.  Facial tone is symmetric.  Facial sensation is symmetric. Shoulder shrug is symmetric. Tongue protrusion is midline.  There is no pronator drift.  Strength: Side Biceps Triceps Deltoid Interossei Grip Wrist Ext. Wrist Flex.  R 5 5 5 5 5 5 5  $ L 5 5 5 5 5 5 5   $ Side Iliopsoas Quads Hamstring PF DF EHL  R 5 5 5 5 5 5  $ L 5 5 5 5 5 5   $ Reflexes are 1+ and symmetric at the biceps, triceps, brachioradialis, patella and achilles.   Hoffman's is absent.   Bilateral upper and lower extremity sensation is intact to light touch.    No evidence of dysmetria noted.  Gait  is very slow and requires a cane.     Medical Decision Making  Imaging: MRI L spine 01/23/2022  L3-L4: There is a diffuse disc bulge and moderate bilateral facet arthropathy with ligamentum flavum thickening resulting in mild spinal canal stenosis with bilateral subarticular zone narrowing but no evidence of frank nerve root impingement, and mild bilateral neural foraminal stenosis unchanged   L4-L5: There is a diffuse disc bulge, degenerative endplate spurring, and moderate to advanced bilateral facet arthropathy resulting in bilateral subarticular zone narrowing without evidence of frank nerve root impingement, and severe left  worse than right neural foraminal stenosis, unchanged   L5-S1: There is a diffuse disc bulge and moderate bilateral facet arthropathy resulting severe right and moderate left neural foraminal stenosis without significant spinal canal stenosis, unchanged.   IMPRESSION: 1. Multilevel degenerative changes in the lumbar spine are overall not significantly changed since 2020. No acute finding. 2. Mild spinal canal and bilateral subarticular zone narrowing and mild bilateral neural foraminal stenosis at L3-L4. 3. Bilateral subarticular zone narrowing and severe left worse than right neural foraminal stenosis at L4-L5. 4. Severe right and moderate left neural foraminal stenosis at L5-S1. 5. Moderate to severe facet arthropathy at L4-L5.     Electronically Signed   By: Valetta Mole M.D.   On: 01/23/2022 15:01  I have personally reviewed the images and agree with the above interpretation.  Assessment and Plan: Mr. Llanos is a pleasant 72 y.o. male with worsening of his foraminal disease at L4-5 and L5-S1.  I think he is symptomatic from those 2 levels.  He has tried and failed conservative management including physical therapy and injections.  At this time, 1 could consider surgical intervention.  The reasonable considerations would be L4-S1 transforaminal  lumbar interbody fusion versus spinal cord stimulator evaluation.  I am very concerned about his physical appearance today.  He has had a substantial decline in his performance status over the past 3 years.  I would like to discuss his case with his primary care provider before making any final decisions.  I spent a total of 30 minutes in this patient's care today. This time was spent reviewing pertinent records including imaging studies, obtaining and confirming history, performing a directed evaluation, formulating and discussing my recommendations, and documenting the visit within the medical record.      Thank you for involving me in the care of this patient.      Darene Nappi K. Michael Ribas MD, [facility] Neurosurgery

## 2022-02-15 ENCOUNTER — Encounter: Payer: Self-pay | Admitting: Neurosurgery

## 2022-02-15 ENCOUNTER — Ambulatory Visit (INDEPENDENT_AMBULATORY_CARE_PROVIDER_SITE_OTHER): Payer: PPO | Admitting: Neurosurgery

## 2022-02-15 VITALS — BP 140/80 | Ht 69.0 in | Wt 189.6 lb

## 2022-02-15 DIAGNOSIS — M5416 Radiculopathy, lumbar region: Secondary | ICD-10-CM

## 2022-02-15 DIAGNOSIS — G8929 Other chronic pain: Secondary | ICD-10-CM | POA: Diagnosis not present

## 2022-02-15 DIAGNOSIS — M5442 Lumbago with sciatica, left side: Secondary | ICD-10-CM

## 2022-02-16 ENCOUNTER — Telehealth: Payer: Self-pay

## 2022-02-16 NOTE — Telephone Encounter (Signed)
-----   Message from Peggyann Shoals sent at 02/16/2022 11:48 AM EST ----- Regarding: surgery Contact: [phone] Patient called back. He read over the little book Dr.Yarbrough gave him. He does not want the SCS. He wants the other surgery. If Dr.Yarbrough does not want to do it, patient will find him other doctor that will.

## 2022-02-19 NOTE — Telephone Encounter (Signed)
Patient calling this morning requesting a call back with Dr.Yarbrough. He has questions about the surgeries that were offered to him. He would like an appt to come into the office to talk to him directly.

## 2022-02-19 NOTE — Telephone Encounter (Signed)
Dr Ouida Sills responded: "I don't think the surgery would be unreasonable but he will need a cardiology work up prior to be sure (echo/stress etc). "

## 2022-02-20 NOTE — Telephone Encounter (Signed)
Patient confirmed appt for Friday.

## 2022-02-23 ENCOUNTER — Ambulatory Visit (INDEPENDENT_AMBULATORY_CARE_PROVIDER_SITE_OTHER): Payer: PPO | Admitting: Neurosurgery

## 2022-02-23 ENCOUNTER — Encounter: Payer: Self-pay | Admitting: Neurosurgery

## 2022-02-23 VITALS — BP 148/86 | Ht 69.0 in | Wt 196.2 lb

## 2022-02-23 DIAGNOSIS — G8929 Other chronic pain: Secondary | ICD-10-CM | POA: Diagnosis not present

## 2022-02-23 DIAGNOSIS — M5442 Lumbago with sciatica, left side: Secondary | ICD-10-CM

## 2022-02-23 NOTE — Patient Instructions (Signed)
Please see below for information in regards to your upcoming surgery:  **we are planning to move our office location mid-March. Please check with [place] to see if we have moved to our new location prior to coming to your post op appointments after surgery. Our phone number will remain the same [phone]). Our new address will be: [facility] [address] [place], [place] [address]   Planned surgery: L4-S1 transforaminal lumbar interbody fusion   Surgery date: 03/19/22 - you will find out your arrival time the business day before your surgery.   Pre-op appointment at [facility]: we will call you with a date/time for this. Pre-admit testing is located on the first floor of the [facility], [place], [address]. Please bring all prescriptions in the original prescription bottles to your appointment, even if you have reviewed medications by phone with a pharmacy representative. Pre-op labs may be done at your pre-op appointment. You are not required to fast for these labs. Should you need to change your pre-op appointment, please call Pre-admit testing at [phone].    Metformin - should be held for 2 days prior to surgery   Aspirin: ok to stay on aspirin '81mg'$      Brace: [facility] will contact you regarding an appointment for the brace you will use after surgery. Their number is [phone] should you miss their call or have an issue with your brace after surgery. You will need to bring the brace to the hospital on the day of surgery.    NSAIDS (Non-steroidal anti-inflammatory drugs): because you are having a fusion, no NSAIDS (such as ibuprofen, aleve, naproxen, meloxicam, diclofenac) for 3 months after surgery. Celebrex is an exception. Tylenol is ok because it is not an NSAID.    Home health physical therapy: [facility] [place] will contact you regarding home health physical therapy  for after surgery.Their number is [phone].    Because you are having a fusion: for appointments after your 2 week follow-up: please arrive at the [facility] outpatient imaging center ([place], [place]) or [facility] one hour prior to your appointment for x-rays. This applies to every appointment after your 2 week follow-up. Failure to do so may result in your appointment being rescheduled.     We can be reached by phone or mychart 8am-4pm, Monday-Friday. If you have any questions/concerns before or after surgery, you can reach [place] at [phone], or you can send a mychart message. If you have a concern after hours that cannot wait until normal business hours, you can call [phone] and ask to page the neurosurgeon on call for [place].      Appointments/FMLA & disability paperwork: [place]  Nurse: Ophelia Shoulder  Medical assistants: Lum Keas Physician Assistant's: [place] Surgeon: Meade Maw, MD

## 2022-02-23 NOTE — Progress Notes (Signed)
Referring Physician:  Kirk Ruths, MD [facility] [place],  [place] [address]  Primary Physician:  Kirk Ruths, MD  History of Present Illness: 02/23/2022 Mr. Michael Humphrey presents today to discuss surgical intervention.  I have discussed his case with his primary care provider for guidance.  02/15/2022 Mr. Michael Humphrey is here today with a chief complaint of  low back pain that radiates into the bilateral legs down to the feet.  Previously had surgery approximately 3 years ago and did very well.  For the past 6 months, he has had worsening low back and buttock pain.  This is made worse by standing, walking, and laying down.  Nothing is really helped.  He has tried physical therapy and injections without improvement.   Bowel/Bladder Dysfunction: none  Conservative measures:  Physical therapy:  went to 1 visit at [facility] on 01/30/22 and was discharged Multimodal medical therapy including regular antiinflammatories:  gabapentin, tylenol, flexeril   Injections:  has received epidural steroid injections 01/05/2021: Bilateral S1 transforaminal ESI (Celestone 12 mg) 03/28/2021: Bilateral S1 transforaminal ESI (good relief, Celestone '12mg'$ ) 12/15/2020: Bilateral S1 transforaminal ESI (moderate to good relief, Celestone '12mg'$ ) 10/07/2018: Bilateral S1 transforaminal ESI (good relief, Celestone 12 mg) 06/24/2018: Bilateral S1 transforaminal ESI (good relief, Celestone 12 mg) 01/08/2018: Bilateral S1 transforaminal ESI (good relief, Celestone 12 mg) 10/23/2017: Left S1 transforaminal ESI (moderate to good relief) 12/22/2015: Right S1 transforaminal ESI 08/23/2015: Bilateral S1 transforaminal ESI 06/08/2015: Bilateral S1 transforaminal ESI 11/23/2014: Bilateral S1 transforaminal ESI 06/11/2014: Bilateral S1 transforaminal ESI 05/07/2014: Bilateral S1 transforaminal ESI 12/10/2013: Bilateral S1 transforaminal ESI   Past Surgery:  02/08/19:  L4-5 decompression  Michael Humphrey has no symptoms of cervical myelopathy.  The symptoms are causing a significant impact on the patient's life.   Progress Note from Geronimo Boot, [place] on 01/18/22: Mr. Michael Humphrey has a history of DM, HTN, CKD 3, CVA, OSA, chronic venous stasis, history of PE, MI, CAD, and BPH.    DOS 02/18/19 L4-L5 decompression by Dr. Izora Ribas   Last seen by Dr. Izora Ribas on 03/26/19 and he was doing great.   18 months of constant LBP with left buttock and posterior thigh pain to his knee. Pain got worse after he did PT for his shoulder. No right sided back or leg pain. Pain is worse with standing and walking. He has trouble getting up from seated position. No numbness or tingling in left leg. He has weakness in left leg. No bowel or bladder issues.    He had 3-4 days of relief with ESI on 01/05/21. Previous injections helped for a few months.  I have utilized the care everywhere function in epic to review the outside records available from external health systems.  Review of Systems:  A 10 point review of systems is negative, except for the pertinent positives and negatives detailed in the HPI.  Past Medical History: Past Medical History:  Diagnosis Date   Arthritis    CAD (coronary artery disease)    a.) LHC 08/22/2012: normal coronaries; b.) LHC 06/08/2021: EF 55-65%. 30% oLM, 100% p-mLAD, 50% o-pLAD, 60% pLAD, 30% pRCA, 75% RPDA, 75% RPAV -- med mgmt.   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   Diabetic retinopathy (HCC)    DISH (diffuse idiopathic skeletal hyperostosis)    Diverticulitis     Full dentures    GERD (gastroesophageal reflux disease)    Heart attack ([place])    High cholesterol    Hordeolum internum right lower eyelid 12/09/2019   Hypertension    Incomplete right bundle branch block (RBBB)    Lumbar radiculitis    a.) L4-L5 decompression   Posterior capsular opacification, 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 Orion Modest recently and sent here   Pulmonary embolism ([place])    Skin cancer    Sleep apnea    a.) unable to tolerate nocturnal PAP therapy   Statin intolerance    T2DM (type 2 diabetes mellitus) ([place])    Tubular adenoma of colon     Past Surgical History: Past Surgical History:  Procedure Laterality Date   AMPUTATION TOE Left 01/15/2020   Procedure: AMPUTATION TOE MPJ T1;  Surgeon: Samara Deist, DPM;  Location: [facility];  Service: Podiatry;  Laterality: Left;   BUNIONECTOMY     CATARACT EXTRACTION W/PHACO Left 12/11/2017   Procedure: CATARACT EXTRACTION PHACO AND INTRAOCULAR LENS PLACEMENT ([place])  LEFT DIABETIC;  Surgeon: Leandrew Koyanagi, MD;  Location: [place];  Service: Ophthalmology;  Laterality: Left;  DIABETIC ([facility] picking pt up at 0600, needs 0630 arrival time)   CATARACT EXTRACTION W/PHACO Right 01/15/2018   Procedure: CATARACT EXTRACTION PHACO AND INTRAOCULAR LENS PLACEMENT ([place])  RIGHT DIABETIC  leave so arrival will be around 7:45 ds;  Surgeon: Leandrew Koyanagi, MD;  Location: [place];  Service: Ophthalmology;  Laterality: Right;  Diabetic - oral meds   CHOLECYSTECTOMY     COLONOSCOPY     COLONOSCOPY N/A 08/22/2021   Procedure: COLONOSCOPY;  Surgeon: Lucilla Lame, MD;  Location: [facility] END[facility]COPY;  Service: Endoscopy;  Laterality: N/A;   COLONOSCOPY WITH PROPOFOL N/A 12/17/2014   Procedure: COLONOSCOPY WITH PROPOFOL;  Surgeon: Lollie Sails, MD;  Location: [facility] ENDOSCOPY;  Service: Endoscopy;  Laterality: N/A;   correct hammertoe     FASCIECTOMY Right 10/29/2019   Procedure: PLANTAR FIBROMA RESECTION  RIGHT;  Surgeon: Caroline More, DPM;  Location: [place];  Service: Podiatry;  Laterality: Right;  Diabetic - oral meds   JOINT REPLACEMENT     KNEE ARTHROSCOPY Right    LEFT HEART CATH AND CORONARY ANGIOGRAPHY N/A 06/08/2021   Procedure: LEFT HEART CATH AND CORONARY ANGIOGRAPHY;  Surgeon: Isaias Cowman, MD;  Location: [place] [facility];  Service: Cardiovascular;  Laterality: N/A;   LEFT HEART CATH AND CORONARY ANGIOGRAPHY Left 08/22/2012   Procedure: LEFT HEART CATH AND CORONARY ANGIOGRAPHY; Location: [place]; Surgeon: Bartholome Bill, MD   LUMBAR LAMINECTOMY/ DECOMPRESSION WITH MET-RX N/A 02/18/2019   Procedure: L4-5 DECOMPRESSION;  Surgeon: Meade Maw, MD;  Location: [facility];  Service: Neurosurgery;  Laterality: N/A;   SHOULDER ARTHROSCOPY WITH OPEN ROTATOR CUFF REPAIR AND DISTAL CLAVICLE ACROMINECTOMY Right 11/16/2021   Procedure: SHOULDER ARTHROSCOPY WITH OPEN ROTATOR CUFF REPAIR AND DISTAL CLAVICLE ACROMINECTOMY;  Surgeon: Thornton Park, MD;  Location: [facility];  Service: Orthopedics;  Laterality: Right;   TOTAL HIP ARTHROPLASTY Bilateral    TOTAL KNEE ARTHROPLASTY Bilateral     Allergies: Allergies as of 02/23/2022 - Review Complete 02/23/2022  Allergen Reaction Noted   Clonidine derivatives  12/16/2014   Fioricet [butalbital-apap-caffeine]  12/16/2014   Hydrocodone-acetaminophen  12/16/2014  Mobic [meloxicam]  12/16/2014   Norvasc [amlodipine besylate]  12/16/2014   Statins  12/16/2014   Tramadol Itching 12/04/2016    Medications: Current Meds  Medication Sig   acetaminophen (TYLENOL) 500 MG tablet Take 1,000 mg by mouth every 6 (six) hours as needed.   aspirin EC 81 MG tablet Take 81 mg by mouth daily. Swallow whole.   carvedilol (COREG) 12.5 MG tablet Take 12.5 mg by mouth 2 (two) times daily.    cyclobenzaprine (FLEXERIL) 5 MG tablet Take 5 mg by mouth 3 (three) times daily as needed.   finasteride (PROSCAR) 5 MG tablet Take 5 mg by mouth every  morning.    gabapentin (NEURONTIN) 600 MG tablet Take 600 mg by mouth 2 (two) times daily.   glimepiride (AMARYL) 2 MG tablet Take 2 mg by mouth daily with breakfast.   lisinopril (PRINIVIL,ZESTRIL) 2.5 MG tablet Take 1 tablet (2.5 mg total) by mouth daily. (Patient taking differently: Take 2.5 mg by mouth every morning.)   metFORMIN (GLUCOPHAGE) 500 MG tablet Take 500 mg by mouth 2 (two) times daily.   ondansetron (ZOFRAN) 4 MG tablet Take 1 tablet (4 mg total) by mouth every 8 (eight) hours as needed for nausea or vomiting.   torsemide (DEMADEX) 20 MG tablet Take 20 mg by mouth daily as needed (swelling).    Social History: Social History   Tobacco Use   Smoking status: Never   Smokeless tobacco: Former    Types: Chew    Quit date: 02/08/1978   Tobacco comments:    No vap, E cig, chew  Vaping Use   Vaping Use: Never used  Substance Use Topics   Alcohol use: Not Currently   Drug use: No    Family Medical History: Family History  Problem Relation Age of Onset   Clotting disorder Father    Hypertension Father     Physical Examination: Vitals:   02/23/22 1003  BP: (!) 148/86    General: Patient is well developed, well nourished, calm, collected, and in no apparent distress. Attention to examination is appropriate.  Neck:   Supple.  Full range of motion.  Respiratory: Patient is breathing without any difficulty.   NEUROLOGICAL:     Awake, alert, oriented to person, place, and time.  Speech is clear and fluent.   Cranial Nerves: Pupils equal round and reactive to light.  Facial tone is symmetric.  Facial sensation is symmetric. Shoulder shrug is symmetric. Tongue protrusion is midline.  There is no pronator drift.  Strength: Side Biceps Triceps Deltoid Interossei Grip Wrist Ext. Wrist Flex.  R '5 5 5 5 5 5 5  '$ L '5 5 5 5 5 5 5   '$ Side Iliopsoas Quads Hamstring PF DF EHL  R '5 5 5 5 5 5  '$ L '5 5 5 5 5 5   '$ Reflexes are 1+ and symmetric at the biceps, triceps,  brachioradialis, patella and achilles.   Hoffman's is absent.   Bilateral upper and lower extremity sensation is intact to light touch.    No evidence of dysmetria noted.  Gait is very slow and requires a cane.     Medical Decision Making  Imaging: MRI L spine 01/23/2022  L3-L4: There is a diffuse disc bulge and moderate bilateral facet arthropathy with ligamentum flavum thickening resulting in mild spinal canal stenosis with bilateral subarticular zone narrowing but no evidence of frank nerve root impingement, and mild bilateral neural foraminal stenosis unchanged   L4-L5: There is a diffuse  disc bulge, degenerative endplate spurring, and moderate to advanced bilateral facet arthropathy resulting in bilateral subarticular zone narrowing without evidence of frank nerve root impingement, and severe left worse than right neural foraminal stenosis, unchanged   L5-S1: There is a diffuse disc bulge and moderate bilateral facet arthropathy resulting severe right and moderate left neural foraminal stenosis without significant spinal canal stenosis, unchanged.   IMPRESSION: 1. Multilevel degenerative changes in the lumbar spine are overall not significantly changed since 2020. No acute finding. 2. Mild spinal canal and bilateral subarticular zone narrowing and mild bilateral neural foraminal stenosis at L3-L4. 3. Bilateral subarticular zone narrowing and severe left worse than right neural foraminal stenosis at L4-L5. 4. Severe right and moderate left neural foraminal stenosis at L5-S1. 5. Moderate to severe facet arthropathy at L4-L5.     Electronically Signed   By: Valetta Mole M.D.   On: 01/23/2022 15:01  I have personally reviewed the images and agree with the above interpretation.  Assessment and Plan: Mr. Hildebran is a pleasant 72 y.o. male with worsening of his foraminal disease at L4-5 and L5-S1.  I think he is symptomatic from those 2 levels.  He has tried and failed  conservative management including physical therapy and injections.  At this time, 1 could consider surgical intervention.  The reasonable considerations would be L4-S1 transforaminal lumbar interbody fusion versus spinal cord stimulator evaluation.  I have discussed his case with his primary care provider, who supported surgical intervention.  I have recommended L4-S1 transforaminal lumbar interbody fusion.  I discussed the planned procedure at length with the patient, including the risks, benefits, alternatives, and indications. The risks discussed include but are not limited to bleeding, infection, need for reoperation, spinal fluid leak, stroke, vision loss, anesthetic complication, coma, paralysis, and even death. I also described in detail that improvement was not guaranteed.  The patient expressed understanding of these risks, and asked that we proceed with surgery. I described the surgery in layman's terms, and gave ample opportunity for questions, which were answered to the best of my ability.  I spent a total of 10 minutes in this patient's care today. This time was spent reviewing pertinent records including imaging studies, obtaining and confirming history, performing a directed evaluation, formulating and discussing my recommendations, and documenting the visit within the medical record.      Thank you for involving me in the care of this patient.      Nicholad Kautzman K. Izora Ribas MD, [facility] Neurosurgery

## 2022-02-23 NOTE — H&P (View-Only) (Signed)
  Referring Physician:  Anderson, Marshall W, MD [address] [facility] [place],  [place] [address]  Primary Physician:  Anderson, Marshall W, MD  History of Present Illness: 02/23/2022 Mr. Michael Humphrey presents today to discuss surgical intervention.  I have discussed his case with his primary care provider for guidance.  02/15/2022 Mr. Michael Humphrey is here today with a chief complaint of  low back pain that radiates into the bilateral legs down to the feet.  Previously had surgery approximately 3 years ago and did very well.  For the past 6 months, he has had worsening low back and buttock pain.  This is made worse by standing, walking, and laying down.  Nothing is really helped.  He has tried physical therapy and injections without improvement.   Bowel/Bladder Dysfunction: none  Conservative measures:  Physical therapy:  went to 1 visit at [facility] on 01/30/22 and was discharged Multimodal medical therapy including regular antiinflammatories:  gabapentin, tylenol, flexeril   Injections:  has received epidural steroid injections 01/05/2021: Bilateral S1 transforaminal ESI (Celestone 12 mg) 03/28/2021: Bilateral S1 transforaminal ESI (good relief, Celestone 12mg) 12/15/2020: Bilateral S1 transforaminal ESI (moderate to good relief, Celestone 12mg) 10/07/2018: Bilateral S1 transforaminal ESI (good relief, Celestone 12 mg) 06/24/2018: Bilateral S1 transforaminal ESI (good relief, Celestone 12 mg) 01/08/2018: Bilateral S1 transforaminal ESI (good relief, Celestone 12 mg) 10/23/2017: Left S1 transforaminal ESI (moderate to good relief) 12/22/2015: Right S1 transforaminal ESI 08/23/2015: Bilateral S1 transforaminal ESI 06/08/2015: Bilateral S1 transforaminal ESI 11/23/2014: Bilateral S1 transforaminal ESI 06/11/2014: Bilateral S1 transforaminal ESI 05/07/2014: Bilateral S1 transforaminal ESI 12/10/2013: Bilateral S1 transforaminal ESI   Past Surgery:  02/08/19:  L4-5 decompression  Michael Humphrey has no symptoms of cervical myelopathy.  The symptoms are causing a significant impact on the patient's life.   Progress Note from Stacy Luna, PA on 01/18/22: Mr. Michael Humphrey has a history of DM, HTN, CKD 3, CVA, OSA, chronic venous stasis, history of PE, MI, CAD, and BPH.    DOS 02/18/19 L4-L5 decompression by Dr. Ketan Renz   Last seen by Dr. Vianney Kopecky on 03/26/19 and he was doing great.   18 months of constant LBP with left buttock and posterior thigh pain to his knee. Pain got worse after he did PT for his shoulder. No right sided back or leg pain. Pain is worse with standing and walking. He has trouble getting up from seated position. No numbness or tingling in left leg. He has weakness in left leg. No bowel or bladder issues.    He had 3-4 days of relief with ESI on 01/05/21. Previous injections helped for a few months.  I have utilized the care everywhere function in epic to review the outside records available from external health systems.  Review of Systems:  A 10 point review of systems is negative, except for the pertinent positives and negatives detailed in the HPI.  Past Medical History: Past Medical History:  Diagnosis Date   Arthritis    CAD (coronary artery disease)    a.) LHC 08/22/2012: normal coronaries; b.) LHC 06/08/2021: EF 55-65%. 30% oLM, 100% p-mLAD, 50% o-pLAD, 60% pLAD, 30% pRCA, 75% RPDA, 75% RPAV -- med mgmt.   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   Diabetic retinopathy (HCC)    DISH (diffuse idiopathic skeletal hyperostosis)    Diverticulitis     Full dentures    GERD (gastroesophageal reflux disease)    Heart attack (HCC)    High cholesterol    Hordeolum internum right lower eyelid 12/09/2019   Hypertension    Incomplete right bundle branch block (RBBB)    Lumbar radiculitis    a.) L4-L5 decompression   Posterior capsular opacification, 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 Reid Woodard recently and sent here   Pulmonary embolism (HCC)    Skin cancer    Sleep apnea    a.) unable to tolerate nocturnal PAP therapy   Statin intolerance    T2DM (type 2 diabetes mellitus) (HCC)    Tubular adenoma of colon     Past Surgical History: Past Surgical History:  Procedure Laterality Date   AMPUTATION TOE Left 01/15/2020   Procedure: AMPUTATION TOE MPJ T1;  Surgeon: Fowler, Justin, DPM;  Location: [facility];  Service: Podiatry;  Laterality: Left;   BUNIONECTOMY     CATARACT EXTRACTION W/PHACO Left 12/11/2017   Procedure: CATARACT EXTRACTION PHACO AND INTRAOCULAR LENS PLACEMENT (IOC)  LEFT DIABETIC;  Surgeon: Brasington, Chadwick, MD;  Location: [facility];  Service: Ophthalmology;  Laterality: Left;  DIABETIC ([facility] picking pt up at 0600, needs 0630 arrival time)   CATARACT EXTRACTION W/PHACO Right 01/15/2018   Procedure: CATARACT EXTRACTION PHACO AND INTRAOCULAR LENS PLACEMENT (IOC)  RIGHT DIABETIC  leave so arrival will be around 7:45 ds;  Surgeon: Brasington, Chadwick, MD;  Location: [facility];  Service: Ophthalmology;  Laterality: Right;  Diabetic - oral meds   CHOLECYSTECTOMY     COLONOSCOPY     COLONOSCOPY N/A 08/22/2021   Procedure: COLONOSCOPY;  Surgeon: Wohl, Darren, MD;  Location: [facility];  Service: Endoscopy;  Laterality: N/A;   COLONOSCOPY WITH PROPOFOL N/A 12/17/2014   Procedure: COLONOSCOPY WITH PROPOFOL;  Surgeon: Martin U Skulskie, MD;  Location: [facility];  Service: Endoscopy;  Laterality: N/A;   correct hammertoe     FASCIECTOMY Right 10/29/2019   Procedure: PLANTAR FIBROMA RESECTION  RIGHT;  Surgeon: Baker, Andrew, DPM;  Location: [facility];  Service: Podiatry;  Laterality: Right;  Diabetic - oral meds   JOINT REPLACEMENT     KNEE ARTHROSCOPY Right    LEFT HEART CATH AND CORONARY ANGIOGRAPHY N/A 06/08/2021   Procedure: LEFT HEART CATH AND CORONARY ANGIOGRAPHY;  Surgeon: Paraschos, Alexander, MD;  Location: [facility];  Service: Cardiovascular;  Laterality: N/A;   LEFT HEART CATH AND CORONARY ANGIOGRAPHY Left 08/22/2012   Procedure: LEFT HEART CATH AND CORONARY ANGIOGRAPHY; Location: [facility]; Surgeon: Kenneth Fath, MD   LUMBAR LAMINECTOMY/ DECOMPRESSION WITH MET-RX N/A 02/18/2019   Procedure: L4-5 DECOMPRESSION;  Surgeon: Leilanny Fluitt, MD;  Location: [facility];  Service: Neurosurgery;  Laterality: N/A;   SHOULDER ARTHROSCOPY WITH OPEN ROTATOR CUFF REPAIR AND DISTAL CLAVICLE ACROMINECTOMY Right 11/16/2021   Procedure: SHOULDER ARTHROSCOPY WITH OPEN ROTATOR CUFF REPAIR AND DISTAL CLAVICLE ACROMINECTOMY;  Surgeon: Krasinski, Kevin, MD;  Location: [facility];  Service: Orthopedics;  Laterality: Right;   TOTAL HIP ARTHROPLASTY Bilateral    TOTAL KNEE ARTHROPLASTY Bilateral     Allergies: Allergies as of 02/23/2022 - Review Complete 02/23/2022  Allergen Reaction Noted   Clonidine derivatives  12/16/2014   Fioricet [butalbital-apap-caffeine]  12/16/2014   Hydrocodone-acetaminophen  12/16/2014     Mobic [meloxicam]  12/16/2014   Norvasc [amlodipine besylate]  12/16/2014   Statins  12/16/2014   Tramadol Itching 12/04/2016    Medications: Current Meds  Medication Sig   acetaminophen (TYLENOL) 500 MG tablet Take 1,000 mg by mouth every 6 (six) hours as needed.   aspirin EC 81 MG tablet Take 81 mg by mouth daily. Swallow whole.   carvedilol (COREG) 12.5 MG tablet Take 12.5 mg by mouth 2 (two) times daily.    cyclobenzaprine (FLEXERIL) 5 MG tablet Take 5 mg by mouth 3 (three) times daily as needed.   finasteride (PROSCAR) 5 MG tablet Take 5 mg by mouth every  morning.    gabapentin (NEURONTIN) 600 MG tablet Take 600 mg by mouth 2 (two) times daily.   glimepiride (AMARYL) 2 MG tablet Take 2 mg by mouth daily with breakfast.   lisinopril (PRINIVIL,ZESTRIL) 2.5 MG tablet Take 1 tablet (2.5 mg total) by mouth daily. (Patient taking differently: Take 2.5 mg by mouth every morning.)   metFORMIN (GLUCOPHAGE) 500 MG tablet Take 500 mg by mouth 2 (two) times daily.   ondansetron (ZOFRAN) 4 MG tablet Take 1 tablet (4 mg total) by mouth every 8 (eight) hours as needed for nausea or vomiting.   torsemide (DEMADEX) 20 MG tablet Take 20 mg by mouth daily as needed (swelling).    Social History: Social History   Tobacco Use   Smoking status: Never   Smokeless tobacco: Former    Types: Chew    Quit date: 02/08/1978   Tobacco comments:    No vap, E cig, chew  Vaping Use   Vaping Use: Never used  Substance Use Topics   Alcohol use: Not Currently   Drug use: No    Family Medical History: Family History  Problem Relation Age of Onset   Clotting disorder Father    Hypertension Father     Physical Examination: Vitals:   02/23/22 1003  BP: (!) 148/86    General: Patient is well developed, well nourished, calm, collected, and in no apparent distress. Attention to examination is appropriate.  Neck:   Supple.  Full range of motion.  Respiratory: Patient is breathing without any difficulty.   NEUROLOGICAL:     Awake, alert, oriented to person, place, and time.  Speech is clear and fluent.   Cranial Nerves: Pupils equal round and reactive to light.  Facial tone is symmetric.  Facial sensation is symmetric. Shoulder shrug is symmetric. Tongue protrusion is midline.  There is no pronator drift.  Strength: Side Biceps Triceps Deltoid Interossei Grip Wrist Ext. Wrist Flex.  R 5 5 5 5 5 5 5  L 5 5 5 5 5 5 5   Side Iliopsoas Quads Hamstring PF DF EHL  R 5 5 5 5 5 5  L 5 5 5 5 5 5   Reflexes are 1+ and symmetric at the biceps, triceps,  brachioradialis, patella and achilles.   Hoffman's is absent.   Bilateral upper and lower extremity sensation is intact to light touch.    No evidence of dysmetria noted.  Gait is very slow and requires a cane.     Medical Decision Making  Imaging: MRI L spine 01/23/2022  L3-L4: There is a diffuse disc bulge and moderate bilateral facet arthropathy with ligamentum flavum thickening resulting in mild spinal canal stenosis with bilateral subarticular zone narrowing but no evidence of frank nerve root impingement, and mild bilateral neural foraminal stenosis unchanged   L4-L5: There is a diffuse   disc bulge, degenerative endplate spurring, and moderate to advanced bilateral facet arthropathy resulting in bilateral subarticular zone narrowing without evidence of frank nerve root impingement, and severe left worse than right neural foraminal stenosis, unchanged   L5-S1: There is a diffuse disc bulge and moderate bilateral facet arthropathy resulting severe right and moderate left neural foraminal stenosis without significant spinal canal stenosis, unchanged.   IMPRESSION: 1. Multilevel degenerative changes in the lumbar spine are overall not significantly changed since 2020. No acute finding. 2. Mild spinal canal and bilateral subarticular zone narrowing and mild bilateral neural foraminal stenosis at L3-L4. 3. Bilateral subarticular zone narrowing and severe left worse than right neural foraminal stenosis at L4-L5. 4. Severe right and moderate left neural foraminal stenosis at L5-S1. 5. Moderate to severe facet arthropathy at L4-L5.     Electronically Signed   By: Peter  Noone M.D.   On: 01/23/2022 15:01  I have personally reviewed the images and agree with the above interpretation.  Assessment and Plan: Mr. Netz is a pleasant 71 y.o. male with worsening of his foraminal disease at L4-5 and L5-S1.  I think he is symptomatic from those 2 levels.  He has tried and failed  conservative management including physical therapy and injections.  At this time, 1 could consider surgical intervention.  The reasonable considerations would be L4-S1 transforaminal lumbar interbody fusion versus spinal cord stimulator evaluation.  I have discussed his case with his primary care provider, who supported surgical intervention.  I have recommended L4-S1 transforaminal lumbar interbody fusion.  I discussed the planned procedure at length with the patient, including the risks, benefits, alternatives, and indications. The risks discussed include but are not limited to bleeding, infection, need for reoperation, spinal fluid leak, stroke, vision loss, anesthetic complication, coma, paralysis, and even death. I also described in detail that improvement was not guaranteed.  The patient expressed understanding of these risks, and asked that we proceed with surgery. I described the surgery in layman's terms, and gave ample opportunity for questions, which were answered to the best of my ability.  I spent a total of 10 minutes in this patient's care today. This time was spent reviewing pertinent records including imaging studies, obtaining and confirming history, performing a directed evaluation, formulating and discussing my recommendations, and documenting the visit within the medical record.      Thank you for involving me in the care of this patient.      Briell Paulette K. Margeaux Swantek MD, MPHS Neurosurgery 

## 2022-02-26 ENCOUNTER — Other Ambulatory Visit: Payer: Self-pay

## 2022-02-26 DIAGNOSIS — Z01818 Encounter for other preprocedural examination: Secondary | ICD-10-CM

## 2022-03-01 DIAGNOSIS — E7800 Pure hypercholesterolemia, unspecified: Secondary | ICD-10-CM | POA: Diagnosis not present

## 2022-03-01 DIAGNOSIS — I2510 Atherosclerotic heart disease of native coronary artery without angina pectoris: Secondary | ICD-10-CM | POA: Diagnosis not present

## 2022-03-01 DIAGNOSIS — I129 Hypertensive chronic kidney disease with stage 1 through stage 4 chronic kidney disease, or unspecified chronic kidney disease: Secondary | ICD-10-CM | POA: Diagnosis not present

## 2022-03-01 DIAGNOSIS — E1122 Type 2 diabetes mellitus with diabetic chronic kidney disease: Secondary | ICD-10-CM | POA: Diagnosis not present

## 2022-03-01 DIAGNOSIS — Z86711 Personal history of pulmonary embolism: Secondary | ICD-10-CM | POA: Diagnosis not present

## 2022-03-01 DIAGNOSIS — I10 Essential (primary) hypertension: Secondary | ICD-10-CM | POA: Diagnosis not present

## 2022-03-01 DIAGNOSIS — I779 Disorder of arteries and arterioles, unspecified: Secondary | ICD-10-CM | POA: Diagnosis not present

## 2022-03-01 DIAGNOSIS — Z23 Encounter for immunization: Secondary | ICD-10-CM | POA: Diagnosis not present

## 2022-03-01 DIAGNOSIS — G4733 Obstructive sleep apnea (adult) (pediatric): Secondary | ICD-10-CM | POA: Diagnosis not present

## 2022-03-01 DIAGNOSIS — M791 Myalgia, unspecified site: Secondary | ICD-10-CM | POA: Diagnosis not present

## 2022-03-01 DIAGNOSIS — I2583 Coronary atherosclerosis due to lipid rich plaque: Secondary | ICD-10-CM | POA: Diagnosis not present

## 2022-03-01 DIAGNOSIS — I679 Cerebrovascular disease, unspecified: Secondary | ICD-10-CM | POA: Diagnosis not present

## 2022-03-06 ENCOUNTER — Inpatient Hospital Stay: Admission: RE | Admit: 2022-03-06 | Payer: PPO | Source: Ambulatory Visit

## 2022-03-07 ENCOUNTER — Other Ambulatory Visit: Payer: Self-pay

## 2022-03-07 ENCOUNTER — Encounter
Admission: RE | Admit: 2022-03-07 | Discharge: 2022-03-07 | Disposition: A | Discharge status: Home / Self Care | Payer: PPO | Source: Ambulatory Visit | Attending: Neurosurgery | Admitting: Neurosurgery

## 2022-03-07 VITALS — BP 167/101 | HR 71 | Resp 18 | Ht 69.0 in | Wt 201.1 lb

## 2022-03-07 DIAGNOSIS — D649 Anemia, unspecified: Secondary | ICD-10-CM | POA: Diagnosis not present

## 2022-03-07 DIAGNOSIS — G8921 Chronic pain due to trauma: Secondary | ICD-10-CM | POA: Diagnosis not present

## 2022-03-07 DIAGNOSIS — Z01812 Encounter for preprocedural laboratory examination: Secondary | ICD-10-CM | POA: Insufficient documentation

## 2022-03-07 DIAGNOSIS — Z8719 Personal history of other diseases of the digestive system: Secondary | ICD-10-CM | POA: Insufficient documentation

## 2022-03-07 DIAGNOSIS — M532X6 Spinal instabilities, lumbar region: Secondary | ICD-10-CM | POA: Diagnosis not present

## 2022-03-07 HISTORY — DX: Radiculopathy, lumbar region: M54.16

## 2022-03-07 HISTORY — DX: Other intervertebral disc degeneration, lumbar region: M51.36

## 2022-03-07 HISTORY — DX: Angina pectoris, unspecified: I20.9

## 2022-03-07 HISTORY — DX: Spinal stenosis, lumbar region without neurogenic claudication: M48.061

## 2022-03-07 HISTORY — DX: Other intervertebral disc degeneration, lumbar region without mention of lumbar back pain or lower extremity pain: M51.369

## 2022-03-07 LAB — URINALYSIS, ROUTINE W REFLEX MICROSCOPIC
Bilirubin Urine: NEGATIVE
Glucose, UA: NEGATIVE mg/dL
Hgb urine dipstick: NEGATIVE
Ketones, ur: NEGATIVE mg/dL
Leukocytes,Ua: NEGATIVE
Nitrite: NEGATIVE
Protein, ur: NEGATIVE mg/dL
Specific Gravity, Urine: 1.012 (ref 1.005–1.030)
pH: 5 (ref 5.0–8.0)

## 2022-03-07 LAB — BASIC METABOLIC PANEL
Anion gap: 7 (ref 5–15)
BUN: 16 mg/dL (ref 8–23)
CO2: 25 mmol/L (ref 22–32)
Calcium: 9.1 mg/dL (ref 8.9–10.3)
Chloride: 104 mmol/L (ref 98–111)
Creatinine, Ser: 0.93 mg/dL (ref 0.61–1.24)
GFR, Estimated: >60 mL/min (ref >60)
Glucose, Bld: 108 mg/dL — ABNORMAL HIGH (ref 70–99)
Potassium: 4 mmol/L (ref 3.5–5.1)
Sodium: 136 mmol/L (ref 135–145)

## 2022-03-07 LAB — CBC
HCT: 41.5 % (ref 39.0–52.0)
Hemoglobin: 14 g/dL (ref 13.0–17.0)
MCH: 31.7 pg (ref 26.0–34.0)
MCHC: 33.7 g/dL (ref 30.0–36.0)
MCV: 93.9 fL (ref 80.0–100.0)
Platelets: 155 10*3/uL (ref 150–400)
RBC: 4.42 MIL/uL (ref 4.22–5.81)
RDW: 14.7 % (ref 11.5–15.5)
WBC: 4.7 10*3/uL (ref 4.0–10.5)
nRBC: 0 % (ref 0.0–0.2)

## 2022-03-07 LAB — TYPE AND SCREEN
ABO/RH(D): O POS
Antibody Screen: NEGATIVE

## 2022-03-07 LAB — SURGICAL PCR SCREEN
MRSA, PCR: NEGATIVE
Staphylococcus aureus: POSITIVE — AB

## 2022-03-07 NOTE — Patient Instructions (Addendum)
Your procedure is scheduled on: 03/19/22 - MONDAY Report to the Registration Desk on the 1st floor of the [facility]. To find out your arrival time, please call [phone] between 1PM - 3PM on: 03/14/22 [facility] If your arrival time is 6:00 am, do not arrive before that time as the [facility] entrance doors do not open until 6:00 am.  REMEMBER: Instructions that are not followed completely may result in serious medical risk, up to and including death; or upon the discretion of your surgeon and anesthesiologist your surgery may need to be rescheduled.  Do not eat food after midnight the night before surgery.  No gum chewing or hard candies.  You may however, drink CLEAR liquids up to 2 hours before you are scheduled to arrive for your surgery. Do not drink anything within 2 hours of your scheduled arrival time.  Clear liquids include: - water   One week prior to surgery: Stop Anti-inflammatories (NSAIDS) such as Advil, Aleve, Ibuprofen, Motrin, Naproxen, Naprosyn and Aspirin based products such as Excedrin, Goody's Powder, BC Powder. YOU MAY CONTINUE TAKING YOUR ASPIRIN 81 MG.  NSAIDS (Non-steroidal anti-inflammatory drugs): because you are having a fusion, no NSAIDS (such as ibuprofen, aleve, naproxen, meloxicam, diclofenac) for 3 months after surgery. Celebrex is an exception. Tylenol is ok because it is not an NSAID.   Stop ANY OVER THE COUNTER supplements until after surgery.  You may however, continue to take Tylenol if needed for pain up until the day of surgery.  Continue taking all prescribed medications with the exception of the following:  metFORMIN (GLUCOPHAGE) - Hold BEGINNING 03/17/22.   TAKE ONLY THESE MEDICATIONS THE MORNING OF SURGERY WITH A SIP OF WATER:  carvedilol (COREG)  finasteride (PROSCAR)  gabapentin (NEURONTIN)    No Alcohol for 24 hours before or after surgery.  No Smoking including e-cigarettes for 24 hours before surgery.  No chewable  tobacco products for at least 6 hours before surgery.  No nicotine patches on the day of surgery.  Do not use any "recreational" drugs for at least a week (preferably 2 weeks) before your surgery.  Please be advised that the combination of cocaine and anesthesia may have negative outcomes, up to and including death. If you test positive for cocaine, your surgery will be cancelled.  On the morning of surgery brush your teeth with toothpaste and water, you may rinse your mouth with mouthwash if you wish. Do not swallow any toothpaste or mouthwash.  Use CHG Soap or wipes as directed on instruction sheet.  Do not wear jewelry, make-up, hairpins, clips or nail polish.  Do not wear lotions, powders, or perfumes.   Do not shave body hair from the neck down 48 hours before surgery.  Contact lenses, hearing aids and dentures may not be worn into surgery.  Do not bring valuables to the hospital. [facility] is not responsible for any missing/lost belongings or valuables.   Notify your doctor if there is any change in your medical condition (cold, fever, infection).  Wear comfortable clothing (specific to your surgery type) to the hospital.  After surgery, you can help prevent lung complications by doing breathing exercises.  Take deep breaths and cough every 1-2 hours. Your doctor may order a device called an Incentive Spirometer to help you take deep breaths. When coughing or sneezing, hold a pillow firmly against your incision with both hands. This is called "splinting." Doing this helps protect your incision. It also decreases belly discomfort.  If  you are being admitted to the hospital overnight, leave your suitcase in the car. After surgery it may be brought to your room.  In case of increased patient census, it may be necessary for you, the patient, to continue your postoperative care in the Same Day Surgery department.  If you are being discharged the day of surgery, you will not be  allowed to drive home. You will need a responsible individual to drive you home and stay with you for 24 hours after surgery.   If you are taking public transportation, you will need to have a responsible individual with you.  Please call the [facility]. at [phone] if you have any questions about these instructions.  Surgery Visitation Policy:  Patients undergoing a surgery or procedure may have two family members or support persons with them as long as the person is not COVID-19 positive or experiencing its symptoms.   Inpatient Visitation:    Visiting hours are 7 a.m. to 8 p.m. Up to four visitors are allowed at one time in a patient room. The visitors may rotate out with other people during the day. One designated support person (adult) may remain overnight.  Due to an increase in RSV and influenza rates and associated hospitalizations, children ages 37 and under will not be able to visit patients in [facility]. Masks continue to be strongly recommended.    Preparing for Surgery with CHLORHEXIDINE GLUCONATE (CHG) Soap  Chlorhexidine Gluconate (CHG) Soap  o An antiseptic cleaner that kills germs and bonds with the skin to continue killing germs even after washing  o Used for showering the night before surgery and morning of surgery  Before surgery, you can play an important role by reducing the number of germs on your skin.  CHG (Chlorhexidine gluconate) soap is an antiseptic cleanser which kills germs and bonds with the skin to continue killing germs even after washing.  Please do not use if you have an allergy to CHG or antibacterial soaps. If your skin becomes reddened/irritated stop using the CHG.  1. Shower the NIGHT BEFORE SURGERY and the MORNING OF SURGERY with CHG soap.  2. If you choose to wash your hair, wash your hair first as usual with your normal shampoo.  3. After shampooing, rinse your hair and body thoroughly to remove the  shampoo.  4. Use CHG as you would any other liquid soap. You can apply CHG directly to the skin and wash gently with a scrungie or a clean washcloth.  5. Apply the CHG soap to your body only from the neck down. Do not use on open wounds or open sores. Avoid contact with your eyes, ears, mouth, and genitals (private parts). Wash face and genitals (private parts) with your normal soap.  6. Wash thoroughly, paying special attention to the area where your surgery will be performed.  7. Thoroughly rinse your body with warm water.  8. Do not shower/wash with your normal soap after using and rinsing off the CHG soap.  9. Pat yourself dry with a clean towel.  10. Wear clean pajamas to bed the night before surgery.  12. Place clean sheets on your bed the night of your first shower and do not sleep with pets.  13. Shower again with the CHG soap on the day of surgery prior to arriving at the hospital.  14. Do not apply any deodorants/lotions/powders.  15. Please wear clean clothes to the hospital.

## 2022-03-12 ENCOUNTER — Encounter: Payer: Self-pay | Admitting: Neurosurgery

## 2022-03-12 NOTE — Progress Notes (Signed)
Perioperative / Anesthesia Services  Pre-Admission Testing Clinical Review / Preoperative Anesthesia Consult  Date: 03/12/22  Patient Demographics:  Name: Michael Humphrey DOB:   October 22, 1950 MRN:   [id number]  Planned Surgical Procedure(s):    Case: [id number] Date/Time: 03/19/22 1234   Procedures:      L4-S1 MINIMALLY INVASIVE (MIS) TRANSFORAMINAL LUMBAR INTERBODY FUSION (TLIF)     APPLICATION OF INTRAOPERATIVE CT SCAN   Anesthesia type: General   Pre-op diagnosis: M54.42, G89.29 Chronic bilateral low back pain with left-sided sciatica   Location: [facility] OR ROOM 03 / [place] [facility] FOR ANESTHESIA [facility]   Surgeons: Meade Maw, MD     NOTE: Available PAT nursing documentation and vital signs have been reviewed. Clinical nursing staff has updated patient's PMH/PSHx, current medication list, and drug allergies/intolerances to ensure comprehensive history available to assist in medical decision making as it pertains to the aforementioned surgical procedure and anticipated anesthetic course. Extensive review of available clinical information personally performed. Michael Humphrey PMH and PSHx updated with any diagnoses/procedures that  may have been inadvertently omitted during his intake with the pre-admission testing department's nursing staff.  Clinical Discussion:  Michael Humphrey is a 72 y.o. male who is submitted for pre-surgical anesthesia review and clearance prior to him undergoing the above procedure.  Patient has never been a smoker. Pertinent PMH includes: CAD, MI (?), carotid artery disease, CVA, DVT/PE, IRBBB, HTN, HLD, T2DM, GERD (no daily Tx), OSAH (unable to tolerate nocturnal PAP therapy), lumbar radiculitis (s/p L4-L5 decompression), OA/DISH.   Patient is followed by cardiology Saralyn Pilar, MD). He was last seen in the cardiology clinic on 03/01/2022; notes reviewed. At the time of his clinic visit, patient doing well overall from a cardiovascular perspective. Patient  denied any chest pain, shortness of breath, PND, orthopnea, palpitations, significant peripheral edema, weakness, fatigue, vertiginous symptoms, or presyncope/syncope. Patient with a past medical history significant for cardiovascular diagnoses. Documented physical exam was grossly benign, providing no evidence of acute exacerbation and/or decompensation of the patient's known cardiovascular conditions.  Patient has a questionable history of MI in the past. I am unable to locate any records regarding an actual MI. Cardiology notes have been thoroughly reviewed, however there is no mention of MI in the past.   Diagnostic LEFT heart catheterization performed on 08/22/2012 revealed no evidence of obstructive CAD.  Patient suffered an acute CVA on 07/15/2017. MRI of the brain revealed 11 mm acute/early subacute infarction within left lateral frontal subcortical white matter and corona radiata. There was also evidence of a Small chronic infarct within the right mid corona radiata and right caudate head.  TTE performed on 02/27/2018 revealed a low normal left ventricular systolic function with an EF of 50-55%.  Diastolic Doppler parameters were normal.  Right ventricular size and function normal.  There was trivial pericardial effusion present.  There was no evidence of significant valvular regurgitation.  There were no significant transvalvular gradients to suggest stenosis.   Myocardial perfusion imaging study performed on 05/17/2021 revealed a low normal left ventricular systolic function with an EF of 51%  There was normal regional wall motion.  There was evidence of a small reversible perfusion abnormality of mild intensity present in the inferoapical and septal region on stress images consistent with ischemia.   Repeat diagnostic LEFT heart catheterization performed on 06/08/2021 revealing a normal left ventricular systolic function with an EF of 55-65%.  There was multivessel CAD; 30% ostial LM, 100%  proximal to mid LAD, 50% ostial to proximal LAD,  60% proximal LAD, 30% proximal RCA, 75% RPDA, and 75% RPAV.  Cardiology noted that mid LAD, mid to distal PDA, and PL 2 were all chronically occluded.  The decision was made to defer further intervention and opted for aggressive medical management.  Blood pressure mildly elevated at 142/72 mmHg on currently prescribed beta-blocker (carvedilol), ACEi (lisinopril), and diuretic (torsemide) therapies.  Patient is taking rosuvastatin or his HLD diagnosis and ASCVD prevention.  T2DM well-controlled on currently prescribed regimen; last HgbA1c was 5.8% when checked on 12/22/2021. Patient does have an OSAH diagnosis, however he is unable to tolerate the prescribed nocturnal PAP therapy.  Patient has not been very active as of late due to pain in his back. With that being said, patient still felt to be able  to achieve >4 METS of physical activity without experiencing any degree of angina/anginal equivalent symptoms.  No changes were made to his medication regimen.  Patient to follow-up with outpatient cardiology in 4 months or sooner if needed.   Michael Humphrey is scheduled for an L4-S1 MINIMALLY INVASIVE (MIS) TRANSFORAMINAL LUMBAR INTERBODY FUSION (TLIF) on 03/19/2022 with Dr. Meade Maw, MD.  Given patient's past medical history significant for cardiovascular diagnoses, presurgical cardiac clearance was sought by the PAT team. Per cardiology, "this patient is optimized for surgery and may proceed with the planned procedural course with a LOW risk of significant perioperative cardiovascular complications".    In review of his medication reconciliation, it is noted that patient is currently on prescribed daily antiplatelet therapy. Given his cardiovascular history, surgeon has cleared patient to remain on his daily low dose ASA throughout his perioperaitve course.   Patient denies previous perioperative complications with anesthesia in the past. In  review of the available records, it is noted that patient underwent a general anesthetic course here at [facility] ([facility]) in 11/2021 without documented complications.      03/07/2022    9:43 AM 03/07/2022    9:06 AM 02/23/2022   10:03 AM  Vitals with BMI  Height  '5\' 9"'$  '5\' 9"'$   Weight  201 lbs 1 oz 196 lbs 3 oz  BMI  [id number]  Systolic [id number]  [id number]  Diastolic 99991111  86  Pulse  71     Providers/Specialists:   NOTE: Primary physician provider listed below. Patient may have been seen by APP or partner within same practice.   PROVIDER ROLE / SPECIALTY LAST Dola Factor, MD Neurosurgery (Surgeon) 02/23/2022  Kirk Ruths, MD Primary Care Provider 12/29/2021  Isaias Cowman, MD Cardiology 03/01/2022   Allergies:  Clonidine derivatives, Fioricet [butalbital-apap-caffeine], Hydrocodone-acetaminophen, Mobic [meloxicam], Norvasc [amlodipine besylate], Statins, and Tramadol  Current Home Medications:   No current facility-administered medications for this encounter.    acetaminophen (TYLENOL) 500 MG tablet   aspirin EC 81 MG tablet   carvedilol (COREG) 12.5 MG tablet   cyclobenzaprine (FLEXERIL) 5 MG tablet   finasteride (PROSCAR) 5 MG tablet   gabapentin (NEURONTIN) 600 MG tablet   glimepiride (AMARYL) 2 MG tablet   lisinopril (PRINIVIL,ZESTRIL) 2.5 MG tablet   metFORMIN (GLUCOPHAGE) 500 MG tablet   methocarbamol (ROBAXIN) 750 MG tablet   rosuvastatin (CRESTOR) 5 MG tablet   torsemide (DEMADEX) 20 MG tablet   History:   Past Medical History:  Diagnosis Date   Anginal pain (HCC)    Arthritis    CAD (coronary artery disease)    a.) LHC 08/22/2012: normal coronaries; b.) LHC 06/08/2021: EF 55-65%. 30% oLM,  100% p-mLAD, 50% o-pLAD, 60% pLAD, 30% pRCA, 75% RPDA, 75% RPAV -- med mgmt.   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   DDD (degenerative disc disease), lumbar    Diabetic retinopathy (HCC)    DISH (diffuse idiopathic skeletal hyperostosis)    Diverticulitis    Full dentures    GERD (gastroesophageal reflux disease)    Heart attack (HCC)    High cholesterol    Hordeolum internum right lower eyelid 12/09/2019   Hypertension    Incomplete right bundle branch block (RBBB)    Lumbar radiculitis    a.) L4-L5 decompression   Lumbar radiculopathy    Posterior capsular opacification, 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 Orion Modest recently and sent here   Pulmonary embolism ([place])    Skin cancer    Sleep apnea    a.) unable to tolerate nocturnal PAP therapy   Spinal stenosis of lumbar region, unspecified whether neurogenic claudication present    Statin intolerance    T2DM (type 2 diabetes mellitus) ([place])    Tubular adenoma of colon    Past Surgical History:  Procedure Laterality Date   AMPUTATION TOE Left 01/15/2020   Procedure: AMPUTATION TOE MPJ T1;  Surgeon: Samara Deist, DPM;  Location: [facility] ORS;  Service: Podiatry;  Laterality: Left;   BUNIONECTOMY     CATARACT EXTRACTION W/PHACO Left 12/11/2017   Procedure: CATARACT EXTRACTION PHACO AND INTRAOCULAR LENS PLACEMENT ([place])  LEFT DIABETIC;  Surgeon: Leandrew Koyanagi, MD;  Location: [place];  Service: Ophthalmology;  Laterality: Left;  DIABETIC ([facility] picking pt up at 0600, needs 0630 arrival time)   CATARACT EXTRACTION W/PHACO Right 01/15/2018   Procedure: CATARACT EXTRACTION PHACO AND INTRAOCULAR LENS PLACEMENT ([place])  RIGHT DIABETIC  leave so arrival will be around 7:45 ds;  Surgeon: Leandrew Koyanagi, MD;  Location: [place];  Service: Ophthalmology;   Laterality: Right;  Diabetic - oral meds   CHOLECYSTECTOMY     COLONOSCOPY     COLONOSCOPY N/A 08/22/2021   Procedure: COLONOSCOPY;  Surgeon: Lucilla Lame, MD;  Location: [facility] ENDOSCOPY;  Service: Endoscopy;  Laterality: N/A;   COLONOSCOPY WITH PROPOFOL N/A 12/17/2014   Procedure: COLONOSCOPY WITH PROPOFOL;  Surgeon: Lollie Sails, MD;  Location: [facility] ENDOSCOPY;  Service: Endoscopy;  Laterality: N/A;   correct hammertoe     FASCIECTOMY Right 10/29/2019   Procedure: PLANTAR FIBROMA RESECTION RIGHT;  Surgeon: Caroline More, DPM;  Location: [place];  Service: Podiatry;  Laterality: Right;  Diabetic - oral meds   JOINT REPLACEMENT     KNEE ARTHROSCOPY Right    LEFT HEART CATH AND CORONARY ANGIOGRAPHY N/A 06/08/2021   Procedure: LEFT HEART CATH AND CORONARY ANGIOGRAPHY;  Surgeon: Isaias Cowman, MD;  Location: [place] CV [facility];  Service: Cardiovascular;  Laterality: N/A;   LEFT HEART CATH AND CORONARY ANGIOGRAPHY Left 08/22/2012   Procedure: LEFT HEART CATH AND CORONARY ANGIOGRAPHY; Location: [place]; Surgeon: Bartholome Bill, MD   LUMBAR LAMINECTOMY/ DECOMPRESSION WITH MET-RX N/A 02/18/2019   Procedure: L4-5 DECOMPRESSION;  Surgeon: Meade Maw, MD;  Location: [facility] ORS;  Service: Neurosurgery;  Laterality: N/A;   SHOULDER ARTHROSCOPY WITH OPEN  ROTATOR CUFF REPAIR AND DISTAL CLAVICLE ACROMINECTOMY Right 11/16/2021   Procedure: SHOULDER ARTHROSCOPY WITH OPEN ROTATOR CUFF REPAIR AND DISTAL CLAVICLE ACROMINECTOMY;  Surgeon: Thornton Park, MD;  Location: [facility] ORS;  Service: Orthopedics;  Laterality: Right;   TOTAL HIP ARTHROPLASTY Bilateral    TOTAL KNEE ARTHROPLASTY Bilateral    Family History  Problem Relation Age of Onset   Clotting disorder Father    Hypertension Father    Social History   Tobacco Use   Smoking status: Never   Smokeless tobacco: Former    Types: Chew    Quit date: 02/08/1978   Tobacco comments:    No vap, E cig, chew  Vaping Use   Vaping  Use: Never used  Substance Use Topics   Alcohol use: Not Currently   Drug use: No    Pertinent Clinical Results:  LABS:   No visits with results within 3 Day(s) from this visit.  Latest known visit with results is:  Hospital Outpatient Visit on 03/07/2022  Component Date Value Ref Range Status   WBC 03/07/2022 4.7  4.0 - 10.5 K/uL Final   RBC 03/07/2022 4.42  4.22 - 5.81 MIL/uL Final   Hemoglobin 03/07/2022 14.0  13.0 - 17.0 g/dL Final   HCT 03/07/2022 41.5  39.0 - 52.0 % Final   MCV 03/07/2022 93.9  80.0 - 100.0 fL Final   MCH 03/07/2022 31.7  26.0 - 34.0 pg Final   MCHC 03/07/2022 33.7  30.0 - 36.0 g/dL Final   RDW 03/07/2022 14.7  11.5 - 15.5 % Final   Platelets 03/07/2022 155  150 - 400 K/uL Final   nRBC 03/07/2022 0.0  0.0 - 0.2 % Final   Performed at [facility], [place], [place] [address]   Sodium 03/07/2022 136  135 - 145 mmol/L Final   Potassium 03/07/2022 4.0  3.5 - 5.1 mmol/L Final   Chloride 03/07/2022 104  98 - 111 mmol/L Final   CO2 03/07/2022 25  22 - 32 mmol/L Final   Glucose, Bld 03/07/2022 108 (H)  70 - 99 mg/dL Final   Glucose reference range applies only to samples taken after fasting for at least 8 hours.   BUN 03/07/2022 16  8 - 23 mg/dL Final   Creatinine, Ser 03/07/2022 0.93  0.61 - 1.24 mg/dL Final   Calcium 03/07/2022 9.1  8.9 - 10.3 mg/dL Final   GFR, Estimated 03/07/2022 >60  >60 mL/min Final   Comment: (NOTE) Calculated using the CKD-EPI Creatinine Equation (2021)    Anion gap 03/07/2022 7  5 - 15 Final   Performed at [facility], [address] [place], [place] [address]   MRSA, PCR 03/07/2022 NEGATIVE  NEGATIVE Final   Staphylococcus aureus 03/07/2022 POSITIVE (A)  NEGATIVE Final   Comment: (NOTE) The Xpert SA Assay (FDA approved for NASAL specimens in patients 80 years of age and older), is one component of a comprehensive surveillance program. It is not intended to diagnose infection nor to guide or  monitor treatment. Performed at [facility], [address] [place], [place] [address]    ABO/RH(D) 03/07/2022 O POS   Final   Antibody Screen 03/07/2022 NEG   Final   Sample Expiration 03/07/2022 03/21/2022,2359   Final   Extend sample reason 03/07/2022    Final                   Value:NO TRANSFUSIONS OR PREGNANCY IN THE PAST 3 MONTHS Performed at [facility], [place], [place] [address]    Color, Urine 03/07/2022 STRAW (A)  YELLOW Final   APPearance 03/07/2022 CLEAR (A)  CLEAR Final   Specific Gravity, Urine 03/07/2022 1.012  1.005 - 1.030 Final   pH 03/07/2022 5.0  5.0 - 8.0 Final   Glucose, UA 03/07/2022 NEGATIVE  NEGATIVE mg/dL Final   Hgb urine dipstick 03/07/2022 NEGATIVE  NEGATIVE Final   Bilirubin Urine 03/07/2022 NEGATIVE  NEGATIVE Final   Ketones, ur 03/07/2022 NEGATIVE  NEGATIVE mg/dL Final   Protein, ur 03/07/2022 NEGATIVE  NEGATIVE mg/dL Final   Nitrite 03/07/2022 NEGATIVE  NEGATIVE Final   Leukocytes,Ua 03/07/2022 NEGATIVE  NEGATIVE Final   Performed at [facility], [address] [place], [place] [address]    ECG: Date: 03/01/2022 Time ECG obtained: 1453 PM Rate: 68 bpm Rhythm: normal sinus; IRBBB Axis (leads I and aVF): Left axis deviation Intervals: PR 176 ms. QRS 98 ms. QTc 412 ms. ST segment and T wave changes: No evidence of acute ST segment elevation or depression Comparison: Similar to previous tracing obtained on 06/08/2021   IMAGING / PROCEDURES: MR LUMBAR SPINE WO CONTRAST performed on 01/23/2022 Multilevel degenerative changes in the lumbar spine are overall not significantly changed since 2020. No acute finding. Mild spinal canal and bilateral subarticular zone narrowing and mild bilateral neural foraminal stenosis at L3-L4 Bilateral subarticular zone narrowing and severe left worse than right neural foraminal stenosis at L4-L5. Severe right and moderate left neural foraminal stenosis at  L5-S1. Moderate to severe facet arthropathy at L4-L5.  LEFT HEART CATHETERIZATION AND CORONARY ANGIOGRAPHY performed on 06/08/2021 Normal left ventricular systolic function with an EF of 55-65% Normal LVEDP  Multivessel CAD 30% ostial LM  100% proximal to mid LAD  50% ostial to proximal LAD 60% proximal LAD 30% proximal RCA 75% RPDA 75% RPAV  Recommendations: continue medical management      MYOCARDIAL PERFUSION IMAGING STUDY (LEXISCAN) performed on 05/17/2021 Low normal left ventricular systolic function with an EF of 51% No regional wall motion abnormalities SPECT images demonstrate a small reversible perfusion abnormality of mild intensity present in the inferoapical and septal region on stress images consistent with ischemia.   TRANSTHORACIC ECHOCARDIOGRAM performed on 02/27/2018 The left ventricle has low normal systolic function, with an ejection fraction of 50-55%. The cavity size was normal. Left ventricular diastolic parameters were normal.  The right ventricle has normal systolic function. The cavity was normal. There is no increase in right ventricular wall thickness.  Trivial pericardial effusion is present.  The mitral valve is normal in structure.  The tricuspid valve is normal in structure.  The aortic valve is tricuspid.  The pulmonic valve was normal in structure.   Impression and Plan:  Erickson Kocian has been referred for pre-anesthesia review and clearance prior to him undergoing the planned anesthetic and procedural courses. Available labs, pertinent testing, and imaging results were personally reviewed by me in preparation for upcoming operative/procedural course. [facility] medical record has been updated following extensive record review and patient interview with PAT staff.   This patient has been appropriately cleared by cardiology with an overall LOW risk of significant perioperative cardiovascular complications. Based on clinical review  performed today (03/12/22), barring any significant acute changes in the patient's overall condition, it is anticipated that he will be able to proceed with the planned surgical intervention. Any acute changes in clinical condition may necessitate his procedure being postponed and/or cancelled. Patient will meet with anesthesia team (  MD and/or CRNA) on the day of his procedure for preoperative evaluation/assessment. Questions regarding anesthetic course will be fielded at that time.   Pre-surgical instructions were reviewed with the patient during his PAT appointment, and questions were fielded to satisfaction by PAT clinical staff. He has been instructed on which medications that he will need to hold prior to surgery, as well as the ones that have been deemed safe/appropriate to take of the day of his procedure. As part of the general education provided by PAT, patient made aware both verbally and in writing, that he would need to abstain from the use of any illegal substances during his perioperative course.  He was advised that failure to follow the provided instructions could necessitate case cancellation or result serious perioperative complications up to and including death. Patient encouraged to contact PAT and/or his surgeon's office to discuss any questions or concerns that may arise prior to surgery; verbalized understanding.   Honor Loh, MSN, APRN, FNP-C, CEN [facility]  Peri-operative Services Nurse Practitioner Phone: [phone] Fax: [phone] 03/12/22 2:24 PM  NOTE: This note has been prepared using Dragon dictation software. Despite my best ability to proofread, there is always the potential that unintentional transcriptional errors may still occur from this process.

## 2022-03-14 DIAGNOSIS — E119 Type 2 diabetes mellitus without complications: Secondary | ICD-10-CM | POA: Diagnosis not present

## 2022-03-18 MED ORDER — CEFAZOLIN SODIUM-DEXTROSE 2-4 GM/100ML-% IV SOLN
2.0000 g | INTRAVENOUS | Status: AC
  Administered 2022-03-19: 2 g via INTRAVENOUS

## 2022-03-18 MED ORDER — ORAL CARE MOUTH RINSE: 15.0000 mL | Freq: Once | OROMUCOSAL | Status: AC

## 2022-03-18 MED ORDER — VANCOMYCIN HCL IN DEXTROSE 1-5 GM/200ML-% IV SOLN: 1000.0000 mg | Freq: Once | INTRAVENOUS | Status: AC

## 2022-03-18 MED ORDER — CEFAZOLIN IN SODIUM CHLORIDE 2-0.9 GM/100ML-% IV SOLN
2.0000 g | Freq: Once | INTRAVENOUS | Status: DC
  Filled 2022-03-18: qty 100

## 2022-03-18 MED ORDER — CHLORHEXIDINE GLUCONATE 0.12 % MT SOLN: 15.0000 mL | Freq: Once | OROMUCOSAL | Status: AC

## 2022-03-18 MED ORDER — SODIUM CHLORIDE 0.9 % IV SOLN: INTRAVENOUS | Status: DC

## 2022-03-18 MED ORDER — FAMOTIDINE 20 MG PO TABS: 20.0000 mg | ORAL_TABLET | Freq: Once | ORAL | Status: AC

## 2022-03-19 ENCOUNTER — Other Ambulatory Visit: Payer: Self-pay

## 2022-03-19 ENCOUNTER — Inpatient Hospital Stay: Payer: PPO

## 2022-03-19 ENCOUNTER — Encounter: Payer: Self-pay | Admitting: Neurosurgery

## 2022-03-19 ENCOUNTER — Encounter
Admission: RE | Disposition: A | Discharge status: Home Health | Payer: Self-pay | Source: Home / Self Care | Attending: Neurosurgery

## 2022-03-19 ENCOUNTER — Inpatient Hospital Stay
Admission: RE | Admit: 2022-03-19 | Discharge: 2022-03-22 | DRG: 455 | Disposition: A | Discharge status: Home Health | Payer: PPO | Attending: Neurosurgery | Admitting: Neurosurgery

## 2022-03-19 ENCOUNTER — Inpatient Hospital Stay: Payer: PPO | Admitting: Urgent Care

## 2022-03-19 DIAGNOSIS — Z832 Family history of diseases of the blood and blood-forming organs and certain disorders involving the immune mechanism: Secondary | ICD-10-CM

## 2022-03-19 DIAGNOSIS — Z86711 Personal history of pulmonary embolism: Secondary | ICD-10-CM

## 2022-03-19 DIAGNOSIS — I2510 Atherosclerotic heart disease of native coronary artery without angina pectoris: Secondary | ICD-10-CM | POA: Diagnosis present

## 2022-03-19 DIAGNOSIS — Z8249 Family history of ischemic heart disease and other diseases of the circulatory system: Secondary | ICD-10-CM

## 2022-03-19 DIAGNOSIS — I10 Essential (primary) hypertension: Secondary | ICD-10-CM | POA: Diagnosis not present

## 2022-03-19 DIAGNOSIS — G4733 Obstructive sleep apnea (adult) (pediatric): Secondary | ICD-10-CM | POA: Diagnosis not present

## 2022-03-19 DIAGNOSIS — Z886 Allergy status to analgesic agent status: Secondary | ICD-10-CM | POA: Diagnosis not present

## 2022-03-19 DIAGNOSIS — K219 Gastro-esophageal reflux disease without esophagitis: Secondary | ICD-10-CM | POA: Diagnosis not present

## 2022-03-19 DIAGNOSIS — E11319 Type 2 diabetes mellitus with unspecified diabetic retinopathy without macular edema: Secondary | ICD-10-CM | POA: Diagnosis not present

## 2022-03-19 DIAGNOSIS — Z7982 Long term (current) use of aspirin: Secondary | ICD-10-CM

## 2022-03-19 DIAGNOSIS — Z89422 Acquired absence of other left toe(s): Secondary | ICD-10-CM

## 2022-03-19 DIAGNOSIS — Z86718 Personal history of other venous thrombosis and embolism: Secondary | ICD-10-CM

## 2022-03-19 DIAGNOSIS — I4510 Unspecified right bundle-branch block: Secondary | ICD-10-CM | POA: Diagnosis present

## 2022-03-19 DIAGNOSIS — E7800 Pure hypercholesterolemia, unspecified: Secondary | ICD-10-CM | POA: Diagnosis not present

## 2022-03-19 DIAGNOSIS — Z96653 Presence of artificial knee joint, bilateral: Secondary | ICD-10-CM | POA: Diagnosis not present

## 2022-03-19 DIAGNOSIS — Z888 Allergy status to other drugs, medicaments and biological substances status: Secondary | ICD-10-CM | POA: Diagnosis not present

## 2022-03-19 DIAGNOSIS — Z01818 Encounter for other preprocedural examination: Secondary | ICD-10-CM

## 2022-03-19 DIAGNOSIS — M5442 Lumbago with sciatica, left side: Principal | ICD-10-CM | POA: Diagnosis present

## 2022-03-19 DIAGNOSIS — Z885 Allergy status to narcotic agent status: Secondary | ICD-10-CM

## 2022-03-19 DIAGNOSIS — E1122 Type 2 diabetes mellitus with diabetic chronic kidney disease: Secondary | ICD-10-CM | POA: Diagnosis not present

## 2022-03-19 DIAGNOSIS — G8929 Other chronic pain: Secondary | ICD-10-CM | POA: Diagnosis not present

## 2022-03-19 DIAGNOSIS — Z981 Arthrodesis status: Secondary | ICD-10-CM | POA: Diagnosis not present

## 2022-03-19 DIAGNOSIS — N183 Chronic kidney disease, stage 3 unspecified: Secondary | ICD-10-CM | POA: Diagnosis present

## 2022-03-19 DIAGNOSIS — I878 Other specified disorders of veins: Secondary | ICD-10-CM | POA: Diagnosis not present

## 2022-03-19 DIAGNOSIS — Z8673 Personal history of transient ischemic attack (TIA), and cerebral infarction without residual deficits: Secondary | ICD-10-CM | POA: Diagnosis not present

## 2022-03-19 DIAGNOSIS — Z96643 Presence of artificial hip joint, bilateral: Secondary | ICD-10-CM | POA: Diagnosis present

## 2022-03-19 DIAGNOSIS — Z87891 Personal history of nicotine dependence: Secondary | ICD-10-CM | POA: Diagnosis not present

## 2022-03-19 DIAGNOSIS — I129 Hypertensive chronic kidney disease with stage 1 through stage 4 chronic kidney disease, or unspecified chronic kidney disease: Secondary | ICD-10-CM | POA: Diagnosis not present

## 2022-03-19 DIAGNOSIS — Z85828 Personal history of other malignant neoplasm of skin: Secondary | ICD-10-CM

## 2022-03-19 DIAGNOSIS — Z01812 Encounter for preprocedural laboratory examination: Secondary | ICD-10-CM

## 2022-03-19 DIAGNOSIS — N400 Enlarged prostate without lower urinary tract symptoms: Secondary | ICD-10-CM | POA: Diagnosis present

## 2022-03-19 DIAGNOSIS — G4730 Sleep apnea, unspecified: Secondary | ICD-10-CM | POA: Diagnosis not present

## 2022-03-19 DIAGNOSIS — I252 Old myocardial infarction: Secondary | ICD-10-CM | POA: Diagnosis not present

## 2022-03-19 DIAGNOSIS — Z7984 Long term (current) use of oral hypoglycemic drugs: Secondary | ICD-10-CM

## 2022-03-19 DIAGNOSIS — Z79899 Other long term (current) drug therapy: Secondary | ICD-10-CM

## 2022-03-19 HISTORY — PX: APPLICATION OF INTRAOPERATIVE CT SCAN: SHX6668

## 2022-03-19 HISTORY — PX: TRANSFORAMINAL LUMBAR INTERBODY FUSION W/ MIS 2 LEVEL: SHX6146

## 2022-03-19 LAB — GLUCOSE, CAPILLARY
Glucose-Capillary: 106 mg/dL — ABNORMAL HIGH (ref 70–99)
Glucose-Capillary: 122 mg/dL — ABNORMAL HIGH (ref 70–99)

## 2022-03-19 SURGERY — MINIMALLY INVASIVE (MIS) TRANSFORAMINAL LUMBAR INTERBODY FUSION (TLIF) 2 LEVEL
Anesthesia: General | Site: Back

## 2022-03-19 MED ORDER — SODIUM CHLORIDE 0.9% FLUSH
3.0000 mL | Freq: Two times a day (BID) | INTRAVENOUS | Status: DC
  Administered 2022-03-19 – 2022-03-22 (×6): 3 mL via INTRAVENOUS

## 2022-03-19 MED ORDER — MORPHINE SULFATE (PF) 2 MG/ML IV SOLN: 1.0000 mg | INTRAVENOUS | Status: DC | PRN

## 2022-03-19 MED ORDER — DOCUSATE SODIUM 100 MG PO CAPS
100.0000 mg | ORAL_CAPSULE | Freq: Two times a day (BID) | ORAL | Status: DC
  Administered 2022-03-19 – 2022-03-22 (×5): 100 mg via ORAL
  Filled 2022-03-19 (×6): qty 1

## 2022-03-19 MED ORDER — OXYCODONE HCL 5 MG PO TABS
ORAL_TABLET | ORAL | Status: AC
  Filled 2022-03-19: qty 1

## 2022-03-19 MED ORDER — FENTANYL CITRATE (PF) 100 MCG/2ML IJ SOLN
50.0000 ug | Freq: Once | INTRAMUSCULAR | Status: AC
  Administered 2022-03-19: 50 ug via INTRAVENOUS

## 2022-03-19 MED ORDER — FENTANYL CITRATE (PF) 100 MCG/2ML IJ SOLN
INTRAMUSCULAR | Status: AC
  Filled 2022-03-19: qty 2

## 2022-03-19 MED ORDER — SODIUM CHLORIDE 0.9 % IV SOLN: 250.0000 mL | INTRAVENOUS | Status: DC

## 2022-03-19 MED ORDER — ONDANSETRON HCL 4 MG PO TABS: 4.0000 mg | ORAL_TABLET | Freq: Four times a day (QID) | ORAL | Status: DC | PRN

## 2022-03-19 MED ORDER — ONDANSETRON HCL 4 MG/2ML IJ SOLN: 4.0000 mg | Freq: Four times a day (QID) | INTRAMUSCULAR | Status: DC | PRN

## 2022-03-19 MED ORDER — OXYCODONE HCL 5 MG PO TABS
5.0000 mg | ORAL_TABLET | Freq: Once | ORAL | Status: AC | PRN
  Administered 2022-03-19: 5 mg via ORAL

## 2022-03-19 MED ORDER — BUPIVACAINE HCL (PF) 0.5 % IJ SOLN
INTRAMUSCULAR | Status: AC
  Filled 2022-03-19: qty 60

## 2022-03-19 MED ORDER — MENTHOL 3 MG MT LOZG: 1.0000 | LOZENGE | OROMUCOSAL | Status: DC | PRN

## 2022-03-19 MED ORDER — SUCCINYLCHOLINE CHLORIDE 200 MG/10ML IV SOSY
PREFILLED_SYRINGE | INTRAVENOUS | Status: DC | PRN
  Administered 2022-03-19: 100 mg via INTRAVENOUS

## 2022-03-19 MED ORDER — 0.9 % SODIUM CHLORIDE (POUR BTL) OPTIME
TOPICAL | Status: DC | PRN
  Administered 2022-03-19: 1000 mL

## 2022-03-19 MED ORDER — LIDOCAINE HCL (PF) 2 % IJ SOLN
INTRAMUSCULAR | Status: AC
  Filled 2022-03-19: qty 5

## 2022-03-19 MED ORDER — SODIUM CHLORIDE 0.9% FLUSH: 3.0000 mL | INTRAVENOUS | Status: DC | PRN

## 2022-03-19 MED ORDER — MIDAZOLAM HCL 2 MG/2ML IJ SOLN
INTRAMUSCULAR | Status: AC
  Filled 2022-03-19: qty 2

## 2022-03-19 MED ORDER — CARVEDILOL 12.5 MG PO TABS
12.5000 mg | ORAL_TABLET | Freq: Two times a day (BID) | ORAL | Status: DC
  Administered 2022-03-19 – 2022-03-22 (×6): 12.5 mg via ORAL
  Filled 2022-03-19 (×6): qty 1

## 2022-03-19 MED ORDER — IRRISEPT - 450ML BOTTLE WITH 0.05% CHG IN STERILE WATER, USP 99.95% OPTIME
TOPICAL | Status: DC | PRN
  Administered 2022-03-19: 450 mL

## 2022-03-19 MED ORDER — LACTATED RINGERS IV SOLN: INTRAVENOUS | Status: DC

## 2022-03-19 MED ORDER — GLYCOPYRROLATE 0.2 MG/ML IJ SOLN
INTRAMUSCULAR | Status: DC | PRN
  Administered 2022-03-19: .2 mg via INTRAVENOUS

## 2022-03-19 MED ORDER — CHLORHEXIDINE GLUCONATE 0.12 % MT SOLN
OROMUCOSAL | Status: AC
  Administered 2022-03-19: 15 mL via OROMUCOSAL
  Filled 2022-03-19: qty 15

## 2022-03-19 MED ORDER — BUPIVACAINE LIPOSOME 1.3 % IJ SUSP
INTRAMUSCULAR | Status: AC
  Filled 2022-03-19: qty 20

## 2022-03-19 MED ORDER — LIDOCAINE HCL (CARDIAC) PF 100 MG/5ML IV SOSY
PREFILLED_SYRINGE | INTRAVENOUS | Status: DC | PRN
  Administered 2022-03-19: 100 mg via INTRAVENOUS

## 2022-03-19 MED ORDER — SODIUM CHLORIDE 0.9 % IV SOLN: INTRAVENOUS | Status: DC

## 2022-03-19 MED ORDER — HYDRALAZINE HCL 20 MG/ML IJ SOLN
10.0000 mg | Freq: Once | INTRAMUSCULAR | Status: AC | PRN
  Administered 2022-03-19: 10 mg via INTRAVENOUS

## 2022-03-19 MED ORDER — ACETAMINOPHEN 10 MG/ML IV SOLN
INTRAVENOUS | Status: DC | PRN
  Administered 2022-03-19: 1000 mg via INTRAVENOUS

## 2022-03-19 MED ORDER — ACETAMINOPHEN 500 MG PO TABS
1000.0000 mg | ORAL_TABLET | Freq: Four times a day (QID) | ORAL | Status: DC
  Administered 2022-03-19 – 2022-03-22 (×9): 1000 mg via ORAL
  Filled 2022-03-19 (×10): qty 2

## 2022-03-19 MED ORDER — ROSUVASTATIN CALCIUM 5 MG PO TABS
5.0000 mg | ORAL_TABLET | Freq: Every day | ORAL | Status: DC
  Administered 2022-03-20 – 2022-03-22 (×3): 5 mg via ORAL
  Filled 2022-03-19 (×4): qty 1

## 2022-03-19 MED ORDER — PHENYLEPHRINE HCL-NACL 20-0.9 MG/250ML-% IV SOLN
INTRAVENOUS | Status: AC
  Filled 2022-03-19: qty 250

## 2022-03-19 MED ORDER — METHOCARBAMOL 1000 MG/10ML IJ SOLN
500.0000 mg | Freq: Four times a day (QID) | INTRAVENOUS | Status: DC | PRN
  Filled 2022-03-19: qty 5

## 2022-03-19 MED ORDER — FENTANYL CITRATE (PF) 100 MCG/2ML IJ SOLN
INTRAMUSCULAR | Status: DC | PRN
  Administered 2022-03-19 (×4): 50 ug via INTRAVENOUS
  Administered 2022-03-19: 100 ug via INTRAVENOUS

## 2022-03-19 MED ORDER — OXYCODONE HCL 5 MG/5ML PO SOLN: 5.0000 mg | Freq: Once | ORAL | Status: AC | PRN

## 2022-03-19 MED ORDER — PHENOL 1.4 % MT LIQD: 1.0000 | OROMUCOSAL | Status: DC | PRN

## 2022-03-19 MED ORDER — CEFAZOLIN SODIUM-DEXTROSE 2-4 GM/100ML-% IV SOLN
INTRAVENOUS | Status: AC
  Filled 2022-03-19: qty 100

## 2022-03-19 MED ORDER — EPINEPHRINE PF 1 MG/ML IJ SOLN
INTRAMUSCULAR | Status: AC
  Filled 2022-03-19: qty 1

## 2022-03-19 MED ORDER — REMIFENTANIL HCL 1 MG IV SOLR
INTRAVENOUS | Status: DC | PRN
  Administered 2022-03-19: .1 ug/kg/min via INTRAVENOUS

## 2022-03-19 MED ORDER — LISINOPRIL 5 MG PO TABS
2.5000 mg | ORAL_TABLET | ORAL | Status: DC
  Administered 2022-03-20 – 2022-03-22 (×3): 2.5 mg via ORAL
  Filled 2022-03-19 (×3): qty 1

## 2022-03-19 MED ORDER — OXYCODONE HCL 5 MG PO TABS
5.0000 mg | ORAL_TABLET | ORAL | Status: DC | PRN
  Administered 2022-03-21: 5 mg via ORAL
  Filled 2022-03-19: qty 1

## 2022-03-19 MED ORDER — OXYCODONE HCL 5 MG PO TABS
10.0000 mg | ORAL_TABLET | ORAL | Status: DC | PRN
  Administered 2022-03-20 – 2022-03-22 (×3): 10 mg via ORAL
  Filled 2022-03-19 (×3): qty 2

## 2022-03-19 MED ORDER — PROPOFOL 10 MG/ML IV BOLUS
INTRAVENOUS | Status: DC | PRN
  Administered 2022-03-19: 70 mg via INTRAVENOUS
  Administered 2022-03-19: 150 mg via INTRAVENOUS

## 2022-03-19 MED ORDER — METHOCARBAMOL 500 MG PO TABS
500.0000 mg | ORAL_TABLET | Freq: Four times a day (QID) | ORAL | Status: DC | PRN
  Administered 2022-03-20 – 2022-03-22 (×3): 500 mg via ORAL
  Filled 2022-03-19 (×4): qty 1

## 2022-03-19 MED ORDER — PROPOFOL 10 MG/ML IV BOLUS
INTRAVENOUS | Status: AC
  Filled 2022-03-19: qty 20

## 2022-03-19 MED ORDER — SODIUM CHLORIDE FLUSH 0.9 % IV SOLN
INTRAVENOUS | Status: AC
  Filled 2022-03-19: qty 20

## 2022-03-19 MED ORDER — MAGNESIUM CITRATE PO SOLN: 1.0000 | Freq: Once | ORAL | Status: DC | PRN

## 2022-03-19 MED ORDER — HYDRALAZINE HCL 20 MG/ML IJ SOLN
INTRAMUSCULAR | Status: AC
  Administered 2022-03-19: 10 mg via INTRAVENOUS
  Filled 2022-03-19: qty 1

## 2022-03-19 MED ORDER — FENTANYL CITRATE (PF) 100 MCG/2ML IJ SOLN
INTRAMUSCULAR | Status: AC
  Administered 2022-03-19: 50 ug via INTRAVENOUS
  Filled 2022-03-19: qty 2

## 2022-03-19 MED ORDER — ACETAMINOPHEN 10 MG/ML IV SOLN: 1000.0000 mg | Freq: Once | INTRAVENOUS | Status: DC | PRN

## 2022-03-19 MED ORDER — TORSEMIDE 20 MG PO TABS: 20.0000 mg | ORAL_TABLET | Freq: Every day | ORAL | Status: DC | PRN

## 2022-03-19 MED ORDER — SENNA 8.6 MG PO TABS
1.0000 | ORAL_TABLET | Freq: Two times a day (BID) | ORAL | Status: DC
  Administered 2022-03-19 – 2022-03-22 (×5): 8.6 mg via ORAL
  Filled 2022-03-19 (×7): qty 1

## 2022-03-19 MED ORDER — FAMOTIDINE 20 MG PO TABS
ORAL_TABLET | ORAL | Status: AC
  Administered 2022-03-19: 20 mg via ORAL
  Filled 2022-03-19: qty 1

## 2022-03-19 MED ORDER — ONDANSETRON HCL 4 MG/2ML IJ SOLN
INTRAMUSCULAR | Status: AC
  Filled 2022-03-19: qty 2

## 2022-03-19 MED ORDER — PHENYLEPHRINE HCL-NACL 20-0.9 MG/250ML-% IV SOLN
INTRAVENOUS | Status: DC | PRN
  Administered 2022-03-19: 15 ug/min via INTRAVENOUS

## 2022-03-19 MED ORDER — SODIUM CHLORIDE (PF) 0.9 % IJ SOLN
INTRAMUSCULAR | Status: DC | PRN
  Administered 2022-03-19: 60 mL

## 2022-03-19 MED ORDER — PROPOFOL 1000 MG/100ML IV EMUL
INTRAVENOUS | Status: AC
  Filled 2022-03-19: qty 100

## 2022-03-19 MED ORDER — BISACODYL 10 MG RE SUPP: 10.0000 mg | Freq: Every day | RECTAL | Status: DC | PRN

## 2022-03-19 MED ORDER — DEXAMETHASONE SODIUM PHOSPHATE 10 MG/ML IJ SOLN
INTRAMUSCULAR | Status: AC
  Filled 2022-03-19: qty 1

## 2022-03-19 MED ORDER — FENTANYL CITRATE (PF) 100 MCG/2ML IJ SOLN
25.0000 ug | INTRAMUSCULAR | Status: DC | PRN
  Administered 2022-03-19 (×2): 25 ug via INTRAVENOUS

## 2022-03-19 MED ORDER — ONDANSETRON HCL 4 MG/2ML IJ SOLN
INTRAMUSCULAR | Status: DC | PRN
  Administered 2022-03-19 (×2): 4 mg via INTRAVENOUS

## 2022-03-19 MED ORDER — REMIFENTANIL HCL 1 MG IV SOLR
INTRAVENOUS | Status: AC
  Filled 2022-03-19: qty 1000

## 2022-03-19 MED ORDER — ONDANSETRON HCL 4 MG/2ML IJ SOLN: 4.0000 mg | Freq: Once | INTRAMUSCULAR | Status: DC | PRN

## 2022-03-19 MED ORDER — HYDRALAZINE HCL 20 MG/ML IJ SOLN: 10.0000 mg | Freq: Once | INTRAMUSCULAR | Status: AC

## 2022-03-19 MED ORDER — ESMOLOL HCL 100 MG/10ML IV SOLN
INTRAVENOUS | Status: DC | PRN
  Administered 2022-03-19 (×2): 30 mg via INTRAVENOUS

## 2022-03-19 MED ORDER — ENOXAPARIN SODIUM 40 MG/0.4ML IJ SOSY
40.0000 mg | PREFILLED_SYRINGE | INTRAMUSCULAR | Status: DC
  Administered 2022-03-20 – 2022-03-22 (×3): 40 mg via SUBCUTANEOUS
  Filled 2022-03-19 (×3): qty 0.4

## 2022-03-19 MED ORDER — VANCOMYCIN HCL IN DEXTROSE 1-5 GM/200ML-% IV SOLN
INTRAVENOUS | Status: AC
  Administered 2022-03-19: 1000 mg via INTRAVENOUS
  Filled 2022-03-19: qty 200

## 2022-03-19 MED ORDER — SURGIFLO WITH THROMBIN (HEMOSTATIC MATRIX KIT) OPTIME
TOPICAL | Status: DC | PRN
  Administered 2022-03-19: 1 via TOPICAL

## 2022-03-19 MED ORDER — FINASTERIDE 5 MG PO TABS
5.0000 mg | ORAL_TABLET | ORAL | Status: DC
  Administered 2022-03-20 – 2022-03-22 (×3): 5 mg via ORAL
  Filled 2022-03-19 (×4): qty 1

## 2022-03-19 MED ORDER — POLYETHYLENE GLYCOL 3350 17 G PO PACK: 17.0000 g | PACK | Freq: Every day | ORAL | Status: DC | PRN

## 2022-03-19 MED ORDER — SUCCINYLCHOLINE CHLORIDE 200 MG/10ML IV SOSY
PREFILLED_SYRINGE | INTRAVENOUS | Status: AC
  Filled 2022-03-19: qty 10

## 2022-03-19 MED ORDER — BUPIVACAINE-EPINEPHRINE (PF) 0.5% -1:200000 IJ SOLN
INTRAMUSCULAR | Status: DC | PRN
  Administered 2022-03-19: 6.5 mL

## 2022-03-19 MED ORDER — FENTANYL CITRATE (PF) 100 MCG/2ML IJ SOLN
INTRAMUSCULAR | Status: AC
  Administered 2022-03-19: 25 ug via INTRAVENOUS
  Filled 2022-03-19: qty 2

## 2022-03-19 MED ORDER — ACETAMINOPHEN 10 MG/ML IV SOLN
INTRAVENOUS | Status: AC
  Filled 2022-03-19: qty 100

## 2022-03-19 MED ORDER — DEXAMETHASONE SODIUM PHOSPHATE 10 MG/ML IJ SOLN
INTRAMUSCULAR | Status: DC | PRN
  Administered 2022-03-19: 10 mg via INTRAVENOUS

## 2022-03-19 MED ORDER — GABAPENTIN 300 MG PO CAPS
600.0000 mg | ORAL_CAPSULE | Freq: Two times a day (BID) | ORAL | Status: DC
  Administered 2022-03-19 – 2022-03-22 (×6): 600 mg via ORAL
  Filled 2022-03-19 (×6): qty 2

## 2022-03-19 SURGICAL SUPPLY — 89 items
ADH SKN CLS APL DERMABOND .7 (GAUZE/BANDAGES/DRESSINGS) ×2
AGENT HMST KT MTR STRL THRMB (HEMOSTASIS) ×2
ALLOGRAFT BONE FIBER KORE 5 (Bone Implant) IMPLANT
ALLOGRAFT BONESTRIP KORE 2.5X5 (Bone Implant) IMPLANT
APL PRP STRL LF DISP 70% ISPRP (MISCELLANEOUS)
BASIN KIT SINGLE STR (MISCELLANEOUS) ×2 IMPLANT
BUR NEURO DRILL SOFT 3.0X3.8M (BURR) ×2 IMPLANT
CHLORAPREP W/TINT 26 (MISCELLANEOUS) ×2 IMPLANT
CNTNR URN SCR LID CUP LEK RST (MISCELLANEOUS) ×2 IMPLANT
CONT SPEC 4OZ STRL OR WHT (MISCELLANEOUS) ×2
COVERAGE SUPP BRAINLAB NG SPNE (MISCELLANEOUS) IMPLANT
COVERAGE SUPPORT SPINE BRAINLB (MISCELLANEOUS)
CUP MEDICINE 2OZ PLAST GRAD ST (MISCELLANEOUS) ×2 IMPLANT
DERMABOND ADVANCED .7 DNX12 (GAUZE/BANDAGES/DRESSINGS) ×2 IMPLANT
DRAPE 3D C-ARM OEC (DRAPES) IMPLANT
DRAPE C ARM PK CFD 31 SPINE (DRAPES) IMPLANT
DRAPE C-ARMOR (DRAPES) IMPLANT
DRAPE INCISE IOBAN 66X45 STRL (DRAPES) ×2 IMPLANT
DRAPE LAPAROTOMY 100X77 ABD (DRAPES) ×2 IMPLANT
DRAPE MICROSCOPE SPINE 48X150 (DRAPES) IMPLANT
DRAPE SCAN PATIENT (DRAPES) ×2 IMPLANT
DRSG OPSITE POSTOP 3X4 (GAUZE/BANDAGES/DRESSINGS) IMPLANT
DRSG OPSITE POSTOP 4X6 (GAUZE/BANDAGES/DRESSINGS) IMPLANT
DRSG OPSITE POSTOP 4X8 (GAUZE/BANDAGES/DRESSINGS) IMPLANT
DRSG TEGADERM 4X4.75 (GAUZE/BANDAGES/DRESSINGS) IMPLANT
ELECT CAUTERY BLADE TIP 2.5 (TIP) ×2
ELECT EZSTD 165MM 6.5IN (MISCELLANEOUS)
ELECT REM PT RETURN 9FT ADLT (ELECTROSURGICAL) ×2
ELECTRODE CAUTERY BLDE TIP 2.5 (TIP) ×2 IMPLANT
ELECTRODE EZSTD 165MM 6.5IN (MISCELLANEOUS) IMPLANT
ELECTRODE REM PT RTRN 9FT ADLT (ELECTROSURGICAL) ×2 IMPLANT
EX-PIN ORTHOLOCK NAV 4X150 (PIN) IMPLANT
FEE CVG SUPP BRAINLAB NG SPNE (MISCELLANEOUS) IMPLANT
FEE INTRAOP CADWELL SUPPLY NCS (MISCELLANEOUS) IMPLANT
FEE INTRAOP MONITOR IMPULS NCS (MISCELLANEOUS) IMPLANT
GAUZE 4X4 16PLY ~~LOC~~+RFID DBL (SPONGE) ×2 IMPLANT
GAUZE SPONGE 2X2 8PLY STRL LF (GAUZE/BANDAGES/DRESSINGS) IMPLANT
GAUZE SPONGE 2X2 STRL 8-PLY (GAUZE/BANDAGES/DRESSINGS) ×2 IMPLANT
GLOVE BIOGEL PI IND STRL 6.5 (GLOVE) ×4 IMPLANT
GLOVE SURG SYN 6.5 ES PF (GLOVE) ×12 IMPLANT
GLOVE SURG SYN 6.5 PF PI (GLOVE) ×4 IMPLANT
GLOVE SURG SYN 8.5  E (GLOVE) ×8
GLOVE SURG SYN 8.5 E (GLOVE) ×8 IMPLANT
GLOVE SURG SYN 8.5 PF PI (GLOVE) ×8 IMPLANT
GOWN SRG LRG LVL 4 IMPRV REINF (GOWNS) ×6 IMPLANT
GOWN SRG XL LVL 3 NONREINFORCE (GOWNS) ×2 IMPLANT
GOWN STRL NON-REIN TWL XL LVL3 (GOWNS) ×2
GOWN STRL REIN LRG LVL4 (GOWNS) ×4
GUIDEWIRE NITINOL BEVEL TIP (WIRE) IMPLANT
HEMOVAC 400CC 10FR (MISCELLANEOUS) IMPLANT
HOLDER FOLEY CATH W/STRAP (MISCELLANEOUS) IMPLANT
INTERBODY SABLE 10X26 7-14 15D (Miscellaneous) IMPLANT
INTRAOP CADWELL SUPPLY FEE NCS (MISCELLANEOUS)
INTRAOP DISP SUPPLY FEE NCS (MISCELLANEOUS)
INTRAOP MONITOR FEE IMPULS NCS (MISCELLANEOUS)
INTRAOP MONITOR FEE IMPULSE (MISCELLANEOUS)
JET LAVAGE IRRISEPT WOUND (IRRIGATION / IRRIGATOR) ×2
KIT PREVENA INCISION MGT 13 (CANNISTER) IMPLANT
KIT SPINAL PRONEVIEW (KITS) ×2 IMPLANT
KNIFE BAYONET SHORT DISCETOMY (MISCELLANEOUS) IMPLANT
LAVAGE JET IRRISEPT WOUND (IRRIGATION / IRRIGATOR) IMPLANT
MANIFOLD NEPTUNE II (INSTRUMENTS) ×2 IMPLANT
MARKER SKIN DUAL TIP RULER LAB (MISCELLANEOUS) ×2 IMPLANT
MARKER SPHERE PSV REFLC 13MM (MARKER) ×14 IMPLANT
NDL SAFETY ECLIP 18X1.5 (MISCELLANEOUS) ×2 IMPLANT
NS IRRIG 1000ML POUR BTL (IV SOLUTION) ×2 IMPLANT
PACK LAMINECTOMY NEURO (CUSTOM PROCEDURE TRAY) ×2 IMPLANT
ROD RELINE MAS LORD 5.5X50 (Rod) IMPLANT
SCREW LOCK RELINE 5.5 TULIP (Screw) IMPLANT
SCREW MAS RED RELINE 6.5X55 (Screw) IMPLANT
SCREW RELINE RED 6.5X50MM POLY (Screw) IMPLANT
STAPLER SKIN PROX 35W (STAPLE) IMPLANT
SURGIFLO W/THROMBIN 8M KIT (HEMOSTASIS) ×2 IMPLANT
SUT DVC VLOC 3-0 CL 6 P-12 (SUTURE) ×2 IMPLANT
SUT ETHILON 3-0 FS-10 30 BLK (SUTURE) ×2
SUT MNCRL 4-0 (SUTURE) ×2
SUT MNCRL 4-0 27XMFL (SUTURE) ×2
SUT VIC AB 0 CT1 27 (SUTURE) ×4
SUT VIC AB 0 CT1 27XCR 8 STRN (SUTURE) ×2 IMPLANT
SUT VIC AB 2-0 CT1 18 (SUTURE) ×2 IMPLANT
SUTURE EHLN 3-0 FS-10 30 BLK (SUTURE) IMPLANT
SUTURE MNCRL 4-0 27XMF (SUTURE) IMPLANT
SYR 10ML LL (SYRINGE) ×2 IMPLANT
SYR 30ML LL (SYRINGE) ×2 IMPLANT
TOWEL OR 17X26 4PK STRL BLUE (TOWEL DISPOSABLE) ×2 IMPLANT
TRAP FLUID SMOKE EVACUATOR (MISCELLANEOUS) ×2 IMPLANT
TRAY FOLEY SLVR 16FR LF STAT (SET/KITS/TRAYS/PACK) IMPLANT
TROCAR INSERT W/PEDICLE NDL (TROCAR) IMPLANT
WATER STERILE IRR 1000ML POUR (IV SOLUTION) ×2 IMPLANT

## 2022-03-19 NOTE — Transfer of Care (Signed)
Immediate Anesthesia Transfer of Care Note  Patient: Michael Humphrey  Procedure(s) Performed: L4-S1 MINIMALLY INVASIVE (MIS) TRANSFORAMINAL LUMBAR INTERBODY FUSION (TLIF) (Back) APPLICATION OF INTRAOPERATIVE CT SCAN  Patient Location: PACU  Anesthesia Type:General  Level of Consciousness: awake, drowsy, and patient cooperative  Airway & Oxygen Therapy: Patient Spontanous Breathing and Patient connected to face mask oxygen  Post-op Assessment: Report given to RN and Post -op Vital signs reviewed and stable  Post vital signs: Reviewed and stable  Last Vitals:  Vitals Value Taken Time  BP    Temp    Pulse 74 03/19/22 1906  Resp    SpO2 99 % 03/19/22 1906  Vitals shown include unvalidated device data.  Last Pain:  Vitals:   03/19/22 1210  TempSrc: Oral  PainSc: 0-No pain         Complications: No notable events documented.

## 2022-03-19 NOTE — Anesthesia Preprocedure Evaluation (Addendum)
Anesthesia Evaluation  Patient identified by MRN, date of birth, ID band Patient awake    Reviewed: Allergy & Precautions, NPO status , Patient's Chart, lab work & pertinent test results  History of Anesthesia Complications Negative for: history of anesthetic complications  Airway Mallampati: II  TM Distance: >3 FB Neck ROM: Limited    Dental  (+) Upper Dentures, Lower Dentures   Pulmonary neg pulmonary ROS, neg sleep apnea, neg COPD, Patient abstained from smoking.Not current smoker   Pulmonary exam normal breath sounds clear to auscultation       Cardiovascular Exercise Tolerance: Good METShypertension, + angina  + CAD and + Past MI  (-) dysrhythmias  Rhythm:Regular Rate:Normal - Systolic murmurs Per cardiologist note Feb 2024, deemed optimized for anesthesia : 72 year old male with multiple cardiovascular risk factors, history of chest pain with atypical features, and Lexiscan Myoview which revealed mildly left ventricular function with inferoseptal scar without significant ischemia. The patient returned with chief complaint of exertional chest pain and shortness of breath. Patient unable to tolerate nitrates. The patient has essential hypertension, blood pressure well controlled on current BP medications. Repeat Lexiscan Myoview 05/17/2021 revealed LVEF 51% with mild to moderate inferoapical and septal ischemia. The patient underwent cardiac catheterization on 06/08/2021 which revealed chronically occluded mid LAD, 75% stenosis mid to distal PDA, and 75% stenosis RPL2, felt best to be treated medically.   Neuro/Psych  Neuromuscular disease CVA, No Residual Symptoms  negative psych ROS   GI/Hepatic ,GERD  Controlled,,(+)     (-) substance abuse    Endo/Other  diabetes    Renal/GU negative Renal ROS     Musculoskeletal  (+) Arthritis ,    Abdominal   Peds  Hematology   Anesthesia Other Findings Past Medical History: No  date: Anginal pain ([place]) No date: Arthritis No date: CAD (coronary artery disease)     Comment:  a.) LHC 08/22/2012: normal coronaries; b.) LHC               06/08/2021: EF 55-65%. 30% oLM, 100% p-mLAD, 50% o-pLAD,               60% pLAD, 30% pRCA, 75% RPDA, 75% RPAV -- med mgmt. 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 No date: DDD (degenerative disc disease), lumbar No date: Diabetic retinopathy ([place]) No date: DISH (diffuse idiopathic skeletal hyperostosis) No date: Diverticulitis No date: Full dentures No date: GERD (gastroesophageal reflux disease) No date: Heart attack ([place]) No date: High cholesterol 12/09/2019: Hordeolum internum right lower eyelid No date: Hypertension No date: Incomplete right bundle branch block (RBBB) No date: Lumbar radiculitis     Comment:  a.) s/p L4-L5 decompression No date: Lumbar radiculopathy 09/26/2020: Posterior capsular opacification, right     Comment:  Found by Dr. Orion Modest recently and sent here No date:  Pulmonary embolism (HCC) No date: Skin cancer No date: Sleep apnea     Comment:  a.) unable to tolerate nocturnal PAP therapy No date: Spinal stenosis of lumbar region, unspecified whether  neurogenic claudication present No date: Statin intolerance No date: T2DM (type 2 diabetes mellitus) ([place]) No date: Tubular adenoma of colon  Reproductive/Obstetrics                             Anesthesia Physical Anesthesia Plan  ASA: 3  Anesthesia Plan: General    Post-op Pain Management: Ofirmev IV (intra-op)* and Dilaudid IV   Induction: Intravenous  PONV Risk Score and Plan: 3 and Ondansetron, Dexamethasone and Treatment may vary due to age or medical condition  Airway Management Planned: Oral ETT and Video Laryngoscope Planned  Additional Equipment: None  Intra-op Plan:   Post-operative Plan: Extubation in OR  Informed Consent: I have reviewed the patients History and Physical, chart, labs and discussed the procedure including the risks, benefits and alternatives for the proposed anesthesia with the patient or authorized representative who has indicated his/her understanding and acceptance.     Dental advisory given  Plan Discussed with: CRNA and Surgeon  Anesthesia Plan Comments: (Discussed risks of anesthesia with patient, including PONV, sore throat, lip/dental/eye damage. Rare risks discussed as well, such as cardiorespiratory and neurological sequelae, and allergic reactions. Discussed the role of CRNA in patient's perioperative care. Patient understands.)       Anesthesia Quick Evaluation

## 2022-03-19 NOTE — Anesthesia Postprocedure Evaluation (Signed)
Anesthesia Post Note  Patient: Michael Humphrey  Procedure(s) Performed: L4-S1 MINIMALLY INVASIVE (MIS) TRANSFORAMINAL LUMBAR INTERBODY FUSION (TLIF) (Back) APPLICATION OF INTRAOPERATIVE CT SCAN  Patient location during evaluation: PACU Anesthesia Type: General Level of consciousness: awake and alert, oriented and patient cooperative Pain management: pain level controlled Vital Signs Assessment: post-procedure vital signs reviewed and stable Respiratory status: spontaneous breathing, nonlabored ventilation and respiratory function stable Cardiovascular status: blood pressure returned to baseline and stable Postop Assessment: adequate PO intake Anesthetic complications: no   No notable events documented.   Last Vitals:  Vitals:   03/19/22 2103 03/19/22 2153  BP: 132/85 (!) 158/94  Pulse: 84 92  Resp:  16  Temp:  36.4 C  SpO2: 96% 97%    Last Pain:  Vitals:   03/19/22 2100  TempSrc:   PainSc: Asleep                 Darrin Nipper

## 2022-03-19 NOTE — Anesthesia Procedure Notes (Signed)
Procedure Name: Intubation Date/Time: 03/19/2022 3:27 PM  Performed by: Kelton Pillar, CRNAPre-anesthesia Checklist: Patient identified, Emergency Drugs available, Suction available and Patient being monitored Patient Re-evaluated:Patient Re-evaluated prior to induction Oxygen Delivery Method: Circle system utilized Preoxygenation: Pre-oxygenation with 100% oxygen Induction Type: IV induction Ventilation: Mask ventilation without difficulty Laryngoscope Size: McGraph and 3 Grade View: Grade I Tube type: Oral Tube size: 7.0 mm Number of attempts: 1 Airway Equipment and Method: Stylet and Oral airway Placement Confirmation: ETT inserted through vocal cords under direct vision, positive ETCO2, breath sounds checked- equal and bilateral and CO2 detector Secured at: 21 cm Tube secured with: Tape Dental Injury: Teeth and Oropharynx as per pre-operative assessment

## 2022-03-19 NOTE — Interval H&P Note (Signed)
History and Physical Interval Note:  03/19/2022 2:09 PM  Michael Humphrey  has presented today for surgery, with the diagnosis of M54.42, G89.29 Chronic bilateral low back pain with left-sided sciatica.  The various methods of treatment have been discussed with the patient and family. After consideration of risks, benefits and other options for treatment, the patient has consented to  Procedure(s): L4-S1 MINIMALLY INVASIVE (MIS) TRANSFORAMINAL LUMBAR INTERBODY FUSION (TLIF) (N/A) APPLICATION OF INTRAOPERATIVE CT SCAN (N/A) as a surgical intervention.  The patient's history has been reviewed, patient examined, no change in status, stable for surgery.  I have reviewed the patient's chart and labs.  Questions were answered to the patient's satisfaction.    Heart sounds normal no MRG. Chest Clear to Auscultation Bilaterally.   [name]

## 2022-03-19 NOTE — Op Note (Signed)
Indications: Mr. Galinsky is a 72 yo male who presented with: M54.42, G89.29 Chronic bilateral low back pain with left-sided sciatica   He failed conservative management prompting surgical intervention.  Findings: stenosis  Preoperative Diagnosis: M54.42, G89.29 Chronic bilateral low back pain with left-sided sciatica  Postoperative Diagnosis: same   EBL: 100 ml IVF: see AR ml Drains: 1 placed Disposition: Extubated and Stable to PACU Complications: none  A foley catheter was placed.   Preoperative Note:   Risks of surgery discussed include: infection, bleeding, stroke, coma, death, paralysis, CSF leak, nerve/spinal cord injury, numbness, tingling, weakness, complex regional pain syndrome, recurrent stenosis and/or disc herniation, vascular injury, development of instability, neck/back pain, need for further surgery, persistent symptoms, development of deformity, and the risks of anesthesia. The patient understood these risks and agreed to proceed.  Operative Note:  1. Transforaminal Lumbar Interbody Fusion L4/5 and L5/S1 2. Posterolateral arthrodesis L4 to S1 3. Posterior segmental instrumentation L4 to S1 using Nuvasive Reline 4. Use of stereotaxis ([employer]) 5. Harvesting of autograft via the same incision 6. Placement of a biomechanical device ([place]) at L4/5 and L5/S1 for anterior arthrodesis  The patient was brought to the Operating Room, intubated and turned into the prone position. All pressure points were checked and double checked. The patient was prepped and draped in the standard fashion. A full timeout was performed. Preoperative antibiotics were given.   After draping, the stereotactic array was placed.  Stereotactic imaging was acquired and registered to the [place] system.  The image guidance system was used to choose bilateral [place] incisions. The incisions were injected with local anesthetic.  Each incision was opened with a scalpel, bovie electrocautery  was used to open the fascia.  Using stereotactic guidance, each pedicle from L4-S1 was cannulated and K wire secured.  We then placed pedicle screws over each K wire. Nuvasive Reline screws were used bilaterally at each level. We placed the R screws first, then left the L screws to place after the TLIF procedures.  After placement of pedicle screws, we then turned our attention to transforaminal lumbar interbody fusion.    A tubular retractor was advanced over the left L5/S1 facet. The microscope was brought into the field.  The left L5/S1 facet was removed with osteotomes and the drill, and handed off for preparation as autograft. The traversing and exiting nerve roots on the left were identified and protected. The disc was opened using a scalpel. After incising the disc space, we took a combination of pituitary rongeurs, Kerrison rongeurs, disc scrapers, and curettes to remove a majority of the disc material.  We prepared the end plates for accepting the interbody fusion.  We removed the cartilaginous plate, preserved the cortical endplate if possible during this procedure.  The disc space was irrigated.  The Globus Sable biomechanical device was inserted, then backfilled with a mixture of allograft and autograft. During placement, the nerve roots and dural sac were carefully protected without any leaks identified. After placement of the device, the canal was fully inspected and all nerve roots were checked to ensure full decompression.  The retractor was removed.  We then moved the retractor to L4/5. A tubular retractor was advanced over the left L4/5 facet. The microscope was brought into the field.  The left L4/5 facet was removed with osteotomes and the drill, and handed off for preparation as autograft. The traversing and exiting nerve roots on the left were identified and protected. The disc was opened using a scalpel. After  incising the disc space, we took a combination of pituitary rongeurs,  Kerrison rongeurs, disc scrapers, and curettes to remove a majority of the disc material.  We prepared the end plates for accepting the interbody fusion.  We removed the cartilaginous plate, preserved the cortical endplate if possible during this procedure.  The disc space was irrigated.  The Globus Sable biomechanical device was inserted, then backfilled with a mixture of allograft and autograft. During placement, the nerve roots and dural sac were carefully protected without any leaks identified. After placement of the device, the canal was fully inspected and all nerve roots were checked to ensure full decompression.  The retractor was removed. Rods measured and placed. The rods were secured using locking caps to manufacturer's specifications. CT scan was taken to confirm instrumentation placement. The wound was copiously irrigated, then the posterior elements were prepared for arthrodesis.  A mixture of allograft and autograft was placed over the decorticated surfaces for arthrodesis.   A subfascial drain was placed on the left.  After hemostasis, the wound was closed in layers with 0 and 2-0 vicryl. 3-0 monocryl and dermabond was applied to the incision. A sterile dressing was placed.  The patient was then flipped supine and extubated with incident. All counts were correct times 2 at the end of the case. No immediate complications were noted.  Cooper Render PA assisted in the entire procedure. An assistant was required for this procedure due to the complexity.  The assistant provided assistance in tissue manipulation and suction, and was required for the successful and safe performance of the procedure. I performed the critical portions of the procedure.   Meade Maw MD

## 2022-03-20 ENCOUNTER — Encounter: Payer: Self-pay | Admitting: Neurosurgery

## 2022-03-20 ENCOUNTER — Ambulatory Visit: Payer: PPO | Admitting: Neurosurgery

## 2022-03-20 LAB — GLUCOSE, CAPILLARY
Glucose-Capillary: 204 mg/dL — ABNORMAL HIGH (ref 70–99)
Glucose-Capillary: 227 mg/dL — ABNORMAL HIGH (ref 70–99)
Glucose-Capillary: 193 mg/dL — ABNORMAL HIGH (ref 70–99)
Glucose-Capillary: 199 mg/dL — ABNORMAL HIGH (ref 70–99)

## 2022-03-20 MED ORDER — INSULIN ASPART 100 UNIT/ML IJ SOLN
0.0000 [IU] | Freq: Three times a day (TID) | INTRAMUSCULAR | Status: DC
  Administered 2022-03-20 (×2): 5 [IU] via SUBCUTANEOUS
  Administered 2022-03-21: 2 [IU] via SUBCUTANEOUS
  Administered 2022-03-22: 3 [IU] via SUBCUTANEOUS
  Filled 2022-03-20 (×4): qty 1

## 2022-03-20 MED ORDER — INSULIN ASPART 100 UNIT/ML IJ SOLN: 0.0000 [IU] | Freq: Every day | INTRAMUSCULAR | Status: DC

## 2022-03-20 NOTE — Plan of Care (Signed)
  Problem: Education: Goal: Ability to verbalize activity precautions or restrictions will improve Outcome: Progressing Goal: Knowledge of the prescribed therapeutic regimen will improve Outcome: Progressing Goal: Understanding of discharge needs will improve Outcome: Progressing   Problem: Activity: Goal: Ability to avoid complications of mobility impairment will improve Outcome: Progressing Goal: Ability to tolerate increased activity will improve Outcome: Progressing Goal: Will remain free from falls Outcome: Progressing   Problem: Bowel/Gastric: Goal: Gastrointestinal status for postoperative course will improve Outcome: Progressing   Problem: Clinical Measurements: Goal: Ability to maintain clinical measurements within normal limits will improve Outcome: Progressing Goal: Postoperative complications will be avoided or minimized Outcome: Progressing Goal: Diagnostic test results will improve Outcome: Progressing   Problem: Pain Management: Goal: Pain level will decrease Outcome: Progressing   Problem: Skin Integrity: Goal: Will show signs of wound healing Outcome: Progressing   Problem: Health Behavior/Discharge Planning: Goal: Identification of resources available to assist in meeting health care needs will improve Outcome: Progressing   Problem: Bladder/Genitourinary: Goal: Urinary functional status for postoperative course will improve Outcome: Progressing   Problem: Education: Goal: Knowledge of General Education information will improve Description: Including pain rating scale, medication(s)/side effects and non-pharmacologic comfort measures Outcome: Progressing   Problem: Health Behavior/Discharge Planning: Goal: Ability to manage health-related needs will improve Outcome: Progressing   Problem: Clinical Measurements: Goal: Ability to maintain clinical measurements within normal limits will improve Outcome: Progressing Goal: Will remain free from  infection Outcome: Progressing Goal: Diagnostic test results will improve Outcome: Progressing Goal: Respiratory complications will improve Outcome: Progressing Goal: Cardiovascular complication will be avoided Outcome: Progressing   Problem: Activity: Goal: Risk for activity intolerance will decrease Outcome: Progressing   Problem: Nutrition: Goal: Adequate nutrition will be maintained Outcome: Progressing   Problem: Coping: Goal: Level of anxiety will decrease Outcome: Progressing   Problem: Elimination: Goal: Will not experience complications related to bowel motility Outcome: Progressing Goal: Will not experience complications related to urinary retention Outcome: Progressing   Problem: Pain Managment: Goal: General experience of comfort will improve Outcome: Progressing   Problem: Safety: Goal: Ability to remain free from injury will improve Outcome: Progressing   Problem: Skin Integrity: Goal: Risk for impaired skin integrity will decrease Outcome: Progressing   Problem: Education: Goal: Ability to describe self-care measures that may prevent or decrease complications (Diabetes Survival Skills Education) will improve Outcome: Progressing Goal: Individualized Educational Video(s) Outcome: Progressing   Problem: Coping: Goal: Ability to adjust to condition or change in health will improve Outcome: Progressing   Problem: Fluid Volume: Goal: Ability to maintain a balanced intake and output will improve Outcome: Progressing   Problem: Health Behavior/Discharge Planning: Goal: Ability to identify and utilize available resources and services will improve Outcome: Progressing Goal: Ability to manage health-related needs will improve Outcome: Progressing   Problem: Metabolic: Goal: Ability to maintain appropriate glucose levels will improve Outcome: Progressing   Problem: Nutritional: Goal: Maintenance of adequate nutrition will improve Outcome:  Progressing Goal: Progress toward achieving an optimal weight will improve Outcome: Progressing   Problem: Skin Integrity: Goal: Risk for impaired skin integrity will decrease Outcome: Progressing   Problem: Tissue Perfusion: Goal: Adequacy of tissue perfusion will improve Outcome: Progressing   

## 2022-03-20 NOTE — Progress Notes (Signed)
    Attending Progress Note  History: Michael Humphrey is s/p L4-S1 TLIF and PSF  POD1: Patient reports significant improvement in his left leg pain from preop.  He has ambulated to the door.  Physical Exam: Vitals:   03/20/22 0127 03/20/22 0500  BP: (!) 147/98 (!) 140/95  Pulse: 97 86  Resp: 18 20  Temp: 97.8 F (36.6 C) 97.6 F (36.4 C)  SpO2: 97% 98%    AA Ox3 CNI  Strength:5/5 throughout BLE  HV output 160 since surgery   Data:  Other tests/results: none   Assessment/Plan:  Michael Humphrey is a 72 y.o presenting with back pain and left sided sciatica s/p L4-S1 fusion  - mobilize - pain control - DVT prophylaxis -Continue to monitor drain output - PTOT; set up with [facility] preop  Cooper Render PA-C Department of Neurosurgery

## 2022-03-20 NOTE — TOC Progression Note (Signed)
Transition of Care [facility]) - Progression Note    Patient Details  Name: Michael Humphrey MRN: [id number] Date of Birth: Jun 21, 1950  Transition of Care [facility]) CM/SW [place], RN Phone Number: 03/20/2022, 12:27 PM  Clinical Narrative:     The patient is from home The patient has DME at home including [employer] - single Associate Professor (2 wheels) Additional Comments: uses RW in the mornings and SPC later in the day  He is set up prior to surgery by the surgeons office with [facility]   Expected Discharge Plan: [place] Barriers to Discharge: No Barriers Identified  Expected Discharge Plan and Services   Discharge Planning Services: CM Consult   Living arrangements for the past 2 months: Single Family Home                 DME Arranged: N/A DME Agency: NA       HH Arranged: PT HH Agency: [place] Date [facility] Agency Contacted: 03/20/22 Time HH Agency Contacted: [id number] Representative spoke with at [place]: [place] Determinants of Health ([place]) Interventions SDOH Screenings   Food Insecurity: No Food Insecurity (03/20/2022)  Housing: [place]  (03/20/2022)  Transportation Needs: No Transportation Needs (03/20/2022)  Utilities: Not At Risk (03/20/2022)  Tobacco Use: Medium Risk (03/20/2022)    Readmission Risk Interventions     No data to display

## 2022-03-20 NOTE — Plan of Care (Signed)
  Problem: Activity: Goal: Ability to avoid complications of mobility impairment will improve Outcome: Progressing   

## 2022-03-20 NOTE — Evaluation (Signed)
Physical Therapy Evaluation Patient Details Name: Michael Humphrey MRN: [id number] DOB: 04/08/1950 Today's Date: 03/20/2022  History of Present Illness  Pt i a 72 y.o. male s s/p L4-S1 fusion 03/19/22.  Clinical Impression  Pt admitted with above diagnosis. Pt received upright in bed agreeable to PT with spouse present in room. 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   To date, pt educated on log roll technique, when indicated to don LSO. Pt required no physical assist to exit bed but general min to mod multimodal cuing for log roll technique. With set up assist pt is able to don LSO in sitting. Pt relies on bed elevated to stand to RW and minguard as pt relies on lift chair at home. Pt with crouched posture and use of RW completing > 100' at minguard ranging to supervision level. Pt generally slow with gait cadence but safe use of RW and no evidence of knee buckling despite LE joint limitations. Very minimal cuing needed for RW with great carryover after cuing to stay closer to RW's BOS.  Pt returned to recliner with all needs in reach and LSO remained donned. Pt currently with functional limitations due to the deficits listed below (see PT Problem List). Pt will benefit from skilled PT to increase their independence and safety with mobility to allow discharge to the venue listed below.      Recommendations for follow up therapy are one component of a multi-disciplinary discharge planning process, led by the attending physician.  Recommendations may be updated based on patient status, additional functional criteria and insurance authorization.  Follow Up Recommendations Home health PT      Assistance Recommended at Discharge Frequent or constant Supervision/Assistance  Patient can return home with the following  A little help with walking and/or transfers;A little help  with bathing/dressing/bathroom;Assistance with cooking/housework;Assist for transportation;Help with stairs or ramp for entrance    Equipment Recommendations None recommended by PT  Recommendations for Other Services       Functional Status Assessment Patient has had a recent decline in their functional status and demonstrates the ability to make significant improvements in function in a reasonable and predictable amount of time.     Precautions / Restrictions Precautions Precautions: Back;Fall Precaution Booklet Issued: No Restrictions Weight Bearing Restrictions: No      Mobility  Bed Mobility Overal bed mobility: Needs Assistance Bed Mobility: Supine to Sit     Supine to sit: Min guard, HOB elevated     General bed mobility comments: cuing for log roll technique. Needs bed features. Patient Response: Cooperative  Transfers Overall transfer level: Needs assistance Equipment used: Rolling [place] (2 wheels) Transfers: Sit to/from Stand Sit to Stand: Min guard, From elevated surface           General transfer comment: pt uses lift chair at baseline. Bed elevated to similar heights pt stands to at home.    Ambulation/Gait Ambulation/Gait assistance: Supervision Gait Distance (Feet): 115 Feet Assistive device: Rolling [place] (2 wheels) Gait Pattern/deviations: Step-through pattern, Decreased step length - right, Decreased step length - left, Knee flexed in stance - right, Knee flexed in stance - left, Narrow base of support, Trunk flexed       General Gait Details: steady with RW use. Min VC's to maintain body closer to BOS of RW with good carryover. Despite knees  flexed in stance phase no buckling of knees identified with gait or transfers.  Stairs            Wheelchair Mobility    Modified Rankin (Stroke Patients Only)       Balance Overall balance assessment: Needs assistance Sitting-balance support: No upper extremity supported, Feet  supported Sitting balance-Leahy Scale: Good Sitting balance - Comments: able to maintain sitting donning brace   Standing balance support: During functional activity, Bilateral upper extremity supported Standing balance-Leahy Scale: Fair Standing balance comment: use of RW                             Pertinent Vitals/Pain Pain Assessment Pain Assessment: Faces Faces Pain Scale: Hurts a little bit Pain Location: back incision Pain Descriptors / Indicators: Aching, Discomfort Pain Intervention(s): Limited activity within patient's tolerance, Monitored during session, Repositioned    Home Living Family/patient expects to be discharged to:: Private residence Living Arrangements: Spouse/significant other Available Help at Discharge: Family;Available 24 hours/day Type of Home: House Home Access: Ramped entrance       Home Layout: One level Home Equipment: Cane - single Associate Professor (2 wheels) Additional Comments: uses RW in the mornings and SPC later in the day    Prior Function Prior Level of Function : Independent/Modified Independent                     Hand Dominance        Extremity/Trunk Assessment   Upper Extremity Assessment Upper Extremity Assessment: Defer to OT evaluation    Lower Extremity Assessment Lower Extremity Assessment: Generalized weakness;RLE deficits/detail;LLE deficits/detail RLE Deficits / Details: Limited hip/knee extension in standing RLE Sensation: WNL LLE Deficits / Details: Limited hip/knee extension in standing LLE Sensation: WNL    Cervical / Trunk Assessment Cervical / Trunk Assessment: Kyphotic;Back Surgery  Communication   Communication: No difficulties  Cognition Arousal/Alertness: Awake/alert Behavior During Therapy: WFL for tasks assessed/performed Overall Cognitive Status: Within Functional Limits for tasks assessed                                          General  Comments      Exercises Other Exercises Other Exercises: Role of PT in acute setting, log roll technique, general BLT precautions, reviewed donning LSO and when indicated to don. Safe use of RW   Assessment/Plan    PT Assessment Patient needs continued PT services  PT Problem List Decreased strength;Decreased activity tolerance;Decreased knowledge of use of DME;Decreased balance       PT Treatment Interventions DME instruction;Therapeutic exercise;Gait training;Balance training;Stair training;Neuromuscular re-education;Functional mobility training;Therapeutic activities;Patient/family education    PT Goals (Current goals can be found in the Care Plan section)  Acute Rehab PT Goals Patient Stated Goal: to go home; return to mowing the lawn PT Goal Formulation: With patient/family Time For Goal Achievement: 04/03/22 Potential to Achieve Goals: Good    Frequency 7X/week     Co-evaluation               AM-PAC PT "6 Clicks" Mobility  Outcome Measure Help needed turning from your back to your side while in a flat bed without using bedrails?: A Little Help needed moving from lying on your back to sitting on the side of a flat bed without using bedrails?: A Little Help needed moving to  and from a bed to a chair (including a wheelchair)?: A Little Help needed standing up from a chair using your arms (e.g., wheelchair or bedside chair)?: A Lot Help needed to walk in hospital room?: A Little Help needed climbing 3-5 steps with a railing? : A Lot 6 Click Score: 16    End of Session Equipment Utilized During Treatment: Gait belt;Back brace Activity Tolerance: Patient tolerated treatment well Patient left: in chair;with call bell/phone within reach;with chair alarm set;with family/visitor present Nurse Communication: Mobility status PT Visit Diagnosis: Unsteadiness on feet (R26.81);Muscle weakness (generalized) (M62.81);Difficulty in walking, not elsewhere classified (R26.2)     Time: [id number] PT Time Calculation (min) (ACUTE ONLY): 27 min   Charges:     PT Treatments $Gait Training: 8-22 mins        Salem Caster. Fairly IV, PT, DPT Physical Therapist- [facility]  03/20/2022, 10:44 AM

## 2022-03-20 NOTE — Evaluation (Signed)
Occupational Therapy Evaluation Patient Details Name: Michael Humphrey MRN: [id number] DOB: 02-Apr-1950 Today's Date: 03/20/2022   History of Present Illness Pt is a 72 y.o. male s s/p L4-S1 fusion 03/19/22.   Clinical Impression   Patient agreeable to OT evaluation. Spouse present. Pt presenting with decreased independence in self care, balance, functional mobility/transfers, and endurance. PTA pt lived with spouse, was independent for ADLs/IADLs, and Mod I for functional mobility using a RW or SPC. Pt currently functioning at [place] guard for bed mobility, supervision for functional mobility to the bathroom using RW, and Min A for toilet transfer. Pt able to don LSO brace with set up A. OT demonstrated use of LB dressing AE. Pt reports his wife will be available to assist with LB dressing as needed upon D/C (already has a reacher at home). Education provided during session re: log roll technique for bed mobility and no BLT. Pt will benefit from acute OT to increase overall independence in the areas of ADLs and functional mobility in order to safely discharge to next venue of care. OT recommends ongoing therapy upon discharge to maximize safety and independence with ADLs, decrease fall risk, decrease caregiver burden, and promote return to PLOF.     Recommendations for follow up therapy are one component of a multi-disciplinary discharge planning process, led by the attending physician.  Recommendations may be updated based on patient status, additional functional criteria and insurance authorization.   Follow Up Recommendations  Home health OT     Assistance Recommended at Discharge Frequent or constant Supervision/Assistance  Patient can return home with the following A little help with walking and/or transfers;A little help with bathing/dressing/bathroom;Assistance with cooking/housework;Assist for transportation;Help with stairs or ramp for entrance    Functional Status Assessment   Patient has had a recent decline in their functional status and demonstrates the ability to make significant improvements in function in a reasonable and predictable amount of time.  Equipment Recommendations  None recommended by OT    Recommendations for Other Services       Precautions / Restrictions Precautions Precautions: Back;Fall Precaution Booklet Issued: No Restrictions Weight Bearing Restrictions: No      Mobility Bed Mobility Overal bed mobility: Needs Assistance Bed Mobility: Supine to Sit, Sit to Supine           General bed mobility comments: VCs for log roll technique, required bed features/hand rails    Transfers Overall transfer level: Needs assistance Equipment used: Rolling [place] (2 wheels) Transfers: Sit to/from Stand Sit to Stand: Min guard, From elevated surface (from EOB)                  Balance Overall balance assessment: Needs assistance Sitting-balance support: No upper extremity supported, Feet supported Sitting balance-Leahy Scale: Good     Standing balance support: During functional activity, Bilateral upper extremity supported Standing balance-Leahy Scale: Fair                             ADL either performed or assessed with clinical judgement   ADL Overall ADL's : Needs assistance/impaired     Grooming: Min guard;Standing           Upper Body Dressing : Set up;Sitting Upper Body Dressing Details (indicate cue type and reason): to don LSO brace     Toilet Transfer: Regular Toilet;Minimal assistance;Rolling [place] (2 wheels);Grab bars;Ambulation Toilet Transfer Details (indicate cue type and reason): Min A to stand from  lower transfer surface Toileting- Clothing Manipulation and Hygiene: Min guard;Sit to/from stand Toileting - Clothing Manipulation Details (indicate cue type and reason): for clothing management in standing     Functional mobility during ADLs: Supervision/safety;Rolling [place] (2 wheels)  (~15 ft to the bathroom) General ADL Comments: OT provided demonstration for LB dressing AE (already has reacher), however, pt deferring to attempt this date stating "my wife can help me with that."     Vision Baseline Vision/History: 1 Wears glasses Patient Visual Report: No change from baseline       Perception     Praxis      Pertinent Vitals/Pain Pain Assessment Pain Assessment: Faces Faces Pain Scale: Hurts a little bit Pain Location: back incision Pain Descriptors / Indicators: Aching, Discomfort Pain Intervention(s): Limited activity within patient's tolerance, Monitored during session, Repositioned     Hand Dominance     Extremity/Trunk Assessment Upper Extremity Assessment Upper Extremity Assessment: Overall WFL for tasks assessed   Lower Extremity Assessment Lower Extremity Assessment: Generalized weakness       Communication Communication Communication: No difficulties   Cognition Arousal/Alertness: Awake/alert Behavior During Therapy: WFL for tasks assessed/performed Overall Cognitive Status: Within Functional Limits for tasks assessed         General Comments       Exercises Other Exercises Other Exercises: OT provided education re: role of OT, OT POC, post acute recs, sitting up for all meals, EOB/OOB mobility with assistance, home/fall safety, no BLT, log roll technique, how to don/doff LSO, LB dressing AE   Shoulder Instructions      Home Living Family/patient expects to be discharged to:: Private residence Living Arrangements: Spouse/significant other Available Help at Discharge: Family;Available 24 hours/day Type of Home: House Home Access: Ramped entrance     Home Layout: One level     Bathroom Shower/Tub: Occupational psychologist: Handicapped height     Home Equipment: [place] - single Associate Professor (2 wheels);Grab bars - toilet;Grab bars - tub/shower;Adaptive equipment;Shower seat - built in;Other (comment)  (lift chair) Adaptive Equipment: Reacher Additional Comments: pt's wife has macular degeneration      Prior Functioning/Environment Prior Level of Function : Independent/Modified Independent;Driving             Mobility Comments: Mod I (uses RW in the mornings and SPC later in the day) ADLs Comments: Independent, still drives, does yard work        OT Problem List: Decreased strength;Decreased activity tolerance;Impaired balance (sitting and/or standing);Decreased knowledge of use of DME or AE;Decreased knowledge of precautions      OT Treatment/Interventions: Self-care/ADL training;Therapeutic exercise;Neuromuscular education;Energy conservation;DME and/or AE instruction;Manual therapy;Modalities;Balance training;Patient/family education;Visual/perceptual remediation/compensation;Cognitive remediation/compensation;Therapeutic activities;Splinting    OT Goals(Current goals can be found in the care plan section) Acute Rehab OT Goals Patient Stated Goal: return home OT Goal Formulation: With patient/family Time For Goal Achievement: 04/03/22 Potential to Achieve Goals: Good   OT Frequency: Min 2X/week    Co-evaluation              AM-PAC OT "6 Clicks" Daily Activity     Outcome Measure Help from another person eating meals?: None Help from another person taking care of personal grooming?: None Help from another person toileting, which includes using toliet, bedpan, or urinal?: A Little Help from another person bathing (including washing, rinsing, drying)?: A Little Help from another person to put on and taking off regular upper body clothing?: None Help from another person to put on and taking off regular  lower body clothing?: A Little 6 Click Score: 21   End of Session Equipment Utilized During Treatment: Rolling [place] (2 wheels);Back brace Nurse Communication: Mobility status;Precautions  Activity Tolerance: Patient tolerated treatment well Patient left: in  bed;with call bell/phone within reach;with bed alarm set;with family/visitor present  OT Visit Diagnosis: Muscle weakness (generalized) (M62.81);Other abnormalities of gait and mobility (R26.89)                Time: [id number] OT Time Calculation (min): 27 min Charges:  OT General Charges $OT Visit: 1 Visit OT Evaluation $OT Eval Low Complexity: 1 Low  [facility] MS, OTR/L ascom [phone]  03/20/22, 3:14 PM

## 2022-03-21 LAB — GLUCOSE, CAPILLARY
Glucose-Capillary: 130 mg/dL — ABNORMAL HIGH (ref 70–99)
Glucose-Capillary: 108 mg/dL — ABNORMAL HIGH (ref 70–99)
Glucose-Capillary: 98 mg/dL (ref 70–99)
Glucose-Capillary: 150 mg/dL — ABNORMAL HIGH (ref 70–99)

## 2022-03-21 NOTE — Progress Notes (Signed)
Hemovac to lower back with leak, unable to charge.  Neurosurgery PA Cooper Render made aware.

## 2022-03-21 NOTE — Progress Notes (Signed)
OT Cancellation Note  Patient Details Name: Michael Humphrey MRN: [id number] DOB: [birth year]   Cancelled Treatment:    Reason Eval/Treat Not Completed: Pain limiting ability to participate. Attempting to see pt for treatment session. Pt endorsed "having a rough afternoon" 2/2 back pain (10/10) and hemovac leaking. Pt reported RN just gave him pain meds. He deferred all therapeutic intervention at this time. Will re-attempt at later date/time.   Doneta Public 03/21/2022, 4:16 PM

## 2022-03-21 NOTE — Plan of Care (Signed)
  Problem: Activity: Goal: Ability to avoid complications of mobility impairment will improve Outcome: Progressing Goal: Ability to tolerate increased activity will improve Outcome: Progressing Goal: Will remain free from falls Outcome: Progressing   Problem: Pain Management: Goal: Pain level will decrease Outcome: Progressing   Problem: Skin Integrity: Goal: Will show signs of wound healing Outcome: Progressing   Problem: Activity: Goal: Risk for activity intolerance will decrease Outcome: Progressing   Problem: Nutrition: Goal: Adequate nutrition will be maintained Outcome: Progressing   Problem: Safety: Goal: Ability to remain free from injury will improve Outcome: Progressing   Problem: Skin Integrity: Goal: Risk for impaired skin integrity will decrease Outcome: Progressing

## 2022-03-21 NOTE — Progress Notes (Signed)
Progress Note  History: Michael Humphrey is s/p L4-S1 TLIF and PSF  POD2: NAEO POD1: Patient reports significant improvement in his left leg pain from preop.  He has ambulated to the door.  Physical Exam: Vitals:   03/20/22 2312 03/21/22 0845  BP: 136/79 (!) 173/96  Pulse: 77 80  Resp: 20 17  Temp: 98.1 F (36.7 C) 97.6 F (36.4 C)  SpO2: 96% 98%    AA Ox3 CNI  Strength:5/5 throughout BLE  HV output 115 since yesterday  Data:  Other tests/results: none   Assessment/Plan:  Michael Humphrey is a 72 y.o presenting with back pain and left sided sciatica s/p L4-S1 fusion  - mobilize - pain control - DVT prophylaxis -Continue to monitor drain output - PTOT; set up with [facility] preop  Cooper Render PA-C Department of Neurosurgery

## 2022-03-21 NOTE — Progress Notes (Signed)
Physical Therapy Treatment Patient Details Name: Michael Humphrey MRN: [id number] DOB: 1950/09/17 Today's Date: 03/21/2022   History of Present Illness Pt is a 72 y.o. male s s/p L4-S1 fusion 03/19/22.    PT Comments    Pt received upright in recliner in care of NT taking morning vitals. Pt pleasant to PT but reports complaints from RN yesterday afternoon/evening shift. Sounds like pt was attempting log roll to return to bed and RN was not compliant with this technique and may have been a little rough with pt returning to bed. Pt disgruntled with therapeutic listening provided. Educated pt he could file formal complaint if it was that upsetting for him and pt reports he plans to. But otherwise pt without pain at rest and agreeable to participate with PT.  Still requires set up assist to don LSO in sitting with min VC's for sequencing but overall pt with good understanding how to don. Wife present who can assist him if needed at home. Pt requires minA to stand from recliner to RW but overall completing > 200' of gait with consistent step through cadence at supervision level. Does require min VC's for RW proximity but overall improved safety and gait mechanics compared to eval. Reviewed log roll technique with pt understanding. Pt placed in recliner with all needs in reach. LSO remains donned. Remains appropriate with d/c home with [facility] PT.    Recommendations for follow up therapy are one component of a multi-disciplinary discharge planning process, led by the attending physician.  Recommendations may be updated based on patient status, additional functional criteria and insurance authorization.  Follow Up Recommendations  Home health PT     Assistance Recommended at Discharge Frequent or constant Supervision/Assistance  Patient can return home with the following A little help with walking and/or transfers;A little help with bathing/dressing/bathroom;Assistance with cooking/housework;Assist for  transportation;Help with stairs or ramp for entrance   Equipment Recommendations  None recommended by PT    Recommendations for Other Services       Precautions / Restrictions Precautions Precautions: Back;Fall Precaution Booklet Issued: No Restrictions Weight Bearing Restrictions: No     Mobility  Bed Mobility               General bed mobility comments: NT. In recliner pre and post session. Verbally able to comprehend practicing log rolling with NSG Patient Response: Cooperative  Transfers Overall transfer level: Needs assistance Equipment used: Rolling Swatek (2 wheels) Transfers: Sit to/from Stand Sit to Stand: Min assist           General transfer comment: needs minA from recliner. VC's for anterior trunk lean. Has lift chair he uses at baseline so not surprising pt needs physical assist from low surfaces.    Ambulation/Gait Ambulation/Gait assistance: Supervision Gait Distance (Feet): 240 Feet Assistive device: Rolling Bilek (2 wheels) Gait Pattern/deviations: Step-through pattern, Decreased step length - right, Decreased step length - left, Knee flexed in stance - right, Knee flexed in stance - left, Narrow base of support, Trunk flexed       General Gait Details: Improved gait cadence and step through gait this date. Requires very minimal cuing to stay closer to BOS of RW.   Stairs             Wheelchair Mobility    Modified Rankin (Stroke Patients Only)       Balance Overall balance assessment: Needs assistance Sitting-balance support: No upper extremity supported, Feet supported Sitting balance-Leahy Scale: Good  Standing balance-Leahy Scale: Fair Standing balance comment: able to tighten brace in standing without UE's on RW. Needs set up assist.                            Cognition Arousal/Alertness: Awake/alert Behavior During Therapy: WFL for tasks assessed/performed Overall Cognitive Status: Within  Functional Limits for tasks assessed                                          Exercises      General Comments        Pertinent Vitals/Pain Pain Assessment Faces Pain Scale: Hurts a little bit Pain Location: back incision Pain Descriptors / Indicators: Aching, Discomfort Pain Intervention(s): Limited activity within patient's tolerance, Monitored during session, Repositioned    Home Living                          Prior Function            PT Goals (current goals can now be found in the care plan section) Acute Rehab PT Goals Patient Stated Goal: to go home; return to mowing the lawn PT Goal Formulation: With patient/family Time For Goal Achievement: 04/03/22 Potential to Achieve Goals: Good Progress towards PT goals: Progressing toward goals    Frequency    7X/week      PT Plan Current plan remains appropriate    Co-evaluation              AM-PAC PT "6 Clicks" Mobility   Outcome Measure  Help needed turning from your back to your side while in a flat bed without using bedrails?: A Little Help needed moving from lying on your back to sitting on the side of a flat bed without using bedrails?: A Little Help needed moving to and from a bed to a chair (including a wheelchair)?: A Little Help needed standing up from a chair using your arms (e.g., wheelchair or bedside chair)?: A Lot Help needed to walk in hospital room?: A Little Help needed climbing 3-5 steps with a railing? : A Lot 6 Click Score: 16    End of Session Equipment Utilized During Treatment: Gait belt;Back brace Activity Tolerance: Patient tolerated treatment well Patient left: in chair;with call bell/phone within reach;with chair alarm set;with family/visitor present Nurse Communication: Mobility status PT Visit Diagnosis: Unsteadiness on feet (R26.81);Muscle weakness (generalized) (M62.81);Difficulty in walking, not elsewhere classified (R26.2)     Time:  [id number] PT Time Calculation (min) (ACUTE ONLY): 24 min  Charges:  $Gait Training: 23-37 mins                    Salem Caster. Fairly IV, PT, DPT Physical Therapist- [facility]  03/21/2022, 9:36 AM

## 2022-03-22 LAB — GLUCOSE, CAPILLARY: Glucose-Capillary: 160 mg/dL — ABNORMAL HIGH (ref 70–99)

## 2022-03-22 MED ORDER — SENNA 8.6 MG PO TABS: 1.0000 | ORAL_TABLET | Freq: Every day | ORAL | 0 refills | Status: DC | PRN

## 2022-03-22 MED ORDER — METHOCARBAMOL 750 MG PO TABS: 750.0000 mg | ORAL_TABLET | Freq: Three times a day (TID) | ORAL | 0 refills | Status: DC

## 2022-03-22 MED ORDER — OXYCODONE HCL 5 MG PO TABS: 5.0000 mg | ORAL_TABLET | ORAL | 0 refills | Status: DC | PRN

## 2022-03-22 NOTE — Care Management Important Message (Signed)
Important Message  Patient Details  Name: Michael Humphrey MRN: [id number] Date of Birth: 03/22/1950   Medicare Important Message Given:  Yes     Dannette Barbara 03/22/2022, 12:36 PM

## 2022-03-22 NOTE — Plan of Care (Signed)
  Problem: Education: Goal: Knowledge of General Education information will improve Description Including pain rating scale, medication(s)/side effects and non-pharmacologic comfort measures Outcome: Progressing   Problem: Health Behavior/Discharge Planning: Goal: Ability to manage health-related needs will improve Outcome: Progressing   Problem: Clinical Measurements: Goal: Ability to maintain clinical measurements within normal limits will improve Outcome: Progressing Goal: Will remain free from infection Outcome: Progressing Goal: Diagnostic test results will improve Outcome: Progressing Goal: Cardiovascular complication will be avoided Outcome: Progressing   Problem: Activity: Goal: Risk for activity intolerance will decrease Outcome: Progressing   Problem: Nutrition: Goal: Adequate nutrition will be maintained Outcome: Progressing   Problem: Coping: Goal: Level of anxiety will decrease Outcome: Progressing   Problem: Elimination: Goal: Will not experience complications related to bowel motility Outcome: Progressing Goal: Will not experience complications related to urinary retention Outcome: Progressing   Problem: Pain Managment: Goal: General experience of comfort will improve Outcome: Progressing   

## 2022-03-22 NOTE — TOC Transition Note (Signed)
Transition of Care [facility]) - CM/SW Discharge Note   Patient Details  Name: Michael Humphrey MRN: [id number] Date of Birth: 04-21-1950  [facility]) CM/SW Contact:  Ross Ludwig, LCSW Phone Number: 03/22/2022, 9:43 AM   Clinical Narrative:     Patient will be going home with home health through [place]. TOC signing off please reconsult with any other TOC needs, home health agency has been notified of planned discharge.   Final next level of care: Home w Home Health Services Barriers to Discharge: Barriers Resolved   Patient Goals and CMS Choice CMS Medicare.gov Compare Post Acute Care list provided to:: Patient Choice offered to / list presented to : Patient  Discharge Placement                         Discharge Plan and Services Additional resources added to the After Visit Summary for     Discharge Planning Services: CM Consult            DME Arranged: N/A DME Agency: NA       HH Arranged: PT HH Agency: [place] Date [place]: 03/22/22 Time [place]: [phone] Representative spoke with at [place]: [place] Determinants of Health ([place]) Interventions SDOH Screenings   Food Insecurity: No Food Insecurity (03/20/2022)  Housing: Low Risk  (03/20/2022)  Transportation Needs: No Transportation Needs (03/20/2022)  Utilities: Not At Risk (03/20/2022)  Tobacco Use: Medium Risk (03/20/2022)     Readmission Risk Interventions     No data to display

## 2022-03-22 NOTE — Progress Notes (Signed)
Progress Note  History: Michael Humphrey is s/p L4-S1 TLIF and PSF  POD3: some discomfort overnight  POD2: NAEO POD1: Patient reports significant improvement in his left leg pain from preop.  He has ambulated to the door.  Physical Exam: Vitals:   03/21/22 1543 03/21/22 2321  BP: (!) 136/97 (!) 165/99  Pulse: 91 91  Resp: 17 20  Temp: 98.3 F (36.8 C) 97.9 F (36.6 C)  SpO2: 98% 96%    AA Ox3 CNI  Strength:5/5 throughout BLE   Data:  Other tests/results: none   Assessment/Plan:  Michael Humphrey is a 72 y.o presenting with back pain and left sided sciatica s/p L4-S1 fusion  - mobilize - pain control - DVT prophylaxis -Continue to monitor drain output - PTOT; set up with [facility] preop  Cooper Render PA-C Department of Neurosurgery

## 2022-03-22 NOTE — Discharge Instructions (Addendum)
NEUROSURGERY DISCHARGE INSTRUCTIONS  Admission diagnosis: S/P lumbar fusion [Z98.1]  Operative procedure: L4-S1 TLIF and PSF   What to do after you leave the hospital:  Recommended diet: regular diet. Increase protein intake to promote wound healing.  Recommended activity: no lifting, driving, or strenuous exercise for 4 weeks . You should walk multiple times per day  Special Instructions  No straining, no heavy lifting > 10lbs x 4 weeks.  Keep incision area clean and dry. May shower in 2 days. No baths or pools for 6 weeks.  Please remove dressing tomorrow, no need to apply a bandage afterwards  You have no sutures to remove, the skin is closed with adhesive  Please take pain medications as directed. Take a stool softener if on pain medications   Please Report any of the following: Nausea or Vomiting, Temperature is greater than 101.37F (38.1C) degrees, Dizziness, Abdominal Pain, Difficulty Breathing or Shortness of Breath, Inability to Eat, drink Fluids, or Take medications, Bleeding, swelling, or drainage from surgical incision sites, New numbness or weakness, and Bowel or bladder dysfunction to the neurosurgeon on call at [phone]  Additional Follow up appointments Please follow up with Cooper Render PA-C in [place] clinic as scheduled in 2-3 weeks   Please see below for scheduled appointments:  Future Appointments  Date Time Provider [place]  04/03/2022  1:30 PM Loleta Dicker, PA [facility] None  05/01/2022  9:30 AM Meade Maw, MD [facility] None  06/07/2022 10:30 AM Loleta Dicker, PA [facility] None

## 2022-03-22 NOTE — Discharge Summary (Signed)
Discharge Summary  Patient ID: Michael Humphrey MRN: [id number] DOB/AGE: 01-26-1950 72 y.o.  Admit date: 03/19/2022 Discharge date: 03/22/2022  Admission Diagnoses: M54.42, G89.29 Chronic bilateral low back pain with left-sided sciatica   Discharge Diagnoses:  Principal Problem:   S/P lumbar fusion Active Problems:   Chronic bilateral low back pain with left-sided sciatica   Discharged Condition: good  Hospital Course:  GERVASE PETRICCA is a 72 year old status post L4-5 and L5-S1 TLIF and posterior spinal fusion.  His intraoperative course was uncomplicated and he was admitted for pain control, therapy evaluation, and drain output monitoring.  His drain output was high on postop day 1 and 2 but decreased and was able to be removed on postop day 3.  He reported significant improvement of his preoperative left leg pain with expected low back pain.  He was seen and evaluated by therapy and deemed appropriate for discharge home with home health services.  He was discharged home with medications for pain, muscle relaxer, and stool softener on postop day 3.  Consults: None  Significant Diagnostic Studies: none  Treatments: surgery: as above.  Please see separately dictated operative report for further details  Discharge Exam: Blood pressure (!) 144/89, pulse 95, temperature 98.2 F (36.8 C), resp. rate 17, height 5\' 9"  (1.753 m), weight 91.2 kg, SpO2 97 %.  AA Ox3 CNI   Strength:5/5 throughout BLE  Incision covered with post-op bandage  Disposition: Discharge disposition: 01-Home or Self Care       Discharge Instructions     Incentive spirometry RT   Complete by: As directed       Allergies as of 03/22/2022       Reactions   Clonidine Derivatives    Hallucinations   Fioricet [butalbital-apap-caffeine]    hallucination   Hydrocodone-acetaminophen    hallucinations   Mobic [meloxicam]    hallucinations   Norvasc [amlodipine Besylate]    hallucinations    Statins    Muscle cramps   Tramadol Itching        Medication List     TAKE these medications    acetaminophen 500 MG tablet Commonly known as: TYLENOL Take 1,000 mg by mouth every 6 (six) hours as needed.   aspirin EC 81 MG tablet Take 81 mg by mouth daily. Swallow whole.   carvedilol 12.5 MG tablet Commonly known as: COREG Take 12.5 mg by mouth 2 (two) times daily.   finasteride 5 MG tablet Commonly known as: PROSCAR Take 5 mg by mouth every morning.   gabapentin 600 MG tablet Commonly known as: NEURONTIN Take 600 mg by mouth 2 (two) times daily.   glimepiride 2 MG tablet Commonly known as: AMARYL Take 1 mg by mouth daily with breakfast.   lisinopril 2.5 MG tablet Commonly known as: ZESTRIL Take 1 tablet (2.5 mg total) by mouth daily. What changed: when to take this   metFORMIN 500 MG tablet Commonly known as: GLUCOPHAGE Take 500 mg by mouth 2 (two) times daily.   methocarbamol 750 MG tablet Commonly known as: ROBAXIN Take 1 tablet (750 mg total) by mouth 3 (three) times daily.   oxyCODONE 5 MG immediate release tablet Commonly known as: Oxy IR/ROXICODONE Take 1 tablet (5 mg total) by mouth every 3 (three) hours as needed for moderate pain ((score 4 to 6)).   rosuvastatin 5 MG tablet Commonly known as: CRESTOR Take 5 mg by mouth daily.   senna 8.6 MG Tabs tablet Commonly known as: SENOKOT Take 1 tablet (8.6 mg  total) by mouth daily as needed for mild constipation.   torsemide 20 MG tablet Commonly known as: DEMADEX Take 20 mg by mouth daily as needed (swelling).        Follow-up Information     Loleta Dicker, [place]. Go on 04/03/2022.   Specialty: Neurosurgery Why: Appt @ 1:30 pm Contact information: [address] [place] [address] [phone]                 Signed: Loleta Dicker 03/22/2022, 9:41 AM

## 2022-03-23 ENCOUNTER — Telehealth: Payer: Self-pay | Admitting: Neurosurgery

## 2022-03-23 DIAGNOSIS — I119 Hypertensive heart disease without heart failure: Secondary | ICD-10-CM | POA: Diagnosis not present

## 2022-03-23 DIAGNOSIS — Z89422 Acquired absence of other left toe(s): Secondary | ICD-10-CM | POA: Diagnosis not present

## 2022-03-23 DIAGNOSIS — Z86711 Personal history of pulmonary embolism: Secondary | ICD-10-CM | POA: Diagnosis not present

## 2022-03-23 DIAGNOSIS — Z4789 Encounter for other orthopedic aftercare: Secondary | ICD-10-CM | POA: Diagnosis not present

## 2022-03-23 DIAGNOSIS — E11319 Type 2 diabetes mellitus with unspecified diabetic retinopathy without macular edema: Secondary | ICD-10-CM | POA: Diagnosis not present

## 2022-03-23 DIAGNOSIS — I6529 Occlusion and stenosis of unspecified carotid artery: Secondary | ICD-10-CM | POA: Diagnosis not present

## 2022-03-23 DIAGNOSIS — Z981 Arthrodesis status: Secondary | ICD-10-CM | POA: Diagnosis not present

## 2022-03-23 DIAGNOSIS — I2510 Atherosclerotic heart disease of native coronary artery without angina pectoris: Secondary | ICD-10-CM | POA: Diagnosis not present

## 2022-03-23 DIAGNOSIS — G4730 Sleep apnea, unspecified: Secondary | ICD-10-CM | POA: Diagnosis not present

## 2022-03-23 DIAGNOSIS — I454 Nonspecific intraventricular block: Secondary | ICD-10-CM | POA: Diagnosis not present

## 2022-03-23 NOTE — Telephone Encounter (Signed)
1st visit with PT today  80/50 BP when PT first arrived [place] she had to assist him the restroom 118/60 BP after going to the restroom  Having trouble using the restroom (voiding urine); PT advised that they notify her if he goes more than 6 hours without going  She also wanted to note his pain levels: No movement 0/10 pain scale Any movement 9-10/10 pain scale  Just taking oxycodone

## 2022-03-23 NOTE — Telephone Encounter (Signed)
After discussion with Michael Humphrey, I spoke with Michael Humphrey and Michael Humphrey and advised them to use primarily tylenol and robaxin, and that Michael Humphrey would like them to minimize oxycodone. I also advised that if Michael Humphrey starts having more pain due to the fall, to let us know and we will get an xray.  Michael Humphrey reports that Michael Humphrey did not fall, Michael Humphrey slid out of the chair and on to the floor. Michael Humphrey states Michael Humphrey is not having a lot of pain, only in Michael Humphrey back and mainly when Michael Humphrey walks.  Michael Humphrey Michael Humphrey states that Michael Humphrey has voided 3 times since our last conversation and Michael Humphrey has had 4 bowel movements today. She has rechecked Michael Humphrey blood pressure and it was 118/70 again. Michael Humphrey reiterated that Michael Humphrey is doing much better than this morning.

## 2022-03-23 NOTE — Telephone Encounter (Signed)
I spoke with Michael Humphrey. He has had a bowel movement and urinated since that message was left. He was actually in the bathroom when I called. He is drinking water. He states his blood pressure "came back up" before the physical therapist left. His wife reports it was 118/70 when the physical therapist left. He took oxycodone and tylenol. They report he is doing much better than this morning. He denies headache, dizziness, shortness of breath. He states he is very pleased and appreciative of Dr Izora Ribas.  I encouraged him to increase his fluids and to contact us with any issues/concerns. They confirmed that they have the discharge instructions and know how to contact us after hours if needed.

## 2022-03-23 NOTE — Telephone Encounter (Signed)
She forgot to mention that the patient reported a fall. More that he slide out of his chair. They had to call EMS to help get him out of the floor. He is reporting no injuries. [phone]

## 2022-03-28 ENCOUNTER — Telehealth: Payer: Self-pay | Admitting: Neurosurgery

## 2022-03-28 NOTE — Telephone Encounter (Signed)
L4-S1 TLIF on 03/19/22  [facility] with Latricia Heft-- Patient is having stomach discomfort. He has not had a good bowel movement since Saturday. He is not urinating normally. He when urinates it is very little, not a normal stream. What should he do?  [phone]

## 2022-03-28 NOTE — Telephone Encounter (Signed)
He has a history of BPH. Similar call on 3/22 regarding urinary issues. At that time, we discussed minimizing narcotics and increasing fluids.  Should I advise him to contact Dr Ouida Sills for the urinary issues?  When I spoke with him on 3/22, he had 4 bowel movements that day and confirmed he was taking a stool softener. I advised him to continue stool softeners while using pain medication. Should I advise them to pick up magnesium citrate for the constipation (as long as he is passing gas, abdomen isn't rigid, etc)?

## 2022-03-28 NOTE — Telephone Encounter (Signed)
Michael Humphrey calling back She wants to add that patient states that when he has the urge to urinate he only dribbles.

## 2022-03-28 NOTE — Telephone Encounter (Signed)
Left detailed message on pt's personal voicemail (confirmed by name) that we recommend he discuss urinary issues with Dr Ouida Sills, minimize narcotics, and that he pick up some magnesium citrate to get his bowels moving, which should help with urination

## 2022-03-29 NOTE — Telephone Encounter (Signed)
Michael Humphrey called back today, reiterated Michael Humphrey's message.  Michael Humphrey stated he had had a normal BM without stool softener today after eating a "big hamburger"  Still having trouble with urination.  Advised to follow up with his PCP Ouida Sills) and try to magnesium citrate if he has anymore issues.

## 2022-03-30 DIAGNOSIS — R531 Weakness: Secondary | ICD-10-CM | POA: Diagnosis not present

## 2022-03-30 DIAGNOSIS — Z20822 Contact with and (suspected) exposure to covid-19: Secondary | ICD-10-CM | POA: Diagnosis not present

## 2022-03-30 DIAGNOSIS — G8929 Other chronic pain: Secondary | ICD-10-CM | POA: Diagnosis not present

## 2022-03-30 DIAGNOSIS — I771 Stricture of artery: Secondary | ICD-10-CM | POA: Diagnosis not present

## 2022-03-30 DIAGNOSIS — E11649 Type 2 diabetes mellitus with hypoglycemia without coma: Secondary | ICD-10-CM | POA: Diagnosis not present

## 2022-03-30 DIAGNOSIS — R339 Retention of urine, unspecified: Secondary | ICD-10-CM | POA: Diagnosis not present

## 2022-03-30 DIAGNOSIS — N139 Obstructive and reflux uropathy, unspecified: Secondary | ICD-10-CM | POA: Diagnosis not present

## 2022-03-30 DIAGNOSIS — E119 Type 2 diabetes mellitus without complications: Secondary | ICD-10-CM | POA: Diagnosis not present

## 2022-03-30 DIAGNOSIS — E160 Drug-induced hypoglycemia without coma: Secondary | ICD-10-CM | POA: Diagnosis not present

## 2022-03-30 DIAGNOSIS — R600 Localized edema: Secondary | ICD-10-CM | POA: Diagnosis not present

## 2022-03-30 DIAGNOSIS — Z981 Arthrodesis status: Secondary | ICD-10-CM | POA: Diagnosis not present

## 2022-03-30 DIAGNOSIS — N179 Acute kidney failure, unspecified: Secondary | ICD-10-CM | POA: Diagnosis not present

## 2022-03-30 DIAGNOSIS — E785 Hyperlipidemia, unspecified: Secondary | ICD-10-CM | POA: Diagnosis not present

## 2022-03-30 DIAGNOSIS — M4326 Fusion of spine, lumbar region: Secondary | ICD-10-CM | POA: Diagnosis not present

## 2022-03-30 DIAGNOSIS — G8918 Other acute postprocedural pain: Secondary | ICD-10-CM | POA: Diagnosis not present

## 2022-03-30 DIAGNOSIS — R188 Other ascites: Secondary | ICD-10-CM | POA: Diagnosis not present

## 2022-03-30 DIAGNOSIS — Z7984 Long term (current) use of oral hypoglycemic drugs: Secondary | ICD-10-CM | POA: Diagnosis not present

## 2022-03-30 DIAGNOSIS — N401 Enlarged prostate with lower urinary tract symptoms: Secondary | ICD-10-CM | POA: Diagnosis not present

## 2022-03-30 DIAGNOSIS — R0902 Hypoxemia: Secondary | ICD-10-CM | POA: Diagnosis not present

## 2022-03-30 DIAGNOSIS — N138 Other obstructive and reflux uropathy: Secondary | ICD-10-CM | POA: Diagnosis not present

## 2022-03-30 DIAGNOSIS — R10819 Abdominal tenderness, unspecified site: Secondary | ICD-10-CM | POA: Diagnosis not present

## 2022-03-30 DIAGNOSIS — I10 Essential (primary) hypertension: Secondary | ICD-10-CM | POA: Diagnosis not present

## 2022-03-30 DIAGNOSIS — T383X1A Poisoning by insulin and oral hypoglycemic [antidiabetic] drugs, accidental (unintentional), initial encounter: Secondary | ICD-10-CM | POA: Diagnosis not present

## 2022-03-30 DIAGNOSIS — Z791 Long term (current) use of non-steroidal anti-inflammatories (NSAID): Secondary | ICD-10-CM | POA: Diagnosis not present

## 2022-03-30 DIAGNOSIS — I2510 Atherosclerotic heart disease of native coronary artery without angina pectoris: Secondary | ICD-10-CM | POA: Diagnosis not present

## 2022-03-30 DIAGNOSIS — M47816 Spondylosis without myelopathy or radiculopathy, lumbar region: Secondary | ICD-10-CM | POA: Diagnosis not present

## 2022-03-30 DIAGNOSIS — E162 Hypoglycemia, unspecified: Secondary | ICD-10-CM | POA: Diagnosis not present

## 2022-03-30 DIAGNOSIS — T383X5A Adverse effect of insulin and oral hypoglycemic [antidiabetic] drugs, initial encounter: Secondary | ICD-10-CM | POA: Diagnosis not present

## 2022-03-30 DIAGNOSIS — M5136 Other intervertebral disc degeneration, lumbar region: Secondary | ICD-10-CM | POA: Diagnosis not present

## 2022-03-30 DIAGNOSIS — R338 Other retention of urine: Secondary | ICD-10-CM | POA: Diagnosis not present

## 2022-03-30 DIAGNOSIS — K5900 Constipation, unspecified: Secondary | ICD-10-CM | POA: Diagnosis not present

## 2022-04-03 ENCOUNTER — Encounter: Payer: PPO | Admitting: Neurosurgery

## 2022-04-03 ENCOUNTER — Telehealth: Payer: Self-pay | Admitting: Neurosurgery

## 2022-04-03 ENCOUNTER — Inpatient Hospital Stay
Admission: RE | Admit: 2022-04-03 | Discharge: 2022-04-03 | Disposition: A | Discharge status: Home / Self Care | Payer: Self-pay | Source: Ambulatory Visit | Attending: Neurosurgery | Admitting: Neurosurgery

## 2022-04-03 ENCOUNTER — Other Ambulatory Visit: Payer: Self-pay

## 2022-04-03 DIAGNOSIS — Z049 Encounter for examination and observation for unspecified reason: Secondary | ICD-10-CM

## 2022-04-03 NOTE — Telephone Encounter (Signed)
Michael Humphrey left voice message on 04/02/22 at 8am That her husband was inpatient and will not make it to his appt on 04/03/2022  I called Michael Humphrey this morning, patient is at [facility] since 03/30/22 due to urine and bowel issues. He is doing a lot better and should he discharged home today. She will call back to reschedule his postop.

## 2022-04-03 NOTE — Telephone Encounter (Signed)
[facility] are loaded to [facility]

## 2022-04-04 ENCOUNTER — Telehealth: Payer: Self-pay | Admitting: Neurosurgery

## 2022-04-04 DIAGNOSIS — E11319 Type 2 diabetes mellitus with unspecified diabetic retinopathy without macular edema: Secondary | ICD-10-CM | POA: Diagnosis not present

## 2022-04-04 DIAGNOSIS — I119 Hypertensive heart disease without heart failure: Secondary | ICD-10-CM | POA: Diagnosis not present

## 2022-04-04 DIAGNOSIS — Z86711 Personal history of pulmonary embolism: Secondary | ICD-10-CM | POA: Diagnosis not present

## 2022-04-04 DIAGNOSIS — Z89422 Acquired absence of other left toe(s): Secondary | ICD-10-CM | POA: Diagnosis not present

## 2022-04-04 DIAGNOSIS — G4730 Sleep apnea, unspecified: Secondary | ICD-10-CM | POA: Diagnosis not present

## 2022-04-04 DIAGNOSIS — I2510 Atherosclerotic heart disease of native coronary artery without angina pectoris: Secondary | ICD-10-CM | POA: Diagnosis not present

## 2022-04-04 DIAGNOSIS — I6529 Occlusion and stenosis of unspecified carotid artery: Secondary | ICD-10-CM | POA: Diagnosis not present

## 2022-04-04 DIAGNOSIS — Z4789 Encounter for other orthopedic aftercare: Secondary | ICD-10-CM | POA: Diagnosis not present

## 2022-04-04 DIAGNOSIS — I454 Nonspecific intraventricular block: Secondary | ICD-10-CM | POA: Diagnosis not present

## 2022-04-04 DIAGNOSIS — Z981 Arthrodesis status: Secondary | ICD-10-CM | POA: Diagnosis not present

## 2022-04-04 NOTE — Telephone Encounter (Signed)
Spoke with Raquel Sarna and gave her the verbal order.

## 2022-04-04 NOTE — Telephone Encounter (Signed)
Michael Humphrey from [place] the office that patient was discharged from [facility] with a catheter. She would like verbal order to pick patient back up for PT  2 x 3

## 2022-04-04 NOTE — Progress Notes (Unsigned)
   REFERRING PHYSICIAN:  No referring provider defined for this encounter.  DOS: 03/19/22 L4-5 and L5-S1 TLIF and posterior spinal fusion   HISTORY OF PRESENT ILLNESS: Michael Humphrey is approximately 2.5 weeks status post lumbar fusion. he is doing well. ***  PHYSICAL EXAMINATION:  General: Patient is well developed, well nourished, calm, collected, and in no apparent distress.   NEUROLOGICAL:  General: In no acute distress.   Awake, alert, oriented to person, place, and time.  Pupils equal round and reactive to light.  Facial tone is symmetric.  Tongue protrusion is midline.  There is no pronator drift.   Strength:            Side Iliopsoas Quads Hamstring PF DF EHL  R 5 5 5 5 5 5   L 5 5 5 5 5 5    Incision c/d/i   ROS (Neurologic):  Negative except as noted above  IMAGING: No interval imaging to review  ASSESSMENT/PLAN:  Michael Humphrey is doing *** approximately 2.5 weeks after lumbar fusion.  We discussed activity escalation and I have advised the patient to lift up to 10 pounds until 6 weeks after surgery, then increase up to 25 pounds until 12 weeks after surgery.  After 12 weeks post-op, the patient advised to increase activity as tolerated.  he will follow up with Dr. Izora Ribas in 4 weeks with lumbar xrays prior Advised to contact the office if any questions or concerns arise.  Cooper Render PA-C Department of neurosurgery

## 2022-04-05 ENCOUNTER — Encounter: Payer: Self-pay | Admitting: Neurosurgery

## 2022-04-05 ENCOUNTER — Ambulatory Visit (INDEPENDENT_AMBULATORY_CARE_PROVIDER_SITE_OTHER): Payer: PPO | Admitting: Neurosurgery

## 2022-04-05 VITALS — BP 122/78 | HR 78 | Ht 69.0 in | Wt 201.0 lb

## 2022-04-05 DIAGNOSIS — Z09 Encounter for follow-up examination after completed treatment for conditions other than malignant neoplasm: Secondary | ICD-10-CM

## 2022-04-05 DIAGNOSIS — Z981 Arthrodesis status: Secondary | ICD-10-CM

## 2022-04-05 DIAGNOSIS — M5442 Lumbago with sciatica, left side: Secondary | ICD-10-CM

## 2022-04-05 DIAGNOSIS — G8929 Other chronic pain: Secondary | ICD-10-CM

## 2022-04-06 NOTE — Telephone Encounter (Signed)
Danielle saw him 04/05/22

## 2022-04-09 DIAGNOSIS — Z89422 Acquired absence of other left toe(s): Secondary | ICD-10-CM | POA: Diagnosis not present

## 2022-04-09 DIAGNOSIS — I129 Hypertensive chronic kidney disease with stage 1 through stage 4 chronic kidney disease, or unspecified chronic kidney disease: Secondary | ICD-10-CM | POA: Diagnosis not present

## 2022-04-09 DIAGNOSIS — M48062 Spinal stenosis, lumbar region with neurogenic claudication: Secondary | ICD-10-CM | POA: Diagnosis not present

## 2022-04-09 DIAGNOSIS — E1122 Type 2 diabetes mellitus with diabetic chronic kidney disease: Secondary | ICD-10-CM | POA: Diagnosis not present

## 2022-04-09 DIAGNOSIS — N183 Chronic kidney disease, stage 3 unspecified: Secondary | ICD-10-CM | POA: Diagnosis not present

## 2022-04-10 ENCOUNTER — Ambulatory Visit: Payer: PPO | Admitting: Urology

## 2022-04-10 ENCOUNTER — Ambulatory Visit (INDEPENDENT_AMBULATORY_CARE_PROVIDER_SITE_OTHER): Payer: PPO | Admitting: Physician Assistant

## 2022-04-10 VITALS — BP 148/87 | HR 85 | Ht 69.0 in | Wt 202.2 lb

## 2022-04-10 DIAGNOSIS — Z87898 Personal history of other specified conditions: Secondary | ICD-10-CM

## 2022-04-10 DIAGNOSIS — R310 Gross hematuria: Secondary | ICD-10-CM | POA: Diagnosis not present

## 2022-04-10 DIAGNOSIS — R339 Retention of urine, unspecified: Secondary | ICD-10-CM

## 2022-04-10 DIAGNOSIS — N179 Acute kidney failure, unspecified: Secondary | ICD-10-CM | POA: Diagnosis not present

## 2022-04-10 NOTE — Progress Notes (Signed)
I, Amy L Pierron,acting as a scribe for Vanna Scotland, MD.,have documented all relevant documentation on the behalf of Vanna Scotland, MD,as directed by  Vanna Scotland, MD while in the presence of Vanna Scotland, MD.  04/10/2022 9:20 AM   Michael Humphrey 1950-06-15 [id number]  Referring provider: Lauro Regulus, MD [address] [facility] [place] [facility] [place],  [place] [address]  Chief Complaint  Patient presents with   Establish Care   Urinary Retention    HPI: 72 year-old male who presents today for further evaluation of urinary retention and a voiding trial.  He has a personal history of chronic low back pain and sciatica. He underwent lumbar fusion on 03/22/2022. His post-operative course was complicated by urinary retention. As a result he ended up being admitted to [facility] with renal failure, thought to be secondary to massive retention. Creatinine was greater than four. A foley catheter was placed during the admission; he failed a voiding trial. Catheters were then replaced. He was started on Flomax and has already gone chronic Finasteride which was started in 2020. His creatinine trended back down to normal levels prior to discharge.  Most recent PSA was 0.98 on 06/19/2021.   He was seen during an inpatient consult in 2020 for gross hematuria after starting anti-coagulants. He had a CT urogram at that time and was unremarkable. He never underwent follow-up cystoscopy. He's had several urinalysis since then that show no evidence of microscopic blood.  He is accompanied by his wife. They report that prior to his spine surgery he did not have any urinary symptoms.  He has experienced constipation and immobility.   PMH: Past Medical History:  Diagnosis Date   Anginal pain    Arthritis    BPH (benign prostatic hyperplasia)    CAD (coronary artery disease)    a.) LHC 08/22/2012: normal coronaries; b.) LHC 06/08/2021: EF 55-65%. 30% oLM, 100% p-mLAD, 50% o-pLAD,  60% pLAD, 30% pRCA, 75% RPDA, 75% RPAV -- med mgmt.   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   DDD (degenerative disc disease), lumbar    Diabetic retinopathy    DISH (diffuse idiopathic skeletal hyperostosis)    Diverticulitis    Full dentures    GERD (gastroesophageal reflux disease)    Heart attack    High cholesterol    Hordeolum internum right lower eyelid 12/09/2019   Hypertension    Incomplete right bundle branch block (RBBB)    Lumbar radiculitis    a.) s/p L4-L5 decompression   Lumbar radiculopathy    Posterior capsular opacification, 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 Janee Morn recently and sent here   Pulmonary embolism    Skin cancer    Sleep apnea    a.) unable to tolerate nocturnal PAP therapy   Spinal stenosis of lumbar region, unspecified whether neurogenic claudication present    Statin intolerance    T2DM (type 2 diabetes mellitus)    Tubular adenoma of colon     Surgical History: Past Surgical History:  Procedure Laterality Date   AMPUTATION TOE Left 01/15/2020   Procedure: AMPUTATION TOE MPJ T1;  Surgeon: Gwyneth Revels, DPM;  Location: [facility] OR[facility];  Service: Podiatry;  Laterality: Left;   APPLICATION OF INTRAOPERATIVE CT SCAN N/A 03/19/2022   Procedure: APPLICATION OF INTRAOPERATIVE CT SCAN;  Surgeon: Venetia NightYarbrough, Chester, MD;  Location: [facility];  Service: Neurosurgery;  Laterality: N/A;   BUNIONECTOMY     CATARACT EXTRACTION W/PHACO Left 12/11/2017   Procedure: CATARACT EXTRACTION PHACO AND INTRAOCULAR LENS PLACEMENT (IOC)  LEFT DIABETIC;  Surgeon: Lockie MolaBrasington, Chadwick, MD;  [facility];  Service: Ophthalmology;  Laterality: Left;  DIABETIC ([facility] picking pt up at 0600, needs 0630 arrival time)   CATARACT EXTRACTION W/PHACO Right 01/15/2018   Procedure: CATARACT EXTRACTION PHACO AND INTRAOCULAR LENS PLACEMENT (IOC)  RIGHT DIABETIC  leave so arrival will be around 7:45 ds;  Surgeon: Lockie MolaBrasington, Chadwick, MD;  [facility];  Service: Ophthalmology;  Laterality: Right;  Diabetic - oral meds   CHOLECYSTECTOMY     COLONOSCOPY     COLONOSCOPY N/A 08/22/2021   Procedure: COLONOSCOPY;  Surgeon: Midge MiniumWohl, Darren, MD;  [facility];  Service: Endoscopy;  Laterality: N/A;   COLONOSCOPY WITH PROPOFOL N/A 12/17/2014   Procedure: COLONOSCOPY WITH PROPOFOL;  Surgeon: Christena DeemMartin U Skulskie, MD;  [facility];  Service: Endoscopy;  Laterality: N/A;   correct hammertoe     FASCIECTOMY Right 10/29/2019   Procedure: PLANTAR FIBROMA RESECTION RIGHT;  Surgeon: Rosetta PosnerBaker, Andrew, DPM;  [facility];  Service: Podiatry;  Laterality: Right;  Diabetic - oral meds   JOINT REPLACEMENT     KNEE ARTHROSCOPY Right    LEFT HEART CATH AND CORONARY ANGIOGRAPHY N/A 06/08/2021   Procedure: LEFT HEART CATH AND CORONARY ANGIOGRAPHY;  Surgeon: Marcina MillardParaschos, Alexander, MD;  Location: [facility];  Service: Cardiovascular;  Laterality: N/A;   LEFT HEART CATH AND CORONARY ANGIOGRAPHY Left 08/22/2012   Procedure: LEFT HEART CATH AND CORONARY ANGIOGRAPHY; Location: [facility]; Surgeon: Harold HedgeKenneth Fath, MD   LUMBAR LAMINECTOMY/ DECOMPRESSION WITH MET-RX N/A 02/18/2019   Procedure: L4-5 DECOMPRESSION;  Surgeon: Venetia NightYarbrough, Chester, MD;  Location: [facility];  Service: Neurosurgery;  Laterality: N/A;   SHOULDER ARTHROSCOPY WITH OPEN ROTATOR CUFF REPAIR AND DISTAL CLAVICLE ACROMINECTOMY Right 11/16/2021   Procedure: SHOULDER ARTHROSCOPY WITH OPEN ROTATOR CUFF REPAIR AND DISTAL CLAVICLE ACROMINECTOMY;  Surgeon: Juanell FairlyKrasinski, Kevin, MD;  Location: [facility];  Service: Orthopedics;   Laterality: Right;   TOTAL HIP ARTHROPLASTY Bilateral    TOTAL KNEE ARTHROPLASTY Bilateral    TRANSFORAMINAL LUMBAR INTERBODY FUSION W/ MIS 2 LEVEL N/A 03/19/2022   Procedure: L4-S1 MINIMALLY INVASIVE (MIS) TRANSFORAMINAL LUMBAR INTERBODY FUSION (TLIF);  Surgeon: Venetia NightYarbrough, Chester, MD;  Location: [facility];  Service: Neurosurgery;  Laterality: N/A;    Home Medications:  Allergies as of 04/10/2022       Reactions   Clonidine Derivatives    Hallucinations   Fioricet [butalbital-apap-caffeine]    hallucination   Hydrocodone-acetaminophen    hallucinations   Mobic [meloxicam]    hallucinations   Norvasc [amlodipine Besylate]    hallucinations   Statins    Muscle cramps   Tramadol Itching        Medication List        Accurate as of April 10, 2022  9:20 AM. If you have any questions, ask your nurse or doctor.          STOP taking these medications    glimepiride 2 MG tablet Commonly known as: AMARYL Stopped by: Vanna ScotlandAshley Kodey Xue, MD   torsemide 20 MG tablet Commonly known as: DEMADEX Stopped by: Vanna ScotlandAshley Tanikka Bresnan, MD  TAKE these medications    acetaminophen 500 MG tablet Commonly known as: TYLENOL Take 1,000 mg by mouth every 6 (six) hours as needed.   aspirin EC 81 MG tablet Take 81 mg by mouth daily. Swallow whole.   carvedilol 12.5 MG tablet Commonly known as: COREG Take 12.5 mg by mouth 2 (two) times daily.   finasteride 5 MG tablet Commonly known as: PROSCAR Take 5 mg by mouth every morning.   gabapentin 600 MG tablet Commonly known as: NEURONTIN Take 600 mg by mouth 2 (two) times daily.   lisinopril 2.5 MG tablet Commonly known as: ZESTRIL Take 1 tablet (2.5 mg total) by mouth daily. What changed: when to take this   metFORMIN 500 MG tablet Commonly known as: GLUCOPHAGE Take 500 mg by mouth 2 (two) times daily.   methocarbamol 750 MG tablet Commonly known as: ROBAXIN Take 1 tablet (750 mg total) by mouth 3 (three) times daily.    oxyCODONE 5 MG immediate release tablet Commonly known as: Oxy IR/ROXICODONE Take 1 tablet (5 mg total) by mouth every 3 (three) hours as needed for moderate pain ((score 4 to 6)).   polyethylene glycol 17 g packet Commonly known as: MIRALAX / GLYCOLAX Take 17 g by mouth 2 (two) times daily.   rosuvastatin 5 MG tablet Commonly known as: CRESTOR Take 5 mg by mouth daily.   senna 8.6 MG Tabs tablet Commonly known as: SENOKOT Take 1 tablet (8.6 mg total) by mouth daily as needed for mild constipation.   tamsulosin 0.4 MG Caps capsule Commonly known as: FLOMAX Take 0.4 mg by mouth daily.        Allergies:  Allergies  Allergen Reactions   Clonidine Derivatives     Hallucinations    Fioricet [Butalbital-Apap-Caffeine]     hallucination   Hydrocodone-Acetaminophen     hallucinations   Mobic [Meloxicam]     hallucinations   Norvasc [Amlodipine Besylate]     hallucinations   Statins     Muscle cramps   Tramadol Itching    Family History: Family History  Problem Relation Age of Onset   Clotting disorder Father    Hypertension Father     Social History:  reports that he has never smoked. He quit smokeless tobacco use about 44 years ago.  His smokeless tobacco use included chew. He reports that he does not currently use alcohol. He reports that he does not use drugs.   Physical Exam: BP (!) 148/87   Pulse 85   Ht 5\' 9"  (1.753 m)   Wt 202 lb 4 oz (91.7 kg)   BMI 29.87 kg/m   Constitutional:  Alert and oriented, No acute distress. HEENT: [place] AT, moist mucus membranes.  Trachea midline, no masses. GU: Foley in place draining clear urine.   Neurologic: Grossly intact, no focal deficits, moving all 4 extremities. Psychiatric: Normal mood and affect.   Assessment & Plan:    Urinary retention  - Voiding trial today.   - Return later this afternoon to ensure he's emptying adequately.   - We'll have him return in a few weeks for IPSS, PVR, and a rectal exam.    -   His PSA is normal.   - Based on his history, no concern for significant BPH symptoms. It's likely exacerbated by pain medications, immobility, surgery, constipation, etc.   - Continue Flomax and Finasteride.  2. Acute kidney injury  -  Resolved with foley catheter placement.   3. History of gross hematuria  -  It was in the remote past in the setting of initiation of anticoagulation.   - He's not had any episodes of gross or microscopic blood since that time.   - He's had multiple additional imaging studies since then with no obvious pathology. It's reasonable to defer cystoscopy at this time unless he fails his voiding trial.  Return in about 2 weeks (around 04/24/2022) for IPSS, PVR, DRE.  I have reviewed the above documentation for accuracy and completeness, and I agree with the above.   Vanna Scotland, MD  [facility] [address], [address] [place], [place] [address] [phone]

## 2022-04-10 NOTE — Progress Notes (Signed)
Patient returned to clinic this afternoon.  He had been pushing fluids but was unable to void.  He described abdominal pain.  Foley catheter was replaced with urinary output, see separate procedure note for details.  Will plan for repeat voiding trial in at least 1 week.  Notably, he did had some mild gross hematuria with rapid bladder decompression.  Would recommend cystoscopy for further evaluation following his next voiding trial.

## 2022-04-10 NOTE — Progress Notes (Signed)
Simple Catheter Placement  Due to urinary retention patient is present today for a foley cath placement.  Patient was cleaned and prepped in a sterile fashion with betadine and 2% lidocaine jelly was instilled into the urethra. A 16 FR foley catheter was inserted, urine return was noted  1300 ml, urine was clear, yellow in color.  The balloon was filled with 10cc of sterile water.  A night bag was attached for drainage. Patient was also given a night bag to take home and was given instruction on how to change from one bag to another.  Patient was given instruction on proper catheter care.  Patient tolerated well, no complications were noted   Performed by: Hans Eden, RMA

## 2022-04-10 NOTE — Progress Notes (Signed)
Fill and Pull Catheter Removal  Patient is present today for a catheter removal.  Patient was cleaned and prepped in a sterile fashion 300 ml of sterile water/ saline was instilled into the bladder when the patient felt the urge to urinate. 60ml of water was then drained from the balloon.  A 16 FR foley cath was removed from the bladder no complications were noted .  Patient as then given some time to void on their own.  Patient cannot void on their own after some time.  Patient tolerated well.  Performed by: Delane Stalling H RMA  Follow up/ Additional notes: come back this afternoon if not able to void today, otherwise, come back in 2 week for follow up

## 2022-04-18 ENCOUNTER — Ambulatory Visit: Payer: PPO | Admitting: Physician Assistant

## 2022-04-18 ENCOUNTER — Telehealth: Payer: Self-pay | Admitting: Physician Assistant

## 2022-04-18 VITALS — BP 161/67 | HR 83

## 2022-04-18 DIAGNOSIS — S31000A Unspecified open wound of lower back and pelvis without penetration into retroperitoneum, initial encounter: Secondary | ICD-10-CM | POA: Diagnosis not present

## 2022-04-18 DIAGNOSIS — R339 Retention of urine, unspecified: Secondary | ICD-10-CM | POA: Diagnosis not present

## 2022-04-18 LAB — BLADDER SCAN AMB NON-IMAGING: Scan Result: 951

## 2022-04-18 NOTE — Progress Notes (Signed)
Catheter Removal  Patient is present today for a catheter removal.  10 ml of water was drained from the balloon. A 16FR foley cath was removed from the bladder, no complications were noted. Patient tolerated well.  Performed by: Randa Lynn, RMA

## 2022-04-18 NOTE — Telephone Encounter (Signed)
Please contact the patient and let him know that after he left clinic today, I discussed his case further with Dr. Apolinar Junes.  She would like to have him pursue urodynamics at [facility] for further testing to ensure that his bladder is functioning appropriately.  I placed a referral to them today and they will contact him to schedule this test.  Based on these results, he may not need the cystoscopy and TRUS we discussed in clinic today, however he should still keep the appointment with Dr. Apolinar Junes to review his results and come up with a treatment plan.

## 2022-04-18 NOTE — Progress Notes (Signed)
04/18/2022 4:52 PM   Michael Humphrey 01-Apr-1950 [id number]  CC: Chief Complaint  Patient presents with   Follow-up   HPI: Michael Humphrey is a 72 y.o. male with PMH postop urinary retention after undergoing lumbar fusion with Dr. Myer Haff on 03/22/2022 who presents today for repeat voiding trial.   Foley catheter removed in the morning, see separate procedure note for details.  He returned to clinic in the afternoon for PVR with his wife.  He reports he has been able to urinate and have multiple bowel movements throughout the day, however his stream has been weak.  PVR 951 mL.  He also reports a "rash" on his buttocks and requests a topical cream to address this.  He later states that he is frustrated with his urinary retention and he does not wish to continue living like this.  He states he would sooner kill himself then continue with this problem.  On further questioning, he insists that he is not serious about this threat.  PMH: Past Medical History:  Diagnosis Date   Anginal pain    Arthritis    BPH (benign prostatic hyperplasia)    CAD (coronary artery disease)    a.) LHC 08/22/2012: normal coronaries; b.) LHC 06/08/2021: EF 55-65%. 30% oLM, 100% p-mLAD, 50% o-pLAD, 60% pLAD, 30% pRCA, 75% RPDA, 75% RPAV -- med mgmt.   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   DDD (degenerative disc disease), lumbar    Diabetic retinopathy    DISH (diffuse idiopathic skeletal hyperostosis)    Diverticulitis    Full dentures    GERD (gastroesophageal reflux disease)    Heart attack    High cholesterol     Hordeolum internum right lower eyelid 12/09/2019   Hypertension    Incomplete right bundle branch block (RBBB)    Lumbar radiculitis    a.) s/p L4-L5 decompression   Lumbar radiculopathy    Posterior capsular opacification, 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 Janee Morn recently and sent here   Pulmonary embolism    Skin cancer    Sleep apnea    a.) unable to tolerate nocturnal PAP therapy   Spinal stenosis of lumbar region, unspecified whether neurogenic claudication present    Statin intolerance    T2DM (type 2 diabetes mellitus)    Tubular adenoma of colon     Surgical History: Past Surgical History:  Procedure Laterality Date   AMPUTATION TOE Left 01/15/2020   Procedure: AMPUTATION TOE MPJ T1;  Surgeon: Gwyneth Revels, DPM;  Location: [facility];  Service: Podiatry;  Laterality: Left;   APPLICATION OF INTRAOPERATIVE CT SCAN N/A 03/19/2022   Procedure: APPLICATION OF INTRAOPERATIVE CT SCAN;  Surgeon: Venetia Night, MD;  Location: [facility];  Service: Neurosurgery;  Laterality: N/A;   BUNIONECTOMY     CATARACT EXTRACTION W/PHACO Left 12/11/2017   Procedure: CATARACT EXTRACTION PHACO AND INTRAOCULAR LENS PLACEMENT (IOC)  LEFT DIABETIC;  Surgeon: Lockie Mola, MD;  Location: [facility];  Service: Ophthalmology;  Laterality: Left;  DIABETIC ([facility] picking pt up at 0600, needs 0630 arrival time)   CATARACT EXTRACTION W/PHACO Right 01/15/2018   Procedure: CATARACT EXTRACTION PHACO  AND INTRAOCULAR LENS PLACEMENT (IOC)  RIGHT DIABETIC  leave so arrival will be around 7:45 ds;  Surgeon: Lockie Mola, MD;  Location: [facility];  Service: Ophthalmology;  Laterality: Right;  Diabetic - oral meds   CHOLECYSTECTOMY     COLONOSCOPY     COLONOSCOPY N/A 08/22/2021   Procedure: COLONOSCOPY;  Surgeon: Midge Minium, MD;  Location: [facility];  Service: Endoscopy;  Laterality: N/A;   COLONOSCOPY WITH PROPOFOL N/A 12/17/2014   Procedure: COLONOSCOPY WITH PROPOFOL;   Surgeon: Christena Deem, MD;  Location: [facility] ENDOSCOPY;  Service: Endoscopy;  Laterality: N/A;   correct hammertoe     FASCIECTOMY Right 10/29/2019   Procedure: PLANTAR FIBROMA RESECTION RIGHT;  Surgeon: Rosetta Posner, DPM;  Location: [facility];  Service: Podiatry;  Laterality: Right;  Diabetic - oral meds   JOINT REPLACEMENT     KNEE ARTHROSCOPY Right    LEFT HEART CATH AND CORONARY ANGIOGRAPHY N/A 06/08/2021   Procedure: LEFT HEART CATH AND CORONARY ANGIOGRAPHY;  Surgeon: Marcina Millard, MD;  Location: [facility];  Service: Cardiovascular;  Laterality: N/A;   LEFT HEART CATH AND CORONARY ANGIOGRAPHY Left 08/22/2012   Procedure: LEFT HEART CATH AND CORONARY ANGIOGRAPHY; Location: [facility]; Surgeon: Harold Hedge, MD   LUMBAR LAMINECTOMY/ DECOMPRESSION WITH MET-RX N/A 02/18/2019   Procedure: L4-5 DECOMPRESSION;  Surgeon: Venetia Night, MD;  Location: [facility];  Service: Neurosurgery;  Laterality: N/A;   SHOULDER ARTHROSCOPY WITH OPEN ROTATOR CUFF REPAIR AND DISTAL CLAVICLE ACROMINECTOMY Right 11/16/2021   Procedure: SHOULDER ARTHROSCOPY WITH OPEN ROTATOR CUFF REPAIR AND DISTAL CLAVICLE ACROMINECTOMY;  Surgeon: Juanell Fairly, MD;  Location: [facility];  Service: Orthopedics;  Laterality: Right;   TOTAL HIP ARTHROPLASTY Bilateral    TOTAL KNEE ARTHROPLASTY Bilateral    TRANSFORAMINAL LUMBAR INTERBODY FUSION W/ MIS 2 LEVEL N/A 03/19/2022   Procedure: L4-S1 MINIMALLY INVASIVE (MIS) TRANSFORAMINAL LUMBAR INTERBODY FUSION (TLIF);  Surgeon: Venetia Night, MD;  Location: [facility];  Service: Neurosurgery;  Laterality: N/A;    Home Medications:  Allergies as of 04/18/2022       Reactions   Clonidine Derivatives    Hallucinations   Fioricet [butalbital-apap-caffeine]    hallucination   Hydrocodone-acetaminophen    hallucinations   Mobic [meloxicam]    hallucinations   Norvasc [amlodipine Besylate]    hallucinations   Statins    Muscle cramps   Tramadol  Itching        Medication List        Accurate as of April 18, 2022  4:52 PM. If you have any questions, ask your nurse or doctor.          acetaminophen 500 MG tablet Commonly known as: TYLENOL Take 1,000 mg by mouth every 6 (six) hours as needed.   aspirin EC 81 MG tablet Take 81 mg by mouth daily. Swallow whole.   carvedilol 12.5 MG tablet Commonly known as: COREG Take 12.5 mg by mouth 2 (two) times daily.   finasteride 5 MG tablet Commonly known as: PROSCAR Take 5 mg by mouth every morning.   gabapentin 600 MG tablet Commonly known as: NEURONTIN Take 600 mg by mouth 2 (two) times daily.   lisinopril 2.5 MG tablet Commonly known as: ZESTRIL Take 1 tablet (2.5 mg total) by mouth daily. What changed: when to take this   metFORMIN 500 MG tablet Commonly known as: GLUCOPHAGE Take 500 mg by mouth 2 (two) times daily.   methocarbamol 750 MG tablet Commonly known as: ROBAXIN Take 1 tablet (  750 mg total) by mouth 3 (three) times daily.   oxyCODONE 5 MG immediate release tablet Commonly known as: Oxy IR/ROXICODONE Take 1 tablet (5 mg total) by mouth every 3 (three) hours as needed for moderate pain ((score 4 to 6)).   polyethylene glycol 17 g packet Commonly known as: MIRALAX / GLYCOLAX Take 17 g by mouth 2 (two) times daily.   rosuvastatin 5 MG tablet Commonly known as: CRESTOR Take 5 mg by mouth daily.   senna 8.6 MG Tabs tablet Commonly known as: SENOKOT Take 1 tablet (8.6 mg total) by mouth daily as needed for mild constipation.   tamsulosin 0.4 MG Caps capsule Commonly known as: FLOMAX Take 0.4 mg by mouth daily.        Allergies:  Allergies  Allergen Reactions   Clonidine Derivatives     Hallucinations    Fioricet [Butalbital-Apap-Caffeine]     hallucination   Hydrocodone-Acetaminophen     hallucinations   Mobic [Meloxicam]     hallucinations   Norvasc [Amlodipine Besylate]     hallucinations   Statins     Muscle cramps   Tramadol  Itching    Family History: Family History  Problem Relation Age of Onset   Clotting disorder Father    Hypertension Father     Social History:   reports that he has never smoked. He quit smokeless tobacco use about 44 years ago.  His smokeless tobacco use included chew. He reports that he does not currently use alcohol. He reports that he does not use drugs.  Physical Exam: BP (!) 161/67   Pulse 83   Constitutional:  Alert and oriented, no acute distress, nontoxic appearing HEENT: [place], AT Cardiovascular: No clubbing, cyanosis. BLE edema with weeping wound over left shin Respiratory: Normal respiratory effort, no increased work of breathing Skin: Discoloration of the sacral area, tender. Skin largely intact. No drainage. Neurologic: Grossly intact, no focal deficits, moving all 4 extremities Psychiatric: Normal mood and affect  Laboratory Data: Results for orders placed or performed in visit on 04/18/22  BLADDER SCAN AMB NON-IMAGING  Result Value Ref Range   Scan Result 951 ml    Simple Catheter Placement  Due to urinary retention patient is present today for a foley cath placement.  Patient was cleaned and prepped in a sterile fashion with betadine and 2% lidocaine jelly was instilled into the urethra. A 16 FR coude foley catheter was inserted, urine return was noted  , urine was yellow in color.  The balloon was filled with 10cc of sterile water.  A night bag was attached for drainage. Patient tolerated well, no complications were noted.   Performed by: Carman Ching, PA-C   Assessment & Plan:   1. Urinary retention Voiding trial failed again today.  He was initially reluctant to have Foley catheter replaced, but ultimately agreed, see procedure note above.  He insisted twice that his suicidal comments were not serious and I urged him to proceed to the emergency department if he starts to seriously think about self-harm.  Will have him follow-up for cystoscopy  TRUS with Dr. Apolinar Junes next month.  In the meantime, she recommended pursuing urodynamics, referral placed today.  Based on urodynamics results, may decide against cystoscopy TRUS at the time of his upcoming visit, but would like him to follow-up with her in clinic regardless. - BLADDER SCAN AMB NON-IMAGING - Ambulatory referral to Urology  2. Wound of sacral region, initial encounter Skin largely intact.  We discussed applying a  sacral foam dressing.  I offered them a wound clinic referral, but they declined.  Return in about 4 weeks (around 05/16/2022) for Cysto and TRUS with Dr. Apolinar Junes with [facility] prior.  Carman Ching, PA-C  [facility] [place] [address], [address] [place], [place] [address] [phone]

## 2022-04-18 NOTE — Patient Instructions (Addendum)
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 You will need to apply sacral foam dressings, like those pictured below, to help this heal. You can order these online or purchase them at a medical supply store. If the wound does not heal on its own, please call me and I will refer you to the wound clinic for further evaluation.

## 2022-04-20 NOTE — Telephone Encounter (Signed)
Spoke with patient and advised results, pt will wait for the call from [facility].

## 2022-04-24 DIAGNOSIS — R600 Localized edema: Secondary | ICD-10-CM | POA: Diagnosis not present

## 2022-04-24 DIAGNOSIS — I10 Essential (primary) hypertension: Secondary | ICD-10-CM | POA: Diagnosis not present

## 2022-04-30 ENCOUNTER — Other Ambulatory Visit: Payer: Self-pay

## 2022-04-30 DIAGNOSIS — G8929 Other chronic pain: Secondary | ICD-10-CM

## 2022-05-01 ENCOUNTER — Ambulatory Visit (INDEPENDENT_AMBULATORY_CARE_PROVIDER_SITE_OTHER): Payer: PPO | Admitting: Neurosurgery

## 2022-05-01 ENCOUNTER — Ambulatory Visit
Admission: RE | Admit: 2022-05-01 | Discharge: 2022-05-01 | Disposition: A | Discharge status: Home / Self Care | Payer: PPO | Attending: Neurosurgery | Admitting: Neurosurgery

## 2022-05-01 ENCOUNTER — Encounter: Payer: Self-pay | Admitting: Neurosurgery

## 2022-05-01 ENCOUNTER — Ambulatory Visit
Admission: RE | Admit: 2022-05-01 | Discharge: 2022-05-01 | Disposition: A | Discharge status: Home / Self Care | Payer: PPO | Source: Ambulatory Visit | Attending: Neurosurgery | Admitting: Neurosurgery

## 2022-05-01 VITALS — BP 138/78 | HR 68 | Ht 69.0 in | Wt 202.0 lb

## 2022-05-01 DIAGNOSIS — M5440 Lumbago with sciatica, unspecified side: Secondary | ICD-10-CM | POA: Diagnosis not present

## 2022-05-01 DIAGNOSIS — M5442 Lumbago with sciatica, left side: Secondary | ICD-10-CM | POA: Insufficient documentation

## 2022-05-01 DIAGNOSIS — Z981 Arthrodesis status: Secondary | ICD-10-CM

## 2022-05-01 DIAGNOSIS — G8929 Other chronic pain: Secondary | ICD-10-CM

## 2022-05-01 DIAGNOSIS — Z09 Encounter for follow-up examination after completed treatment for conditions other than malignant neoplasm: Secondary | ICD-10-CM

## 2022-05-01 NOTE — Progress Notes (Signed)
   REFERRING PHYSICIAN:  Lauro Regulus, Md [address] [facility] [address] [place],  [place] [address]  DOS: 03/19/22 L4-5 and L5-S1 TLIF and posterior spinal fusion   HISTORY OF PRESENT ILLNESS: Michael Humphrey is status post lumbar fusion.  He has had some use with postoperative urinary retention.  He has a Foley catheter in place.  He did have preoperative urinary retention and was dealing with benign prostatic hypertrophy.  His back and leg pain are improved compared to before surgery.   PHYSICAL EXAMINATION:  General: Patient is well developed, well nourished, calm, collected, and in no apparent distress.   NEUROLOGICAL:  General: In no acute distress.   Awake, alert, oriented to person, place, and time.  Pupils equal round and reactive to light.    Strength: 4+/5 throughout BLE  He is walking with a Matus.  Incision c/d/I and healing well.    ROS (Neurologic):  Negative except as noted above  IMAGING: MRI and [facility] at [facility] are reviewed.  There is no significant central compression.  His neuroforaminal compression appears improved.  CT confirms appropriate placement of implants.  His current x-rays show no complications.  ASSESSMENT/PLAN:  Michael Humphrey is doing well from a lumbar standpoint after his lumbar fusion.  I think he is dealing with some exacerbation of his known preoperative urinary retention.  I am hopeful that he will be able to begin urinating and normally tomorrow.  He is seeing urology for this.  Will see him back in approximately 6 weeks.  He will continue therapy for now.   Venetia Night MD Department of neurosurgery

## 2022-05-03 ENCOUNTER — Telehealth: Payer: Self-pay | Admitting: Urology

## 2022-05-03 DIAGNOSIS — R338 Other retention of urine: Secondary | ICD-10-CM | POA: Diagnosis not present

## 2022-05-03 NOTE — Telephone Encounter (Signed)
Patient dropped in the office today with questions about a hospital procedure that "he would be put to sleep for". He said he just had his appointment in [place], and was told this. I told him we have him scheduled for cysto/trus on 5/15 here in our office, and he wanted to know what that would do. He said he is tired of carrying the bag around. He would like someone to call him to answer his questions.

## 2022-05-03 NOTE — Telephone Encounter (Signed)
Spoke with patient and explained Cysto/TRUS procedure. Patient understood.

## 2022-05-04 ENCOUNTER — Telehealth: Payer: Self-pay | Admitting: *Deleted

## 2022-05-04 NOTE — Telephone Encounter (Signed)
Pt walked in today with a leaky urine night bag, changed bag and reminded pt of his next appt.

## 2022-05-16 ENCOUNTER — Ambulatory Visit: Payer: PPO | Admitting: Urology

## 2022-05-16 VITALS — BP 165/76 | HR 73 | Wt 198.0 lb

## 2022-05-16 DIAGNOSIS — R339 Retention of urine, unspecified: Secondary | ICD-10-CM | POA: Diagnosis not present

## 2022-05-16 NOTE — Progress Notes (Signed)
Marcelle Overlie Plume,acting as a scribe for Vanna Scotland, MD.,have documented all relevant documentation on the behalf of Vanna Scotland, MD,as directed by  Vanna Scotland, MD while in the presence of Vanna Scotland, MD.  05/16/2022 12:52 PM   Michael Humphrey Apr 10, 1950 [id number]  Referring provider: Lauro Regulus, MD [address] [facility] [place],  [place] [address]  No chief complaint on file.   HPI: 72 year-old male with urinary retention. He presents today for follow up urodynamics. He developed acute urinary retention along with acute kidney injury following lumbar fusion on 03/22/2022.   He has a personal history of chronic low back pain and sciatica. He underwent lumbar fusion on 03/22/2022. His post-operative course was complicated by urinary retention. As a result he ended up being admitted to [facility] with renal failure, thought to be secondary to massive retention. Creatinine was greater than four. A foley catheter was placed during the admission; he failed a voiding trial. Catheters were then replaced. He was started on Flomax and has already gone chronic Finasteride which was started in 2020. His creatinine trended back down to normal levels prior to discharge.   He has failed multiple voiding trials at this point, despite being on Flomax and chronic finasteride.  He underwent urodynamics on 05/03/2022, which showed maximum capacity of 404. His first sensation was at 264 mLs. He had a strong desire to void at 342 and a stronger desire at 379 mLs. It had some very low amplitude instability with a maximum unstable contraction of only 13 cm of water. He was never able to generate a voluntary contraction and void. Trabeculation and elevated bladder neck was noted. No reflux was seen. His Foley catheter was replaced.   He is very frustrated today about having a Foley catheter.    PMH: Past Medical History:  Diagnosis Date   Anginal pain (HCC)     Arthritis    BPH (benign prostatic hyperplasia)    CAD (coronary artery disease)    a.) LHC 08/22/2012: normal coronaries; b.) LHC 06/08/2021: EF 55-65%. 30% oLM, 100% p-mLAD, 50% o-pLAD, 60% pLAD, 30% pRCA, 75% RPDA, 75% RPAV -- med mgmt.   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   DDD (degenerative disc disease), lumbar    Diabetic retinopathy (HCC)    DISH (diffuse idiopathic skeletal hyperostosis)    Diverticulitis    Full dentures    GERD (gastroesophageal reflux disease)    Heart attack (HCC)    High cholesterol    Hordeolum internum right lower eyelid 12/09/2019   Hypertension    Incomplete right bundle branch block (RBBB)    Lumbar radiculitis    a.) s/p L4-L5 decompression   Lumbar radiculopathy    Posterior capsular opacification, 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 Janee Morn recently and sent here   Pulmonary embolism (HCC)    Skin cancer    Sleep apnea    a.) unable to tolerate nocturnal PAP therapy   Spinal stenosis of lumbar region, unspecified whether neurogenic claudication present    Statin intolerance    T2DM (type 2 diabetes mellitus) (HCC)  Tubular adenoma of colon     Surgical History: Past Surgical History:  Procedure Laterality Date   AMPUTATION TOE Left 01/15/2020   Procedure: AMPUTATION TOE MPJ T1;  Surgeon: Gwyneth Revels, DPM;  Location: [facility] ORS;  Service: Podiatry;  Laterality: Left;   APPLICATION OF INTRAOPERATIVE CT SCAN N/A 03/19/2022   Procedure: APPLICATION OF INTRAOPERATIVE CT SCAN;  Surgeon: Venetia Night, MD;  Location: [facility] ORS;  Service: Neurosurgery;   Laterality: N/A;   BUNIONECTOMY     CATARACT EXTRACTION W/PHACO Left 12/11/2017   Procedure: CATARACT EXTRACTION PHACO AND INTRAOCULAR LENS PLACEMENT (IOC)  LEFT DIABETIC;  Surgeon: Lockie Mola, MD;  Location: [facility];  Service: Ophthalmology;  Laterality: Left;  DIABETIC (ACTA picking pt up at 0600, needs 0630 arrival time)   CATARACT EXTRACTION W/PHACO Right 01/15/2018   Procedure: CATARACT EXTRACTION PHACO AND INTRAOCULAR LENS PLACEMENT (IOC)  RIGHT DIABETIC  leave so arrival will be around 7:45 ds;  Surgeon: Lockie Mola, MD;  Location: [facility];  Service: Ophthalmology;  Laterality: Right;  Diabetic - oral meds   CHOLECYSTECTOMY     COLONOSCOPY     COLONOSCOPY N/A 08/22/2021   Procedure: COLONOSCOPY;  Surgeon: Midge Minium, MD;  Location: [facility] END[facility];  Service: Endoscopy;  Laterality: N/A;   COLONOSCOPY WITH PROPOFOL N/A 12/17/2014   Procedure: COLONOSCOPY WITH PROPOFOL;  Surgeon: Christena Deem, MD;  Location: [facility] ENDOSCOPY;  Service: Endoscopy;  Laterality: N/A;   correct hammertoe     FASCIECTOMY Right 10/29/2019   Procedure: PLANTAR FIBROMA RESECTION RIGHT;  Surgeon: Rosetta Posner, DPM;  Location: [facility];  Service: Podiatry;  Laterality: Right;  Diabetic - oral meds   JOINT REPLACEMENT     KNEE ARTHROSCOPY Right    LEFT HEART CATH AND CORONARY ANGIOGRAPHY N/A 06/08/2021   Procedure: LEFT HEART CATH AND CORONARY ANGIOGRAPHY;  Surgeon: Marcina Millard, MD;  Location: [facility] INVASIVE CV LAB;  Service: Cardiovascular;  Laterality: N/A;   LEFT HEART CATH AND CORONARY ANGIOGRAPHY Left 08/22/2012   Procedure: LEFT HEART CATH AND CORONARY ANGIOGRAPHY; Location: [facility]; Surgeon: Harold Hedge, MD   LUMBAR LAMINECTOMY/ DECOMPRESSION WITH MET-RX N/A 02/18/2019   Procedure: L4-5 DECOMPRESSION;  Surgeon: Venetia Night, MD;  Location: [facility] ORS;  Service: Neurosurgery;  Laterality: N/A;   SHOULDER ARTHROSCOPY WITH OPEN ROTATOR CUFF  REPAIR AND DISTAL CLAVICLE ACROMINECTOMY Right 11/16/2021   Procedure: SHOULDER ARTHROSCOPY WITH OPEN ROTATOR CUFF REPAIR AND DISTAL CLAVICLE ACROMINECTOMY;  Surgeon: Juanell Fairly, MD;  Location: [facility] OR[facility];  Service: Orthopedics;  Laterality: Right;   TOTAL HIP ARTHROPLASTY Bilateral    TOTAL KNEE ARTHROPLASTY Bilateral    TRANSFORAMINAL LUMBAR INTERBODY FUSION W/ MIS 2 LEVEL N/A 03/19/2022   Procedure: L4-S1 MINIMALLY INVASIVE (MIS) TRANSFORAMINAL LUMBAR INTERBODY FUSION (TLIF);  Surgeon: Venetia Night, MD;  Location: [facility] ORS;  Service: Neurosurgery;  Laterality: N/A;    Home Medications:  Allergies as of 05/16/2022       Reactions   Clonidine Derivatives    Hallucinations   Fioricet [butalbital-apap-caffeine]    hallucination   Hydrocodone-acetaminophen    hallucinations   Mobic [meloxicam]    hallucinations   Norvasc [amlodipine Besylate]    hallucinations   Statins    Muscle cramps   Tramadol Itching        Medication List        Accurate as of May 16, 2022 12:52 PM. If you have any questions, ask your nurse or doctor.          acetaminophen 500 MG  tablet Commonly known as: TYLENOL Take 1,000 mg by mouth every 6 (six) hours as needed.   aspirin EC 81 MG tablet Take 81 mg by mouth daily. Swallow whole.   carvedilol 12.5 MG tablet Commonly known as: COREG Take 12.5 mg by mouth 2 (two) times daily.   finasteride 5 MG tablet Commonly known as: PROSCAR Take 5 mg by mouth every morning.   gabapentin 600 MG tablet Commonly known as: NEURONTIN Take 600 mg by mouth 2 (two) times daily.   lisinopril 2.5 MG tablet Commonly known as: ZESTRIL Take 1 tablet (2.5 mg total) by mouth daily. What changed: when to take this   metFORMIN 500 MG tablet Commonly known as: GLUCOPHAGE Take 500 mg by mouth 2 (two) times daily.   methocarbamol 750 MG tablet Commonly known as: ROBAXIN Take 1 tablet (750 mg total) by mouth 3 (three) times daily.   oxyCODONE 5 MG  immediate release tablet Commonly known as: Oxy IR/ROXICODONE Take 1 tablet (5 mg total) by mouth every 3 (three) hours as needed for moderate pain ((score 4 to 6)).   rosuvastatin 5 MG tablet Commonly known as: CRESTOR Take 5 mg by mouth daily.   senna 8.6 MG Tabs tablet Commonly known as: SENOKOT Take 1 tablet (8.6 mg total) by mouth daily as needed for mild constipation.   tamsulosin 0.4 MG Caps capsule Commonly known as: FLOMAX Take 0.4 mg by mouth daily.        Allergies:  Allergies  Allergen Reactions   Clonidine Derivatives     Hallucinations    Fioricet [Butalbital-Apap-Caffeine]     hallucination   Hydrocodone-Acetaminophen     hallucinations   Mobic [Meloxicam]     hallucinations   Norvasc [Amlodipine Besylate]     hallucinations   Statins     Muscle cramps   Tramadol Itching    Family History: Family History  Problem Relation Age of Onset   Clotting disorder Father    Hypertension Father     Social History:  reports that he has never smoked. He quit smokeless tobacco use about 44 years ago.  His smokeless tobacco use included chew. He reports that he does not currently use alcohol. He reports that he does not use drugs.   Physical Exam: BP (!) 165/76   Pulse 73   Wt 198 lb (89.8 kg)   BMI 29.24 kg/m   Constitutional:  Alert and oriented, No acute distress.  By his wife today. HEENT: [place] AT, moist mucus membranes.  Trachea midline, no masses. Neurologic: Grossly intact, no focal deficits, moving all 4 extremities. Psychiatric: Normal mood and affect.  Assessment & Plan:    1. Urinary retention - No voluntary contraction and only very low amplitude instability, urodynamics consistent with a neurogenic component to his urinary retention - Will discuss this with Dr. Myer Haff. - May also have neurogenic component related to chronic outlet obstruction evolved chronically and now decompensated - We did discuss an outlet procedure today although if  he has detrusor hypofunction, this could be ineffective  - Discussed that he would have a high risk of ongoing urinary retention despite the outlet procedure and may also leak urine and have incontinence. - He agreed to return for CIC teaching and if he is unable to do this or not able to tolerate it we will move forward with CystoTRUS and continue the conversation about an outlet procedure.  - With CIC, he may start voiding spontaneously with time - He had very little  understanding but his wife seemed to understand the clinical situation quite well.   Return for CIC teaching.  I have reviewed the above documentation for accuracy and completeness, and I agree with the above.   Vanna Scotland, MD   [facility] [address], [address] [place], [place] [address] [phone]  I spent 43 total minutes on the day of the encounter including pre-visit review of the medical record, face-to-face time with the patient, and post visit ordering of labs/imaging/tests.

## 2022-05-22 ENCOUNTER — Ambulatory Visit: Payer: PPO | Admitting: Physician Assistant

## 2022-05-22 VITALS — BP 128/77 | HR 84 | Ht 72.0 in | Wt 198.0 lb

## 2022-05-22 DIAGNOSIS — R339 Retention of urine, unspecified: Secondary | ICD-10-CM | POA: Diagnosis not present

## 2022-05-22 NOTE — Patient Instructions (Signed)
Please start self-catheterization THREE TIMES DAILY in addition to urinating on your own. You may also catheterize as needed if you feel you need to urinate but cannot go on your own.  I'll see you back in clinic in 2 weeks to see how things are going. Please keep a record of the volume of urine that you are draining from your bladder with each catheterization and bring it with you to that visit.     Step 1 Get all of your supplies ready and place near you. Step 2 Wash your hands, or put on gloves. Step 3 Wash around the tip of your penis with warm antibacterial soapy water. Step 4 Take catheter out of package and drain the lubricant over toilet. Step 5 While holding the penis at a 45 degree angle from the stomach in one hand and the catheter in the other hand  Step 6 Insert the catheter slowly into your urethra. If there is resistance when the catheter reaches the sphincter muscle,              take a deep breath and gently apply steady pressure.              DO NOT FORCE THE CATHETER Step 7 When the urine begins to flow insert another inch and lower penis. Allow the urine to flow into the toilet. Step 8 When the flow of urine stops, slowly remove the catheter.

## 2022-05-22 NOTE — Progress Notes (Signed)
Continuous Intermittent Catheterization  Due to urinary retention patient is present today for a teaching of self I & O Catheterization. Patient was given detailed verbal and printed instructions of self catheterization. Patient was cleaned and prepped in a sterile fashion.  With instruction and assistance patient inserted a 16FR Coloplast SpeediCath Coude Standard and urine return was noted 15 ml, urine was yellow in color. Patient tolerated well, no complications were noted Patient was given a sample bag with supplies to take home.  Instructions were given for patient to cath 3 times daily.  An order was placed with [employer] for catheters to be sent to the patient's home. Patient is to follow up in 2 weeks with recorded cath volumes.  Performed by: Carman Ching, PA-C   Additional Notes: Patient has urinary retention and must CIC 3x/day with coude catheter due to BPH, for which he will be unable to pass a straight catheter.  Follow up: Return in about 2 weeks (around 06/05/2022) for CIC follow-up.

## 2022-06-05 ENCOUNTER — Ambulatory Visit: Payer: PPO | Admitting: Physician Assistant

## 2022-06-05 ENCOUNTER — Encounter: Payer: Self-pay | Admitting: Physician Assistant

## 2022-06-05 VITALS — BP 127/73 | HR 80 | Ht 72.0 in | Wt 182.6 lb

## 2022-06-05 DIAGNOSIS — R339 Retention of urine, unspecified: Secondary | ICD-10-CM | POA: Diagnosis not present

## 2022-06-05 NOTE — Progress Notes (Signed)
06/05/2022 1:44 PM   Michael Humphrey 1950-05-14 [id number]  CC: Chief Complaint  Patient presents with   Follow-up   HPI: Michael Humphrey is a 72 y.o. male with PMH BPH with a recent history of urinary retention after undergoing lumbar fusion currently managed with CIC who was unable to generate a voluntary contraction on urodynamics who presents today for CIC follow-up.  He is accompanied today by his wife, who contributes to HPI.  He has been self cathing 3 times daily without difficulty.  Average output is 400 to 600 mL.  They have not yet received a shipment of Coloplast catheters, because they had to mail in a check and it seems that payment has not yet been received.  They tried to purchase catheters to use in the interim, but he was unable to pass a straight catheter to the level of his bladder.  He is scheduled for second opinion at [facility] in September 2024.  He still wishes to pursue outlet procedures if at all possible.  PMH: Past Medical History:  Diagnosis Date   Anginal pain (HCC)    Arthritis    BPH (benign prostatic hyperplasia)    CAD (coronary artery disease)    a.) LHC 08/22/2012: normal coronaries; b.) LHC 06/08/2021: EF 55-65%. 30% oLM, 100% p-mLAD, 50% o-pLAD, 60% pLAD, 30% pRCA, 75% RPDA, 75% RPAV -- med mgmt.   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   DDD (degenerative disc disease), lumbar    Diabetic retinopathy (HCC)    DISH (diffuse idiopathic skeletal hyperostosis)    Diverticulitis    Full dentures    GERD (gastroesophageal reflux disease)    Heart attack (HCC)     High cholesterol    Hordeolum internum right lower eyelid 12/09/2019   Hypertension    Incomplete right bundle branch block (RBBB)    Lumbar radiculitis    a.) s/p L4-L5 decompression   Lumbar radiculopathy    Posterior capsular opacification, 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 Janee Morn recently and sent here   Pulmonary embolism (HCC)    Skin cancer    Sleep apnea    a.) unable to tolerate nocturnal PAP therapy   Spinal stenosis of lumbar region, unspecified whether neurogenic claudication present    Statin intolerance    T2DM (type 2 diabetes mellitus) (HCC)    Tubular adenoma of colon     Surgical History: Past Surgical History:  Procedure Laterality Date   AMPUTATION TOE Left 01/15/2020   Procedure: AMPUTATION TOE MPJ T1;  Surgeon: Gwyneth Revels, DPM;  Location: [facility] OR[facility];  Service: Podiatry;  Laterality: Left;   APPLICATION OF INTRAOPERATIVE CT SCAN N/A 03/19/2022   Procedure: APPLICATION OF INTRAOPERATIVE CT SCAN;  Surgeon: Venetia Night, MD;  Location: [facility];  Service: Neurosurgery;  Laterality: N/A;   BUNIONECTOMY     CATARACT EXTRACTION W/PHACO Left 12/11/2017   Procedure: CATARACT EXTRACTION PHACO AND INTRAOCULAR LENS PLACEMENT (IOC)  LEFT DIABETIC;  Surgeon: Lockie Mola, MD;  Location: [facility];  Service: Ophthalmology;  Laterality: Left;  DIABETIC (ACTA picking pt up at 0600, needs 0630 arrival time)   CATARACT  EXTRACTION W/PHACO Right 01/15/2018   Procedure: CATARACT EXTRACTION PHACO AND INTRAOCULAR LENS PLACEMENT (IOC)  RIGHT DIABETIC  leave so arrival will be around 7:45 ds;  Surgeon: Lockie Mola, MD;  Location: [facility];  Service: Ophthalmology;  Laterality: Right;  Diabetic - oral meds   CHOLECYSTECTOMY     COLONOSCOPY     COLONOSCOPY N/A 08/22/2021   Procedure: COLONOSCOPY;  Surgeon: Midge Minium, MD;  Location: [facility] END[facility]COPY;  Service: Endoscopy;  Laterality: N/A;   COLONOSCOPY WITH PROPOFOL N/A 12/17/2014    Procedure: COLONOSCOPY WITH PROPOFOL;  Surgeon: Christena Deem, MD;  Location: [facility] ENDOSCOPY;  Service: Endoscopy;  Laterality: N/A;   correct hammertoe     FASCIECTOMY Right 10/29/2019   Procedure: PLANTAR FIBROMA RESECTION RIGHT;  Surgeon: Rosetta Posner, DPM;  Location: [facility];  Service: Podiatry;  Laterality: Right;  Diabetic - oral meds   JOINT REPLACEMENT     KNEE ARTHROSCOPY Right    LEFT HEART CATH AND CORONARY ANGIOGRAPHY N/A 06/08/2021   Procedure: LEFT HEART CATH AND CORONARY ANGIOGRAPHY;  Surgeon: Marcina Millard, MD;  Location: [facility] INVASIVE CV LAB;  Service: Cardiovascular;  Laterality: N/A;   LEFT HEART CATH AND CORONARY ANGIOGRAPHY Left 08/22/2012   Procedure: LEFT HEART CATH AND CORONARY ANGIOGRAPHY; Location: [facility]; Surgeon: Harold Hedge, MD   LUMBAR LAMINECTOMY/ DECOMPRESSION WITH MET-RX N/A 02/18/2019   Procedure: L4-5 DECOMPRESSION;  Surgeon: Venetia Night, MD;  Location: [facility] OR[facility];  Service: Neurosurgery;  Laterality: N/A;   SHOULDER ARTHROSCOPY WITH OPEN ROTATOR CUFF REPAIR AND DISTAL CLAVICLE ACROMINECTOMY Right 11/16/2021   Procedure: SHOULDER ARTHROSCOPY WITH OPEN ROTATOR CUFF REPAIR AND DISTAL CLAVICLE ACROMINECTOMY;  Surgeon: Juanell Fairly, MD;  Location: [facility];  Service: Orthopedics;  Laterality: Right;   TOTAL HIP ARTHROPLASTY Bilateral    TOTAL KNEE ARTHROPLASTY Bilateral    TRANSFORAMINAL LUMBAR INTERBODY FUSION W/ MIS 2 LEVEL N/A 03/19/2022   Procedure: L4-S1 MINIMALLY INVASIVE (MIS) TRANSFORAMINAL LUMBAR INTERBODY FUSION (TLIF);  Surgeon: Venetia Night, MD;  Location: [facility] ORS;  Service: Neurosurgery;  Laterality: N/A;    Home Medications:  Allergies as of 06/05/2022       Reactions   Clonidine Derivatives    Hallucinations   Fioricet [butalbital-apap-caffeine]    hallucination   Hydrocodone-acetaminophen    hallucinations   Mobic [meloxicam]    hallucinations   Norvasc [amlodipine Besylate]    hallucinations    Statins    Muscle cramps   Tramadol Itching        Medication List        Accurate as of June 05, 2022  1:44 PM. If you have any questions, ask your nurse or doctor.          STOP taking these medications    oxyCODONE 5 MG immediate release tablet Commonly known as: Oxy IR/ROXICODONE Stopped by: Carman Ching, PA-C       TAKE these medications    acetaminophen 500 MG tablet Commonly known as: TYLENOL Take 1,000 mg by mouth every 6 (six) hours as needed.   aspirin EC 81 MG tablet Take 81 mg by mouth daily. Swallow whole.   carvedilol 12.5 MG tablet Commonly known as: COREG Take 12.5 mg by mouth 2 (two) times daily.   finasteride 5 MG tablet Commonly known as: PROSCAR Take 5 mg by mouth every morning.   gabapentin 600 MG tablet Commonly known as: NEURONTIN Take 600 mg by mouth 2 (two) times daily.   lisinopril 2.5 MG tablet Commonly known as: ZESTRIL Take 1  tablet (2.5 mg total) by mouth daily. What changed: when to take this   metFORMIN 500 MG tablet Commonly known as: GLUCOPHAGE Take 500 mg by mouth 2 (two) times daily.   methocarbamol 750 MG tablet Commonly known as: ROBAXIN Take 1 tablet (750 mg total) by mouth 3 (three) times daily.   rosuvastatin 5 MG tablet Commonly known as: CRESTOR Take 5 mg by mouth daily.   senna 8.6 MG Tabs tablet Commonly known as: SENOKOT Take 1 tablet (8.6 mg total) by mouth daily as needed for mild constipation.   tamsulosin 0.4 MG Caps capsule Commonly known as: FLOMAX Take 0.4 mg by mouth daily.        Allergies:  Allergies  Allergen Reactions   Clonidine Derivatives     Hallucinations    Fioricet [Butalbital-Apap-Caffeine]     hallucination   Hydrocodone-Acetaminophen     hallucinations   Mobic [Meloxicam]     hallucinations   Norvasc [Amlodipine Besylate]     hallucinations   Statins     Muscle cramps   Tramadol Itching    Family History: Family History  Problem Relation Age of  Onset   Clotting disorder Father    Hypertension Father     Social History:   reports that he has never smoked. He quit smokeless tobacco use about 44 years ago.  His smokeless tobacco use included chew. He reports that he does not currently use alcohol. He reports that he does not use drugs.  Physical Exam: BP 127/73   Pulse 80   Ht 6' (1.829 m)   Wt 182 lb 9 oz (82.8 kg)   BMI 24.76 kg/m   Constitutional:  Alert and oriented, no acute distress, nontoxic appearing HEENT: [place], AT Cardiovascular: No clubbing, cyanosis, or edema Respiratory: Normal respiratory effort, no increased work of breathing Skin: No rashes, bruises or suspicious lesions Neurologic: Grossly intact, no focal deficits, moving all 4 extremities Psychiatric: Normal mood and affect  Laboratory Data: Results for orders placed or performed in visit on 04/18/22  BLADDER SCAN AMB NON-IMAGING  Result Value Ref Range   Scan Result 951 ml    Assessment & Plan:   1. Urinary retention Well-managed with CIC 3 times daily.  Will plan to continue this.  Again, he has chronic urinary retention and BPH and is unable to pass a straight tipped Foley catheter to the level of his bladder, requiring a coud.  I gave him a box of [employer] samples today to last him until he receives a shipment.  Agree with second opinion at [facility].  He may follow-up with me as needed.  Return if symptoms worsen or fail to improve.  Carman Ching, PA-C  [facility] [place] [address] [place], [place] [address] [phone]

## 2022-06-07 ENCOUNTER — Other Ambulatory Visit: Payer: Self-pay

## 2022-06-07 ENCOUNTER — Ambulatory Visit
Admission: RE | Admit: 2022-06-07 | Discharge: 2022-06-07 | Disposition: A | Discharge status: Home / Self Care | Payer: PPO | Attending: Neurosurgery | Admitting: Neurosurgery

## 2022-06-07 ENCOUNTER — Ambulatory Visit (INDEPENDENT_AMBULATORY_CARE_PROVIDER_SITE_OTHER): Payer: PPO | Admitting: Neurosurgery

## 2022-06-07 ENCOUNTER — Ambulatory Visit
Admission: RE | Admit: 2022-06-07 | Discharge: 2022-06-07 | Disposition: A | Discharge status: Home / Self Care | Payer: PPO | Source: Ambulatory Visit | Attending: Neurosurgery | Admitting: Neurosurgery

## 2022-06-07 ENCOUNTER — Encounter: Payer: Self-pay | Admitting: Neurosurgery

## 2022-06-07 VITALS — BP 110/72 | Ht 69.0 in | Wt 182.0 lb

## 2022-06-07 DIAGNOSIS — M5442 Lumbago with sciatica, left side: Secondary | ICD-10-CM | POA: Insufficient documentation

## 2022-06-07 DIAGNOSIS — G8929 Other chronic pain: Secondary | ICD-10-CM

## 2022-06-07 DIAGNOSIS — Z09 Encounter for follow-up examination after completed treatment for conditions other than malignant neoplasm: Secondary | ICD-10-CM

## 2022-06-07 DIAGNOSIS — Z4789 Encounter for other orthopedic aftercare: Secondary | ICD-10-CM | POA: Diagnosis not present

## 2022-06-07 DIAGNOSIS — Z981 Arthrodesis status: Secondary | ICD-10-CM | POA: Diagnosis not present

## 2022-06-07 NOTE — Progress Notes (Signed)
   REFERRING PHYSICIAN:  Lauro Regulus, Md [address] [facility] [address] [place],  [place] [address]  DOS: 03/19/22 L4-5 and L5-S1 TLIF and posterior spinal fusion   HISTORY OF PRESENT ILLNESS:  Michael Humphrey is a 72 year old male presenting status post lumbar fusion.  He continues to report significant improvement in his back and leg pain as compared to preop.  He has since gotten rid of his indwelling Foley catheter and is intermittently in and out cathing himself.  He has graduated from using a Suleiman and is now using a cane for assistance when walking.  His primary concern today is weakness in his legs and difficulty getting up from a seated position.  05/01/22 Michael Humphrey is status post lumbar fusion.  He has had some use with postoperative urinary retention.  He has a Foley catheter in place.  He did have preoperative urinary retention and was dealing with benign prostatic hypertrophy.  His back and leg pain are improved compared to before surgery.   PHYSICAL EXAMINATION:  General: Patient is well developed, well nourished, calm, collected, and in no apparent distress.   NEUROLOGICAL:  General: In no acute distress.   Awake, alert, oriented to person, place, and time.  Pupils equal round and reactive to light.    Strength: 5/5 throughout BLE  He is walking with a cane  Incision well healed   ROS (Neurologic):  Negative except as noted above  IMAGING: Lumbar xrays 06/07/22 There is no evidence of hardware malfunction  ASSESSMENT/PLAN:  Michael Humphrey is doing well from a lumbar standpoint after his lumbar fusion.  All in all he has made improvements both postoperatively and since his last follow-up visit.  His strength in his legs despite his concern for weakness is neurologically improved.  He has since been discharged from home physical therapy.  He was offered outpatient physical therapy but declined.  I encouraged him to remain  as active as possible and to resume activities as tolerated.  We will see him back in 6 months with lumbar x-rays prior or sooner should he have any questions or concerns.  He expressed understanding and was in agreement with this plan.  I spent a total of 20 minutes in both face-to-face and non-face-to-face activities for this visit on the date of this encounter including review of imaging, discussion of symptoms, discussion of plan of care, and documentation.  Manning Charity PA-C Department of neurosurgery

## 2022-06-11 DIAGNOSIS — N401 Enlarged prostate with lower urinary tract symptoms: Secondary | ICD-10-CM | POA: Diagnosis not present

## 2022-06-11 DIAGNOSIS — H33321 Round hole, right eye: Secondary | ICD-10-CM | POA: Diagnosis not present

## 2022-06-11 DIAGNOSIS — Z794 Long term (current) use of insulin: Secondary | ICD-10-CM | POA: Diagnosis not present

## 2022-06-11 DIAGNOSIS — R339 Retention of urine, unspecified: Secondary | ICD-10-CM | POA: Diagnosis not present

## 2022-06-11 DIAGNOSIS — E113411 Type 2 diabetes mellitus with severe nonproliferative diabetic retinopathy with macular edema, right eye: Secondary | ICD-10-CM | POA: Diagnosis not present

## 2022-06-11 DIAGNOSIS — E113412 Type 2 diabetes mellitus with severe nonproliferative diabetic retinopathy with macular edema, left eye: Secondary | ICD-10-CM | POA: Diagnosis not present

## 2022-06-11 DIAGNOSIS — H43811 Vitreous degeneration, right eye: Secondary | ICD-10-CM | POA: Diagnosis not present

## 2022-06-12 DIAGNOSIS — L989 Disorder of the skin and subcutaneous tissue, unspecified: Secondary | ICD-10-CM | POA: Diagnosis not present

## 2022-06-25 DIAGNOSIS — E1122 Type 2 diabetes mellitus with diabetic chronic kidney disease: Secondary | ICD-10-CM | POA: Diagnosis not present

## 2022-06-25 DIAGNOSIS — N183 Chronic kidney disease, stage 3 unspecified: Secondary | ICD-10-CM | POA: Diagnosis not present

## 2022-06-25 DIAGNOSIS — Z125 Encounter for screening for malignant neoplasm of prostate: Secondary | ICD-10-CM | POA: Diagnosis not present

## 2022-06-25 DIAGNOSIS — Z0000 Encounter for general adult medical examination without abnormal findings: Secondary | ICD-10-CM | POA: Diagnosis not present

## 2022-06-25 DIAGNOSIS — I779 Disorder of arteries and arterioles, unspecified: Secondary | ICD-10-CM | POA: Diagnosis not present

## 2022-06-25 DIAGNOSIS — I129 Hypertensive chronic kidney disease with stage 1 through stage 4 chronic kidney disease, or unspecified chronic kidney disease: Secondary | ICD-10-CM | POA: Diagnosis not present

## 2022-06-26 DIAGNOSIS — L989 Disorder of the skin and subcutaneous tissue, unspecified: Secondary | ICD-10-CM | POA: Diagnosis not present

## 2022-07-02 DIAGNOSIS — I2583 Coronary atherosclerosis due to lipid rich plaque: Secondary | ICD-10-CM | POA: Diagnosis not present

## 2022-07-02 DIAGNOSIS — M791 Myalgia, unspecified site: Secondary | ICD-10-CM | POA: Diagnosis not present

## 2022-07-02 DIAGNOSIS — I779 Disorder of arteries and arterioles, unspecified: Secondary | ICD-10-CM | POA: Diagnosis not present

## 2022-07-02 DIAGNOSIS — G4733 Obstructive sleep apnea (adult) (pediatric): Secondary | ICD-10-CM | POA: Diagnosis not present

## 2022-07-02 DIAGNOSIS — I2510 Atherosclerotic heart disease of native coronary artery without angina pectoris: Secondary | ICD-10-CM | POA: Diagnosis not present

## 2022-07-02 DIAGNOSIS — N183 Chronic kidney disease, stage 3 unspecified: Secondary | ICD-10-CM | POA: Diagnosis not present

## 2022-07-02 DIAGNOSIS — E1122 Type 2 diabetes mellitus with diabetic chronic kidney disease: Secondary | ICD-10-CM | POA: Diagnosis not present

## 2022-07-02 DIAGNOSIS — T466X5A Adverse effect of antihyperlipidemic and antiarteriosclerotic drugs, initial encounter: Secondary | ICD-10-CM | POA: Diagnosis not present

## 2022-07-02 DIAGNOSIS — I129 Hypertensive chronic kidney disease with stage 1 through stage 4 chronic kidney disease, or unspecified chronic kidney disease: Secondary | ICD-10-CM | POA: Diagnosis not present

## 2022-07-02 DIAGNOSIS — I10 Essential (primary) hypertension: Secondary | ICD-10-CM | POA: Diagnosis not present

## 2022-08-24 DIAGNOSIS — E113413 Type 2 diabetes mellitus with severe nonproliferative diabetic retinopathy with macular edema, bilateral: Secondary | ICD-10-CM | POA: Diagnosis not present

## 2022-08-24 DIAGNOSIS — H43811 Vitreous degeneration, right eye: Secondary | ICD-10-CM | POA: Diagnosis not present

## 2022-08-24 DIAGNOSIS — H4312 Vitreous hemorrhage, left eye: Secondary | ICD-10-CM | POA: Diagnosis not present

## 2022-08-24 DIAGNOSIS — Z794 Long term (current) use of insulin: Secondary | ICD-10-CM | POA: Diagnosis not present

## 2022-08-24 DIAGNOSIS — E113512 Type 2 diabetes mellitus with proliferative diabetic retinopathy with macular edema, left eye: Secondary | ICD-10-CM | POA: Diagnosis not present

## 2022-08-24 DIAGNOSIS — H33321 Round hole, right eye: Secondary | ICD-10-CM | POA: Diagnosis not present

## 2022-08-27 ENCOUNTER — Encounter: Payer: Self-pay | Admitting: Internal Medicine

## 2022-08-27 ENCOUNTER — Inpatient Hospital Stay (HOSPITAL_COMMUNITY)
Admission: AD | Admit: 2022-08-27 | Discharge: 2022-08-29 | DRG: 116 | Disposition: A | Discharge status: Home / Self Care | Payer: PPO | Source: Ambulatory Visit | Attending: Internal Medicine | Admitting: Internal Medicine

## 2022-08-27 ENCOUNTER — Other Ambulatory Visit: Payer: Self-pay

## 2022-08-27 ENCOUNTER — Inpatient Hospital Stay (HOSPITAL_COMMUNITY): Payer: PPO | Admitting: Anesthesiology

## 2022-08-27 ENCOUNTER — Inpatient Hospital Stay (HOSPITAL_COMMUNITY): Payer: PPO

## 2022-08-27 ENCOUNTER — Inpatient Hospital Stay (HOSPITAL_COMMUNITY)
Admission: AD | Disposition: A | Discharge status: Home / Self Care | Payer: Self-pay | Source: Ambulatory Visit | Attending: Internal Medicine

## 2022-08-27 DIAGNOSIS — Z7984 Long term (current) use of oral hypoglycemic drugs: Secondary | ICD-10-CM

## 2022-08-27 DIAGNOSIS — Z89422 Acquired absence of other left toe(s): Secondary | ICD-10-CM

## 2022-08-27 DIAGNOSIS — Z794 Long term (current) use of insulin: Secondary | ICD-10-CM | POA: Diagnosis not present

## 2022-08-27 DIAGNOSIS — Z8673 Personal history of transient ischemic attack (TIA), and cerebral infarction without residual deficits: Secondary | ICD-10-CM

## 2022-08-27 DIAGNOSIS — N390 Urinary tract infection, site not specified: Secondary | ICD-10-CM | POA: Diagnosis present

## 2022-08-27 DIAGNOSIS — G4730 Sleep apnea, unspecified: Secondary | ICD-10-CM | POA: Diagnosis present

## 2022-08-27 DIAGNOSIS — Z888 Allergy status to other drugs, medicaments and biological substances status: Secondary | ICD-10-CM

## 2022-08-27 DIAGNOSIS — N200 Calculus of kidney: Secondary | ICD-10-CM | POA: Diagnosis not present

## 2022-08-27 DIAGNOSIS — I2510 Atherosclerotic heart disease of native coronary artery without angina pectoris: Secondary | ICD-10-CM | POA: Diagnosis not present

## 2022-08-27 DIAGNOSIS — H4312 Vitreous hemorrhage, left eye: Secondary | ICD-10-CM | POA: Diagnosis not present

## 2022-08-27 DIAGNOSIS — Z96643 Presence of artificial hip joint, bilateral: Secondary | ICD-10-CM | POA: Diagnosis not present

## 2022-08-27 DIAGNOSIS — Z885 Allergy status to narcotic agent status: Secondary | ICD-10-CM

## 2022-08-27 DIAGNOSIS — I679 Cerebrovascular disease, unspecified: Secondary | ICD-10-CM | POA: Diagnosis present

## 2022-08-27 DIAGNOSIS — H109 Unspecified conjunctivitis: Principal | ICD-10-CM | POA: Diagnosis present

## 2022-08-27 DIAGNOSIS — E113299 Type 2 diabetes mellitus with mild nonproliferative diabetic retinopathy without macular edema, unspecified eye: Secondary | ICD-10-CM | POA: Diagnosis not present

## 2022-08-27 DIAGNOSIS — I252 Old myocardial infarction: Secondary | ICD-10-CM

## 2022-08-27 DIAGNOSIS — E113512 Type 2 diabetes mellitus with proliferative diabetic retinopathy with macular edema, left eye: Secondary | ICD-10-CM | POA: Diagnosis not present

## 2022-08-27 DIAGNOSIS — J3489 Other specified disorders of nose and nasal sinuses: Secondary | ICD-10-CM | POA: Diagnosis present

## 2022-08-27 DIAGNOSIS — Z86010 Personal history of colonic polyps: Secondary | ICD-10-CM | POA: Diagnosis not present

## 2022-08-27 DIAGNOSIS — Z961 Presence of intraocular lens: Secondary | ICD-10-CM | POA: Diagnosis present

## 2022-08-27 DIAGNOSIS — Z85828 Personal history of other malignant neoplasm of skin: Secondary | ICD-10-CM | POA: Diagnosis not present

## 2022-08-27 DIAGNOSIS — I10 Essential (primary) hypertension: Secondary | ICD-10-CM | POA: Diagnosis present

## 2022-08-27 DIAGNOSIS — R109 Unspecified abdominal pain: Secondary | ICD-10-CM | POA: Diagnosis present

## 2022-08-27 DIAGNOSIS — E118 Type 2 diabetes mellitus with unspecified complications: Secondary | ICD-10-CM | POA: Diagnosis not present

## 2022-08-27 DIAGNOSIS — Z86711 Personal history of pulmonary embolism: Secondary | ICD-10-CM | POA: Diagnosis not present

## 2022-08-27 DIAGNOSIS — H4419 Other endophthalmitis: Secondary | ICD-10-CM | POA: Diagnosis not present

## 2022-08-27 DIAGNOSIS — Z87891 Personal history of nicotine dependence: Secondary | ICD-10-CM | POA: Diagnosis not present

## 2022-08-27 DIAGNOSIS — N400 Enlarged prostate without lower urinary tract symptoms: Secondary | ICD-10-CM | POA: Diagnosis not present

## 2022-08-27 DIAGNOSIS — E7800 Pure hypercholesterolemia, unspecified: Secondary | ICD-10-CM | POA: Diagnosis not present

## 2022-08-27 DIAGNOSIS — Z86718 Personal history of other venous thrombosis and embolism: Secondary | ICD-10-CM

## 2022-08-27 DIAGNOSIS — I4510 Unspecified right bundle-branch block: Secondary | ICD-10-CM | POA: Diagnosis present

## 2022-08-27 DIAGNOSIS — H44002 Unspecified purulent endophthalmitis, left eye: Secondary | ICD-10-CM

## 2022-08-27 DIAGNOSIS — Z96653 Presence of artificial knee joint, bilateral: Secondary | ICD-10-CM | POA: Diagnosis present

## 2022-08-27 DIAGNOSIS — E1151 Type 2 diabetes mellitus with diabetic peripheral angiopathy without gangrene: Secondary | ICD-10-CM

## 2022-08-27 DIAGNOSIS — K5730 Diverticulosis of large intestine without perforation or abscess without bleeding: Secondary | ICD-10-CM | POA: Diagnosis not present

## 2022-08-27 DIAGNOSIS — Z886 Allergy status to analgesic agent status: Secondary | ICD-10-CM

## 2022-08-27 DIAGNOSIS — M1990 Unspecified osteoarthritis, unspecified site: Secondary | ICD-10-CM | POA: Diagnosis present

## 2022-08-27 DIAGNOSIS — K219 Gastro-esophageal reflux disease without esophagitis: Secondary | ICD-10-CM | POA: Diagnosis present

## 2022-08-27 DIAGNOSIS — Z8249 Family history of ischemic heart disease and other diseases of the circulatory system: Secondary | ICD-10-CM

## 2022-08-27 DIAGNOSIS — E113413 Type 2 diabetes mellitus with severe nonproliferative diabetic retinopathy with macular edema, bilateral: Secondary | ICD-10-CM | POA: Diagnosis not present

## 2022-08-27 DIAGNOSIS — Z7982 Long term (current) use of aspirin: Secondary | ICD-10-CM

## 2022-08-27 DIAGNOSIS — R339 Retention of urine, unspecified: Secondary | ICD-10-CM | POA: Diagnosis not present

## 2022-08-27 DIAGNOSIS — H21542 Posterior synechiae (iris), left eye: Secondary | ICD-10-CM | POA: Diagnosis not present

## 2022-08-27 DIAGNOSIS — Z9841 Cataract extraction status, right eye: Secondary | ICD-10-CM

## 2022-08-27 DIAGNOSIS — H43811 Vitreous degeneration, right eye: Secondary | ICD-10-CM | POA: Diagnosis not present

## 2022-08-27 DIAGNOSIS — B952 Enterococcus as the cause of diseases classified elsewhere: Secondary | ICD-10-CM | POA: Diagnosis not present

## 2022-08-27 DIAGNOSIS — Z9842 Cataract extraction status, left eye: Secondary | ICD-10-CM

## 2022-08-27 DIAGNOSIS — Z79899 Other long term (current) drug therapy: Secondary | ICD-10-CM

## 2022-08-27 DIAGNOSIS — H33321 Round hole, right eye: Secondary | ICD-10-CM | POA: Diagnosis not present

## 2022-08-27 HISTORY — PX: PARS PLANA VITRECTOMY: SHX2166

## 2022-08-27 LAB — GLUCOSE, CAPILLARY
Glucose-Capillary: 114 mg/dL — ABNORMAL HIGH (ref 70–99)
Glucose-Capillary: 103 mg/dL — ABNORMAL HIGH (ref 70–99)
Glucose-Capillary: 114 mg/dL — ABNORMAL HIGH (ref 70–99)
Glucose-Capillary: 107 mg/dL — ABNORMAL HIGH (ref 70–99)

## 2022-08-27 LAB — BASIC METABOLIC PANEL
Anion gap: 12 (ref 5–15)
BUN: 23 mg/dL (ref 8–23)
CO2: 21 mmol/L — ABNORMAL LOW (ref 22–32)
Calcium: 9.3 mg/dL (ref 8.9–10.3)
Chloride: 103 mmol/L (ref 98–111)
Creatinine, Ser: 1.29 mg/dL — ABNORMAL HIGH (ref 0.61–1.24)
GFR, Estimated: 60 mL/min — ABNORMAL LOW (ref >60)
Glucose, Bld: 113 mg/dL — ABNORMAL HIGH (ref 70–99)
Potassium: 3.6 mmol/L (ref 3.5–5.1)
Sodium: 136 mmol/L (ref 135–145)

## 2022-08-27 LAB — COMPREHENSIVE METABOLIC PANEL
ALT: 18 U/L (ref 0–44)
AST: 18 U/L (ref 15–41)
Albumin: 3.3 g/dL — ABNORMAL LOW (ref 3.5–5.0)
Alkaline Phosphatase: 125 U/L (ref 38–126)
Anion gap: 12 (ref 5–15)
BUN: 23 mg/dL (ref 8–23)
CO2: 21 mmol/L — ABNORMAL LOW (ref 22–32)
Calcium: 8.7 mg/dL — ABNORMAL LOW (ref 8.9–10.3)
Chloride: 105 mmol/L (ref 98–111)
Creatinine, Ser: 1.19 mg/dL (ref 0.61–1.24)
GFR, Estimated: >60 mL/min (ref >60)
Glucose, Bld: 116 mg/dL — ABNORMAL HIGH (ref 70–99)
Potassium: 3.6 mmol/L (ref 3.5–5.1)
Sodium: 138 mmol/L (ref 135–145)
Total Bilirubin: 0.8 mg/dL (ref 0.3–1.2)
Total Protein: 6.5 g/dL (ref 6.5–8.1)

## 2022-08-27 LAB — CBC WITH DIFFERENTIAL/PLATELET
Abs Immature Granulocytes: 0.09 10*3/uL — ABNORMAL HIGH (ref 0.00–0.07)
Basophils Absolute: 0 10*3/uL (ref 0.0–0.1)
Basophils Relative: 0 %
Eosinophils Absolute: 0 10*3/uL (ref 0.0–0.5)
Eosinophils Relative: 0 %
HCT: 38.9 % — ABNORMAL LOW (ref 39.0–52.0)
Hemoglobin: 12.9 g/dL — ABNORMAL LOW (ref 13.0–17.0)
Immature Granulocytes: 1 %
Lymphocytes Relative: 12 %
Lymphs Abs: 1.1 10*3/uL (ref 0.7–4.0)
MCH: 31.7 pg (ref 26.0–34.0)
MCHC: 33.2 g/dL (ref 30.0–36.0)
MCV: 95.6 fL (ref 80.0–100.0)
Monocytes Absolute: 0.4 10*3/uL (ref 0.1–1.0)
Monocytes Relative: 4 %
Neutro Abs: 7.4 10*3/uL (ref 1.7–7.7)
Neutrophils Relative %: 83 %
Platelets: 209 10*3/uL (ref 150–400)
RBC: 4.07 MIL/uL — ABNORMAL LOW (ref 4.22–5.81)
RDW: 13.8 % (ref 11.5–15.5)
WBC: 9 10*3/uL (ref 4.0–10.5)
nRBC: 0 % (ref 0.0–0.2)

## 2022-08-27 LAB — CBC
HCT: 42.6 % (ref 39.0–52.0)
Hemoglobin: 14.4 g/dL (ref 13.0–17.0)
MCH: 31.8 pg (ref 26.0–34.0)
MCHC: 33.8 g/dL (ref 30.0–36.0)
MCV: 94 fL (ref 80.0–100.0)
Platelets: 241 10*3/uL (ref 150–400)
RBC: 4.53 MIL/uL (ref 4.22–5.81)
RDW: 13.7 % (ref 11.5–15.5)
WBC: 9.7 10*3/uL (ref 4.0–10.5)
nRBC: 0 % (ref 0.0–0.2)

## 2022-08-27 LAB — HEMOGLOBIN A1C
Hgb A1c MFr Bld: 6.4 % — ABNORMAL HIGH (ref 4.8–5.6)
Mean Plasma Glucose: 136.98 mg/dL

## 2022-08-27 SURGERY — PARS PLANA VITRECTOMY 25 GAUGE FOR ENDOPHTHALMITIS
Anesthesia: General | Laterality: Left

## 2022-08-27 MED ORDER — EPINEPHRINE PF 1 MG/ML IJ SOLN
INTRAMUSCULAR | Status: AC
  Filled 2022-08-27: qty 1

## 2022-08-27 MED ORDER — SODIUM CHLORIDE 0.9 % IV SOLN
1.0000 g | INTRAVENOUS | Status: DC
  Administered 2022-08-27: 1 g via INTRAVENOUS
  Filled 2022-08-27: qty 10

## 2022-08-27 MED ORDER — TETRACAINE HCL 0.5 % OP SOLN
OPHTHALMIC | Status: AC
  Filled 2022-08-27: qty 4

## 2022-08-27 MED ORDER — GABAPENTIN 300 MG PO CAPS
600.0000 mg | ORAL_CAPSULE | Freq: Two times a day (BID) | ORAL | Status: DC
  Administered 2022-08-27 – 2022-08-29 (×4): 600 mg via ORAL
  Filled 2022-08-27 (×4): qty 2

## 2022-08-27 MED ORDER — GATIFLOXACIN 0.5 % OP SOLN
1.0000 [drp] | OPHTHALMIC | Status: AC | PRN
  Administered 2022-08-27 (×3): 1 [drp] via OPHTHALMIC
  Filled 2022-08-27: qty 2.5

## 2022-08-27 MED ORDER — SODIUM CHLORIDE (PF) 0.9 % IJ SOLN
INTRAMUSCULAR | Status: AC
  Filled 2022-08-27: qty 10

## 2022-08-27 MED ORDER — BSS IO SOLN
INTRAOCULAR | Status: DC | PRN
  Administered 2022-08-27: 15 mL via INTRAOCULAR

## 2022-08-27 MED ORDER — MIDAZOLAM HCL 2 MG/2ML IJ SOLN
INTRAMUSCULAR | Status: DC | PRN
  Administered 2022-08-27 (×2): .5 mg via INTRAVENOUS
  Administered 2022-08-27: 1 mg via INTRAVENOUS

## 2022-08-27 MED ORDER — PROPOFOL 500 MG/50ML IV EMUL
INTRAVENOUS | Status: DC | PRN
  Administered 2022-08-27: 40 ug/kg/min via INTRAVENOUS

## 2022-08-27 MED ORDER — SODIUM CHLORIDE 0.45 % IV SOLN: INTRAVENOUS | Status: AC

## 2022-08-27 MED ORDER — GENTAMICIN SULFATE 40 MG/ML IJ SOLN
INTRAMUSCULAR | Status: AC
  Filled 2022-08-27: qty 2

## 2022-08-27 MED ORDER — FENTANYL CITRATE (PF) 250 MCG/5ML IJ SOLN
INTRAMUSCULAR | Status: AC
  Filled 2022-08-27: qty 5

## 2022-08-27 MED ORDER — ACETAMINOPHEN 500 MG PO TABS
1000.0000 mg | ORAL_TABLET | Freq: Four times a day (QID) | ORAL | Status: DC | PRN
  Administered 2022-08-27 – 2022-08-29 (×3): 1000 mg via ORAL
  Filled 2022-08-27 (×3): qty 2

## 2022-08-27 MED ORDER — BSS PLUS IO SOLN
INTRAOCULAR | Status: AC
  Filled 2022-08-27: qty 500

## 2022-08-27 MED ORDER — ONDANSETRON HCL 4 MG/2ML IJ SOLN
INTRAMUSCULAR | Status: DC | PRN
  Administered 2022-08-27: 4 mg via INTRAVENOUS

## 2022-08-27 MED ORDER — FINASTERIDE 5 MG PO TABS
5.0000 mg | ORAL_TABLET | Freq: Every day | ORAL | Status: DC
  Administered 2022-08-27 – 2022-08-29 (×3): 5 mg via ORAL
  Filled 2022-08-27 (×3): qty 1

## 2022-08-27 MED ORDER — SODIUM HYALURONATE 10 MG/ML IO SOLUTION
PREFILLED_SYRINGE | INTRAOCULAR | Status: AC
  Filled 2022-08-27: qty 0.85

## 2022-08-27 MED ORDER — SODIUM CHLORIDE 0.9 % IV SOLN: INTRAVENOUS | Status: DC

## 2022-08-27 MED ORDER — SODIUM HYALURONATE 10 MG/ML IO SOLUTION
PREFILLED_SYRINGE | INTRAOCULAR | Status: DC | PRN
  Administered 2022-08-27: .85 mL via INTRAOCULAR

## 2022-08-27 MED ORDER — VANCOMYCIN INTRAVITREAL INJECTION 1 MG/0.1 ML
1.0000 mg | INTRAOCULAR | Status: AC
  Administered 2022-08-27: 1 mg via INTRAVITREAL
  Filled 2022-08-27: qty 0.02

## 2022-08-27 MED ORDER — PHENYLEPHRINE HCL 2.5 % OP SOLN
1.0000 [drp] | OPHTHALMIC | Status: AC | PRN
  Administered 2022-08-27 (×3): 1 [drp] via OPHTHALMIC
  Filled 2022-08-27: qty 2

## 2022-08-27 MED ORDER — SODIUM CHLORIDE 0.9 % IV SOLN
1.0000 g | Freq: Once | INTRAVENOUS | Status: AC
  Administered 2022-08-27: 1 g via INTRAVENOUS
  Filled 2022-08-27: qty 1

## 2022-08-27 MED ORDER — ENSURE ENLIVE PO LIQD
237.0000 mL | Freq: Two times a day (BID) | ORAL | Status: DC
  Administered 2022-08-28 – 2022-08-29 (×2): 237 mL via ORAL

## 2022-08-27 MED ORDER — FENTANYL CITRATE (PF) 100 MCG/2ML IJ SOLN
INTRAMUSCULAR | Status: DC | PRN
  Administered 2022-08-27 (×2): 25 ug via INTRAVENOUS

## 2022-08-27 MED ORDER — ROPIVACAINE HCL 5 MG/ML IJ SOLN
INTRAMUSCULAR | Status: AC
  Filled 2022-08-27: qty 30

## 2022-08-27 MED ORDER — CYCLOPENTOLATE HCL 1 % OP SOLN
1.0000 [drp] | OPHTHALMIC | Status: AC | PRN
  Administered 2022-08-27 (×3): 1 [drp] via OPHTHALMIC
  Filled 2022-08-27: qty 2

## 2022-08-27 MED ORDER — FENTANYL CITRATE (PF) 100 MCG/2ML IJ SOLN: 25.0000 ug | INTRAMUSCULAR | Status: DC | PRN

## 2022-08-27 MED ORDER — KETOROLAC TROMETHAMINE 15 MG/ML IJ SOLN
15.0000 mg | Freq: Four times a day (QID) | INTRAMUSCULAR | Status: DC
  Administered 2022-08-27 – 2022-08-29 (×7): 15 mg via INTRAVENOUS
  Filled 2022-08-27 (×7): qty 1

## 2022-08-27 MED ORDER — TORSEMIDE 20 MG PO TABS
20.0000 mg | ORAL_TABLET | Freq: Every day | ORAL | Status: DC
  Administered 2022-08-27 – 2022-08-29 (×3): 20 mg via ORAL
  Filled 2022-08-27 (×3): qty 1

## 2022-08-27 MED ORDER — TETRACAINE HCL 0.5 % OP SOLN
OPHTHALMIC | Status: DC | PRN
  Administered 2022-08-27 (×2): 1 [drp] via OPHTHALMIC

## 2022-08-27 MED ORDER — LIDOCAINE HCL 2 % IJ SOLN
INTRAMUSCULAR | Status: AC
  Filled 2022-08-27: qty 20

## 2022-08-27 MED ORDER — TRAZODONE 25 MG HALF TABLET: 25.0000 mg | ORAL_TABLET | Freq: Every evening | ORAL | Status: DC | PRN

## 2022-08-27 MED ORDER — EPINEPHRINE PF 1 MG/ML IJ SOLN
INTRAOCULAR | Status: DC | PRN
  Administered 2022-08-27: 500 mL

## 2022-08-27 MED ORDER — POLYMYXIN B SULFATE 500000 UNITS IJ SOLR
INTRAMUSCULAR | Status: AC
  Filled 2022-08-27: qty 10

## 2022-08-27 MED ORDER — TAMSULOSIN HCL 0.4 MG PO CAPS
0.4000 mg | ORAL_CAPSULE | Freq: Every day | ORAL | Status: DC
  Administered 2022-08-27 – 2022-08-29 (×3): 0.4 mg via ORAL
  Filled 2022-08-27 (×3): qty 1

## 2022-08-27 MED ORDER — METHOCARBAMOL 750 MG PO TABS
750.0000 mg | ORAL_TABLET | Freq: Three times a day (TID) | ORAL | Status: DC
  Administered 2022-08-27 – 2022-08-29 (×7): 750 mg via ORAL
  Filled 2022-08-27 (×6): qty 1

## 2022-08-27 MED ORDER — LIDOCAINE HCL 2 % IJ SOLN
INTRAMUSCULAR | Status: DC | PRN
  Administered 2022-08-27: 10 mL

## 2022-08-27 MED ORDER — ONDANSETRON HCL 4 MG/2ML IJ SOLN: 4.0000 mg | Freq: Once | INTRAMUSCULAR | Status: DC | PRN

## 2022-08-27 MED ORDER — MIDAZOLAM HCL 2 MG/2ML IJ SOLN
INTRAMUSCULAR | Status: AC
  Filled 2022-08-27: qty 2

## 2022-08-27 MED ORDER — CARVEDILOL 12.5 MG PO TABS
12.5000 mg | ORAL_TABLET | Freq: Two times a day (BID) | ORAL | Status: DC
  Administered 2022-08-27 – 2022-08-29 (×3): 12.5 mg via ORAL
  Filled 2022-08-27 (×5): qty 1

## 2022-08-27 MED ORDER — INSULIN ASPART 100 UNIT/ML IJ SOLN: 0.0000 [IU] | Freq: Three times a day (TID) | INTRAMUSCULAR | Status: DC

## 2022-08-27 MED ORDER — ROPIVACAINE HCL 5 MG/ML IJ SOLN
INTRAMUSCULAR | Status: DC | PRN
  Administered 2022-08-27: 10 mL

## 2022-08-27 MED ORDER — HEPARIN SODIUM (PORCINE) 5000 UNIT/ML IJ SOLN
5000.0000 [IU] | Freq: Three times a day (TID) | INTRAMUSCULAR | Status: DC
  Administered 2022-08-27 – 2022-08-28 (×5): 5000 [IU] via SUBCUTANEOUS
  Filled 2022-08-27 (×6): qty 1

## 2022-08-27 MED ORDER — INSULIN ASPART 100 UNIT/ML IJ SOLN: 0.0000 [IU] | INTRAMUSCULAR | Status: DC | PRN

## 2022-08-27 MED ORDER — DEXAMETHASONE SODIUM PHOSPHATE 10 MG/ML IJ SOLN
INTRAMUSCULAR | Status: AC
  Filled 2022-08-27: qty 1

## 2022-08-27 MED ORDER — PROPOFOL 10 MG/ML IV BOLUS
INTRAVENOUS | Status: AC
  Filled 2022-08-27: qty 20

## 2022-08-27 MED ORDER — CHLORHEXIDINE GLUCONATE 0.12 % MT SOLN
15.0000 mL | Freq: Once | OROMUCOSAL | Status: AC
  Administered 2022-08-27: 15 mL via OROMUCOSAL
  Filled 2022-08-27: qty 15

## 2022-08-27 MED ORDER — LACTATED RINGERS IV SOLN: INTRAVENOUS | Status: DC

## 2022-08-27 MED ORDER — CEFTAZIDIME INTRAVITREAL INJECTION 2.25 MG/0.1 ML
2.2500 mg | INTRAVITREAL | Status: AC
  Administered 2022-08-27: 2.25 mg via INTRAVITREAL
  Filled 2022-08-27: qty 0

## 2022-08-27 MED ORDER — ROSUVASTATIN CALCIUM 5 MG PO TABS
5.0000 mg | ORAL_TABLET | Freq: Every day | ORAL | Status: DC
  Administered 2022-08-27 – 2022-08-29 (×3): 5 mg via ORAL
  Filled 2022-08-27 (×3): qty 1

## 2022-08-27 MED ORDER — LISINOPRIL 2.5 MG PO TABS
2.5000 mg | ORAL_TABLET | Freq: Every day | ORAL | Status: DC
  Administered 2022-08-27 – 2022-08-29 (×2): 2.5 mg via ORAL
  Filled 2022-08-27 (×4): qty 1

## 2022-08-27 MED ORDER — SENNA 8.6 MG PO TABS: 1.0000 | ORAL_TABLET | Freq: Every day | ORAL | Status: DC | PRN

## 2022-08-27 MED ORDER — ORAL CARE MOUTH RINSE: 15.0000 mL | Freq: Once | OROMUCOSAL | Status: AC

## 2022-08-27 MED FILL — Ceftazidime For Inj 1 GM: INTRAVITREAL | Qty: 0.1 | Status: AC

## 2022-08-27 SURGICAL SUPPLY — 45 items
APL SRG 3 HI ABS STRL LF PLS (MISCELLANEOUS)
APL SWBSTK 6 STRL LF DISP (MISCELLANEOUS) ×1
APPLICATOR COTTON TIP 6 STRL (MISCELLANEOUS) ×1 IMPLANT
APPLICATOR COTTON TIP 6IN STRL (MISCELLANEOUS) ×1 IMPLANT
APPLICATOR DR MATTHEWS STRL (MISCELLANEOUS) IMPLANT
BAG COUNTER SPONGE SURGICOUNT (BAG) ×1 IMPLANT
BAG SPNG CNTER NS LX DISP (BAG) ×1
BAND WRIST GAS GREEN (MISCELLANEOUS) IMPLANT
CANNULA ANT CHAM MAIN (OPHTHALMIC RELATED) IMPLANT
CANNULA FLEX TIP 25G (CANNULA) IMPLANT
CANNULA VLV SOFT TIP 25G (OPHTHALMIC) ×1 IMPLANT
CANNULA VLV SOFT TIP 25GA (OPHTHALMIC) ×1 IMPLANT
CORD BIPOLAR FORCEPS 12FT (ELECTRODE) IMPLANT
COVER MAYO STAND STRL (DRAPES) ×1 IMPLANT
DRAPE OPHTHALMIC 77X100 STRL (CUSTOM PROCEDURE TRAY) ×1 IMPLANT
FILTER BLUE MILLIPORE (MISCELLANEOUS) IMPLANT
FILTER STRAW FLUID ASPIR (MISCELLANEOUS) IMPLANT
FORCEPS ECKARDT ILM 25G SERR (OPHTHALMIC RELATED) ×1 IMPLANT
GLOVE SS BIOGEL STRL SZ 8.5 (GLOVE) ×1 IMPLANT
GOWN STRL REUS W/ TWL LRG LVL3 (GOWN DISPOSABLE) ×1 IMPLANT
GOWN STRL REUS W/TWL LRG LVL3 (GOWN DISPOSABLE) ×1
KIT BASIN OR (CUSTOM PROCEDURE TRAY) ×1 IMPLANT
KIT TURNOVER KIT B (KITS) ×1 IMPLANT
LENS BIOM SUPER VIEW SET DISP (MISCELLANEOUS) ×1 IMPLANT
NDL 18GX1X1/2 (RX/OR ONLY) (NEEDLE) ×1 IMPLANT
NDL 25GX 5/8IN NON SAFETY (NEEDLE) ×1 IMPLANT
NDL HYPO 30X.5 LL (NEEDLE) ×2 IMPLANT
NEEDLE 18GX1X1/2 (RX/OR ONLY) (NEEDLE) ×1 IMPLANT
NEEDLE 25GX 5/8IN NON SAFETY (NEEDLE) ×1 IMPLANT
NEEDLE HYPO 30X.5 LL (NEEDLE) ×2 IMPLANT
PACK VITRECTOMY CUSTOM (CUSTOM PROCEDURE TRAY) ×1 IMPLANT
PAD ARMBOARD 7.5X6 YLW CONV (MISCELLANEOUS) ×2 IMPLANT
PAK PIK VITRECTOMY CVS 25GA (OPHTHALMIC) ×1 IMPLANT
PENCIL BIPOLAR 25GA STR DISP (OPHTHALMIC RELATED) IMPLANT
PROBE LASER ILLUM FLEX CVD 25G (OPHTHALMIC) IMPLANT
RESERVOIR BACK FLUSH (MISCELLANEOUS) IMPLANT
ROLLS DENTAL (MISCELLANEOUS) IMPLANT
STOCKINETTE IMPERVIOUS 9X36 MD (GAUZE/BANDAGES/DRESSINGS) ×2 IMPLANT
STOPCOCK 4 WAY LG BORE MALE ST (IV SETS) IMPLANT
SUT MERSILENE 5 0 RD 1 DA (SUTURE) IMPLANT
SUT VICRYL 7 0 TG140 8 (SUTURE) IMPLANT
SWAB COLLECTION DEVICE MRSA (MISCELLANEOUS) ×1 IMPLANT
SWAB CULTURE ESWAB REG 1ML (MISCELLANEOUS) ×1 IMPLANT
SYR TB 1ML LUER SLIP (SYRINGE) ×2 IMPLANT
TOWEL GREEN STERILE FF (TOWEL DISPOSABLE) ×2 IMPLANT

## 2022-08-27 NOTE — Assessment & Plan Note (Signed)
BP stable  Plan Continue home meds 

## 2022-08-27 NOTE — Assessment & Plan Note (Signed)
Patient c/o severe frontal pain. He admits to copious sinus drainage but doesn't describe quality or color. NO fever or chills Admitted for opthalmitis without known  infection that may have been source. Very tender to percussion on exam.  Plan CT sinus  Rocephin 1g q24

## 2022-08-27 NOTE — Anesthesia Preprocedure Evaluation (Addendum)
Anesthesia Evaluation  Patient identified by MRN, date of birth, ID band Patient awake    Reviewed: Allergy & Precautions, NPO status , Patient's Chart, lab work & pertinent test results, reviewed documented beta blocker date and time   Airway Mallampati: II  TM Distance: >3 FB Neck ROM: Full    Dental  (+) Dental Advisory Given, Edentulous Lower, Edentulous Upper   Pulmonary sleep apnea    Pulmonary exam normal breath sounds clear to auscultation       Cardiovascular hypertension, Pt. on home beta blockers and Pt. on medications + CAD, + Past MI and + Peripheral Vascular Disease  Normal cardiovascular exam+ dysrhythmias (RBBB)  Rhythm:Regular Rate:Normal     Neuro/Psych  Headaches Left ENDOPHTHALMITIS  Neuromuscular disease CVA    GI/Hepatic negative GI ROS, Neg liver ROS,,,  Endo/Other  diabetes, Type 2, Oral Hypoglycemic Agents    Renal/GU negative Renal ROS     Musculoskeletal  (+) Arthritis ,    Abdominal   Peds  Hematology negative hematology ROS (+)   Anesthesia Other Findings Day of surgery medications reviewed with the patient.  Reproductive/Obstetrics                             Anesthesia Physical Anesthesia Plan  ASA: 3 and emergent  Anesthesia Plan: MAC   Post-op Pain Management: Ofirmev IV (intra-op)*   Induction: Intravenous  PONV Risk Score and Plan: 2 and Dexamethasone, Ondansetron and TIVA  Airway Management Planned: Natural Airway and Simple Face Mask  Additional Equipment:   Intra-op Plan:   Post-operative Plan:   Informed Consent: I have reviewed the patients History and Physical, chart, labs and discussed the procedure including the risks, benefits and alternatives for the proposed anesthesia with the patient or authorized representative who has indicated his/her understanding and acceptance.     Dental advisory given  Plan Discussed with:  CRNA  Anesthesia Plan Comments:        Anesthesia Quick Evaluation

## 2022-08-27 NOTE — Transfer of Care (Signed)
Immediate Anesthesia Transfer of Care Note  Patient: Michael Humphrey  Procedure(s) Performed: PARS PLANA VITRECTOMY 25 GAUGE FOR ENDOPHTHALMITIS (Left)  Patient Location: PACU  Anesthesia Type:MAC  Level of Consciousness: awake and patient cooperative  Airway & Oxygen Therapy: Patient Spontanous Breathing and Patient connected to nasal cannula oxygen  Post-op Assessment: Report given to RN and Post -op Vital signs reviewed and stable  Post vital signs: Reviewed and stable  Last Vitals:  Vitals Value Taken Time  BP 156/97 08/27/22 1423  Temp    Pulse 91 08/27/22 1424  Resp 15 08/27/22 1424  SpO2 96 % 08/27/22 1424  Vitals shown include unfiled device data.  Last Pain:  Vitals:   08/27/22 1214  TempSrc:   PainSc: 0-No pain         Complications: No notable events documented.

## 2022-08-27 NOTE — Assessment & Plan Note (Signed)
Patient with h/o urinary retention. He was self-cathing but has been able to micturate since June. He does admit to dysuria, increased frequency. ON exam he has marked tenderness left flank to palpation and percussion. U/A pending. Suspect pyelonephritis  Plan Rocephin 1 g IV q24  CT abdomen w/o contrast

## 2022-08-27 NOTE — Brief Op Note (Signed)
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  Removal of anterior chamber, iris membranes #3 anterior chamber diagnostic tap #4 vitreous washings diagnostic tap 5. injection of intravitreal antibiotics vancomycin 1.0 mg / 0.1 cc number 6 injection intravitreal antibiotics ceftazidime 2.25 mg / 0.1 cc #7 straight cath with urinalysis and urine culture at completion of ocular procedure  Surgeon Fawn Kirk, MD  Anesthesia local retrobulbar block with monitored anesthesia control  Indication for procedure profound visual loss the basis of endogenous endophthalmitis in the absence of recent trauma or surgery to the left eye

## 2022-08-27 NOTE — H&P (Signed)
History and Physical    Michael Humphrey WUJ:[id number] DOB: 1950-12-08 DOA: 08/27/2022  DOS: the patient was seen and examined on 08/27/2022  PCP: Lauro Regulus, MD   Patient coming from: Home  I have personally briefly reviewed patient's old medical records in [facility]  Patient with multiple medical problems including DM 2, HTN, HLD, CAD, OA DM retinopathy, other ophthalmologic problems but lately in good health and doing well. He denies any recent illness, denies fevers or chills, denies any open wound or known infections. However, he does endorse dysuria. Friday, 08/24/22 he presented acutely to Dr. Ephriam Knuckles office for left eye pain. He had a vitreous hemorrhage. He was seen by Dr. Luciana Axe  today, 8.26.24, dx'd with endogenous opthalmitis. He was referred to [facility] for emergent OR intervention. [facility] called to admit patient, seek source of infectious seeding leading to opthamitis and to manage chronic medical problems.    ED Course: T 97.6  130/98  98  RR 18. Patient taken to preop after labs drawn in ED. Patient seen in preop - in no distress but c/o left eye pain and frontal sinus pain. Labs available at exam - WBDC 9.7 with nl Diff, Hgb 14.4. Labs pending, CMet, U/A. [facility] asked by Dr. Luciana Axe to admit and assist with medical management of chronic problems and to evaluate for infection source of opthalmitis.   Review of Systems:  Review of Systems  Constitutional:  Negative for chills, fever and weight loss.  HENT:  Positive for congestion and sinus pain.        C/o pain across forehead/frontal sinus  Eyes:  Positive for pain.       Decreased vision left eye  Respiratory: Negative.    Cardiovascular:  Negative for chest pain, palpitations and leg swelling.  Gastrointestinal: Negative.  Negative for heartburn, nausea and vomiting.       Admits to infrequent bowel incontinence  Genitourinary:  Positive for dysuria, flank pain, frequency and urgency. Negative for hematuria.   Musculoskeletal:  Positive for back pain, falls and myalgias.  Skin: Negative.   Neurological:  Positive for headaches. Negative for focal weakness.  Endo/Heme/Allergies: Negative.   Psychiatric/Behavioral: Negative.      Past Medical History:  Diagnosis Date   Anginal pain (HCC)    Arthritis    BPH (benign prostatic hyperplasia)    CAD (coronary artery disease)    a.) LHC 08/22/2012: normal coronaries; b.) LHC 06/08/2021: EF 55-65%. 30% oLM, 100% p-mLAD, 50% o-pLAD, 60% pLAD, 30% pRCA, 75% RPDA, 75% RPAV -- med mgmt.   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   DDD (degenerative disc disease), lumbar    Diabetic retinopathy (HCC)    DISH (diffuse idiopathic skeletal hyperostosis)    Diverticulitis    Full dentures    GERD (gastroesophageal reflux disease)    Heart attack (HCC)    High cholesterol    Hordeolum internum right lower eyelid 12/09/2019   Hypertension    Incomplete right bundle branch block (RBBB)    Lumbar radiculitis    a.) s/p L4-L5 decompression   Lumbar radiculopathy    Posterior capsular opacification, right 09/26/2020  Found by Dr. Janee Morn recently and sent here   Pulmonary embolism [facility])    Skin cancer    Sleep apnea    a.) unable to tolerate nocturnal PAP therapy   Spinal stenosis of lumbar region, unspecified whether neurogenic claudication present    Statin intolerance    T2DM (type 2 diabetes mellitus) (HCC)    Tubular adenoma of colon     Past Surgical History:  Procedure Laterality Date   AMPUTATION TOE Left 01/15/2020   Procedure: AMPUTATION TOE MPJ T1;  Surgeon:  Gwyneth Revels, DPM;  Location: [facility] ORS;  Service: Podiatry;  Laterality: Left;   APPLICATION OF INTRAOPERATIVE CT SCAN N/A 03/19/2022   Procedure: APPLICATION OF INTRAOPERATIVE CT SCAN;  Surgeon: Venetia Night, MD;  Location: [facility] ORS;  Service: Neurosurgery;  Laterality: N/A;   BUNIONECTOMY     CATARACT EXTRACTION W/PHACO Left 12/11/2017   Procedure: CATARACT EXTRACTION PHACO AND INTRAOCULAR LENS PLACEMENT (IOC)  LEFT DIABETIC;  Surgeon: Lockie Mola, MD;  Location: [facility];  Service: Ophthalmology;  Laterality: Left;  DIABETIC ([facility] picking pt up at 0600, needs 0630 arrival time)   CATARACT EXTRACTION W/PHACO Right 01/15/2018   Procedure: CATARACT EXTRACTION PHACO AND INTRAOCULAR LENS PLACEMENT (IOC)  RIGHT DIABETIC  leave so arrival will be around 7:45 ds;  Surgeon: Lockie Mola, MD;  Location: [facility];  Service: Ophthalmology;  Laterality: Right;  Diabetic - oral meds   CHOLECYSTECTOMY     COLONOSCOPY     COLONOSCOPY N/A 08/22/2021   Procedure: COLONOSCOPY;  Surgeon: Midge Minium, MD;  Location: [facility] ENDOSCOPY;  Service: Endoscopy;  Laterality: N/A;   COLONOSCOPY WITH PROPOFOL N/A 12/17/2014   Procedure: COLONOSCOPY WITH PROPOFOL;  Surgeon: Christena Deem, MD;  Location: [facility] ENDOSCOPY;  Service: Endoscopy;  Laterality: N/A;   correct hammertoe     FASCIECTOMY Right 10/29/2019   Procedure: PLANTAR FIBROMA RESECTION RIGHT;  Surgeon: Rosetta Posner, DPM;  Location: [facility] SURGERY [facility];  Service: Podiatry;  Laterality: Right;  Diabetic - oral meds   JOINT REPLACEMENT     KNEE ARTHROSCOPY Right    LEFT HEART CATH AND CORONARY ANGIOGRAPHY N/A 06/08/2021   Procedure: LEFT HEART CATH AND CORONARY ANGIOGRAPHY;  Surgeon: Marcina Millard, MD;  Location: [facility] INVASIVE CV LAB;  Service: Cardiovascular;  Laterality: N/A;   LEFT HEART CATH AND CORONARY ANGIOGRAPHY Left 08/22/2012   Procedure: LEFT HEART CATH AND CORONARY ANGIOGRAPHY; Location: [facility];  Surgeon: Harold Hedge, MD   LUMBAR LAMINECTOMY/ DECOMPRESSION WITH MET-RX N/A 02/18/2019   Procedure: L4-5 DECOMPRESSION;  Surgeon: Venetia Night, MD;  Location: [facility] ORS;  Service: Neurosurgery;  Laterality: N/A;   SHOULDER ARTHROSCOPY WITH OPEN ROTATOR CUFF REPAIR AND DISTAL CLAVICLE ACROMINECTOMY Right 11/16/2021   Procedure: SHOULDER ARTHROSCOPY WITH OPEN ROTATOR CUFF REPAIR AND DISTAL CLAVICLE ACROMINECTOMY;  Surgeon: Juanell Fairly, MD;  Location: [facility] ORS;  Service: Orthopedics;  Laterality: Right;   TOTAL HIP ARTHROPLASTY Bilateral    TOTAL KNEE ARTHROPLASTY Bilateral    TRANSFORAMINAL LUMBAR INTERBODY FUSION W/ MIS 2 LEVEL N/A 03/19/2022   Procedure: L4-S1 MINIMALLY INVASIVE (MIS) TRANSFORAMINAL LUMBAR INTERBODY FUSION (TLIF);  Surgeon: Venetia Night, MD;  Location: [facility] ORS;  Service: Neurosurgery;  Laterality: N/A;    Soc Hx - married long term. Lives with his wife. Work - retired as Technical sales engineer for [employer] Remains very active   reports that he has never smoked. He quit smokeless tobacco use about 44 years ago.  His smokeless tobacco use included chew. He reports that he  does not currently use alcohol. He reports that he does not use drugs.  Allergies  Allergen Reactions   Clonidine Derivatives     Hallucinations    Fioricet [Butalbital-Apap-Caffeine]     hallucination   Hydrocodone-Acetaminophen     hallucinations   Mobic [Meloxicam]     hallucinations   Norvasc [Amlodipine Besylate]     hallucinations   Statins     Muscle cramps   Tramadol Itching    Family History  Problem Relation Age of Onset   Clotting disorder Father    Hypertension Father     Prior to Admission medications   Medication Sig Start Date End Date Taking? Authorizing Provider  acetaminophen (TYLENOL) 500 MG tablet Take 1,000 mg by mouth every 6 (six) hours as needed.   Yes [provider]  aspirin EC 81 MG tablet Take 81 mg by mouth daily. Swallow  whole.   Yes [provider]  carvedilol (COREG) 12.5 MG tablet Take 12.5 mg by mouth 2 (two) times daily.  12/04/17  Yes [provider]  finasteride (PROSCAR) 5 MG tablet Take 5 mg by mouth every morning.    Yes [provider]  gabapentin (NEURONTIN) 600 MG tablet Take 600 mg by mouth 2 (two) times daily.   Yes [provider]  lisinopril (PRINIVIL,ZESTRIL) 2.5 MG tablet Take 1 tablet (2.5 mg total) by mouth daily. Patient taking differently: Take 2.5 mg by mouth every morning. 02/28/18  Yes Sainani, Rolly Pancake, MD  metFORMIN (GLUCOPHAGE) 500 MG tablet Take 500 mg by mouth 2 (two) times daily.   Yes [provider]  methocarbamol (ROBAXIN) 750 MG tablet Take 1 tablet (750 mg total) by mouth 3 (three) times daily. 03/22/22  Yes Susanne Borders, PA  OCUFLOX 0.3 % ophthalmic solution Place 1 drop into the left eye 4 (four) times daily. 08/24/22  Yes [provider]  PRED FORTE 1 % ophthalmic suspension Place 1 drop into the left eye 4 (four) times daily. 08/24/22  Yes [provider]  rosuvastatin (CRESTOR) 5 MG tablet Take 5 mg by mouth daily.   Yes [provider]  senna (SENOKOT) 8.6 MG TABS tablet Take 1 tablet (8.6 mg total) by mouth daily as needed for mild constipation. 03/22/22  Yes Susanne Borders, PA  tamsulosin (FLOMAX) 0.4 MG CAPS capsule Take 0.4 mg by mouth daily. 04/03/22 04/03/23 Yes [provider]  torsemide (DEMADEX) 20 MG tablet Take 20 mg by mouth daily. 08/06/22  Yes [provider]    Physical Exam: Vitals:   08/27/22 1135  BP: (!) 130/98  Pulse: 98  Resp: 18  Temp: 97.6 F (36.4 C)  TempSrc: Oral  SpO2: 98%  Weight: 88.5 kg  Height: 5\' 9"  (1.753 m)    Physical Exam Vitals and nursing note reviewed.  Constitutional:      General: He is not in acute distress.    Appearance: Normal appearance. He is normal weight. He is not ill-appearing or toxic-appearing.  HENT:     Head:  Normocephalic and atraumatic.     Comments: Very tender to percussion across his forehead    Nose: Nose normal.     Mouth/Throat:     Mouth: Mucous membranes are moist.     Pharynx: Oropharynx is clear. No oropharyngeal exudate.     Comments: edentulous Eyes:     Comments: Eye exam deferred to Dr. Luciana Axe  Cardiovascular:     Rate and Rhythm: Normal rate and regular rhythm.  Pulses: Normal pulses.     Heart sounds: Normal heart sounds. No murmur heard. Pulmonary:     Effort: Pulmonary effort is normal.     Breath sounds: Normal breath sounds. No wheezing or rales.  Abdominal:     General: There is no distension.     Palpations: Abdomen is soft. There is no mass.     Tenderness: There is abdominal tenderness. There is left CVA tenderness. There is no guarding or rebound.     Comments: Hypoactive BS. MIld diffuse tenderness. More tender in LUQ, VERY tender to palpation and percussion over left flank.   Musculoskeletal:        General: Deformity present. Normal range of motion.     Cervical back: Normal range of motion and neck supple. No rigidity.     Comments: Bilateral 2+ pedal edema.  Patient with enlargement PIP and DIP joints all digits. Frozen PIP 5th right.  Lymphadenopathy:     Cervical: No cervical adenopathy.  Skin:    General: Skin is warm and dry.     Comments: Well healed TKR scars bilaterally. No open lesion. Intertriginous areas toes clear. No back wounds.  Neurological:     General: No focal deficit present.     Mental Status: He is alert and oriented to person, place, and time. Mental status is at baseline.     Cranial Nerves: No cranial nerve deficit.     Comments: No eye exam done - deferred to Dr. Luciana Axe  Psychiatric:        Mood and Affect: Mood normal.        Behavior: Behavior normal.      Labs on Admission: I have personally reviewed following labs and imaging studies  CBC: Recent Labs  Lab 08/27/22 1125  WBC 9.7  HGB 14.4  HCT 42.6  MCV 94.0   PLT 241   Basic Metabolic Panel: Recent Labs  Lab 08/27/22 1125  NA 136  K 3.6  CL 103  CO2 21*  GLUCOSE 113*  BUN 23  CREATININE 1.29*  CALCIUM 9.3   GFR: Estimated Creatinine Clearance: 58.6 mL/min (A) (by C-G formula based on SCr of 1.29 mg/dL (H)). Liver Function Tests: No results for input(s): "AST", "ALT", "ALKPHOS", "BILITOT", "PROT", "ALBUMIN" in the last 168 hours. No results for input(s): "LIPASE", "AMYLASE" in the last 168 hours. No results for input(s): "AMMONIA" in the last 168 hours. Coagulation Profile: No results for input(s): "INR", "PROTIME" in the last 168 hours. Cardiac Enzymes: No results for input(s): "CKTOTAL", "CKMB", "CKMBINDEX", "TROPONINI" in the last 168 hours. BNP (last 3 results) No results for input(s): "PROBNP" in the last 8760 hours. HbA1C: No results for input(s): "HGBA1C" in the last 72 hours. CBG: Recent Labs  Lab 08/27/22 1259  GLUCAP 114*   Lipid Profile: No results for input(s): "CHOL", "HDL", "LDLCALC", "TRIG", "CHOLHDL", "LDLDIRECT" in the last 72 hours. Thyroid Function Tests: No results for input(s): "TSH", "T4TOTAL", "FREET4", "T3FREE", "THYROIDAB" in the last 72 hours. Anemia Panel: No results for input(s): "VITAMINB12", "FOLATE", "FERRITIN", "TIBC", "IRON", "RETICCTPCT" in the last 72 hours. Urine analysis:    Component Value Date/Time   COLORURINE STRAW (A) 03/07/2022 0958   APPEARANCEUR CLEAR (A) 03/07/2022 0958   APPEARANCEUR Clear 12/22/2013 0943   LABSPEC 1.012 03/07/2022 0958   LABSPEC 1.022 12/22/2013 0943   PHURINE 5.0 03/07/2022 0958   GLUCOSEU NEGATIVE 03/07/2022 0958   GLUCOSEU >=500 12/22/2013 0943   HGBUR NEGATIVE 03/07/2022 0958   BILIRUBINUR NEGATIVE 03/07/2022 1610  BILIRUBINUR Negative 12/22/2013 0943   KETONESUR NEGATIVE 03/07/2022 0958   PROTEINUR NEGATIVE 03/07/2022 0958   NITRITE NEGATIVE 03/07/2022 0958   LEUKOCYTESUR NEGATIVE 03/07/2022 0958   LEUKOCYTESUR Negative 12/22/2013 0943     Radiological Exams on Admission: I have personally reviewed images No results found.  EKG: I have personally reviewed EKG: Sinus tachycardia, PVC noted, old septal injury, no acute changes  Assessment/Plan Principal Problem:   Ophthalmitis Active Problems:   Acute left flank pain   DM (diabetes mellitus) with complications (HCC)   Frontal sinus pain   HTN (hypertension)   CAD (coronary artery disease)    Assessment and Plan: * Ophthalmitis Patient with multiple medical problem but lately in good health and doing well. He denies any recent illness, denies fevers or chills, denies any open wound or known infections. However, he does endorse dysuria. Friday, 08/24/22 he presented acutely to Dr. Barbaraann Barthel office for left eye pain. He had a vitreous hemorrhage. He was seen today, 8.26.24 dx'd with endogenous opthalmitis.   Plan Per Dr. Luciana Axe - for OR for aspiration of vitreous fluid for diagnostic purposes and instillation of Vancomycin and ceftazidime.  Acute left flank pain Patient with h/o urinary retention. He was self-cathing but has been able to micturate since June. He does admit to dysuria, increased frequency. ON exam he has marked tenderness left flank to palpation and percussion. U/A pending. Suspect pyelonephritis  Plan Rocephin 1 g IV q24  CT abdomen w/o contrast  Frontal sinus pain Patient c/o severe frontal pain. He admits to copious sinus drainage but doesn't describe quality or color. NO fever or chills Admitted for opthalmitis without known  infection that may have been source. Very tender to percussion on exam.  Plan CT sinus  Rocephin 1g q24  DM (diabetes mellitus) with complications (HCC) No recent A1C in EPIC. Patient takes metformin at home  Plan Hold metformin in setting of acute illness  A1C   Sliding scale.  CAD (coronary artery disease) Patient with h/o CAD and MI. Currently without chest pain or symptoms. EKG with old septal injury w/o acute  changes.  Plan No indication for telemetry  Continue home medications.   HTN (hypertension) BP stable  Plan Continue home meds  Code Status - per wife and patient he is for CPR effort but DNI     DVT prophylaxis: SQ Heparin Code Status:  For CPR in the event of cardiac arrest but   DNI Family Communication: Wife present during interview and exam Disposition Plan: home when medically stable  Consults called: Ophthalmology - Dr. Luciana Axe  Admission status: Inpatient, Med-Surg   Illene Regulus, MD [facility] 08/27/2022, 1:27 PM

## 2022-08-27 NOTE — Assessment & Plan Note (Signed)
Patient with h/o CAD and MI. Currently without chest pain or symptoms. EKG with old septal injury w/o acute changes.  Plan No indication for telemetry  Continue home medications.

## 2022-08-27 NOTE — Assessment & Plan Note (Signed)
No recent A1C in EPIC. Patient takes metformin at home  Plan Hold metformin in setting of acute illness  A1C   Sliding scale.

## 2022-08-27 NOTE — Subjective & Objective (Signed)
Patient with multiple medical problems including DM 2, HTN, HLD, CAD, OA DM retinopathy, other ophthalmologic problems but lately in good health and doing well. He denies any recent illness, denies fevers or chills, denies any open wound or known infections. However, he does endorse dysuria. Friday, 08/24/22 he presented acutely to Dr. Ephriam Knuckles office for left eye pain. He had a vitreous hemorrhage. He was seen by Dr. Luciana Axe  today, 8.26.24, dx'd with endogenous opthalmitis. He was referred to [facility] for emergent OR intervention. [facility] called to admit patient, seek source of infectious seeding leading to opthamitis and to manage chronic medical problems.

## 2022-08-27 NOTE — H&P (Signed)
1 day history of severe pain, OS, with progressive vision loss with new onset hypopyon OS.  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   Patient denies other aches, pains, recent illnesses. Not febrile.  Patient seen in office today at 900 am, found to have new changes from mild vitreous hemorrhage 3 days prior.  Sent to ER, for blood cultures, urine cultures, and emergency vitrectomy , tap diagnostic, and injection of antibiotics OS.  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  Patient and the family understand that this is an endogenous amines and internal source of endophthalmitis and not from an exogenous source such as trauma or surgical intervention or previous or recent procedure.  Thus hospital visit with medical assistant to look for potential sources of endogenous endophthalmitis either from bacteremia transient and/or low-grade ongoing sepsis will be undertaken.  I explained to the patient and family that diagnostic tissue and therapeutic antibiotics will need to be delivered as an initial step to preserve the left eye.  Endophthalmitis of the sort tends to have a poor prognosis for visual site recovery because the typically these are highly virulent and pathogenic organisms.  Nonetheless we will proceed initially for attempted sterilization of the vitreous cavity and intraocular space with diagnostic vitreous tap, anterior chamber tap and washout, and finally installation of intravitreal antibiotics to clear the infectious process.  Impression #1 endogenous endophthalmitis left eye  2.  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  #3 diabetes mellitus.  Plan n.p.o. since last night, patient to the operating room today for diagnostic and therapeutic vitreous tap, limited vitrectomy, anterior chamber washout, anterior chamber tap, instillation intravitreal antibiotics left eye,  vancomycin 1 mg, and ceftazidime 2.25 mg.

## 2022-08-27 NOTE — Assessment & Plan Note (Signed)
Patient with multiple medical problem but lately in good health and doing well. He denies any recent illness, denies fevers or chills, denies any open wound or known infections. However, he does endorse dysuria. Friday, 08/24/22 he presented acutely to Dr. Barbaraann Barthel office for left eye pain. He had a vitreous hemorrhage. He was seen today, 8.26.24 dx'd with endogenous opthalmitis.   Plan Per Dr. Luciana Axe - for OR for aspiration of vitreous fluid for diagnostic purposes and instillation of Vancomycin and ceftazidime.

## 2022-08-28 ENCOUNTER — Encounter (HOSPITAL_COMMUNITY): Payer: Self-pay | Admitting: Ophthalmology

## 2022-08-28 DIAGNOSIS — R109 Unspecified abdominal pain: Secondary | ICD-10-CM

## 2022-08-28 DIAGNOSIS — I10 Essential (primary) hypertension: Secondary | ICD-10-CM

## 2022-08-28 DIAGNOSIS — E118 Type 2 diabetes mellitus with unspecified complications: Secondary | ICD-10-CM

## 2022-08-28 DIAGNOSIS — H109 Unspecified conjunctivitis: Secondary | ICD-10-CM | POA: Diagnosis not present

## 2022-08-28 LAB — GLUCOSE, CAPILLARY
Glucose-Capillary: 109 mg/dL — ABNORMAL HIGH (ref 70–99)
Glucose-Capillary: 111 mg/dL — ABNORMAL HIGH (ref 70–99)
Glucose-Capillary: 117 mg/dL — ABNORMAL HIGH (ref 70–99)
Glucose-Capillary: 134 mg/dL — ABNORMAL HIGH (ref 70–99)

## 2022-08-28 MED ORDER — CYCLOPENTOLATE HCL 1 % OP SOLN
1.0000 [drp] | Freq: Three times a day (TID) | OPHTHALMIC | Status: DC
  Administered 2022-08-28 – 2022-08-29 (×4): 1 [drp] via OPHTHALMIC
  Filled 2022-08-28: qty 2

## 2022-08-28 MED ORDER — SODIUM CHLORIDE 0.9 % IV SOLN
2.0000 g | Freq: Three times a day (TID) | INTRAVENOUS | Status: DC
  Administered 2022-08-28 – 2022-08-29 (×3): 2 g via INTRAVENOUS
  Filled 2022-08-28 (×5): qty 2

## 2022-08-28 MED ORDER — PREDNISOLONE ACETATE 1 % OP SUSP
1.0000 [drp] | Freq: Four times a day (QID) | OPHTHALMIC | Status: DC
  Administered 2022-08-28 – 2022-08-29 (×5): 1 [drp] via OPHTHALMIC
  Filled 2022-08-28: qty 5

## 2022-08-28 MED ORDER — GATIFLOXACIN 0.5 % OP SOLN
1.0000 [drp] | Freq: Four times a day (QID) | OPHTHALMIC | Status: DC
  Administered 2022-08-28 – 2022-08-29 (×5): 1 [drp] via OPHTHALMIC
  Filled 2022-08-28: qty 2.5

## 2022-08-28 MED ORDER — VANCOMYCIN HCL 1500 MG/300ML IV SOLN
1500.0000 mg | INTRAVENOUS | Status: DC
  Administered 2022-08-28 – 2022-08-29 (×2): 1500 mg via INTRAVENOUS
  Filled 2022-08-28 (×2): qty 300

## 2022-08-28 MED FILL — Ceftazidime For Inj 1 GM: INTRAVITREAL | Qty: 0.1 | Status: AC

## 2022-08-28 NOTE — Consult Note (Signed)
[facility]                                                                                        Patient Identification: Patient Name: Michael Humphrey MRN: [id number] Admit Date: 08/27/2022 11:14 AM Today's Date: 08/28/2022 Reason for consult: endopthalmitis  Requesting provider: Dr Blake Divine   Principal Problem:   Ophthalmitis Active Problems:   DM (diabetes mellitus) with complications (HCC)   HTN (hypertension)   Acute left flank pain   Frontal sinus pain   CAD (coronary artery disease)   Antibiotics:  Ceftriaxone 8/26-c  Lines/Hardware: Bilateral knee and hip replacement  Assessment 72 year old male with PMH as below including DM with NPDR, prior h/o cataract extraction w phaco and IO lens implantation, HTN, HLD, CAD, OA admitted with concern for   # 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 Luciana Axe: No known recent injury or trauma or surgical procedure or recent infections or being on abtx  8/26  diagnostic and therapeutic vitrectomy, removal of anterior chamber, iris membranes, anterior chamber diagnostic tap and vitreous washings plus injection of intravitreal antibiotics vancomycin and ceftazidime Low suspicion for fungal or othet Ois at this time  # E faecalis UTI - some dysuria and increased frequency, covered by Vancomycin  # DM - A1c 6.4 08/27/22  Recommendations  Will add IV Vancomycin, pharamcy to dose and IV ceftazidime as adjunctive systemic IV abtx. Intravitreal abtx are primary mode of abtx which has already got Fu 8/26 cx BG control   Post op care per ophthalmology  Following   Rest of the management as per the primary team. Please call with questions or concerns.  Thank you for the consult  __________________________________________________________________________________________________________ HPI and Hospital Course: 72 year old male  with PMH as below including DM with NPDR, HTN, HLD, CAD, OA admitted with concern for endogenous endophthalmitis of left eye by ophthalmology Dr. Luciana Axe.  Patient had 1 day history of severe pain with progressive vision loss and new onset hypopyon.  No known recent injury or trauma or surgical procedure.  No reported fevers.  He reported dysuria during admission and  increased frequency of urination to me. He has some radiation pain around the left eye, No visual problem in the rt eye. Denies any hearing difficulties, neck pain, numbness, weakness. He already feels some improvement in left eye vision after the procedure. No known immunocompromising conditions or medications. Denies smoking, alcohol and IVDU. Denies pets or being sexually active   At ED afebrile Labs remarkable for WBC 9.0 8/26 urine cx E faecalis, greater than 100,000 colonies   ROS: General- Denies fever, chills, loss of appetite and loss of weight HEENT - Denies headache,  neck pain, sinus pain Chest - Denies any chest pain, SOB or cough CVS- Denies any dizziness/lightheadedness, syncopal attacks, palpitations Abdomen- Denies any nausea, vomiting, abdominal pain, hematochezia and diarrhea Neuro - Denies any weakness, numbness, tingling sensation Psych - Denies any changes in mood irritability or depressive symptoms GU- Denies any hematuria Skin - denies any rashes/lesions MSK - denies any joint pain/swelling or restricted ROM   Past Medical History:  Diagnosis Date  Anginal pain (HCC)    Arthritis    BPH (benign prostatic hyperplasia)    CAD (coronary artery disease)    a.) LHC 08/22/2012: normal coronaries; b.) LHC 06/08/2021: EF 55-65%. 30% oLM, 100% p-mLAD, 50% o-pLAD, 60% pLAD, 30% pRCA, 75% RPDA, 75% RPAV -- med mgmt.   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   DDD (degenerative disc disease), lumbar    Diabetic retinopathy (HCC)    DISH (diffuse idiopathic skeletal hyperostosis)    Diverticulitis    Full dentures    GERD (gastroesophageal reflux disease)    Heart attack (HCC)    High cholesterol    Hordeolum internum right lower eyelid 12/09/2019   Hypertension    Incomplete right bundle branch block (RBBB)    Lumbar radiculitis    a.) s/p L4-L5 decompression   Lumbar radiculopathy    Posterior capsular opacification, 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 Janee Morn recently and sent here   Pulmonary embolism (HCC)    Skin cancer    Sleep apnea    a.) unable to tolerate nocturnal PAP therapy   Spinal stenosis of lumbar region, unspecified whether neurogenic claudication present    Statin intolerance    T2DM (type 2 diabetes mellitus) (HCC)    Tubular adenoma of colon    Past Surgical History:  Procedure Laterality Date   AMPUTATION TOE Left 01/15/2020   Procedure: AMPUTATION TOE MPJ T1;  Surgeon: Gwyneth Revels, DPM;  Location: [facility];  Service: Podiatry;  Laterality: Left;   APPLICATION OF INTRAOPERATIVE CT SCAN N/A 03/19/2022   Procedure: APPLICATION OF INTRAOPERATIVE CT SCAN;  Surgeon: Venetia Night, MD;  Location: [facility];  Service: Neurosurgery;  Laterality: N/A;   BUNIONECTOMY     CATARACT EXTRACTION W/PHACO Left 12/11/2017   Procedure: CATARACT EXTRACTION PHACO AND INTRAOCULAR LENS PLACEMENT (IOC)  LEFT DIABETIC;  Surgeon: Lockie Mola, MD;  Location: [facility];  Service: Ophthalmology;  Laterality: Left;  DIABETIC ([facility] picking pt up at 0600, needs 0630 arrival time)   CATARACT EXTRACTION W/PHACO Right 01/15/2018   Procedure: CATARACT EXTRACTION PHACO AND INTRAOCULAR LENS PLACEMENT (IOC)   RIGHT DIABETIC  leave so arrival will be around 7:45 ds;  Surgeon: Lockie Mola, MD;  Location: [facility];  Service: Ophthalmology;  Laterality: Right;  Diabetic - oral meds   CHOLECYSTECTOMY     COLONOSCOPY     COLONOSCOPY N/A 08/22/2021   Procedure: COLONOSCOPY;  Surgeon: Midge Minium, MD;  Location: [facility];  Service: Endoscopy;  Laterality: N/A;   COLONOSCOPY WITH PROPOFOL N/A 12/17/2014   Procedure: COLONOSCOPY WITH PROPOFOL;  Surgeon: Christena Deem, MD;  Location: [facility];  Service: Endoscopy;  Laterality: N/A;   correct hammertoe     FASCIECTOMY Right 10/29/2019   Procedure: PLANTAR FIBROMA RESECTION RIGHT;  Surgeon: Rosetta Posner, DPM;  Location: [facility];  Service: Podiatry;  Laterality: Right;  Diabetic - oral meds   JOINT REPLACEMENT     KNEE ARTHROSCOPY Right    LEFT HEART CATH AND CORONARY ANGIOGRAPHY N/A 06/08/2021   Procedure: LEFT HEART CATH AND CORONARY ANGIOGRAPHY;  Surgeon: Marcina Millard, MD;  Location: [facility]  INVASIVE CV LAB;  Service: Cardiovascular;  Laterality: N/A;   LEFT HEART CATH AND CORONARY ANGIOGRAPHY Left 08/22/2012   Procedure: LEFT HEART CATH AND CORONARY ANGIOGRAPHY; Location: [facility]; Surgeon: Harold Hedge, MD   LUMBAR LAMINECTOMY/ DECOMPRESSION WITH MET-RX N/A 02/18/2019   Procedure: L4-5 DECOMPRESSION;  Surgeon: Venetia Night, MD;  Location: [facility];  Service: Neurosurgery;  Laterality: N/A;   PARS PLANA VITRECTOMY Left 08/27/2022   Procedure: PARS PLANA VITRECTOMY 25 GAUGE FOR ENDOPHTHALMITIS;  Surgeon: Edmon Crape, MD;  Location: [facility] OR;  Service: Ophthalmology;  Laterality: Left;   SHOULDER ARTHROSCOPY WITH OPEN ROTATOR CUFF REPAIR AND DISTAL CLAVICLE ACROMINECTOMY Right 11/16/2021   Procedure: SHOULDER ARTHROSCOPY WITH OPEN ROTATOR CUFF REPAIR AND DISTAL CLAVICLE ACROMINECTOMY;  Surgeon: Juanell Fairly, MD;  Location: [facility];  Service: Orthopedics;  Laterality: Right;   TOTAL HIP ARTHROPLASTY  Bilateral    TOTAL KNEE ARTHROPLASTY Bilateral    TRANSFORAMINAL LUMBAR INTERBODY FUSION W/ MIS 2 LEVEL N/A 03/19/2022   Procedure: L4-S1 MINIMALLY INVASIVE (MIS) TRANSFORAMINAL LUMBAR INTERBODY FUSION (TLIF);  Surgeon: Venetia Night, MD;  Location: [facility];  Service: Neurosurgery;  Laterality: N/A;     Scheduled Meds:  carvedilol  12.5 mg Oral BID WC   feeding supplement  237 mL Oral BID BM   finasteride  5 mg Oral Daily   gabapentin  600 mg Oral BID   heparin  5,000 Units Subcutaneous Q8H   insulin aspart  0-15 Units Subcutaneous TID WC   ketorolac  15 mg Intravenous Q6H   lisinopril  2.5 mg Oral Daily   methocarbamol  750 mg Oral TID   rosuvastatin  5 mg Oral Daily   tamsulosin  0.4 mg Oral Daily   torsemide  20 mg Oral Daily   Continuous Infusions:  sodium chloride 50 mL/hr at 08/27/22 1240   cefTRIAXone (ROCEPHIN)  IV 1 g (08/27/22 2017)   PRN Meds:.acetaminophen, senna, traZODone  Allergies  Allergen Reactions   Clonidine Derivatives     Hallucinations    Fioricet [Butalbital-Apap-Caffeine]     hallucination   Hydrocodone-Acetaminophen     hallucinations   Mobic [Meloxicam]     hallucinations   Norvasc [Amlodipine Besylate]     hallucinations   Statins     Muscle cramps   Tramadol Itching    Social History   Socioeconomic History   Marital status: Married    Spouse name: Elease Hashimoto   Number of children: Not on file   Years of education: Not on file   Highest education level: Not on file  Occupational History   Not on file  Tobacco Use   Smoking status: Never   Smokeless tobacco: Former    Types: Chew    Quit date: 02/08/1978   Tobacco comments:    No vap, E cig, chew  Vaping Use   Vaping status: Never Used  Substance and Sexual Activity   Alcohol use: Not Currently   Drug use: No   Sexual activity: Not on file  Other Topics Concern   Not on file  Social History Narrative   Not on file   Social Determinants of Health   Financial  Resource Strain: Low Risk  (04/02/2022)   Received from [facility]   Overall Financial Resource Strain (CARDIA)    Difficulty of Paying Living Expenses: Not hard at all  Food Insecurity: No Food Insecurity (08/27/2022)   Hunger Vital Sign    Worried About Running Out of Food in the Last Year:  Never true    Ran Out of Food in the Last Year: Never true  Transportation Needs: No Transportation Needs (08/27/2022)   PRAPARE - Administrator, Civil Service (Medical): No    Lack of Transportation (Non-Medical): No  Physical Activity: Not on file  Stress: Not on file  Social Connections: Not on file  Intimate Partner Violence: Not At Risk (08/27/2022)   Humiliation, Afraid, Rape, and Kick questionnaire    Fear of Current or Ex-Partner: No    Emotionally Abused: No    Physically Abused: No    Sexually Abused: No   Family History  Problem Relation Age of Onset   Clotting disorder Father    Hypertension Father      Vitals BP 106/71 (BP Location: Right Arm)   Pulse 78   Temp 98.2 F (36.8 C)   Resp 17   Ht 5\' 9"  (1.753 m)   Wt 88.5 kg   SpO2 100%   BMI 28.80 kg/m    Physical Exam Constitutional:  elderly male sitting in the bed, NAD     Comments:   Cardiovascular:     Rate and Rhythm: Normal rate and regular rhythm.     Heart sounds: s1s2  Pulmonary:     Effort: Pulmonary effort is normal.     Comments: Normal lung sounds  Abdominal:     Palpations: Abdomen is soft.     Tenderness: non distended and nontender  Musculoskeletal:        General: No swelling or tenderness in peripheral joints   Skin:    Comments: No rashes   Neurological:     General: awake, alert and oriented, following commands   Psychiatric:        Mood and Affect: Mood normal.    Pertinent Microbiology Results for orders placed or performed during the hospital encounter of 08/27/22  Aerobic/Anaerobic Culture w Gram Stain (surgical/deep wound)     Status: None  (Preliminary result)   Collection Time: 08/27/22  1:40 PM   Specimen: Path fluid; Body Fluid  Result Value Ref Range Status   Specimen Description FLUID  Final   Special Requests LEFT EYE FLUID  Final   Gram Stain   Final    RARE WBC PRESENT, PREDOMINANTLY PMN NO ORGANISMS SEEN    Culture   Final    NO GROWTH < 24 HOURS Performed at [facility], [address] [place], [place] [address]    Report Status PENDING  Incomplete  Aerobic/Anaerobic Culture w Gram Stain (surgical/deep wound)     Status: None (Preliminary result)   Collection Time: 08/27/22  1:47 PM   Specimen: Path fluid; Body Fluid  Result Value Ref Range Status   Specimen Description FLUID  Final   Special Requests LEFT EYE WASHING  Final   Gram Stain NO WBC SEEN NO ORGANISMS SEEN   Final   Culture   Final    NO GROWTH < 24 HOURS Performed at [facility], [address], [place], [place] [address]    Report Status PENDING  Incomplete  Urine Culture     Status: Abnormal (Preliminary result)   Collection Time: 08/27/22  2:20 PM   Specimen: Urine, Clean Catch  Result Value Ref Range Status   Specimen Description URINE, CLEAN CATCH  Final   Special Requests NONE  Final   Culture (A)  Final    >=100,000 COLONIES/mL ENTEROCOCCUS FAECALIS SUSCEPTIBILITIES TO FOLLOW Performed at [facility],  [address], [place], [place] [address]    Report Status PENDING  Incomplete   Pertinent Lab seen by me:    Latest Ref Rng & Units 08/27/2022    3:47 PM 08/27/2022   11:25 AM 03/07/2022    9:54 AM  CBC  WBC 4.0 - 10.5 K/uL 9.0  9.7  4.7   Hemoglobin 13.0 - 17.0 g/dL 13.0  86.5  78.4   Hematocrit 39.0 - 52.0 % 38.9  42.6  41.5   Platelets 150 - 400 K/uL 209  241  155       Latest Ref Rng & Units 08/27/2022    3:47 PM 08/27/2022   11:25 AM 03/07/2022    9:54 AM  CMP  Glucose 70 - 99 mg/dL 696  295  284   BUN 8 - 23 mg/dL 23  23  16    Creatinine 0.61 - 1.24 mg/dL 1.32  4.40  1.02   Sodium 135 - 145  mmol/L 138  136  136   Potassium 3.5 - 5.1 mmol/L 3.6  3.6  4.0   Chloride 98 - 111 mmol/L 105  103  104   CO2 22 - 32 mmol/L 21  21  25    Calcium 8.9 - 10.3 mg/dL 8.7  9.3  9.1   Total Protein 6.5 - 8.1 g/dL 6.5     Total Bilirubin 0.3 - 1.2 mg/dL 0.8     Alkaline Phos 38 - 126 U/L 125     AST 15 - 41 U/L 18     ALT 0 - 44 U/L 18        Pertinent Imagings/Other Imagings Plain films and CT images have been personally visualized and interpreted; radiology reports have been reviewed. Decision making incorporated into the Impression / Recommendations.  CT HEAD WO CONTRAST  Result Date: 08/27/2022 CLINICAL DATA:  Frontal sinus pain EXAM: CT HEAD WITHOUT CONTRAST TECHNIQUE: Contiguous axial images were obtained from the base of the skull through the vertex without intravenous contrast. RADIATION DOSE REDUCTION: This exam was performed according to the departmental dose-optimization program which includes automated exposure control, adjustment of the mA and/or kV according to patient size and/or use of iterative reconstruction technique. COMPARISON:  02/26/2018 FINDINGS: Brain: There is no mass, hemorrhage or extra-axial collection. The size and configuration of the ventricles and extra-axial CSF spaces are normal. There is hypoattenuation of the white matter, most commonly indicating chronic small vessel disease. Vascular: No abnormal hyperdensity of the major intracranial arteries or dural venous sinuses. No intracranial atherosclerosis. Skull: The visualized skull base, calvarium and extracranial soft tissues are normal. Sinuses/Orbits: There is air within the anterior chamber of the left globe. The paranasal sinuses are clear. IMPRESSION: 1. No acute intracranial abnormality. 2. Air within the anterior chamber of the left globe, of uncertain etiology, possibly iatrogenic. Electronically Signed   By: Deatra Robinson M.D.   On: 08/27/2022 22:46   CT ABDOMEN WO CONTRAST  Result Date:  08/27/2022 CLINICAL DATA:  Left upper quadrant and left flank pain EXAM: CT ABDOMEN WITHOUT CONTRAST TECHNIQUE: Multidetector CT imaging of the abdomen was performed following the standard protocol without IV contrast. Evaluation of the soft tissues, solid viscera, and vascular structures is limited without IV contrast. RADIATION DOSE REDUCTION: This exam was performed according to the departmental dose-optimization program which includes automated exposure control, adjustment of the mA and/or kV according to patient size and/or use of iterative reconstruction technique. COMPARISON:  02/27/2018 FINDINGS: Lower chest: No acute pleural or  parenchymal lung disease. Small pericardial effusion, measuring up to 1.2 cm in maximal thickness. Diffuse atherosclerosis of the aorta and coronary vasculature. Hepatobiliary: Cholecystectomy. Unremarkable unenhanced appearance of the liver. Pancreas: Unremarkable unenhanced appearance. Spleen: Unremarkable unenhanced appearance. Adrenals/Urinary Tract: Punctate less than 2 mm nonobstructing calculi lower pole left kidney reference image 44/2. No right-sided calculi. No obstructive uropathy within either kidney. Unremarkable unenhanced appearance of the renal parenchyma. The adrenals are unremarkable. Stomach/Bowel: No bowel obstruction or ileus. Diverticulosis of the descending colon without evidence of acute diverticulitis. No bowel wall thickening or inflammatory change. Vascular/Lymphatic: Atherosclerosis of the abdominal aorta. No pathologic adenopathy. Other: No free fluid or free intraperitoneal gas. No abdominal wall hernia. Musculoskeletal: Postsurgical changes from L4-5 and L5-S1 discectomy and posterior fusion. 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 No acute bony abnormalities. Reconstructed images demonstrate no additional findings. IMPRESSION: 1. Punctate less than 2 mm nonobstructing left renal calculi. No obstructive uropathy within either kidney.  2. Small pericardial effusion. 3. Distal colonic diverticulosis without diverticulitis. 4. Aortic Atherosclerosis (ICD10-I70.0). Coronary artery atherosclerosis. Electronically Signed   By: Sharlet Salina M.D.   On: 08/27/2022 21:39    I have personally spent 85 minutes involved in face-to-face and non-face-to-face activities for this patient on the day of the visit. Professional time spent includes the following activities: Preparing to see the patient (review of tests), Obtaining and/or reviewing separately obtained history (admission/discharge record), Performing a medically appropriate examination and/or evaluation , Ordering medications/tests/procedures, referring and communicating with other health care professionals, Documenting clinical information in the EMR, Independently interpreting results (not separately reported), Communicating results to the patient/family/caregiver, Counseling and educating the patient/family/caregiver and Care coordination (not separately reported).  Electronically signed by:   Plan d/w requesting provider as well as ID pharm D  Note: This document was prepared using dragon voice recognition software and may include unintentional dictation errors.   Odette Fraction, MD Infectious Disease Physician [facility] Pager: [phone]

## 2022-08-28 NOTE — Hospital Course (Signed)
08-28-2022.  Ophthalmology examination postop 0840  Patient sitting at bedside "feeling great".  Vision left eye hand motion in a darkly illuminated room.  Examination anterior chamber formed, iris details visible thus no Reformation of hypopyon today.  Diffuse red red reflex posteriorly with indirect ophthalmoscope.  "I want to go home"  I informed the patient that the endogenous endophthalmitis bacteremia or sepsis.  The internal medicine doctors and hospitals are attempting to discern potential sources of the bacteremia that led to this endogenous endophthalmitis in the absence of direct trauma and/or surgical procedure to the left eye and recent months.  Will start today topical antibiotics and Pred forte.  Impression #1 endogenous endophthalmitis improving, lab results pending from cultures  2.  Etiology of endogenous endophthalmitis-pending  Plan   start gatifloxacin 1 drop left eye 4 times daily   Start Pred forte 1 drop left eye 4 times daily Start Cyclogyl 1% left eye 3 times daily for cycloplegic benefit of pain control for ocular inflammation inducing muscular pain internally in the left eye.  Will follow-up in office upon discharge.  September 3, on September 4 or September 5 continue topical medications until patient is seen in the office  Patient should be discharged on the above 3 medications topically to the left eye  Note by Dr. Fawn Kirk, MD

## 2022-08-28 NOTE — Progress Notes (Signed)
[facility]                                                                               Michael Humphrey, is a 72 y.o. male, DOB - Nov 19, 1950, ZOX:[id number] Admit date - 08/27/2022    Outpatient Primary MD for the patient is Michael Regulus, MD  LOS - 1  days    Brief summary   72 year old male DM 2, HTN, HLD, CAD, OA DM retinopathy presented acutely to Dr. Ephriam Humphrey office for left eye pain. He was found to have  vitreous hemorrhage and endogenous opthalmitis. He was referred to [facility] for emergent OR intervention.  He underwent diagnostic and therapeutic vitrectomy, and removal of anterior chamber, iris membranes,  anterior chamber diagnostic tap and vitreous washings diagnostic tap and injection of intravitreal antibiotics.  He also reports dysuria, urine cultures were sent and pending.  He was empirically started on IV rocephin.  ID consulted to see if antibiotics need to be completed.    Assessment & Plan    Assessment and Plan: * Endophthalmitis and vitreous hemorrhage:  He underwent diagnostic and therapeutic vitrectomy, and removal of anterior chamber, iris membranes,  anterior chamber diagnostic tap and vitreous washings diagnostic tap and injection of intravitreal antibiotics by Michael Humphrey on 08/27/22 He also reports dysuria, urine cultures were sent and pending.  He was empirically started on IV rocephin.  ID consulted to see if antibiotics need to be completed.   Acute left flank pain Patient with h/o urinary retention. He was self-cathing but has been able to micturate since June.he reports dysuria.  Urine cultures done.  CT abd and pelvis unremarkable.  Empirically on IV rocephin.   Frontal sinus pain Pain control.  Monitor.   DM (diabetes mellitus) with complications (HCC) CBG (last 3)  Recent Labs    08/27/22 2112 08/28/22 0830 08/28/22 1233  GLUCAP 107* 109* 111*   Check A1c.  Continue with SSI.   CAD (coronary artery disease) Patient  with h/o CAD and MI. Currently without chest pain or symptoms. EKG with old septal injury w/o acute changes. Resume home meds.   HTN (hypertension) Well controlled.        Estimated body mass index is 28.8 kg/m as calculated from the following:   Height as of this encounter: 5\' 9"  (1.753 m).   Weight as of this encounter: 88.5 kg.  Code Status: full code.  DVT Prophylaxis:  heparin injection 5,000 Units Start: 08/27/22 1600   Level of Care: Level of care: Med-Surg Family Communication: Updated patient's family at bedside.   Procedures:  Vitrectomy   Consultants:   Ophthalmology.   Antimicrobials:   Anti-infectives (From admission, onward)    Start     Dose/Rate Route Frequency Ordered Stop   08/27/22 2200  cefTRIAXone (ROCEPHIN) 1 g in sodium chloride 0.9 % 100 mL IVPB        1 g 200 mL/hr over 30 Minutes Intravenous Every 24 hours 08/27/22 1315     08/27/22 1430  cefTAZidime (FORTAZ) 1 g in sodium chloride 0.9 % 100 mL IVPB        1 g 200 mL/hr over 30 Minutes Intravenous  Once 08/27/22 1419 08/27/22 1515   08/27/22 1300  vancomycin (VANCOCIN) intravitreal injection 1 mg        1 mg Intravitreal To Surgery 08/27/22 1253 08/27/22 1411   08/27/22 1300  cefTAZidime (FORTAZ) intravitreal injection 2.25 mg        2.25 mg Intravitreal To Surgery 08/27/22 1253 08/27/22 1410        Medications  Scheduled Meds:  carvedilol  12.5 mg Oral BID WC   feeding supplement  237 mL Oral BID BM   finasteride  5 mg Oral Daily   gabapentin  600 mg Oral BID   heparin  5,000 Units Subcutaneous Q8H   insulin aspart  0-15 Units Subcutaneous TID WC   ketorolac  15 mg Intravenous Q6H   lisinopril  2.5 mg Oral Daily   methocarbamol  750 mg Oral TID   rosuvastatin  5 mg Oral Daily   tamsulosin  0.4 mg Oral Daily   torsemide  20 mg Oral Daily   Continuous Infusions:  sodium chloride 50 mL/hr at 08/27/22 1240   cefTRIAXone (ROCEPHIN)  IV 1 g (08/27/22 2017)   PRN  Meds:.acetaminophen, senna, traZODone    Subjective:   Michael Humphrey was seen and examined today. Wants to go home.   Objective:   Vitals:   08/28/22 0043 08/28/22 0609 08/28/22 0829 08/28/22 1235  BP: 105/71 101/66 97/67 106/71  Pulse: 83 83 78   Resp: 16 17 17 17   Temp: 98 F (36.7 C) 98 F (36.7 C) (!) 97.5 F (36.4 C) 98.2 F (36.8 C)  TempSrc:   Oral   SpO2: 98% 98% 100% 100%  Weight:      Height:        Intake/Output Summary (Last 24 hours) at 08/28/2022 1321 Last data filed at 08/28/2022 1230 Gross per 24 hour  Intake 1800 ml  Output 700 ml  Net 1100 ml   Filed Weights   08/27/22 1135  Weight: 88.5 kg     Exam General: Alert and oriented x 3, NAD, left eye closed shut, slightly edematous.  Cardiovascular: S1 S2 auscultated, no murmurs, RRR Respiratory: Clear to auscultation bilaterally, no wheezing, rales or rhonchi Gastrointestinal: Soft, nontender, nondistended, + bowel sounds Ext: no pedal edema bilaterally Neuro: AAOx3, grossly non focal.  Skin: No rashes Psych: Normal affect and demeanor, alert and oriented x3    Data Reviewed:  I have personally reviewed following labs and imaging studies   CBC Lab Results  Component Value Date   WBC 9.0 08/27/2022   RBC 4.07 (L) 08/27/2022   HGB 12.9 (L) 08/27/2022   HCT 38.9 (L) 08/27/2022   MCV 95.6 08/27/2022   MCH 31.7 08/27/2022   PLT 209 08/27/2022   MCHC 33.2 08/27/2022   RDW 13.8 08/27/2022   LYMPHSABS 1.1 08/27/2022   MONOABS 0.4 08/27/2022   EOSABS 0.0 08/27/2022   BASOSABS 0.0 08/27/2022     Last metabolic panel Lab Results  Component Value Date   NA 138 08/27/2022   K 3.6 08/27/2022   CL 105 08/27/2022   CO2 21 (L) 08/27/2022   BUN 23 08/27/2022   CREATININE 1.19 08/27/2022   GLUCOSE 116 (H) 08/27/2022   GFRNONAA >60 08/27/2022   GFRAA >60 02/10/2019   CALCIUM 8.7 (L) 08/27/2022   PROT 6.5 08/27/2022   ALBUMIN 3.3 (L) 08/27/2022   BILITOT 0.8 08/27/2022   ALKPHOS 125  08/27/2022   AST 18 08/27/2022   ALT 18 08/27/2022   ANIONGAP 12 08/27/2022  CBG (last 3)  Recent Labs    08/27/22 2112 08/28/22 0830 08/28/22 1233  GLUCAP 107* 109* 111*      Coagulation Profile: No results for input(s): "INR", "PROTIME" in the last 168 hours.   Radiology Studies: CT HEAD WO CONTRAST  Result Date: 08/27/2022 CLINICAL DATA:  Frontal sinus pain EXAM: CT HEAD WITHOUT CONTRAST TECHNIQUE: Contiguous axial images were obtained from the base of the skull through the vertex without intravenous contrast. RADIATION DOSE REDUCTION: This exam was performed according to the departmental dose-optimization program which includes automated exposure control, adjustment of the mA and/or kV according to patient size and/or use of iterative reconstruction technique. COMPARISON:  02/26/2018 FINDINGS: Brain: There is no mass, hemorrhage or extra-axial collection. The size and configuration of the ventricles and extra-axial CSF spaces are normal. There is hypoattenuation of the white matter, most commonly indicating chronic small vessel disease. Vascular: No abnormal hyperdensity of the major intracranial arteries or dural venous sinuses. No intracranial atherosclerosis. Skull: The visualized skull base, calvarium and extracranial soft tissues are normal. Sinuses/Orbits: There is air within the anterior chamber of the left globe. The paranasal sinuses are clear. IMPRESSION: 1. No acute intracranial abnormality. 2. Air within the anterior chamber of the left globe, of uncertain etiology, possibly iatrogenic. Electronically Signed   By: Deatra Robinson M.D.   On: 08/27/2022 22:46   CT ABDOMEN WO CONTRAST  Result Date: 08/27/2022 CLINICAL DATA:  Left upper quadrant and left flank pain EXAM: CT ABDOMEN WITHOUT CONTRAST TECHNIQUE: Multidetector CT imaging of the abdomen was performed following the standard protocol without IV contrast. Evaluation of the soft tissues, solid viscera, and vascular  structures is limited without IV contrast. RADIATION DOSE REDUCTION: This exam was performed according to the departmental dose-optimization program which includes automated exposure control, adjustment of the mA and/or kV according to patient size and/or use of iterative reconstruction technique. COMPARISON:  02/27/2018 FINDINGS: Lower chest: No acute pleural or parenchymal lung disease. Small pericardial effusion, measuring up to 1.2 cm in maximal thickness. Diffuse atherosclerosis of the aorta and coronary vasculature. Hepatobiliary: Cholecystectomy. Unremarkable unenhanced appearance of the liver. Pancreas: Unremarkable unenhanced appearance. Spleen: Unremarkable unenhanced appearance. Adrenals/Urinary Tract: Punctate less than 2 mm nonobstructing calculi lower pole left kidney reference image 44/2. No right-sided calculi. No obstructive uropathy within either kidney. Unremarkable unenhanced appearance of the renal parenchyma. The adrenals are unremarkable. Stomach/Bowel: No bowel obstruction or ileus. Diverticulosis of the descending colon without evidence of acute diverticulitis. No bowel wall thickening or inflammatory change. Vascular/Lymphatic: Atherosclerosis of the abdominal aorta. No pathologic adenopathy. Other: No free fluid or free intraperitoneal gas. No abdominal wall hernia. Musculoskeletal: Postsurgical changes from L4-5 and L5-S1 discectomy and posterior fusion. 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 No acute bony abnormalities. Reconstructed images demonstrate no additional findings. IMPRESSION: 1. Punctate less than 2 mm nonobstructing left renal calculi. No obstructive uropathy within either kidney. 2. Small pericardial effusion. 3. Distal colonic diverticulosis without diverticulitis. 4. Aortic Atherosclerosis (ICD10-I70.0). Coronary artery atherosclerosis. Electronically Signed   By: Sharlet Salina M.D.   On: 08/27/2022 21:39       Kathlen Mody M.D. [facility] 08/28/2022, 1:21 PM  Available via Epic secure chat 7am-7pm After 7 pm, please refer to night coverage provider listed on amion.

## 2022-08-28 NOTE — Anesthesia Postprocedure Evaluation (Signed)
Anesthesia Post Note  Patient: Michael Humphrey  Procedure(s) Performed: PARS PLANA VITRECTOMY 25 GAUGE FOR ENDOPHTHALMITIS (Left)     Patient location during evaluation: PACU Anesthesia Type: General Level of consciousness: awake and alert Pain management: pain level controlled Vital Signs Assessment: post-procedure vital signs reviewed and stable Respiratory status: spontaneous breathing, nonlabored ventilation, respiratory function stable and patient connected to nasal cannula oxygen Cardiovascular status: blood pressure returned to baseline and stable Postop Assessment: no apparent nausea or vomiting Anesthetic complications: no   No notable events documented.  Last Vitals:  Vitals:   08/28/22 0043 08/28/22 0609  BP: 105/71 101/66  Pulse: 83 83  Resp: 16 17  Temp: 36.7 C 36.7 C  SpO2: 98% 98%    Last Pain:  Vitals:   08/28/22 0113  TempSrc:   PainSc: 5                  Collene Schlichter

## 2022-08-28 NOTE — Op Note (Signed)
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  Removal of anterior chamber, iris membranes #3 anterior chamber diagnostic tap #4 vitreous washings diagnostic tap 5. injection of intravitreal antibiotics vancomycin 1.0 mg / 0.1 cc number 6 injection intravitreal antibiotics ceftazidime 2.25 mg / 0.1 cc #7 straight cath with urinalysis and urine culture at completion of ocular procedure   Surgeon Fawn Kirk, MD   Anesthesia local retrobulbar block with monitored anesthesia control   Indication for procedure profound visual loss on the basis of endogenous endophthalmitis in the absence of recent trauma or surgery to the left eye    Patient and family preoperatively understand this is an attempt to salvage the globe of the left eye.  This is also an attempt to obtain diagnostic data from potential cultures from the anterior chamber in the vitreous cavity so as to determine the nature of the underlying infection that is not related to surgery, trauma or other procedure to the left eye but likely endogenous.  They understand that the outcome visually long-term is based upon the type of organism and any secondary pathogenic or inflammatory events that occur inside the eye from the original infection.  Patient and the family understand that the risk of anesthesia which is minor but still including the risk of death is low but nonetheless other risks to the eye including but not exclusive of hemorrhage, infection, scarring, efferent of the surgery, no change of vision, loss of vision and progressive disease despite intervention.  After appropriate sign consent was obtained patient taken to the operating room.  In the operating room proper monitoring was followed by mild sedation.  Vertical timeout was carried out with operating staff and the surgeon identified  the left eye.  Thereafter under mild sedation a mixture of 2% Xylocaine and ropivacaine 0.5%, 50-50, 5 cc injected retrobulbar fashion atraumatically an additional 3 cc injected laterally in the fashion of modified van lint with the Lashonta Pilling modification.  Thereafter the left IN periocular region was sterilely prepped and draped usual fashion.  Lid speculum applied.  Poor visualization of the posterior segment was present.   Initially a 1 cc syringe with a 30-gauge needle was placed into the anterior chamber and active purulent debris was removed and immediately sent for aerobic and anaerobic media cultures.  To reform the anterior chamber, trocar 25-gauge was placed into the anterior chamber and a paracentesis fashion.  Topical tetracaine and been applied previously.  Infusion pressures were remained on low 20 mm initially.  Secondary trocar was then placed in the supra nasal quadrant.  This allowed passage of the vitrectomy instrumentation.  Using vitrectomy as instrumentation was able to engage a dense pupillary fiber membrane, hypopyon type material, overlying the pupil on this was engaged with active suction and there after removed with vitrectomy instrumentation on active cut.  At this time this allowing for superotemporal posterior trocar to be placed also in the superonasal quadrant 3 and half millimeters posterior to the limbus.  This allowed for safe placement of the vitrectomy instrumentation immediately behind the lens which now had clarity.  Once the vitrectomy instrument was immediately behind the lens vitrectomy was begun.  Fluffy white purulent type material was removed.  No attempt was made to go out to the periphery.  Attempted visualization with mild attachment was poor but feasible to maintain mid vitreous visualization with  a light pipe in the superotemporal quadrant place, and thus no further peripheral vitrectomy was carried out.  Adequate central core vitrectomy was then carried out the  anterior vitreous and this would allow subsequent placement of intravitreal antibiotics. The posterior sclerotomies were removed.  The infusion was removed.  Small amount of Viscoat and air filtered was injected into the anterior chamber to maintain the anterior chamber formed. At this time the operating room been waiting some 40 minutes for pharmacy which have been contacted personally by Dr. Luciana Axe for formulation of the intravitreal antibiotics 1 hour before, result in a delay of 15 minutes in the operating room without consequence to the eye waiting for the intravitreal antibiotics to arrive.  At this time they did arrive using 1 cc syringes, vancomycin 1.0 mg 0.1 cc volume was injected into the vitreous cavity.  No residual was placed in the subconjunctival space.  Thereafter ceftazidime volume 0.1 cc concentration 2.25 mg was injected the vitreous cavity and the remnants of this were injected in a subconjunctival space,  At this time, procedure concluded and sterile patch and function were applied to the left eye.  Patient taken the PACU in good stable condition after Toller procedure well.  Specimens were vitreous washings and as well as anterior chamber tap with direct instillation into transport media for aerobic and anaerobic.

## 2022-08-28 NOTE — Progress Notes (Signed)
Pharmacy Antibiotic Note  Michael Humphrey is a 72 y.o. male admitted on 08/27/2022 with L-eye endophthalmitis with cultures and intravitreal Vanc + Ceftazidime given in the OR yesterday.  Pharmacy has been consulted for Vancomycin and Ceftazidime intravenous dosing.  Plan: - Start Vancomycin 1500 mg IV every 24 hours (eAUC 449, SCr 1.19, Vd 0.72) - Start Ceftazidime 2g IV every 8 hours - Will continue to follow renal function, culture results, LOT, and antibiotic de-escalation plans   Height: 5\' 9"  (175.3 cm) Weight: 88.5 kg (195 lb) IBW/kg (Calculated) : 70.7  Temp (24hrs), Avg:97.9 F (36.6 C), Min:97.5 F (36.4 C), Max:98.2 F (36.8 C)  Recent Labs  Lab 08/27/22 1125 08/27/22 1547  WBC 9.7 9.0  CREATININE 1.29* 1.19    Estimated Creatinine Clearance: 63.6 mL/min (by C-G formula based on SCr of 1.19 mg/dL).    Allergies  Allergen Reactions   Clonidine Derivatives     Hallucinations    Fioricet [Butalbital-Apap-Caffeine]     hallucination   Hydrocodone-Acetaminophen     hallucinations   Mobic [Meloxicam]     hallucinations   Norvasc [Amlodipine Besylate]     hallucinations   Statins     Muscle cramps   Tramadol Itching    Antimicrobials this admission: Vanc + Ceftazidime intravitreal x 1 on 8/26 Vanc 8/27 >> Ceftazidime 8/27 >>  Microbiology results: 8/26 L-eye fluid >> 8/26 UCx >> 100k E faecalis  Thank you for allowing pharmacy to be a part of this patient's care.  Georgina Pillion, PharmD, BCPS, BCIDP Infectious Diseases Clinical Pharmacist 08/28/2022 3:28 PM   **Pharmacist phone directory can now be found on [email or web address] ([email or web address]).  Listed under [facility].

## 2022-08-29 ENCOUNTER — Other Ambulatory Visit (HOSPITAL_COMMUNITY): Payer: Self-pay

## 2022-08-29 DIAGNOSIS — H109 Unspecified conjunctivitis: Secondary | ICD-10-CM | POA: Diagnosis not present

## 2022-08-29 LAB — CBC
HCT: 36.5 % — ABNORMAL LOW (ref 39.0–52.0)
Hemoglobin: 12.5 g/dL — ABNORMAL LOW (ref 13.0–17.0)
MCH: 32.8 pg (ref 26.0–34.0)
MCHC: 34.2 g/dL (ref 30.0–36.0)
MCV: 95.8 fL (ref 80.0–100.0)
Platelets: 228 10*3/uL (ref 150–400)
RBC: 3.81 MIL/uL — ABNORMAL LOW (ref 4.22–5.81)
RDW: 13.9 % (ref 11.5–15.5)
WBC: 8.5 10*3/uL (ref 4.0–10.5)
nRBC: 0 % (ref 0.0–0.2)

## 2022-08-29 LAB — GLUCOSE, CAPILLARY
Glucose-Capillary: 108 mg/dL — ABNORMAL HIGH (ref 70–99)
Glucose-Capillary: 160 mg/dL — ABNORMAL HIGH (ref 70–99)
Glucose-Capillary: 167 mg/dL — ABNORMAL HIGH (ref 70–99)

## 2022-08-29 LAB — BASIC METABOLIC PANEL
Anion gap: 9 (ref 5–15)
BUN: 40 mg/dL — ABNORMAL HIGH (ref 8–23)
CO2: 25 mmol/L (ref 22–32)
Calcium: 8.6 mg/dL — ABNORMAL LOW (ref 8.9–10.3)
Chloride: 103 mmol/L (ref 98–111)
Creatinine, Ser: 1.27 mg/dL — ABNORMAL HIGH (ref 0.61–1.24)
GFR, Estimated: >60 mL/min (ref >60)
Glucose, Bld: 161 mg/dL — ABNORMAL HIGH (ref 70–99)
Potassium: 3.6 mmol/L (ref 3.5–5.1)
Sodium: 137 mmol/L (ref 135–145)

## 2022-08-29 LAB — URINE CULTURE: Culture: >=100000 — AB

## 2022-08-29 MED ORDER — CYCLOPENTOLATE HCL 1 % OP SOLN: 1.0000 [drp] | Freq: Three times a day (TID) | OPHTHALMIC | 0 refills | Status: AC

## 2022-08-29 MED ORDER — CYCLOPENTOLATE HCL 1 % OP SOLN
1.0000 [drp] | Freq: Three times a day (TID) | OPHTHALMIC | 0 refills | Status: DC
  Filled 2022-08-29: qty 1.1, 8d supply, fill #0

## 2022-08-29 MED ORDER — CIPROFLOXACIN HCL 500 MG PO TABS
500.0000 mg | ORAL_TABLET | Freq: Two times a day (BID) | ORAL | 0 refills | Status: AC
  Filled 2022-08-29: qty 42, 21d supply, fill #0

## 2022-08-29 MED ORDER — PREDNISOLONE ACETATE 1 % OP SUSP
1.0000 [drp] | Freq: Four times a day (QID) | OPHTHALMIC | 0 refills | Status: AC
  Filled 2022-08-29: qty 5, 25d supply, fill #0

## 2022-08-29 MED ORDER — GATIFLOXACIN 0.5 % OP SOLN
1.0000 [drp] | Freq: Four times a day (QID) | OPHTHALMIC | 0 refills | Status: DC
  Filled 2022-08-29: qty 1.4, 7d supply, fill #0

## 2022-08-29 MED ORDER — GATIFLOXACIN 0.5 % OP SOLN: 1.0000 [drp] | Freq: Four times a day (QID) | OPHTHALMIC | 0 refills | Status: AC

## 2022-08-29 MED ORDER — LINEZOLID 600 MG PO TABS
600.0000 mg | ORAL_TABLET | Freq: Two times a day (BID) | ORAL | 0 refills | Status: AC
  Filled 2022-08-29: qty 42, 21d supply, fill #0

## 2022-08-29 NOTE — Progress Notes (Signed)
Cyclodryl and gatifloxacin eye drops prescriptions have been sent to his local pharmacy. They will order the med and it will come tomorrow. Pt is aware.  Ulyses Southward, PharmD, BCIDP, AAHIVP, CPP Infectious Disease Pharmacist 08/29/2022 5:32 PM

## 2022-08-29 NOTE — Discharge Summary (Signed)
Physician Discharge Summary  Nikalas Chimento WGN:[id number] DOB: Apr 22, 1950 DOA: 08/27/2022  PCP: Lauro Regulus, MD  Admit date: 08/27/2022 Discharge date: 08/29/2022  Admitted From: home Disposition:  home  Recommendations for Outpatient Follow-up:  Follow up with Dr Luciana Axe in 5 days as scheduled  Home Health: none Equipment/Devices: none  Discharge Condition: stable CODE STATUS: Full code  HPI: Per admitting MD, Patient with multiple medical problems including DM 2, HTN, HLD, CAD, OA DM retinopathy, other ophthalmologic problems but lately in good health and doing well. He denies any recent illness, denies fevers or chills, denies any open wound or known infections. However, he does endorse dysuria. Friday, 08/24/22 he presented acutely to Dr. Ephriam Knuckles office for left eye pain. He had a vitreous hemorrhage. He was seen by Dr. Luciana Axe  today, 8.26.24, dx'd with endogenous opthalmitis. He was referred to [facility] for emergent OR intervention. [facility] called to admit patient, seek source of infectious seeding leading to opthamitis and to manage chronic medical problems.    Hospital Course / Discharge diagnoses: Principal Problem:   Ophthalmitis Active Problems:   Acute left flank pain   DM (diabetes mellitus) with complications (HCC)   Frontal sinus pain   HTN (hypertension)   CAD (coronary artery disease)  Principal problem Endophthalmitis and vitreous hemorrhage - He underwent diagnostic and therapeutic vitrectomy, and removal of anterior chamber, iris membranes,  anterior chamber diagnostic tap and vitreous washings diagnostic tap and injection of intravitreal antibiotics by [facility] on 08/27/22.  Discussed with Dr. Luciana Axe over the phone, from his standpoint patient can be discharged with outpatient follow-up.  ID was also consulted, operative cultures have remained negative.  Will be placed on linezolid and ciprofloxacin for 3 weeks and discharged home in stable  condition  Active problems Acute left flank pain, UTI  - Patient with h/o urinary retention. He was self-cathing but has been able to micturate since June. He reports dysuria.  Urine cultures showed pansensitive Enterococcus. DM (diabetes mellitus) with complications (HCC) - outpatient management CAD (coronary artery disease) - Patient with h/o CAD and MI. Currently without chest pain or symptoms. EKG with old septal injury w/o acute changes. Resume home meds.  HTN (hypertension) - Well controlled.   Sepsis ruled out   Discharge Instructions   Allergies as of 08/29/2022       Reactions   Clonidine Derivatives    Hallucinations   Fioricet [butalbital-apap-caffeine]    hallucination   Hydrocodone-acetaminophen    hallucinations   Mobic [meloxicam]    hallucinations   Norvasc [amlodipine Besylate]    hallucinations   Statins    Muscle cramps   Tramadol Itching        Medication List     TAKE these medications    acetaminophen 500 MG tablet Commonly known as: TYLENOL Take 1,000 mg by mouth every 6 (six) hours as needed.   aspirin EC 81 MG tablet Take 81 mg by mouth daily. Swallow whole.   carvedilol 12.5 MG tablet Commonly known as: COREG Take 12.5 mg by mouth 2 (two) times daily.   ciprofloxacin 500 MG tablet Commonly known as: CIPRO Take 1 tablet (500 mg total) by mouth 2 (two) times daily for 21 days.   cyclopentolate 1 % ophthalmic solution Commonly known as: CYCLODRYL,CYCLOGYL Place 1 drop into the left eye 3 (three) times daily for 7 days.   finasteride 5 MG tablet Commonly known as: PROSCAR Take 5 mg by mouth every morning.   gabapentin 600 MG tablet  Commonly known as: NEURONTIN Take 600 mg by mouth 2 (two) times daily.   gatifloxacin 0.5 % Soln Commonly known as: ZYMAXID Place 1 drop into the left eye 4 (four) times daily for 7 days.   linezolid 600 MG tablet Commonly known as: ZYVOX Take 1 tablet (600 mg total) by mouth 2 (two) times daily  for 21 days.   lisinopril 2.5 MG tablet Commonly known as: ZESTRIL Take 1 tablet (2.5 mg total) by mouth daily. What changed: when to take this   metFORMIN 500 MG tablet Commonly known as: GLUCOPHAGE Take 500 mg by mouth 2 (two) times daily.   methocarbamol 750 MG tablet Commonly known as: ROBAXIN Take 1 tablet (750 mg total) by mouth 3 (three) times daily.   Ocuflox 0.3 % ophthalmic solution Generic drug: ofloxacin Place 1 drop into the left eye 4 (four) times daily.   Pred Forte 1 % ophthalmic suspension Generic drug: prednisoLONE acetate Place 1 drop into the left eye 4 (four) times daily. What changed: Another medication with the same name was added. Make sure you understand how and when to take each.   prednisoLONE acetate 1 % ophthalmic suspension Commonly known as: PRED FORTE Place 1 drop into the left eye 4 (four) times daily for 7 days. What changed: You were already taking a medication with the same name, and this prescription was added. Make sure you understand how and when to take each.   rosuvastatin 5 MG tablet Commonly known as: CRESTOR Take 5 mg by mouth daily.   senna 8.6 MG Tabs tablet Commonly known as: SENOKOT Take 1 tablet (8.6 mg total) by mouth daily as needed for mild constipation.   tamsulosin 0.4 MG Caps capsule Commonly known as: FLOMAX Take 0.4 mg by mouth daily.   torsemide 20 MG tablet Commonly known as: DEMADEX Take 20 mg by mouth daily.       Consultations: Ophthalmology  ID  Procedures/Studies:  CT HEAD WO CONTRAST  Result Date: 08/27/2022 CLINICAL DATA:  Frontal sinus pain EXAM: CT HEAD WITHOUT CONTRAST TECHNIQUE: Contiguous axial images were obtained from the base of the skull through the vertex without intravenous contrast. RADIATION DOSE REDUCTION: This exam was performed according to the departmental dose-optimization program which includes automated exposure control, adjustment of the mA and/or kV according to patient size  and/or use of iterative reconstruction technique. COMPARISON:  02/26/2018 FINDINGS: Brain: There is no mass, hemorrhage or extra-axial collection. The size and configuration of the ventricles and extra-axial CSF spaces are normal. There is hypoattenuation of the white matter, most commonly indicating chronic small vessel disease. Vascular: No abnormal hyperdensity of the major intracranial arteries or dural venous sinuses. No intracranial atherosclerosis. Skull: The visualized skull base, calvarium and extracranial soft tissues are normal. Sinuses/Orbits: There is air within the anterior chamber of the left globe. The paranasal sinuses are clear. IMPRESSION: 1. No acute intracranial abnormality. 2. Air within the anterior chamber of the left globe, of uncertain etiology, possibly iatrogenic. Electronically Signed   By: Deatra Robinson M.D.   On: 08/27/2022 22:46   CT ABDOMEN WO CONTRAST  Result Date: 08/27/2022 CLINICAL DATA:  Left upper quadrant and left flank pain EXAM: CT ABDOMEN WITHOUT CONTRAST TECHNIQUE: Multidetector CT imaging of the abdomen was performed following the standard protocol without IV contrast. Evaluation of the soft tissues, solid viscera, and vascular structures is limited without IV contrast. RADIATION DOSE REDUCTION: This exam was performed according to the departmental dose-optimization program which includes automated exposure control,  adjustment of the mA and/or kV according to patient size and/or use of iterative reconstruction technique. COMPARISON:  02/27/2018 FINDINGS: Lower chest: No acute pleural or parenchymal lung disease. Small pericardial effusion, measuring up to 1.2 cm in maximal thickness. Diffuse atherosclerosis of the aorta and coronary vasculature. Hepatobiliary: Cholecystectomy. Unremarkable unenhanced appearance of the liver. Pancreas: Unremarkable unenhanced appearance. Spleen: Unremarkable unenhanced appearance. Adrenals/Urinary Tract: Punctate less than 2 mm  nonobstructing calculi lower pole left kidney reference image 44/2. No right-sided calculi. No obstructive uropathy within either kidney. Unremarkable unenhanced appearance of the renal parenchyma. The adrenals are unremarkable. Stomach/Bowel: No bowel obstruction or ileus. Diverticulosis of the descending colon without evidence of acute diverticulitis. No bowel wall thickening or inflammatory change. Vascular/Lymphatic: Atherosclerosis of the abdominal aorta. No pathologic adenopathy. Other: No free fluid or free intraperitoneal gas. No abdominal wall hernia. Musculoskeletal: Postsurgical changes from L4-5 and L5-S1 discectomy and posterior fusion. 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 No acute bony abnormalities. Reconstructed images demonstrate no additional findings. IMPRESSION: 1. Punctate less than 2 mm nonobstructing left renal calculi. No obstructive uropathy within either kidney. 2. Small pericardial effusion. 3. Distal colonic diverticulosis without diverticulitis. 4. Aortic Atherosclerosis (ICD10-I70.0). Coronary artery atherosclerosis. Electronically Signed   By: Sharlet Salina M.D.   On: 08/27/2022 21:39     Subjective: - no chest pain, shortness of breath, no abdominal pain, nausea or vomiting.   Discharge Exam: BP 117/73 (BP Location: Left Arm)   Pulse 62   Temp 98.3 F (36.8 C)   Resp 20   Ht 5\' 9"  (1.753 m)   Wt 88.5 kg   SpO2 100%   BMI 28.80 kg/m   General: Pt is alert, awake, not in acute distress Cardiovascular: RRR, S1/S2 +, no rubs, no gallops Respiratory: CTA bilaterally, no wheezing, no rhonchi Abdominal: Soft, NT, ND, bowel sounds + Extremities: no edema, no cyanosis    The results of significant diagnostics from this hospitalization (including imaging, microbiology, ancillary and laboratory) are listed below for reference.     Microbiology: Recent Results (from the past 240 hour(s))  Aerobic/Anaerobic Culture w Gram Stain (surgical/deep  wound)     Status: None (Preliminary result)   Collection Time: 08/27/22  1:40 PM   Specimen: Path fluid; Body Fluid  Result Value Ref Range Status   Specimen Description FLUID  Final   Special Requests LEFT EYE FLUID  Final   Gram Stain   Final    RARE WBC PRESENT, PREDOMINANTLY PMN NO ORGANISMS SEEN    Culture   Final    NO GROWTH 2 DAYS NO ANAEROBES ISOLATED; CULTURE IN PROGRESS FOR 5 DAYS Performed at [facility], [address] [place], [place] [address]    Report Status PENDING  Incomplete  Aerobic/Anaerobic Culture w Gram Stain (surgical/deep wound)     Status: None (Preliminary result)   Collection Time: 08/27/22  1:47 PM   Specimen: Path fluid; Body Fluid  Result Value Ref Range Status   Specimen Description FLUID  Final   Special Requests LEFT EYE WASHING  Final   Gram Stain NO WBC SEEN NO ORGANISMS SEEN   Final   Culture   Final    NO GROWTH 2 DAYS NO ANAEROBES ISOLATED; CULTURE IN PROGRESS FOR 5 DAYS Performed at [facility], [address], [place], [place] [address]    Report Status PENDING  Incomplete  Urine Culture     Status: Abnormal   Collection Time: 08/27/22  2:20  PM   Specimen: Urine, Clean Catch  Result Value Ref Range Status   Specimen Description URINE, CLEAN CATCH  Final   Special Requests   Final    NONE Performed at [facility], [address], [place], [place] [address]    Culture >=100,000 COLONIES/mL ENTEROCOCCUS FAECALIS (A)  Final   Report Status 08/29/2022 FINAL  Final   Organism ID, Bacteria ENTEROCOCCUS FAECALIS (A)  Final      Susceptibility   Enterococcus faecalis - MIC*    AMPICILLIN <=2 SENSITIVE Sensitive     NITROFURANTOIN <=16 SENSITIVE Sensitive     VANCOMYCIN 1 SENSITIVE Sensitive     * >=100,000 COLONIES/mL ENTEROCOCCUS FAECALIS     Labs: Basic Metabolic Panel: Recent Labs  Lab 08/27/22 1125 08/27/22 1547 08/29/22 1448  NA 136 138 137  K 3.6 3.6 3.6  CL 103 105 103  CO2 21* 21* 25  GLUCOSE  113* 116* 161*  BUN 23 23 40*  CREATININE 1.29* 1.19 1.27*  CALCIUM 9.3 8.7* 8.6*   Liver Function Tests: Recent Labs  Lab 08/27/22 1547  AST 18  ALT 18  ALKPHOS 125  BILITOT 0.8  PROT 6.5  ALBUMIN 3.3*   CBC: Recent Labs  Lab 08/27/22 1125 08/27/22 1547 08/29/22 1448  WBC 9.7 9.0 8.5  NEUTROABS  --  7.4  --   HGB 14.4 12.9* 12.5*  HCT 42.6 38.9* 36.5*  MCV 94.0 95.6 95.8  PLT 241 209 228   CBG: Recent Labs  Lab 08/28/22 1233 08/28/22 1611 08/28/22 2107 08/29/22 0840 08/29/22 1240  GLUCAP 111* 117* 134* 108* 160*   Hgb A1c Recent Labs    08/27/22 1547  HGBA1C 6.4*   Lipid Profile No results for input(s): "CHOL", "HDL", "LDLCALC", "TRIG", "CHOLHDL", "LDLDIRECT" in the last 72 hours. Thyroid function studies No results for input(s): "TSH", "T4TOTAL", "T3FREE", "THYROIDAB" in the last 72 hours.  Invalid input(s): "FREET3" Urinalysis    Component Value Date/Time   COLORURINE STRAW (A) 03/07/2022 0958   APPEARANCEUR CLEAR (A) 03/07/2022 0958   APPEARANCEUR Clear 12/22/2013 0943   LABSPEC 1.012 03/07/2022 0958   LABSPEC 1.022 12/22/2013 0943   PHURINE 5.0 03/07/2022 0958   GLUCOSEU NEGATIVE 03/07/2022 0958   GLUCOSEU >=500 12/22/2013 0943   HGBUR NEGATIVE 03/07/2022 0958   BILIRUBINUR NEGATIVE 03/07/2022 0958   BILIRUBINUR Negative 12/22/2013 0943   KETONESUR NEGATIVE 03/07/2022 0958   PROTEINUR NEGATIVE 03/07/2022 0958   NITRITE NEGATIVE 03/07/2022 0958   LEUKOCYTESUR NEGATIVE 03/07/2022 0958   LEUKOCYTESUR Negative 12/22/2013 0943    FURTHER DISCHARGE INSTRUCTIONS:   Get Medicines reviewed and adjusted: Please take all your medications with you for your next visit with your Primary MD   Laboratory/radiological data: Please request your Primary MD to go over all hospital tests and procedure/radiological results at the follow up, please ask your Primary MD to get all Hospital records sent to his/her office.   In some cases, they will be blood  work, cultures and biopsy results pending at the time of your discharge. Please request that your primary care M.D. goes through all the records of your hospital data and follows up on these results.   Also Note the following: If you experience worsening of your admission symptoms, develop shortness of breath, life threatening emergency, suicidal or homicidal thoughts you must seek medical attention immediately by calling 911 or calling your MD immediately  if symptoms less severe.   You must read complete instructions/literature along with all the possible  adverse reactions/side effects for all the Medicines you take and that have been prescribed to you. Take any new Medicines after you have completely understood and accpet all the possible adverse reactions/side effects.    Do not drive when taking Pain medications or sleeping medications (Benzodaizepines)   Do not take more than prescribed Pain, Sleep and Anxiety Medications. It is not advisable to combine anxiety,sleep and pain medications without talking with your primary care practitioner   Special Instructions: If you have smoked or chewed Tobacco  in the last 2 yrs please stop smoking, stop any regular Alcohol  and or any Recreational drug use.   Wear Seat belts while driving.   Please note: You were cared for by a hospitalist during your hospital stay. Once you are discharged, your primary care physician will handle any further medical issues. Please note that NO REFILLS for any discharge medications will be authorized once you are discharged, as it is imperative that you return to your primary care physician (or establish a relationship with a primary care physician if you do not have one) for your post hospital discharge needs so that they can reassess your need for medications and monitor your lab values.  Time coordinating discharge: 35 minutes  SIGNED:  Pamella Pert, MD, PhD 08/29/2022, 4:35 PM

## 2022-08-29 NOTE — Progress Notes (Addendum)
[facility] Infectious Diseases Follow Up Note  Patient Identification: Patient Name: Michael Humphrey MRN: [id number] Admit Date: 08/27/2022 11:14 AM Age: 72 y.o.Today's Date: 08/29/2022  Reason for Visit: endogenous endopthalmitis  Principal Problem:   Ophthalmitis Active Problems:   DM (diabetes mellitus) with complications (HCC)   HTN (hypertension)   Acute left flank pain   Frontal sinus pain   CAD (coronary artery disease)   Antibiotics:  Ceftriaxone 8/26 Vancomycin 8/27-c Ceftazidime 8/27-c   Lines/Hardware: Bilateral knee and hip replacement  Interval Events: afebrile. No update on cultures    Assessment 72 year old male with PMH as below including DM with NPDR, prior h/o cataract extraction w phaco and IO lens implantation, HTN, HLD, CAD, OA admitted with concern for   # 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 Luciana Axe: No known recent injury or trauma or surgical procedure or recent infections or being on abtx  8/26  diagnostic and therapeutic vitrectomy, removal of anterior chamber, iris membranes, anterior chamber diagnostic tap and vitreous washings plus injection of intravitreal antibiotics vancomycin and ceftazidime Low suspicion for fungal or othet Ois at this time Did not get blood cx on admission prior to abtx. He was already on abtx when I saw 8/27   # E faecalis UTI - some dysuria and increased frequency, covered by Vancomycin/Linezolid   # DM - A1c 6.4 08/27/22   Recommendation Continue Vancomycin, pharmacy to dose and ceftazidime for now  Unclear if needs repeat intravitreal abtx as patient still has poor vision in his left eye. He however is very eager  to go home, I called Dr Ephriam Knuckles office twice and have left my cell phone number to call back, He is in a procedure per RN. I have not received a call back. Per Dt Gherghe, Dr Barbaraann Barthel did not want any additional intervention and  wants to fu in 5 days in office.  If no further Ophthalmologic intervention planned, plan for PO linezolid and ciprofloxacin for 2-3 weeks( preferably latter with unclear source) Fu cx to completion  Fu with Dr Thedore Mins made on 9/11 at 2: 30 pm to check CBC ID available as needed, please recall if questions or concerns   Rest of the management as per the primary team. Thank you for the consult. Please page with pertinent questions or concerns.  ______________________________________________________________________ Subjective patient seen and examined at the bedside. Wife at bedside, Reports he cannot see from his left eye but see's better when both eyes are opened   Vitals BP 117/73 (BP Location: Left Arm)   Pulse 62   Temp 98.3 F (36.8 C)   Resp 20   Ht 5\' 9"  (1.753 m)   Wt 88.5 kg   SpO2 100%   BMI 28.80 kg/m     Physical Exam Constitutional:  adult male sitting in the bed, NAD    Comments: Left eye - still unable to see with some periorbital swelling/erythema, no purulent drainage. EOMI   Cardiovascular:     Rate and Rhythm: Normal rate and regular rhythm.     Heart sounds:  Pulmonary:     Effort: Pulmonary effort is normal.     Comments:   Abdominal:     Palpations: Abdomen is non distended     Tenderness:   Musculoskeletal:        General: No swelling or tenderness in peripheral joints   Skin:    Comments: No rashes   Neurological:     General: awake, alert and oriented, following  commands   Psychiatric:        Mood and Affect: Mood normal.     Pertinent Microbiology Results for orders placed or performed during the hospital encounter of 08/27/22  Aerobic/Anaerobic Culture w Gram Stain (surgical/deep wound)     Status: None (Preliminary result)   Collection Time: 08/27/22  1:40 PM   Specimen: Path fluid; Body Fluid  Result Value Ref Range Status   Specimen Description FLUID  Final   Special Requests LEFT EYE FLUID  Final   Gram Stain   Final    RARE  WBC PRESENT, PREDOMINANTLY PMN NO ORGANISMS SEEN    Culture   Final    NO GROWTH 2 DAYS Performed at [facility], [address], [place], [place] [address]    Report Status PENDING  Incomplete  Aerobic/Anaerobic Culture w Gram Stain (surgical/deep wound)     Status: None (Preliminary result)   Collection Time: 08/27/22  1:47 PM   Specimen: Path fluid; Body Fluid  Result Value Ref Range Status   Specimen Description FLUID  Final   Special Requests LEFT EYE WASHING  Final   Gram Stain NO WBC SEEN NO ORGANISMS SEEN   Final   Culture   Final    NO GROWTH 2 DAYS Performed at [facility], [address], [place], [place] [address]    Report Status PENDING  Incomplete  Urine Culture     Status: Abnormal   Collection Time: 08/27/22  2:20 PM   Specimen: Urine, Clean Catch  Result Value Ref Range Status   Specimen Description URINE, CLEAN CATCH  Final   Special Requests   Final    NONE Performed at [facility], [address], [place], [place] [address]    Culture >=100,000 COLONIES/mL ENTEROCOCCUS FAECALIS (A)  Final   Report Status 08/29/2022 FINAL  Final   Organism ID, Bacteria ENTEROCOCCUS FAECALIS (A)  Final      Susceptibility   Enterococcus faecalis - MIC*    AMPICILLIN <=2 SENSITIVE Sensitive     NITROFURANTOIN <=16 SENSITIVE Sensitive     VANCOMYCIN 1 SENSITIVE Sensitive     * >=100,000 COLONIES/mL ENTEROCOCCUS FAECALIS   Pertinent Lab. CBC    Component Value Date/Time   WBC 9.0 08/27/2022 1547   RBC 4.07 (L) 08/27/2022 1547   HGB 12.9 (L) 08/27/2022 1547   HGB 12.2 (L) 01/05/2014 0730   HCT 38.9 (L) 08/27/2022 1547   HCT 36.1 (L) 01/05/2014 0730   PLT 209 08/27/2022 1547   PLT 348 01/05/2014 0730   MCV 95.6 08/27/2022 1547   MCV 96 01/05/2014 0730   MCH 31.7 08/27/2022 1547   MCHC 33.2 08/27/2022 1547   RDW 13.8 08/27/2022 1547   RDW 14.1 01/05/2014 0730   LYMPHSABS 1.1 08/27/2022 1547   LYMPHSABS 1.7 01/05/2014 0730   MONOABS 0.4  08/27/2022 1547   MONOABS 1.0 01/05/2014 0730   EOSABS 0.0 08/27/2022 1547   EOSABS 0.3 01/05/2014 0730   BASOSABS 0.0 08/27/2022 1547   BASOSABS 0.1 01/05/2014 0730   BASOSABS 0 12/22/2013 1843       Latest Ref Rng & Units 08/27/2022    3:47 PM 08/27/2022   11:25 AM 03/07/2022    9:54 AM  CMP  Glucose 70 - 99 mg/dL 034  742  595   BUN 8 - 23 mg/dL 23  23  16    Creatinine 0.61 - 1.24 mg/dL 6.38  7.56  4.33   Sodium 135 -  145 mmol/L 138  136  136   Potassium 3.5 - 5.1 mmol/L 3.6  3.6  4.0   Chloride 98 - 111 mmol/L 105  103  104   CO2 22 - 32 mmol/L 21  21  25    Calcium 8.9 - 10.3 mg/dL 8.7  9.3  9.1   Total Protein 6.5 - 8.1 g/dL 6.5     Total Bilirubin 0.3 - 1.2 mg/dL 0.8     Alkaline Phos 38 - 126 U/L 125     AST 15 - 41 U/L 18     ALT 0 - 44 U/L 18       Pertinent Imaging today Plain films and CT images have been personally visualized and interpreted; radiology reports have been reviewed. Decision making incorporated into the Impression  No results found.  I have personally spent 50 minutes involved in face-to-face and non-face-to-face activities for this patient on the day of the visit. Professional time spent includes the following activities: Preparing to see the patient (review of tests), Obtaining and/or reviewing separately obtained history (admission/discharge record), Performing a medically appropriate examination and/or evaluation , Ordering medications/tests/procedures, referring and communicating with other health care professionals, Documenting clinical information in the EMR, Independently interpreting results (not separately reported), Communicating results to the patient/family/caregiver, Counseling and educating the patient/family/caregiver and Care coordination (not separately reported).   Plan d/w requesting provider as well as ID pharm D  Note: This document was prepared using dragon voice recognition software and may include unintentional dictation errors.    Electronically signed by:   Odette Fraction, MD Infectious Disease Physician [facility] Pager: [phone]

## 2022-08-29 NOTE — Plan of Care (Signed)
  Problem: Education: Goal: Ability to describe self-care measures that may prevent or decrease complications (Diabetes Survival Skills Education) will improve Outcome: Progressing Goal: Individualized Educational Video(s) Outcome: Progressing   Problem: Coping: Goal: Ability to adjust to condition or change in health will improve Outcome: Progressing   Problem: Fluid Volume: Goal: Ability to maintain a balanced intake and output will improve Outcome: Progressing   Problem: Health Behavior/Discharge Planning: Goal: Ability to identify and utilize available resources and services will improve Outcome: Progressing Goal: Ability to manage health-related needs will improve Outcome: Progressing   Problem: Metabolic: Goal: Ability to maintain appropriate glucose levels will improve Outcome: Progressing   Problem: Nutritional: Goal: Maintenance of adequate nutrition will improve Outcome: Progressing Goal: Progress toward achieving an optimal weight will improve Outcome: Progressing   Problem: Skin Integrity: Goal: Risk for impaired skin integrity will decrease Outcome: Progressing   Problem: Tissue Perfusion: Goal: Adequacy of tissue perfusion will improve Outcome: Progressing   Problem: Education: Goal: Knowledge of General Education information will improve Description: Including pain rating scale, medication(s)/side effects and non-pharmacologic comfort measures Outcome: Progressing   Problem: Health Behavior/Discharge Planning: Goal: Ability to manage health-related needs will improve Outcome: Progressing   Problem: Clinical Measurements: Goal: Ability to maintain clinical measurements within normal limits will improve Outcome: Progressing Goal: Will remain free from infection Outcome: Progressing Goal: Diagnostic test results will improve Outcome: Progressing   Problem: Activity: Goal: Risk for activity intolerance will decrease Outcome: Progressing   Problem:  Nutrition: Goal: Adequate nutrition will be maintained Outcome: Progressing   Problem: Coping: Goal: Level of anxiety will decrease Outcome: Progressing   Problem: Elimination: Goal: Will not experience complications related to bowel motility Outcome: Progressing Goal: Will not experience complications related to urinary retention Outcome: Progressing   Problem: Pain Managment: Goal: General experience of comfort will improve Outcome: Progressing   Problem: Safety: Goal: Ability to remain free from injury will improve Outcome: Progressing   Problem: Skin Integrity: Goal: Risk for impaired skin integrity will decrease Outcome: Progressing

## 2022-09-01 DIAGNOSIS — H44002 Unspecified purulent endophthalmitis, left eye: Secondary | ICD-10-CM | POA: Diagnosis not present

## 2022-09-01 DIAGNOSIS — G8918 Other acute postprocedural pain: Secondary | ICD-10-CM | POA: Diagnosis not present

## 2022-09-01 DIAGNOSIS — H5712 Ocular pain, left eye: Secondary | ICD-10-CM | POA: Diagnosis not present

## 2022-09-01 LAB — AEROBIC/ANAEROBIC CULTURE W GRAM STAIN (SURGICAL/DEEP WOUND)
Culture: NO GROWTH
Culture: NO GROWTH
Gram Stain: NONE SEEN

## 2022-09-02 DIAGNOSIS — B952 Enterococcus as the cause of diseases classified elsewhere: Secondary | ICD-10-CM | POA: Diagnosis not present

## 2022-09-02 DIAGNOSIS — T8140XA Infection following a procedure, unspecified, initial encounter: Secondary | ICD-10-CM | POA: Diagnosis not present

## 2022-09-02 DIAGNOSIS — H44009 Unspecified purulent endophthalmitis, unspecified eye: Secondary | ICD-10-CM | POA: Diagnosis not present

## 2022-09-02 DIAGNOSIS — R918 Other nonspecific abnormal finding of lung field: Secondary | ICD-10-CM | POA: Diagnosis not present

## 2022-09-03 DIAGNOSIS — T8140XA Infection following a procedure, unspecified, initial encounter: Secondary | ICD-10-CM | POA: Diagnosis not present

## 2022-09-03 DIAGNOSIS — H44009 Unspecified purulent endophthalmitis, unspecified eye: Secondary | ICD-10-CM | POA: Diagnosis not present

## 2022-09-04 DIAGNOSIS — N400 Enlarged prostate without lower urinary tract symptoms: Secondary | ICD-10-CM | POA: Diagnosis not present

## 2022-09-04 DIAGNOSIS — I10 Essential (primary) hypertension: Secondary | ICD-10-CM | POA: Diagnosis not present

## 2022-09-04 DIAGNOSIS — E11319 Type 2 diabetes mellitus with unspecified diabetic retinopathy without macular edema: Secondary | ICD-10-CM | POA: Diagnosis not present

## 2022-09-04 DIAGNOSIS — H44002 Unspecified purulent endophthalmitis, left eye: Secondary | ICD-10-CM | POA: Diagnosis not present

## 2022-09-04 DIAGNOSIS — Z66 Do not resuscitate: Secondary | ICD-10-CM | POA: Diagnosis not present

## 2022-09-04 DIAGNOSIS — Z79899 Other long term (current) drug therapy: Secondary | ICD-10-CM | POA: Diagnosis not present

## 2022-09-04 DIAGNOSIS — R519 Headache, unspecified: Secondary | ICD-10-CM | POA: Diagnosis not present

## 2022-09-04 DIAGNOSIS — E785 Hyperlipidemia, unspecified: Secondary | ICD-10-CM | POA: Diagnosis not present

## 2022-09-04 DIAGNOSIS — B952 Enterococcus as the cause of diseases classified elsewhere: Secondary | ICD-10-CM | POA: Diagnosis not present

## 2022-09-04 DIAGNOSIS — I351 Nonrheumatic aortic (valve) insufficiency: Secondary | ICD-10-CM | POA: Diagnosis not present

## 2022-09-04 DIAGNOSIS — Z8673 Personal history of transient ischemic attack (TIA), and cerebral infarction without residual deficits: Secondary | ICD-10-CM | POA: Diagnosis not present

## 2022-09-04 DIAGNOSIS — I2510 Atherosclerotic heart disease of native coronary artery without angina pectoris: Secondary | ICD-10-CM | POA: Diagnosis not present

## 2022-09-04 DIAGNOSIS — H44009 Unspecified purulent endophthalmitis, unspecified eye: Secondary | ICD-10-CM | POA: Diagnosis not present

## 2022-09-04 DIAGNOSIS — H4419 Other endophthalmitis: Secondary | ICD-10-CM | POA: Diagnosis not present

## 2022-09-04 DIAGNOSIS — Z86711 Personal history of pulmonary embolism: Secondary | ICD-10-CM | POA: Diagnosis not present

## 2022-09-04 DIAGNOSIS — E1165 Type 2 diabetes mellitus with hyperglycemia: Secondary | ICD-10-CM | POA: Diagnosis not present

## 2022-09-04 DIAGNOSIS — Z7982 Long term (current) use of aspirin: Secondary | ICD-10-CM | POA: Diagnosis not present

## 2022-09-04 DIAGNOSIS — I358 Other nonrheumatic aortic valve disorders: Secondary | ICD-10-CM | POA: Diagnosis not present

## 2022-09-04 DIAGNOSIS — R8271 Bacteriuria: Secondary | ICD-10-CM | POA: Diagnosis not present

## 2022-09-04 DIAGNOSIS — Z7984 Long term (current) use of oral hypoglycemic drugs: Secondary | ICD-10-CM | POA: Diagnosis not present

## 2022-09-04 DIAGNOSIS — T8140XA Infection following a procedure, unspecified, initial encounter: Secondary | ICD-10-CM | POA: Diagnosis not present

## 2022-09-05 DIAGNOSIS — R519 Headache, unspecified: Secondary | ICD-10-CM | POA: Diagnosis not present

## 2022-09-06 DIAGNOSIS — H44009 Unspecified purulent endophthalmitis, unspecified eye: Secondary | ICD-10-CM | POA: Diagnosis not present

## 2022-09-10 DIAGNOSIS — H4419 Other endophthalmitis: Secondary | ICD-10-CM | POA: Diagnosis not present

## 2022-09-10 DIAGNOSIS — N183 Chronic kidney disease, stage 3 unspecified: Secondary | ICD-10-CM | POA: Diagnosis not present

## 2022-09-10 DIAGNOSIS — I779 Disorder of arteries and arterioles, unspecified: Secondary | ICD-10-CM | POA: Diagnosis not present

## 2022-09-10 DIAGNOSIS — I10 Essential (primary) hypertension: Secondary | ICD-10-CM | POA: Diagnosis not present

## 2022-09-10 DIAGNOSIS — I129 Hypertensive chronic kidney disease with stage 1 through stage 4 chronic kidney disease, or unspecified chronic kidney disease: Secondary | ICD-10-CM | POA: Diagnosis not present

## 2022-09-10 DIAGNOSIS — E1122 Type 2 diabetes mellitus with diabetic chronic kidney disease: Secondary | ICD-10-CM | POA: Diagnosis not present

## 2022-09-10 DIAGNOSIS — H44002 Unspecified purulent endophthalmitis, left eye: Secondary | ICD-10-CM | POA: Diagnosis not present

## 2022-09-12 ENCOUNTER — Ambulatory Visit (INDEPENDENT_AMBULATORY_CARE_PROVIDER_SITE_OTHER): Payer: PPO | Admitting: Internal Medicine

## 2022-09-12 ENCOUNTER — Other Ambulatory Visit: Payer: Self-pay

## 2022-09-12 ENCOUNTER — Encounter: Payer: Self-pay | Admitting: Internal Medicine

## 2022-09-12 VITALS — BP 127/77 | HR 77 | Resp 16 | Ht 69.0 in | Wt 172.3 lb

## 2022-09-12 DIAGNOSIS — H21542 Posterior synechiae (iris), left eye: Secondary | ICD-10-CM | POA: Diagnosis not present

## 2022-09-12 DIAGNOSIS — E113411 Type 2 diabetes mellitus with severe nonproliferative diabetic retinopathy with macular edema, right eye: Secondary | ICD-10-CM | POA: Diagnosis not present

## 2022-09-12 DIAGNOSIS — E113512 Type 2 diabetes mellitus with proliferative diabetic retinopathy with macular edema, left eye: Secondary | ICD-10-CM | POA: Diagnosis not present

## 2022-09-12 DIAGNOSIS — H43811 Vitreous degeneration, right eye: Secondary | ICD-10-CM | POA: Diagnosis not present

## 2022-09-12 DIAGNOSIS — H4419 Other endophthalmitis: Secondary | ICD-10-CM | POA: Diagnosis not present

## 2022-09-12 DIAGNOSIS — H33321 Round hole, right eye: Secondary | ICD-10-CM | POA: Diagnosis not present

## 2022-09-12 DIAGNOSIS — E113412 Type 2 diabetes mellitus with severe nonproliferative diabetic retinopathy with macular edema, left eye: Secondary | ICD-10-CM | POA: Diagnosis not present

## 2022-09-12 DIAGNOSIS — Z794 Long term (current) use of insulin: Secondary | ICD-10-CM | POA: Diagnosis not present

## 2022-09-12 DIAGNOSIS — H109 Unspecified conjunctivitis: Secondary | ICD-10-CM | POA: Diagnosis not present

## 2022-09-12 DIAGNOSIS — H44002 Unspecified purulent endophthalmitis, left eye: Secondary | ICD-10-CM | POA: Diagnosis not present

## 2022-09-12 NOTE — Patient Instructions (Signed)
Linnezolid 600mg  bid till 9/16

## 2022-09-12 NOTE — Progress Notes (Signed)
Patient: Michael Humphrey  DOB: 1950/02/23 MRN: [id number] PCP: Lauro Regulus, MD    Chief Complaint  Patient presents with   Follow-up     Patient Active Problem List   Diagnosis Date Noted   Ophthalmitis 08/27/2022   DM (diabetes mellitus) with complications (HCC) 08/27/2022   HTN (hypertension) 08/27/2022   Acute left flank pain 08/27/2022   Frontal sinus pain 08/27/2022   CAD (coronary artery disease) 08/27/2022   S/P lumbar fusion 03/19/2022   Chronic bilateral low back pain with left-sided sciatica 03/19/2022   Rectal bleeding    Polyp of descending colon    Posterior capsular opacification, left 09/26/2020   Peripheral retinal hole, right 12/10/2019   Lower limb ulcer, calf, right, limited to breakdown of skin (HCC) 09/04/2019   Severe nonproliferative diabetic retinopathy of right eye, with macular edema, associated with type 2 diabetes mellitus (HCC) 05/26/2019   Severe nonproliferative diabetic retinopathy of left eye, with macular edema, associated with type 2 diabetes mellitus (HCC) 05/26/2019   Hematuria, gross    Pulmonary embolism (HCC) 02/26/2018   Dizziness 02/26/2018   CVA (cerebral vascular accident) (HCC) 07/15/2017     Subjective:  72 year old male with PMH as below including DM with NPDR, prior h/o cataract extraction w [place] and IO lens implantation, HTN, HLD, CAD, OA presents 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 Discharged on lincezolid_ cipro for 2-3 weeks.  Since discharge from home on 8/28, patient developed worsening pain and was sent to ED by ophthalmology on-call.  Patient was admitted to [facility] and underwent repeat tap on 8/31 with negative cultures.  Discharged on linezolid and ceftriaxone's x 3 weeks EOT 9/16.  Pt reprots he is doing well on abx, tolerating abx.  HPI: Patient was admitted to hospital 8/26 - 8/28 for nausea and colitis followed by Dr. Hardin Negus.  No known recent injury or trauma or surgical  procedures.  Diagnostic and therapeutic vertebrectomy removal anterior chamber, iris members on 826.  No blood cultures on admission.  Hospital course complicated by E faecalis UTI which was covered by vancomycin/linezolid. Review of Systems  All other systems reviewed and are negative.   Past Medical History:  Diagnosis Date   Anginal pain (HCC)    Arthritis    BPH (benign prostatic hyperplasia)    CAD (coronary artery disease)    a.) LHC 08/22/2012: normal coronaries; b.) LHC 06/08/2021: EF 55-65%. 30% oLM, 100% p-mLAD, 50% o-pLAD, 60% pLAD, 30% pRCA, 75% RPDA, 75% RPAV -- med mgmt.   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   DDD (degenerative disc disease), lumbar    Diabetic retinopathy (HCC)    DISH (diffuse idiopathic skeletal hyperostosis)    Diverticulitis    Full dentures    GERD (gastroesophageal reflux disease)    Heart attack (HCC)    High cholesterol    Hordeolum internum right lower eyelid 12/09/2019   Hypertension    Incomplete right bundle branch block (RBBB)    Lumbar radiculitis    a.) s/p L4-L5 decompression   Lumbar radiculopathy    Posterior capsular opacification, right 09/26/2020   Found  by Dr. Janee Morn recently and sent here   Pulmonary embolism [facility])    Skin cancer    Sleep apnea    a.) unable to tolerate nocturnal PAP therapy   Spinal stenosis of lumbar region, unspecified whether neurogenic claudication present    Statin intolerance    T2DM (type 2 diabetes mellitus) (HCC)    Tubular adenoma of colon     Outpatient Medications Prior to Visit  Medication Sig Dispense Refill    acetaminophen (TYLENOL) 500 MG tablet Take 1,000 mg by mouth every 6 (six) hours as needed.     aspirin EC 81 MG tablet Take 81 mg by mouth daily. Swallow whole.     carvedilol (COREG) 12.5 MG tablet Take 12.5 mg by mouth 2 (two) times daily.   11   cefTRIAXone (ROCEPHIN) 2 g injection Inject into the vein.     finasteride (PROSCAR) 5 MG tablet Take 5 mg by mouth every morning.      gabapentin (NEURONTIN) 600 MG tablet Take 600 mg by mouth 2 (two) times daily.     lisinopril (PRINIVIL,ZESTRIL) 2.5 MG tablet Take 1 tablet (2.5 mg total) by mouth daily. (Patient taking differently: Take 2.5 mg by mouth every morning.)     magnesium citrate solution Take 296 mLs by mouth once.     metFORMIN (GLUCOPHAGE) 500 MG tablet Take 500 mg by mouth 2 (two) times daily.     methocarbamol (ROBAXIN) 750 MG tablet Take 1 tablet (750 mg total) by mouth 3 (three) times daily. 90 tablet 0   OCUFLOX 0.3 % ophthalmic solution Place 1 drop into the left eye 4 (four) times daily.     PRED FORTE 1 % ophthalmic suspension Place 1 drop into the left eye 4 (four) times daily.     rosuvastatin (CRESTOR) 5 MG tablet Take 5 mg by mouth daily.     senna (SENOKOT) 8.6 MG TABS tablet Take 1 tablet (8.6 mg total) by mouth daily as needed for mild constipation. 30 tablet 0   tamsulosin (FLOMAX) 0.4 MG CAPS capsule Take 0.4 mg by mouth daily.     torsemide (DEMADEX) 20 MG tablet Take 20 mg by mouth daily.     ciprofloxacin (CIPRO) 500 MG tablet Take 1 tablet (500 mg total) by mouth 2 (two) times daily for 21 days. (Patient not taking: Reported on 09/12/2022) 42 tablet 0   linezolid (ZYVOX) 600 MG tablet Take 1 tablet (600 mg total) by mouth 2 (two) times daily for 21 days. (Patient not taking: Reported on 09/12/2022) 42 tablet 0   No facility-administered medications prior to visit.     Allergies  Allergen Reactions   Clonidine Derivatives     Hallucinations    Fioricet [Butalbital-Apap-Caffeine]     hallucination    Hydrocodone-Acetaminophen     hallucinations   Mobic [Meloxicam]     hallucinations   Norvasc [Amlodipine Besylate]     hallucinations   Statins     Muscle cramps   Tramadol Itching    Social History   Tobacco Use   Smoking status: Never   Smokeless tobacco: Former    Types: Chew    Quit date: 02/08/1978   Tobacco comments:    No vap, E cig, chew  Vaping Use   Vaping status: Never Used  Substance Use Topics   Alcohol use: Not Currently   Drug use: No    Family History  Problem Relation Age of Onset   Clotting disorder Father  Hypertension Father     Objective:   Vitals:   09/12/22 1314  BP: 127/77  Pulse: 77  Resp: 16  SpO2: 99%  Weight: 172 lb 4.8 oz (78.2 kg)  Height: 5\' 9"  (1.753 m)   Body mass index is 25.44 kg/m.  Physical Exam Constitutional:      General: He is not in acute distress.    Appearance: He is normal weight. He is not toxic-appearing.  HENT:     Head: Normocephalic and atraumatic.     Right Ear: External ear normal.     Left Ear: External ear normal.     Nose: No congestion or rhinorrhea.     Mouth/Throat:     Mouth: Mucous membranes are moist.     Pharynx: Oropharynx is clear.  Eyes:     Extraocular Movements: Extraocular movements intact.     Conjunctiva/sclera: Conjunctivae normal.     Pupils: Pupils are equal, round, and reactive to light.  Cardiovascular:     Rate and Rhythm: Normal rate and regular rhythm.     Heart sounds: No murmur heard.    No friction rub. No gallop.  Pulmonary:     Effort: Pulmonary effort is normal.     Breath sounds: Normal breath sounds.  Abdominal:     General: Abdomen is flat. Bowel sounds are normal.     Palpations: Abdomen is soft.  Musculoskeletal:        General: No swelling. Normal range of motion.     Cervical back: Normal range of motion and neck supple.  Skin:    General: Skin is warm and dry.  Neurological:     General: No focal deficit present.     Mental Status: He is oriented  to person, place, and time.  Psychiatric:        Mood and Affect: Mood normal.     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 Luciana Axe, ophthalmology.  Injection for DME on 06/2022 developed left eye pain admitted to [facility] (8/26-28).  Underwent diagnostic/therapeutic vitrectomy started on Vanco and ceftazidime, cultures with no growth.  Patient discharged (8/28)on linezolid and ciprofloxacin.  Notes that did not pick up the ciprofloxacin.   -Following discharge developed worsening left eye pain. Called on call optho and seen in office and [facility] to [facility].  He underwent repeat vitreal injection/tap, which showed cultures no growth.  Discharged on p.o. linezolid and IV ceftriaxone to complete 3 weeks from vitrectomy 08/27/2022 - 09/17/2022. TTE on 9/1, no veg, tech difficult.  Plan: -linezolid and ctx till 9/16 -Pull picc on 9/16 after last dose.  Patient noted that [facility] is following labs. -Pt states he saw [facility] today, and then in 2 weeks -Follow-up with infectious disease in 2 weeks   Danelle Earthly, MD [facility]   09/12/22  1:30 PM   I have personally spent 54 minutes involved in face-to-face and non-face-to-face activities for this patient on the day of the visit. Professional time spent includes the following activities: Preparing to see the patient (review of tests), Obtaining and/or  reviewing separately obtained history (admission/discharge record), Performing a medically appropriate examination and/or evaluation , Ordering medications/tests/procedures, referring and communicating with other health care professionals, Documenting clinical information in the EMR, Independently interpreting results (not separately reported), Communicating results to the patient/family/caregiver, Counseling and educating the patient/family/caregiver and Care coordination (not separately reported).

## 2022-09-14 DIAGNOSIS — H4419 Other endophthalmitis: Secondary | ICD-10-CM | POA: Diagnosis not present

## 2022-09-17 ENCOUNTER — Telehealth: Payer: Self-pay

## 2022-09-17 NOTE — Telephone Encounter (Signed)
   Per provider ok to PULL PICC after End Date.   Provider:Maya Thedore Mins MD End Date:09/17/2022   Notified [facility] and [facility] to contact Home Health Nurse.

## 2022-09-26 ENCOUNTER — Ambulatory Visit: Payer: PPO | Admitting: Infectious Diseases

## 2022-09-26 DIAGNOSIS — H40212 Acute angle-closure glaucoma, left eye: Secondary | ICD-10-CM | POA: Diagnosis not present

## 2022-09-26 DIAGNOSIS — H43811 Vitreous degeneration, right eye: Secondary | ICD-10-CM | POA: Diagnosis not present

## 2022-09-26 DIAGNOSIS — Z794 Long term (current) use of insulin: Secondary | ICD-10-CM | POA: Diagnosis not present

## 2022-09-26 DIAGNOSIS — H21542 Posterior synechiae (iris), left eye: Secondary | ICD-10-CM | POA: Diagnosis not present

## 2022-09-26 DIAGNOSIS — H33321 Round hole, right eye: Secondary | ICD-10-CM | POA: Diagnosis not present

## 2022-10-01 DIAGNOSIS — L820 Inflamed seborrheic keratosis: Secondary | ICD-10-CM | POA: Diagnosis not present

## 2022-10-08 DIAGNOSIS — S0512XA Contusion of eyeball and orbital tissues, left eye, initial encounter: Secondary | ICD-10-CM | POA: Diagnosis not present

## 2022-10-08 DIAGNOSIS — H0270 Unspecified degenerative disorders of eyelid and periocular area: Secondary | ICD-10-CM | POA: Diagnosis not present

## 2022-10-08 DIAGNOSIS — H5712 Ocular pain, left eye: Secondary | ICD-10-CM | POA: Diagnosis not present

## 2022-10-08 DIAGNOSIS — H57813 Brow ptosis, bilateral: Secondary | ICD-10-CM | POA: Diagnosis not present

## 2022-10-08 DIAGNOSIS — H5442A5 Blindness left eye category 5, normal vision right eye: Secondary | ICD-10-CM | POA: Diagnosis not present

## 2022-10-08 DIAGNOSIS — H44002 Unspecified purulent endophthalmitis, left eye: Secondary | ICD-10-CM | POA: Diagnosis not present

## 2022-10-08 DIAGNOSIS — R519 Headache, unspecified: Secondary | ICD-10-CM | POA: Diagnosis not present

## 2022-10-08 DIAGNOSIS — Z01818 Encounter for other preprocedural examination: Secondary | ICD-10-CM | POA: Diagnosis not present

## 2022-10-08 DIAGNOSIS — H2102 Hyphema, left eye: Secondary | ICD-10-CM | POA: Diagnosis not present

## 2022-10-10 ENCOUNTER — Emergency Department: Payer: PPO

## 2022-10-10 ENCOUNTER — Inpatient Hospital Stay
Admission: EM | Admit: 2022-10-10 | Discharge: 2022-10-11 | DRG: 871 | Disposition: A | Discharge status: Hospice | Payer: PPO | Attending: Student | Admitting: Student

## 2022-10-10 ENCOUNTER — Other Ambulatory Visit: Payer: PPO

## 2022-10-10 ENCOUNTER — Other Ambulatory Visit: Payer: Self-pay

## 2022-10-10 DIAGNOSIS — Z832 Family history of diseases of the blood and blood-forming organs and certain disorders involving the immune mechanism: Secondary | ICD-10-CM

## 2022-10-10 DIAGNOSIS — E113299 Type 2 diabetes mellitus with mild nonproliferative diabetic retinopathy without macular edema, unspecified eye: Secondary | ICD-10-CM | POA: Diagnosis not present

## 2022-10-10 DIAGNOSIS — Z7984 Long term (current) use of oral hypoglycemic drugs: Secondary | ICD-10-CM

## 2022-10-10 DIAGNOSIS — Z86718 Personal history of other venous thrombosis and embolism: Secondary | ICD-10-CM

## 2022-10-10 DIAGNOSIS — R6521 Severe sepsis with septic shock: Secondary | ICD-10-CM | POA: Diagnosis not present

## 2022-10-10 DIAGNOSIS — R0689 Other abnormalities of breathing: Secondary | ICD-10-CM | POA: Diagnosis not present

## 2022-10-10 DIAGNOSIS — Z96643 Presence of artificial hip joint, bilateral: Secondary | ICD-10-CM | POA: Diagnosis present

## 2022-10-10 DIAGNOSIS — E7800 Pure hypercholesterolemia, unspecified: Secondary | ICD-10-CM | POA: Diagnosis not present

## 2022-10-10 DIAGNOSIS — J9811 Atelectasis: Secondary | ICD-10-CM | POA: Diagnosis present

## 2022-10-10 DIAGNOSIS — Z515 Encounter for palliative care: Secondary | ICD-10-CM

## 2022-10-10 DIAGNOSIS — Z66 Do not resuscitate: Secondary | ICD-10-CM | POA: Diagnosis present

## 2022-10-10 DIAGNOSIS — N401 Enlarged prostate with lower urinary tract symptoms: Secondary | ICD-10-CM | POA: Diagnosis not present

## 2022-10-10 DIAGNOSIS — Z79899 Other long term (current) drug therapy: Secondary | ICD-10-CM

## 2022-10-10 DIAGNOSIS — K7689 Other specified diseases of liver: Secondary | ICD-10-CM | POA: Diagnosis not present

## 2022-10-10 DIAGNOSIS — G9341 Metabolic encephalopathy: Secondary | ICD-10-CM | POA: Diagnosis not present

## 2022-10-10 DIAGNOSIS — Z7189 Other specified counseling: Secondary | ICD-10-CM | POA: Diagnosis not present

## 2022-10-10 DIAGNOSIS — Z6823 Body mass index (BMI) 23.0-23.9, adult: Secondary | ICD-10-CM

## 2022-10-10 DIAGNOSIS — R0902 Hypoxemia: Secondary | ICD-10-CM | POA: Diagnosis not present

## 2022-10-10 DIAGNOSIS — Z981 Arthrodesis status: Secondary | ICD-10-CM

## 2022-10-10 DIAGNOSIS — W19XXXA Unspecified fall, initial encounter: Secondary | ICD-10-CM | POA: Diagnosis not present

## 2022-10-10 DIAGNOSIS — D649 Anemia, unspecified: Secondary | ICD-10-CM

## 2022-10-10 DIAGNOSIS — I771 Stricture of artery: Secondary | ICD-10-CM | POA: Diagnosis not present

## 2022-10-10 DIAGNOSIS — E11649 Type 2 diabetes mellitus with hypoglycemia without coma: Secondary | ICD-10-CM | POA: Diagnosis present

## 2022-10-10 DIAGNOSIS — R338 Other retention of urine: Secondary | ICD-10-CM | POA: Diagnosis present

## 2022-10-10 DIAGNOSIS — L89322 Pressure ulcer of left buttock, stage 2: Secondary | ICD-10-CM | POA: Diagnosis present

## 2022-10-10 DIAGNOSIS — I2510 Atherosclerotic heart disease of native coronary artery without angina pectoris: Secondary | ICD-10-CM | POA: Diagnosis present

## 2022-10-10 DIAGNOSIS — R55 Syncope and collapse: Secondary | ICD-10-CM | POA: Diagnosis not present

## 2022-10-10 DIAGNOSIS — N179 Acute kidney failure, unspecified: Secondary | ICD-10-CM | POA: Diagnosis not present

## 2022-10-10 DIAGNOSIS — E43 Unspecified severe protein-calorie malnutrition: Secondary | ICD-10-CM | POA: Diagnosis not present

## 2022-10-10 DIAGNOSIS — I3139 Other pericardial effusion (noninflammatory): Secondary | ICD-10-CM | POA: Diagnosis present

## 2022-10-10 DIAGNOSIS — J9601 Acute respiratory failure with hypoxia: Secondary | ICD-10-CM | POA: Diagnosis not present

## 2022-10-10 DIAGNOSIS — D631 Anemia in chronic kidney disease: Secondary | ICD-10-CM | POA: Diagnosis not present

## 2022-10-10 DIAGNOSIS — I129 Hypertensive chronic kidney disease with stage 1 through stage 4 chronic kidney disease, or unspecified chronic kidney disease: Secondary | ICD-10-CM | POA: Diagnosis present

## 2022-10-10 DIAGNOSIS — Z7401 Bed confinement status: Secondary | ICD-10-CM | POA: Diagnosis not present

## 2022-10-10 DIAGNOSIS — G4733 Obstructive sleep apnea (adult) (pediatric): Secondary | ICD-10-CM | POA: Diagnosis present

## 2022-10-10 DIAGNOSIS — Z8673 Personal history of transient ischemic attack (TIA), and cerebral infarction without residual deficits: Secondary | ICD-10-CM

## 2022-10-10 DIAGNOSIS — Z833 Family history of diabetes mellitus: Secondary | ICD-10-CM

## 2022-10-10 DIAGNOSIS — Z8249 Family history of ischemic heart disease and other diseases of the circulatory system: Secondary | ICD-10-CM

## 2022-10-10 DIAGNOSIS — E876 Hypokalemia: Secondary | ICD-10-CM | POA: Diagnosis not present

## 2022-10-10 DIAGNOSIS — R9431 Abnormal electrocardiogram [ECG] [EKG]: Secondary | ICD-10-CM | POA: Diagnosis not present

## 2022-10-10 DIAGNOSIS — Z860101 Personal history of adenomatous and serrated colon polyps: Secondary | ICD-10-CM

## 2022-10-10 DIAGNOSIS — I252 Old myocardial infarction: Secondary | ICD-10-CM

## 2022-10-10 DIAGNOSIS — Z888 Allergy status to other drugs, medicaments and biological substances status: Secondary | ICD-10-CM

## 2022-10-10 DIAGNOSIS — Z85828 Personal history of other malignant neoplasm of skin: Secondary | ICD-10-CM

## 2022-10-10 DIAGNOSIS — R54 Age-related physical debility: Secondary | ICD-10-CM | POA: Diagnosis present

## 2022-10-10 DIAGNOSIS — I6502 Occlusion and stenosis of left vertebral artery: Secondary | ICD-10-CM | POA: Diagnosis not present

## 2022-10-10 DIAGNOSIS — R140 Abdominal distension (gaseous): Secondary | ICD-10-CM | POA: Diagnosis not present

## 2022-10-10 DIAGNOSIS — K5730 Diverticulosis of large intestine without perforation or abscess without bleeding: Secondary | ICD-10-CM | POA: Diagnosis present

## 2022-10-10 DIAGNOSIS — R000 Tachycardia, unspecified: Secondary | ICD-10-CM | POA: Diagnosis not present

## 2022-10-10 DIAGNOSIS — I672 Cerebral atherosclerosis: Secondary | ICD-10-CM | POA: Diagnosis not present

## 2022-10-10 DIAGNOSIS — M5031 Other cervical disc degeneration,  high cervical region: Secondary | ICD-10-CM | POA: Diagnosis not present

## 2022-10-10 DIAGNOSIS — R590 Localized enlarged lymph nodes: Secondary | ICD-10-CM | POA: Diagnosis not present

## 2022-10-10 DIAGNOSIS — M4810 Ankylosing hyperostosis [Forestier], site unspecified: Secondary | ICD-10-CM | POA: Diagnosis present

## 2022-10-10 DIAGNOSIS — M4312 Spondylolisthesis, cervical region: Secondary | ICD-10-CM | POA: Diagnosis not present

## 2022-10-10 DIAGNOSIS — H44002 Unspecified purulent endophthalmitis, left eye: Secondary | ICD-10-CM | POA: Diagnosis not present

## 2022-10-10 DIAGNOSIS — Z9049 Acquired absence of other specified parts of digestive tract: Secondary | ICD-10-CM

## 2022-10-10 DIAGNOSIS — E1122 Type 2 diabetes mellitus with diabetic chronic kidney disease: Secondary | ICD-10-CM | POA: Diagnosis present

## 2022-10-10 DIAGNOSIS — E11319 Type 2 diabetes mellitus with unspecified diabetic retinopathy without macular edema: Secondary | ICD-10-CM | POA: Diagnosis present

## 2022-10-10 DIAGNOSIS — I959 Hypotension, unspecified: Secondary | ICD-10-CM | POA: Diagnosis not present

## 2022-10-10 DIAGNOSIS — E162 Hypoglycemia, unspecified: Secondary | ICD-10-CM

## 2022-10-10 DIAGNOSIS — E860 Dehydration: Secondary | ICD-10-CM | POA: Diagnosis present

## 2022-10-10 DIAGNOSIS — L8990 Pressure ulcer of unspecified site, unspecified stage: Secondary | ICD-10-CM | POA: Insufficient documentation

## 2022-10-10 DIAGNOSIS — Z886 Allergy status to analgesic agent status: Secondary | ICD-10-CM

## 2022-10-10 DIAGNOSIS — R4781 Slurred speech: Secondary | ICD-10-CM | POA: Diagnosis present

## 2022-10-10 DIAGNOSIS — K219 Gastro-esophageal reflux disease without esophagitis: Secondary | ICD-10-CM | POA: Diagnosis present

## 2022-10-10 DIAGNOSIS — Z87891 Personal history of nicotine dependence: Secondary | ICD-10-CM

## 2022-10-10 DIAGNOSIS — M5033 Other cervical disc degeneration, cervicothoracic region: Secondary | ICD-10-CM | POA: Diagnosis not present

## 2022-10-10 DIAGNOSIS — A419 Sepsis, unspecified organism: Principal | ICD-10-CM | POA: Diagnosis present

## 2022-10-10 DIAGNOSIS — Z86711 Personal history of pulmonary embolism: Secondary | ICD-10-CM

## 2022-10-10 DIAGNOSIS — I6523 Occlusion and stenosis of bilateral carotid arteries: Secondary | ICD-10-CM | POA: Diagnosis not present

## 2022-10-10 DIAGNOSIS — Z96653 Presence of artificial knee joint, bilateral: Secondary | ICD-10-CM | POA: Diagnosis present

## 2022-10-10 DIAGNOSIS — R0989 Other specified symptoms and signs involving the circulatory and respiratory systems: Secondary | ICD-10-CM | POA: Diagnosis not present

## 2022-10-10 DIAGNOSIS — Z1152 Encounter for screening for COVID-19: Secondary | ICD-10-CM

## 2022-10-10 DIAGNOSIS — Z7982 Long term (current) use of aspirin: Secondary | ICD-10-CM

## 2022-10-10 DIAGNOSIS — R4182 Altered mental status, unspecified: Secondary | ICD-10-CM

## 2022-10-10 DIAGNOSIS — S022XXA Fracture of nasal bones, initial encounter for closed fracture: Secondary | ICD-10-CM | POA: Diagnosis not present

## 2022-10-10 DIAGNOSIS — M51369 Other intervertebral disc degeneration, lumbar region without mention of lumbar back pain or lower extremity pain: Secondary | ICD-10-CM | POA: Diagnosis present

## 2022-10-10 DIAGNOSIS — M1909 Primary osteoarthritis, other specified site: Secondary | ICD-10-CM | POA: Diagnosis not present

## 2022-10-10 DIAGNOSIS — R9082 White matter disease, unspecified: Secondary | ICD-10-CM | POA: Diagnosis not present

## 2022-10-10 DIAGNOSIS — Z89422 Acquired absence of other left toe(s): Secondary | ICD-10-CM

## 2022-10-10 DIAGNOSIS — Z885 Allergy status to narcotic agent status: Secondary | ICD-10-CM

## 2022-10-10 DIAGNOSIS — N182 Chronic kidney disease, stage 2 (mild): Secondary | ICD-10-CM | POA: Diagnosis present

## 2022-10-10 DIAGNOSIS — R479 Unspecified speech disturbances: Secondary | ICD-10-CM | POA: Diagnosis not present

## 2022-10-10 DIAGNOSIS — M47812 Spondylosis without myelopathy or radiculopathy, cervical region: Secondary | ICD-10-CM | POA: Diagnosis not present

## 2022-10-10 DIAGNOSIS — H5462 Unqualified visual loss, left eye, normal vision right eye: Secondary | ICD-10-CM | POA: Diagnosis present

## 2022-10-10 DIAGNOSIS — J439 Emphysema, unspecified: Secondary | ICD-10-CM | POA: Diagnosis present

## 2022-10-10 LAB — APTT: aPTT: 33 s (ref 24–36)

## 2022-10-10 LAB — COMPREHENSIVE METABOLIC PANEL
ALT: 24 U/L (ref 0–44)
AST: 21 U/L (ref 15–41)
Albumin: 3.2 g/dL — ABNORMAL LOW (ref 3.5–5.0)
Alkaline Phosphatase: 86 U/L (ref 38–126)
Anion gap: 12 (ref 5–15)
BUN: 29 mg/dL — ABNORMAL HIGH (ref 8–23)
CO2: 22 mmol/L (ref 22–32)
Calcium: 8.3 mg/dL — ABNORMAL LOW (ref 8.9–10.3)
Chloride: 101 mmol/L (ref 98–111)
Creatinine, Ser: 1.43 mg/dL — ABNORMAL HIGH (ref 0.61–1.24)
GFR, Estimated: 53 mL/min — ABNORMAL LOW (ref >60)
Glucose, Bld: 87 mg/dL (ref 70–99)
Potassium: 2.5 mmol/L — CL (ref 3.5–5.1)
Sodium: 135 mmol/L (ref 135–145)
Total Bilirubin: 0.7 mg/dL (ref 0.3–1.2)
Total Protein: 6.2 g/dL — ABNORMAL LOW (ref 6.5–8.1)

## 2022-10-10 LAB — BASIC METABOLIC PANEL
Anion gap: 9 (ref 5–15)
Anion gap: 8 (ref 5–15)
BUN: 23 mg/dL (ref 8–23)
BUN: 21 mg/dL (ref 8–23)
CO2: 21 mmol/L — ABNORMAL LOW (ref 22–32)
CO2: 19 mmol/L — ABNORMAL LOW (ref 22–32)
Calcium: 7.7 mg/dL — ABNORMAL LOW (ref 8.9–10.3)
Calcium: 7.8 mg/dL — ABNORMAL LOW (ref 8.9–10.3)
Chloride: 104 mmol/L (ref 98–111)
Chloride: 106 mmol/L (ref 98–111)
Creatinine, Ser: 1.05 mg/dL (ref 0.61–1.24)
Creatinine, Ser: 0.99 mg/dL (ref 0.61–1.24)
GFR, Estimated: >60 mL/min (ref >60)
GFR, Estimated: >60 mL/min (ref >60)
Glucose, Bld: 110 mg/dL — ABNORMAL HIGH (ref 70–99)
Glucose, Bld: 92 mg/dL (ref 70–99)
Potassium: 3.2 mmol/L — ABNORMAL LOW (ref 3.5–5.1)
Potassium: 3 mmol/L — ABNORMAL LOW (ref 3.5–5.1)
Sodium: 134 mmol/L — ABNORMAL LOW (ref 135–145)
Sodium: 133 mmol/L — ABNORMAL LOW (ref 135–145)

## 2022-10-10 LAB — URINALYSIS, ROUTINE W REFLEX MICROSCOPIC
Bilirubin Urine: NEGATIVE
Glucose, UA: NEGATIVE mg/dL
Hgb urine dipstick: NEGATIVE
Ketones, ur: NEGATIVE mg/dL
Leukocytes,Ua: NEGATIVE
Nitrite: NEGATIVE
Protein, ur: NEGATIVE mg/dL
Specific Gravity, Urine: 1.018 (ref 1.005–1.030)
pH: 5 (ref 5.0–8.0)

## 2022-10-10 LAB — CBC WITH DIFFERENTIAL/PLATELET
Abs Immature Granulocytes: 0.05 10*3/uL (ref 0.00–0.07)
Basophils Absolute: 0.1 10*3/uL (ref 0.0–0.1)
Basophils Relative: 1 %
Eosinophils Absolute: 0.1 10*3/uL (ref 0.0–0.5)
Eosinophils Relative: 2 %
HCT: 28.6 % — ABNORMAL LOW (ref 39.0–52.0)
Hemoglobin: 10.4 g/dL — ABNORMAL LOW (ref 13.0–17.0)
Immature Granulocytes: 1 %
Lymphocytes Relative: 24 %
Lymphs Abs: 1.5 10*3/uL (ref 0.7–4.0)
MCH: 32.8 pg (ref 26.0–34.0)
MCHC: 36.4 g/dL — ABNORMAL HIGH (ref 30.0–36.0)
MCV: 90.2 fL (ref 80.0–100.0)
Monocytes Absolute: 0.3 10*3/uL (ref 0.1–1.0)
Monocytes Relative: 5 %
Neutro Abs: 4.1 10*3/uL (ref 1.7–7.7)
Neutrophils Relative %: 67 %
Platelets: 235 10*3/uL (ref 150–400)
RBC: 3.17 MIL/uL — ABNORMAL LOW (ref 4.22–5.81)
RDW: 14.6 % (ref 11.5–15.5)
WBC: 6.1 10*3/uL (ref 4.0–10.5)
nRBC: 0 % (ref 0.0–0.2)

## 2022-10-10 LAB — CBG MONITORING, ED
Glucose-Capillary: 74 mg/dL (ref 70–99)
Glucose-Capillary: 58 mg/dL — ABNORMAL LOW (ref 70–99)
Glucose-Capillary: 93 mg/dL (ref 70–99)
Glucose-Capillary: 105 mg/dL — ABNORMAL HIGH (ref 70–99)
Glucose-Capillary: 132 mg/dL — ABNORMAL HIGH (ref 70–99)
Glucose-Capillary: 117 mg/dL — ABNORMAL HIGH (ref 70–99)

## 2022-10-10 LAB — URINE DRUG SCREEN, QUALITATIVE (ARMC ONLY)
Amphetamines, Ur Screen: NOT DETECTED
Barbiturates, Ur Screen: NOT DETECTED
Benzodiazepine, Ur Scrn: NOT DETECTED
Cannabinoid 50 Ng, Ur ~~LOC~~: NOT DETECTED
Cocaine Metabolite,Ur ~~LOC~~: NOT DETECTED
MDMA (Ecstasy)Ur Screen: NOT DETECTED
Methadone Scn, Ur: NOT DETECTED
Opiate, Ur Screen: NOT DETECTED
Phencyclidine (PCP) Ur S: NOT DETECTED
Tricyclic, Ur Screen: NOT DETECTED

## 2022-10-10 LAB — PROTIME-INR
INR: 1.2 (ref 0.8–1.2)
Prothrombin Time: 15.4 s — ABNORMAL HIGH (ref 11.4–15.2)

## 2022-10-10 LAB — TROPONIN I (HIGH SENSITIVITY)
Troponin I (High Sensitivity): 9 ng/L (ref <18)
Troponin I (High Sensitivity): 9 ng/L (ref <18)

## 2022-10-10 LAB — GLUCOSE, CAPILLARY: Glucose-Capillary: 116 mg/dL — ABNORMAL HIGH (ref 70–99)

## 2022-10-10 LAB — MAGNESIUM: Magnesium: 1.8 mg/dL (ref 1.7–2.4)

## 2022-10-10 LAB — BRAIN NATRIURETIC PEPTIDE: B Natriuretic Peptide: 27.8 pg/mL (ref 0.0–100.0)

## 2022-10-10 LAB — PHOSPHORUS: Phosphorus: 4 mg/dL (ref 2.5–4.6)

## 2022-10-10 LAB — SARS CORONAVIRUS 2 BY RT PCR: SARS Coronavirus 2 by RT PCR: NEGATIVE

## 2022-10-10 LAB — ETHANOL: Alcohol, Ethyl (B): <10 mg/dL (ref <10)

## 2022-10-10 LAB — PROCALCITONIN: Procalcitonin: 0.78 ng/mL

## 2022-10-10 LAB — LACTIC ACID, PLASMA: Lactic Acid, Venous: 1.3 mmol/L (ref 0.5–1.9)

## 2022-10-10 LAB — MRSA NEXT GEN BY PCR, NASAL: MRSA by PCR Next Gen: NOT DETECTED

## 2022-10-10 LAB — SEDIMENTATION RATE: Sed Rate: 30 mm/h — ABNORMAL HIGH (ref 0–20)

## 2022-10-10 LAB — CORTISOL: Cortisol, Plasma: 10.2 ug/dL

## 2022-10-10 MED ORDER — SODIUM CHLORIDE 0.9 % IV SOLN: 250.0000 mL | INTRAVENOUS | Status: DC

## 2022-10-10 MED ORDER — NOREPINEPHRINE 4 MG/250ML-% IV SOLN
INTRAVENOUS | Status: AC
  Administered 2022-10-10: 4 mg via INTRAVENOUS
  Filled 2022-10-10: qty 250

## 2022-10-10 MED ORDER — DEXTROSE 50 % IV SOLN: 50.0000 mL | Freq: Once | INTRAVENOUS | Status: AC

## 2022-10-10 MED ORDER — POLYETHYLENE GLYCOL 3350 17 G PO PACK: 17.0000 g | PACK | Freq: Every day | ORAL | Status: DC | PRN

## 2022-10-10 MED ORDER — POTASSIUM CHLORIDE 10 MEQ/100ML IV SOLN: 10.0000 meq | INTRAVENOUS | Status: DC

## 2022-10-10 MED ORDER — POTASSIUM CHLORIDE 10 MEQ/100ML IV SOLN
10.0000 meq | INTRAVENOUS | Status: AC
  Administered 2022-10-10 (×3): 10 meq via INTRAVENOUS
  Filled 2022-10-10 (×3): qty 100

## 2022-10-10 MED ORDER — IOHEXOL 350 MG/ML SOLN
100.0000 mL | Freq: Once | INTRAVENOUS | Status: AC | PRN
  Administered 2022-10-10: 100 mL via INTRAVENOUS

## 2022-10-10 MED ORDER — DEXTROSE 50 % IV SOLN
INTRAVENOUS | Status: AC
  Administered 2022-10-10: 50 mL via INTRAVENOUS
  Filled 2022-10-10: qty 50

## 2022-10-10 MED ORDER — SODIUM CHLORIDE 0.9 % IV BOLUS
1000.0000 mL | Freq: Once | INTRAVENOUS | Status: AC
  Administered 2022-10-10: 1000 mL via INTRAVENOUS

## 2022-10-10 MED ORDER — DOCUSATE SODIUM 100 MG PO CAPS: 100.0000 mg | ORAL_CAPSULE | Freq: Two times a day (BID) | ORAL | Status: DC | PRN

## 2022-10-10 MED ORDER — SODIUM CHLORIDE 0.9 % IV SOLN
2.0000 g | Freq: Two times a day (BID) | INTRAVENOUS | Status: DC
  Administered 2022-10-10: 2 g via INTRAVENOUS
  Filled 2022-10-10 (×2): qty 12.5

## 2022-10-10 MED ORDER — MORPHINE SULFATE (PF) 2 MG/ML IV SOLN
0.5000 mg | INTRAVENOUS | Status: DC | PRN
  Administered 2022-10-10 – 2022-10-11 (×4): 1 mg via INTRAVENOUS
  Filled 2022-10-10 (×4): qty 1

## 2022-10-10 MED ORDER — LACTATED RINGERS IV SOLN: INTRAVENOUS | Status: DC

## 2022-10-10 MED ORDER — VANCOMYCIN HCL IN DEXTROSE 1-5 GM/200ML-% IV SOLN
1000.0000 mg | INTRAVENOUS | Status: DC
  Filled 2022-10-10: qty 200

## 2022-10-10 MED ORDER — OXYCODONE-ACETAMINOPHEN 5-325 MG PO TABS
1.0000 | ORAL_TABLET | Freq: Four times a day (QID) | ORAL | Status: DC | PRN
  Administered 2022-10-10: 1 via ORAL
  Filled 2022-10-10: qty 1

## 2022-10-10 MED ORDER — NOREPINEPHRINE 4 MG/250ML-% IV SOLN
0.0000 ug/min | INTRAVENOUS | Status: DC
  Administered 2022-10-10: 2 ug/min via INTRAVENOUS

## 2022-10-10 MED ORDER — ENSURE ENLIVE PO LIQD: 237.0000 mL | Freq: Two times a day (BID) | ORAL | Status: DC

## 2022-10-10 MED ORDER — POTASSIUM CHLORIDE 10 MEQ/100ML IV SOLN
10.0000 meq | INTRAVENOUS | Status: AC
  Administered 2022-10-10 (×2): 10 meq via INTRAVENOUS
  Filled 2022-10-10: qty 100

## 2022-10-10 MED ORDER — SODIUM CHLORIDE 0.9 % IV BOLUS: 500.0000 mL | Freq: Once | INTRAVENOUS | Status: DC

## 2022-10-10 MED ORDER — HEPARIN SODIUM (PORCINE) 5000 UNIT/ML IJ SOLN
5000.0000 [IU] | Freq: Three times a day (TID) | INTRAMUSCULAR | Status: DC
  Administered 2022-10-10 – 2022-10-11 (×3): 5000 [IU] via SUBCUTANEOUS
  Filled 2022-10-10 (×3): qty 1

## 2022-10-10 MED ORDER — LACTATED RINGERS IV BOLUS
500.0000 mL | Freq: Once | INTRAVENOUS | Status: AC
  Administered 2022-10-10: 500 mL via INTRAVENOUS

## 2022-10-10 MED ORDER — DEXTROSE 5 % IV BOLUS
500.0000 mL | Freq: Once | INTRAVENOUS | Status: AC
  Administered 2022-10-10: 500 mL via INTRAVENOUS
  Filled 2022-10-10: qty 500

## 2022-10-10 MED ORDER — VANCOMYCIN VARIABLE DOSE PER UNSTABLE RENAL FUNCTION (PHARMACIST DOSING): Status: DC

## 2022-10-10 MED ORDER — SODIUM CHLORIDE 0.9 % IV SOLN
2.0000 g | Freq: Once | INTRAVENOUS | Status: AC
  Administered 2022-10-10: 2 g via INTRAVENOUS
  Filled 2022-10-10: qty 12.5

## 2022-10-10 MED ORDER — CHLORHEXIDINE GLUCONATE CLOTH 2 % EX PADS: 6.0000 | MEDICATED_PAD | Freq: Every day | CUTANEOUS | Status: DC

## 2022-10-10 MED ORDER — METRONIDAZOLE 500 MG/100ML IV SOLN
500.0000 mg | Freq: Once | INTRAVENOUS | Status: AC
  Administered 2022-10-10: 500 mg via INTRAVENOUS
  Filled 2022-10-10: qty 100

## 2022-10-10 MED ORDER — NOREPINEPHRINE 4 MG/250ML-% IV SOLN: 0.0000 ug/min | INTRAVENOUS | Status: DC

## 2022-10-10 MED ORDER — SODIUM CHLORIDE 0.9 % IV BOLUS
500.0000 mL | Freq: Once | INTRAVENOUS | Status: AC
  Administered 2022-10-10: 500 mL via INTRAVENOUS

## 2022-10-10 MED ORDER — VANCOMYCIN HCL 1500 MG/300ML IV SOLN
1500.0000 mg | Freq: Once | INTRAVENOUS | Status: AC
  Administered 2022-10-10: 1500 mg via INTRAVENOUS
  Filled 2022-10-10: qty 300

## 2022-10-10 MED ORDER — POTASSIUM CHLORIDE CRYS ER 20 MEQ PO TBCR
40.0000 meq | EXTENDED_RELEASE_TABLET | Freq: Once | ORAL | Status: AC
  Administered 2022-10-10: 40 meq via ORAL
  Filled 2022-10-10: qty 2

## 2022-10-10 NOTE — Consult Note (Signed)
[facility] Consult Services   Patient Name:   Jabali, Everage Date of Birth:   1950/02/26 Identification Number:   MRN - [id number] Date of Service:   10/10/2022 07:38:26  Diagnosis:       G93.41 - Encephalopathy Metabolic  Impression:      Pt with a h/o h/o prior stroke, HTN, HLD, DM, CAD, PE presents with fall, decreased responsiveness and hypoglycemia this morning. He is now verbal, states name and month, but not fully following commands. Moves all extremities. DDx includes encaphalopathy vs stroke vs recrudescence prior stroke symptoms.  Out of time window for thrombolytics and no LVO on imaging. (A2 stenosis and distal thrombosis of unclear chronicity and too distal for intervention)  Recommend medical workup, dual antiplatelets as outlined below, high dose statin given intracranial stenosis/thrombosis noted on CTAs.  Our recommendations are outlined below.  Recommendations:        Neuro Checks       Bedside Swallow Eval       DVT Prophylaxis       Euglycemia and Avoid Hyperthermia (PRN Acetaminophen)       Initiate dual antiplatelet therapy with Aspirin 81 mg daily and Clopidogrel 75 mg daily.       Antihypertensives PRN if Blood pressure is greater than 220/120 or there is a concern for End organ damage/contraindications for permissive HTN. If blood pressure is greater than 220/120 give labetalol PO or IV or Vasotec IV with a goal of 15% reduction in BP during the first 24 hours.  Sign Out:       Discussed with Emergency Department Provider    ------------------------------------------------------------------------------  Advanced Imaging: CTA Head and Neck Completed.  CTP Completed.  LVO:No  Patient in not a candidate for NIR   Metrics: Last Known Well: 10/09/2022 22:00:00 [facility] Notification Time: 10/10/2022 07:38:26 Arrival Time: 10/10/2022 06:10:00 Stamp Time: 10/10/2022 07:38:26 Initial Response Time: 10/10/2022  07:42:05 Symptoms: confusion, nonverbal . Initial patient interaction: 10/10/2022 08:00:01 NIHSS Assessment Completed: 10/10/2022 08:06:10 Patient is not a candidate for Thrombolytic. Thrombolytic Medical Decision: 10/10/2022 08:06:20 Patient was not deemed candidate for Thrombolytic because of following reasons: LKW outside 4.5 hr window. .  CT head showed no acute hemorrhage or acute core infarct. I personally Reviewed the CT Head and it Showed  Primary Provider Notified of Diagnostic Impression and Management Plan on: 10/10/2022 08:30:41    ------------------------------------------------------------------------------  History of Present Illness: Patient is a 72 year old Male.  Patient was brought by EMS for symptoms of confusion, nonverbal . The patient was last normal at 22:00 last night, his wife got up this morning and he was still asleep. She heard him fall and found him on the ground with slurred speech, not following commands. On initial arrival BS was 64 and then 58. He was given an amp of D50, now improved to 93. He was unresponsive with EMS, long periods of apnea. He has a h/o prior stroke, HTN, HLD, DM, CAD, PE.    Past Medical History:      Hypertension      Diabetes Mellitus      Hyperlipidemia      Coronary Artery Disease      Stroke  Medications:  No Anticoagulant use  Antiplatelet use: Yes asa Reviewed EMR for current medications  Allergies:  Reviewed  Social History: Smoking: No Alcohol Use: No Drug Use: No  Family History:  There is no family history of premature cerebrovascular disease pertinent to this consultation  ROS : 14  Points Review of Systems was performed and was negative except mentioned in HPI.  Past Surgical History: There Is No Surgical History Contributory To Today's Visit     Examination: BP(101/64), Pulse(65), Blood Glucose(93) 1A: Level of Consciousness - Requires repeated stimulation to arouse + 2 1B: Ask Month and  Age - 1 Question Right + 1 1C: Blink Eyes & Squeeze Hands - Performs 0 Tasks + 2 2: Test Horizontal Extraocular Movements - Normal + 0 3: Test Visual Fields - No Visual Loss + 0 4: Test Facial Palsy (Use Grimace if Obtunded) - Normal symmetry + 0 5A: Test Left Arm Motor Drift - Some Effort Against Gravity + 2 5B: Test Right Arm Motor Drift - Some Effort Against Gravity + 2 6A: Test Left Leg Motor Drift - No Effort Against Gravity + 3 6B: Test Right Leg Motor Drift - No Effort Against Gravity + 3 7: Test Limb Ataxia (FNF/Heel-Shin) - Does Not Understand + 0 8: Test Sensation - Normal; No sensory loss + 0 9: Test Language/Aphasia - Normal; No aphasia + 0 10: Test Dysarthria - Mild-Moderate Dysarthria: Slurring but can be understood + 1 11: Test Extinction/Inattention - No abnormality + 0  NIHSS Score: 16   Pre-Morbid Modified Rankin Scale: 0 Points = No symptoms at all  Spoke with : Shaune Pollack, MD  This consult was conducted in real time using interactive audio and Immunologist. Patient was informed of the technology being used for this visit and agreed to proceed. Patient located in hospital and provider located at home/office setting.   Patient is being evaluated for possible acute neurologic impairment and high probability of imminent or life-threatening deterioration. I spent total of 43 minutes providing care to this patient, including time for face to face visit via telemedicine, review of medical records, imaging studies and discussion of findings with providers, the patient and/or family.   Dr Rutherford Guys   [facility] For Inpatient follow-up with [facility] physician please call [facility] [phone]. This is not an outpatient service. Post hospital discharge, please contact hospital directly.  Please do not communicate with [facility] physicians via secure chat. If you have any questions, Please contact [facility]. Please call or reconsult our service if  there are any clinical or diagnostic changes.

## 2022-10-10 NOTE — ED Provider Notes (Signed)
Patient care assumed at 7:30 AM. Pt presented with acute altered mental status, slurred speech.  He was activated as a code stroke.  Broad imaging obtained due to unclear etiology of his acute encephalopathy.  Lab work thus far shows likely AKI, hypokalemia, and hyperglycemia which was repleted with D50.  Broad imaging shows chronic ACA occlusion, no LVO, otherwise CT imaging of the chest, abdomen, and pelvis is unremarkable.  Clinically, patient noted to become hypotensive, and I suspect he likely has recrudescence of old stroke like deficits in the setting of dehydration and possible UTI.  He had significant urinary retention.  He has been having recurrent UTIs as well as left sided endophthalmitis for which she is currently being referred to [facility] for [facility].  Patient will be started on broad-spectrum antibiotics, admitted.  30 cc/kg fluid boluses started for his hypotension.  Pt persistently hypotensive despite fluid resuscitation as above. Etiology remains somewhat unclear. Will admit to the ICU.   Marland KitchenCritical Care  Performed by: Shaune Pollack, MD Authorized by: Shaune Pollack, MD   Critical care provider statement:    Critical care time (minutes):  30   Critical care was necessary to treat or prevent imminent or life-threatening deterioration of the following conditions:  Sepsis, shock, cardiac failure and circulatory failure   Critical care was time spent personally by me on the following activities:  Development of treatment plan with patient or surrogate, discussions with consultants, evaluation of patient's response to treatment, examination of patient, ordering and review of laboratory studies, ordering and review of radiographic studies, ordering and performing treatments and interventions, pulse oximetry, re-evaluation of patient's condition and review of old charts     Shaune Pollack, MD 10/10/22 2030

## 2022-10-10 NOTE — Consult Note (Signed)
Pharmacy Consult Note - Electrolytes  Sodium (mmol/L)  Date Value  10/10/2022 133 (L)  01/05/2014 132 (L)   Potassium (mmol/L)  Date Value  10/10/2022 3.0 (L)  01/05/2014 3.9   Magnesium (mg/dL)  Date Value  16/10/9602 1.8   Calcium (mg/dL)  Date Value  54/09/8117 7.8 (L)   Calcium, Total (mg/dL)  Date Value  14/78/2956 9.2   Albumin (g/dL)  Date Value  21/30/8657 3.2 (L)  01/05/2014 2.3 (L)   Phosphorus (mg/dL)  Date Value  84/69/6295 4.0    ASSESSMENT: 72 y.o. male with PMH recent left eye endophthalmitis s/p vitrectomy and washout (08/2022), diabetic retinopathy, CAD, CHF who presents with sepsis. Wife heard thump in bathroom, found patient mumbling and when EMS arrived he was satting in the 55s. Patient being admitted to CCU with vasopressor requirements. Pharmacy has been consulted to monitor and replace electrolytes. Patient's renal function appears to be in AKI, up from baseline Scr 1.0-1.1.  Lab Results  Component Value Date   CREATININE 0.99 10/10/2022   CREATININE 1.05 10/10/2022   CREATININE 1.43 (H) 10/10/2022    mIVF: LR @ 100 mL/hr  Pertinent medications: N/A  Goal of Therapy:  [x]  Electrolytes WNL []  K >=4.0 and Mg >=2.0  PLAN: IV potassium chloride 10 mEq x 4 Check electrolytes with next AM labs   Thank you for allowing pharmacy to be a part of this patient's care.  Burnis Medin, PharmD, BCPS Clinical Pharmacist 10/10/2022 5:55 PM

## 2022-10-10 NOTE — Consult Note (Addendum)
Pharmacy Antibiotic Note  ASSESSMENT: 72 y.o. male with PMH recent left eye endophthalmitis (08/2022), diabetic retinopathy is presenting with sepsis. Pharmacy has been consulted to manage cefepime and vancomycin dosing. Wife heard thump in bathroom, found patient mumbling and when EMS arrived he was satting in the 57s. Patient being admitted to CCU with vasopressor requirements.  He received one-time doses of cefepime and vancomycin (and metronidazole) in the ED.  Recent history of left eye endophthalmitis after vitreous hemorrhage: Underwent vitrectomy and therapeutic injections of vancomycin and ceftazidime in OR on 08/27/2022. Fluffy white purulent material removed intraoperatively. Nothing grew from left eye fluid culture but urine culture grew E. Faecalis, sensitive to ampicillin, vancomycin, and nitrofurantoin. Postoperative antibiotic eyedrop regimen: cyclopentolate, prednisolosone, gatifloxacin drops. Also oral linezolid and ciprofloxacin were added.  Patient measurements: Weight: 70.1 kg (154 lb 8.7 oz)  Vital signs: Temp: 97.5 F (36.4 C) (10/09 1015) Temp Source: Oral (10/09 1015) BP: 103/62 (10/09 1035) Pulse Rate: 69 (10/09 0845) Recent Labs  Lab 10/10/22 0625  WBC 6.1  CREATININE 1.43*   Estimated Creatinine Clearance: 47.7 mL/min (A) (by C-G formula based on SCr of 1.43 mg/dL (H)).  Allergies: Allergies  Allergen Reactions   Clonidine Derivatives     Hallucinations    Fioricet [Butalbital-Apap-Caffeine]     hallucination   Hydrocodone-Acetaminophen     hallucinations   Mobic [Meloxicam]     hallucinations   Norvasc [Amlodipine Besylate]     hallucinations   Statins     Muscle cramps   Tramadol Itching    Antimicrobials this admission: Cefepime 10/9 >>  Vancomycin 10/9 >>  Dose adjustments this admission: N/A  Microbiology results: 10/9 Bcx: sent 10/9 Ucx: sent 10/9 COVID PCR: negative  PLAN: Initiate cefepime 2 g IV q12H Initiate vancomycin  1000 mg IV q24H eAUC 451, Cmax 30, Cmin 11 Scr 1.43, TBW, Vd 0.72 Half-life with current renal function estimated at 16 hours, so will order 12 hour vancomycin level for monitoring. Since we are sending out this lab, anticipating that level will return tomorrow AM. In the meantime, I will order vancomycin 1000 mg q24H to ensure patient receives vancomycin in the event the level is delayed. Follow up culture results to assess for antibiotic optimization. Monitor renal function to assess for any necessary antibiotic dosing changes.   Thank you for allowing pharmacy to be a part of this patient's care.  Will M. Dareen Piano, PharmD Clinical Pharmacist 10/10/2022 10:52 AM

## 2022-10-10 NOTE — Consult Note (Signed)
Pharmacy Consult Note - Electrolytes  Sodium (mmol/L)  Date Value  10/10/2022 135  01/05/2014 132 (L)   Potassium (mmol/L)  Date Value  10/10/2022 2.5 (LL)  01/05/2014 3.9   Magnesium (mg/dL)  Date Value  16/10/9602 1.8   Calcium (mg/dL)  Date Value  54/09/8117 8.3 (L)   Calcium, Total (mg/dL)  Date Value  14/78/2956 9.2   Albumin (g/dL)  Date Value  21/30/8657 3.2 (L)  01/05/2014 2.3 (L)    ASSESSMENT: 72 y.o. male with PMH recent left eye endophthalmitis s/p vitrectom6y and washout (08/2022), diabetic retinopathy, CAD, CHF who presents with sepsis. Wife heard thump in bathroom, found patient mumbling and when EMS arrived he was satting in the 21s. Patient being admitted to CCU with vasopressor requirements. Pharmacy has been consulted to monitor and replace electrolytes. Patient's renal function appears to be in AKI, up from baseline Scr 1.0-1.1.  Lab Results  Component Value Date   CREATININE 1.43 (H) 10/10/2022   CREATININE 1.27 (H) 08/29/2022   CREATININE 1.19 08/27/2022    mIVF: LR @ 100 mL/hr  Pertinent medications: N/A  Goal of Therapy:  [x]  Electrolytes WNL []  K >=4.0 and Mg >=2.0  PLAN: K 2.5 >> potassium chloride 10 mEq x 2 ordered by provider Will order additional 10 mEq x 4 and recheck BMP at 1700 Will add on phosphorus check for today Check electrolytes with next AM labs   Thank you for allowing pharmacy to be a part of this patient's care.  Will M. Dareen Piano, PharmD Clinical Pharmacist 10/10/2022 12:02 PM

## 2022-10-10 NOTE — H&P (Signed)
NAME:  Michael Humphrey, MRN:  [id number], DOB:  Nov 19, 1950, LOS: 0 ADMISSION DATE:  10/10/2022, CONSULTATION DATE:  10/10/2022  REFERRING MD:  Erma Heritage, MD, CHIEF COMPLAINT:  Fall, AMS  Brief Pt Description / Synopsis:  72 year old male with past medical history significant for recent recurrent left eye endophthalmitis and Enterococcus faecalis UTI admitted with acute metabolic encephalopathy status post fall, undifferentiated shock, along with concern for sepsis currently of unknown etiology, and AKI on CKD.  History of Present Illness:  Michael Humphrey is a 72 year old male who presented to the ED s/p fall with Altered Mental Status at home this morning. Past medical history significant for HTN, CAD, DM, HLD, and left eye endophthalmitis. Per wife report, his last known well time was around 10:00 PM last night, and this morning upon awakening she heard the patient fall in other room, and is unsure whether he tripped or had a syncopal episode. EMS report states upon arrival, O2 sats were in 50s and patient was unresponsive and experiencing periods of apnea. Patient reports pain in left eye and neck tenderness.    His wife reports he seemed more confused yesterday evening, but she denies knowledge of recent fevers, chills, headache, chest pain, SOB, abdominal pain, N/V/D, dysuria, wounds. She does report poor intake and weight loss of approximately 20 to 30 pounds over the past few months, and that he has had significant urinary retention.  Of note the patient was recently treated at [facility] on 08/27/22 for left endophthalmitis on 08/27/2022 of which he underwent surgical Vitrectomy.  He was also found to have Enterococcus Faecalis UTI.   He then returned to the ED at [facility] on 09/01/2022 with persistent left eye pain and headache.  CT imaging without abscess.  He was offered Surgery on 9/3 for [facility] washout and PPV but he ended up declining surgery. There was concern for undiagnosed  systemic infection, therefore he was discharged on PO Linezolid and with PICC line for IV ceftriaxone (completion date 9/16).  He has been following closely with ophthalmology and infectious disease with tentative plan for removal of left eye on Friday 10/12/22 at [facility].  ED Course: Initial Vital Signs: Temperature 97.6 F, pulse 66, respiratory rate 26, blood pressure 115/75, SpO2 100% on room air Significant Labs: Potassium 2.5, BUN 29, creatinine 1.43, BNP 27, high-sensitivity troponin 9, lactic acid 1.3, procalcitonin 0.78, WBC 6.1, hemoglobin 10.4, hematocrit 28.6 COVID-19 PCR is negative Urinalysis not consistent with UTI Imaging CT Head w/o Contrast>>IMPRESSION: 1. No acute intracranial abnormality or acute traumatic injury identified. CT Cervical Spine>>IMPRESSION: 1. No acute traumatic injury identified in the cervical spine. 2. 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 CT Maxillofacial>>IMPRESSION: 1. Mild acute right nasal bone fracture. 2. No other acute traumatic injury identified in the Face. Chest X-ray>>IMPRESSION: No acute cardiopulmonary abnormality or acute traumatic injury identified. CT Angio Chest>>IMPRESSION: No evidence of pulmonary embolism. Mild cardiomegaly with small pericardial effusion. CT Abdomen & Pelvis>>Left colonic diverticulosis, without evidence of diverticulitis.  Medications Administered: 2 L of IV fluid boluses, 500 cc D5 bolus, broad-spectrum antibiotics including cefepime, Flagyl, vancomycin, Levophed drip initiated  After arrival in the ED he was noted to be verbal stating his name and month, but not following commands.  Code stroke was initiated and teleneurology evaluated him.  He was deemed out of the time window for thrombolytics and there was no large vessel occlusion on imaging.  He was recommended for medical  workup, dual antiplatelet therapy, and high-dose statin  therapy given intracranial stenosis/thrombosis noted on CTA's. While in ED he became hypoglycemic and hypotensive was given D50 and IV fluid resuscitation.  Despite fluids he remained hypotensive requiring initiation of peripheral Levophed.  PCCM asked to admit for further workup and treatment.  Please see "significant hospital events" section below for full detailed hospital course.   Pertinent  Medical History  CAD Hypertension T2DM Hyperlipidemia Hx left eye endophthalmitis CVA (2019) Hx pulmonary embolism   Micro Data:  10/9: SARS-CoV-2 PCR>> negative 10/9: Blood culture x 2>> 10/9: Urine>>  Antimicrobials:   Anti-infectives (From admission, onward)    Start     Dose/Rate Route Frequency Ordered Stop   10/11/22 1000  vancomycin (VANCOCIN) IVPB 1000 mg/200 mL premix        1,000 mg 200 mL/hr over 60 Minutes Intravenous Every 24 hours 10/10/22 1137     10/10/22 2200  ceFEPIme (MAXIPIME) 2 g in sodium chloride 0.9 % 100 mL IVPB        2 g 200 mL/hr over 30 Minutes Intravenous Every 12 hours 10/10/22 1123     10/10/22 1150  vancomycin variable dose per unstable renal function (pharmacist dosing)         Does not apply See admin instructions 10/10/22 1150     10/10/22 0900  ceFEPIme (MAXIPIME) 2 g in sodium chloride 0.9 % 100 mL IVPB        2 g 200 mL/hr over 30 Minutes Intravenous  Once 10/10/22 0848 10/10/22 0935   10/10/22 0900  vancomycin (VANCOREADY) IVPB 1500 mg/300 mL        1,500 mg 150 mL/hr over 120 Minutes Intravenous  Once 10/10/22 0857 10/10/22 1221   10/10/22 0845  metroNIDAZOLE (FLAGYL) IVPB 500 mg        500 mg 100 mL/hr over 60 Minutes Intravenous  Once 10/10/22 0832 10/10/22 0950       Significant Hospital Events: Including procedures, antibiotic start and stop dates in addition to other pertinent events   10/9: Presented to ED with AMS s/p fall.  Hypotensive despite IVF requiring low dose peripheral Levophed.  PCCM asked to admit.  ID consulted for  concern for sepsis of unknown etiology.  Interim History / Subjective:  -Patient seen in the ED, wife reports his mentation has improved somewhat since arrival to the ED -Patient sleeping, arouses easily to voice, disoriented but able to move all extremities to commands, no focal deficits, able to move neck -Remained hypotensive following 2 L IV fluid resuscitation, therefore was started on peripheral Levophed by ED provider, currently at 5 mics -Concern for sepsis of unknown etiology~cover broadly, cultures pending, consult ID -Currently protecting his airway  Objective   Blood pressure 103/62, pulse 69, temperature (!) 97.5 F (36.4 C), temperature source Oral, resp. rate (!) 23, weight 70.1 kg, SpO2 95%.        Intake/Output Summary (Last 24 hours) at 10/10/2022 1115 Last data filed at 10/10/2022 1017 Gross per 24 hour  Intake 2850 ml  Output --  Net 2850 ml   Filed Weights   10/10/22 0945 10/10/22 1010  Weight: 70.1 kg 70.1 kg    Examination: General: Acute on chronically ill-appearing frail elderly male, alert and oriented, laying in bed, no apparent distress.  HENT: Periorbital ecchymosis, orbital edema, clear left eye drainage.  Lungs: Normal effort, lung sounds clear, even, nonlabored Cardiovascular: Regular rate and rhythm, S1-S2, no murmurs, rubs, or gallops Abdomen: Soft, flat, non-distended. No  tenderness.  No guarding or rebound tenderness, bowel sounds positive x 4 Extremities: BLE edema Neuro: Alert and oriented.  Moves all extremities to commands, no focal deficits, bilateral upper extremities motion limited due to pain GU: Clear, yellow urine. FC in place. No hematuria.   Resolved Hospital Problem list     Assessment & Plan:   #Undifferentiated Shock: Hypovolemic vs. ? Septic vs ? Obstructive #Small Pericardial Effusion  PMHx: CAD, HTN, HLD Echocardiogram 09/04/22: LVEF 55-60%, LV size normal, RV not well visualized, small pericardial effusion -Continuous  cardiac monitoring -Maintain MAP >65 -IV fluids -Vasopressors as needed to maintain MAP goal -Lactic acid is normalized -HS Troponin negative x2 -Cortisol is pending -Repeat Echocardiogram pending to follow up on Pericardial Effusion -Hold home Coreg, Lisinopril, and Torsemide for now  #Concern for Sepsis of unknown etiology #Recent history of recurrent Left Eye Endophthalmitis & Enterococcus Faecalis UTI ~ PICC line and IV Ceftriaxone completed on 9/16 CT Angio chest: no pneumonia CT Abdomen/Pelvis: mild left colonic diverticulosis without evidence of diverticulitis  UA without evidence of UTI -Monitor fever curve -Trend WBC's & Procalcitonin -Follow cultures as above -ID consulted, appreciate input ~ Continue empiric Cefepime and Vancomycin pending cultures & sensitivities   #Acute Hypoxic Respiratory Failure in setting of Acute Metabolic Encephalopathy and multiple metabolic derangements PMHx: OSA CT Angio chest: negative for PE, mild cardiomegaly, emphysema, mild dependent atelectasis in bilateral LL, no focal consolidation or aspiration -Supplemental O2 as needed to maintain O2 sats >92% -BiPAP at bedtime and while napping -Follow intermittent Chest X-ray & ABG as needed -Bronchodilators prn -Pulmonary toilet as able   #AKI on CKD Stage III #Hypokalemia PMHx: Urinary retention -Monitor I&O's / urinary output ~ Foley catheter placed for retention -Follow BMP -Ensure adequate renal perfusion -Avoid nephrotoxic agents as able -Replace electrolytes as indicated ~ Pharmacy following for assistance with electrolyte replacement -IV fluids -Will hold home Flomax and Finasteride for now  #Hypoglycemia PMHx: Diabetes Mellitus -CBG's q4h; Target range of 140 to 180 -SSI -Follow ICU Hypo/Hyperglycemia protocol -Hold home Metformin and Glimepiride for now   #Acute Metabolic Encephalopathy #? Syncopal Episode PMHx: CVA Code Stroke called in ED: CT Head w/o contrast negative,  CT Cervial Spine negative for acute fracture, CT Angio Head and Neck negative for Large Vessel Occlussion ~ evaluated by TeleNeurology and deemed not a candidate for thrombolytics or thrombectomy -Treatment of metabolic derangements and sepsis as outlined above -Provide supportive care -Promote normal sleep/wake cycle and family presence -Avoid sedating medications as able -Check UDS -Consider EEG and MRI Brain      Best Practice (right click and "Reselect all SmartList Selections" daily)   Diet/type: NPO DVT prophylaxis: Heparin subcu GI prophylaxis: N/A Lines: N/A Foley:  Yes, and it is still needed Code Status:  full code Last date of multidisciplinary goals of care discussion [n/A]  10/9: Patient, his wife, and his wife sister updated at bedside on plan of care.  Labs   CBC: Recent Labs  Lab 10/10/22 0625  WBC 6.1  NEUTROABS 4.1  HGB 10.4*  HCT 28.6*  MCV 90.2  PLT 235    Basic Metabolic Panel: Recent Labs  Lab 10/10/22 0625  NA 135  K 2.5*  CL 101  CO2 22  GLUCOSE 87  BUN 29*  CREATININE 1.43*  CALCIUM 8.3*  MG 1.8   GFR: Estimated Creatinine Clearance: 47.7 mL/min (A) (by C-G formula based on SCr of 1.43 mg/dL (H)). Recent Labs  Lab 10/10/22 0625 10/10/22 0853  WBC  6.1  --   LATICACIDVEN  --  1.3    Liver Function Tests: Recent Labs  Lab 10/10/22 0625  AST 21  ALT 24  ALKPHOS 86  BILITOT 0.7  PROT 6.2*  ALBUMIN 3.2*   No results for input(s): "LIPASE", "AMYLASE" in the last 168 hours. No results for input(s): "AMMONIA" in the last 168 hours.  ABG    Component Value Date/Time   HCO3 25.7 10/10/2022 0800   TCO2 25 01/15/2020 1101   ACIDBASEDEF 1.2 10/10/2022 0800   O2SAT 15.9 10/10/2022 0800     Coagulation Profile: Recent Labs  Lab 10/10/22 0853  INR 1.2    Cardiac Enzymes: No results for input(s): "CKTOTAL", "CKMB", "CKMBINDEX", "TROPONINI" in the last 168 hours.  HbA1C: Hgb A1c MFr Bld  Date/Time Value Ref Range  Status  08/27/2022 03:47 PM 6.4 (H) 4.8 - 5.6 % Final    Comment:    (NOTE) Pre diabetes:          5.7%-6.4%  Diabetes:              >6.4%  Glycemic control for   <7.0% adults with diabetes   07/15/2017 12:49 PM 7.1 (H) 4.8 - 5.6 % Final    Comment:    (NOTE) Pre diabetes:          5.7%-6.4% Diabetes:              >6.4% Glycemic control for   <7.0% adults with diabetes     CBG: Recent Labs  Lab 10/10/22 0620 10/10/22 0721 10/10/22 0808 10/10/22 0850 10/10/22 0948  GLUCAP 74 58* 93 105* 132*    Review of Systems:   Positives in BOLD: Gen: Denies fever, chills, weight change, fatigue, night sweats HEENT: Reports neck tenderness/pain, L eye vision loss, and tearing. Denies blurred vision, double vision, hearing loss, tinnitus, sinus congestion, rhinorrhea, sore throat, neck stiffness, dysphagia PULM: Denies shortness of breath, cough, sputum production, hemoptysis, wheezing CV: Reports BLE edema. Denies chest pain, edema, orthopnea, paroxysmal nocturnal dyspnea, palpitations GI: Denies abdominal pain, nausea, vomiting, diarrhea, hematochezia, melena, constipation, change in bowel habits GU: Denies dysuria, hematuria, polyuria, oliguria, urethral discharge Endocrine: Denies hot or cold intolerance, polyuria, polyphagia or appetite change Derm: Denies rash, dry skin, scaling or peeling skin change Heme: Denies easy bruising, bleeding, bleeding gums Neuro: Reports headache and slurred speech. 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/2017), CVA (cerebral vascular accident) (HCC) (07/15/2017), DDD (degenerative disc disease), lumbar, Diabetic  retinopathy (HCC), DISH (diffuse idiopathic skeletal hyperostosis), Diverticulitis, Full dentures, GERD (gastroesophageal reflux disease), Heart attack (HCC), High cholesterol, Hordeolum internum right lower eyelid (12/09/2019), Hypertension, Incomplete right bundle branch block (RBBB), Lumbar radiculitis, Lumbar radiculopathy, Posterior capsular opacification, right (09/26/2020), Pulmonary embolism (HCC), Skin cancer, Sleep apnea, Spinal stenosis of lumbar region, unspecified whether neurogenic claudication present, Statin intolerance, T2DM (type 2 diabetes mellitus) (HCC), and Tubular adenoma of colon.   Surgical History:   Past Surgical History:  Procedure Laterality Date   AMPUTATION TOE Left 01/15/2020   Procedure: AMPUTATION TOE MPJ T1;  Surgeon: Gwyneth Revels, DPM;  Location: [facility] ORS;  Service: Podiatry;  Laterality: Left;   APPLICATION OF INTRAOPERATIVE CT SCAN N/A 03/19/2022   Procedure: APPLICATION OF INTRAOPERATIVE  CT SCAN;  Surgeon: Venetia Night, MD;  Location: [facility] ORS;  Service: Neurosurgery;  Laterality: N/A;   BUNIONECTOMY     CATARACT EXTRACTION W/PHACO Left 12/11/2017   Procedure: CATARACT EXTRACTION PHACO AND INTRAOCULAR LENS PLACEMENT (IOC)  LEFT DIABETIC;  Surgeon: Lockie Mola, MD;  Location: [facility];  Service: Ophthalmology;  Laterality: Left;  DIABETIC (ACTA picking pt up at 0600, needs 0630 arrival time)   CATARACT EXTRACTION W/PHACO Right 01/15/2018   Procedure: CATARACT EXTRACTION PHACO AND INTRAOCULAR LENS PLACEMENT (IOC)  RIGHT DIABETIC  leave so arrival will be around 7:45 ds;  Surgeon: Lockie Mola, MD;  Location: [facility];  Service: Ophthalmology;  Laterality: Right;  Diabetic - oral meds   CHOLECYSTECTOMY     COLONOSCOPY     COLONOSCOPY N/A 08/22/2021   Procedure: COLONOSCOPY;  Surgeon: Midge Minium, MD;  Location: [facility];  Service: Endoscopy;  Laterality: N/A;   COLONOSCOPY WITH PROPOFOL N/A 12/17/2014    Procedure: COLONOSCOPY WITH PROPOFOL;  Surgeon: Christena Deem, MD;  Location: [facility] ENDOSCOPY;  Service: Endoscopy;  Laterality: N/A;   correct hammertoe     FASCIECTOMY Right 10/29/2019   Procedure: PLANTAR FIBROMA RESECTION RIGHT;  Surgeon: Rosetta Posner, DPM;  Location: [facility];  Service: Podiatry;  Laterality: Right;  Diabetic - oral meds   JOINT REPLACEMENT     KNEE ARTHROSCOPY Right    LEFT HEART CATH AND CORONARY ANGIOGRAPHY N/A 06/08/2021   Procedure: LEFT HEART CATH AND CORONARY ANGIOGRAPHY;  Surgeon: Marcina Millard, MD;  Location: [facility] IN[facility]IVE CV [facility];  Service: Cardiovascular;  Laterality: N/A;   LEFT HEART CATH AND CORONARY ANGIOGRAPHY Left 08/22/2012   Procedure: LEFT HEART CATH AND CORONARY ANGIOGRAPHY; Location: [facility]; Surgeon: Harold Hedge, MD   LUMBAR LAMINECTOMY/ DECOMPRESSION WITH MET-RX N/A 02/18/2019   Procedure: L4-5 DECOMPRESSION;  Surgeon: Venetia Night, MD;  Location: [facility];  Service: Neurosurgery;  Laterality: N/A;   PARS PLANA VITRECTOMY Left 08/27/2022   Procedure: PARS PLANA VITRECTOMY 25 GAUGE FOR ENDOPHTHALMITIS;  Surgeon: Edmon Crape, MD;  Location: [facility] OR;  Service: Ophthalmology;  Laterality: Left;   SHOULDER ARTHROSCOPY WITH OPEN ROTATOR CUFF REPAIR AND DISTAL CLAVICLE ACROMINECTOMY Right 11/16/2021   Procedure: SHOULDER ARTHROSCOPY WITH OPEN ROTATOR CUFF REPAIR AND DISTAL CLAVICLE ACROMINECTOMY;  Surgeon: Juanell Fairly, MD;  Location: [facility] OR[facility];  Service: Orthopedics;  Laterality: Right;   TOTAL HIP ARTHROPLASTY Bilateral    TOTAL KNEE ARTHROPLASTY Bilateral    TRANSFORAMINAL LUMBAR INTERBODY FUSION W/ MIS 2 LEVEL N/A 03/19/2022   Procedure: L4-S1 MINIMALLY INVASIVE (MIS) TRANSFORAMINAL LUMBAR INTERBODY FUSION (TLIF);  Surgeon: Venetia Night, MD;  Location: [facility] OR[facility];  Service: Neurosurgery;  Laterality: N/A;     Social History:   reports that he has never smoked. He quit smokeless tobacco use about 44 years ago.  His  smokeless tobacco use included chew. He reports that he does not currently use alcohol. He reports that he does not use drugs.   Family History:  His family history includes Clotting disorder in his father; Hypertension in his father.   Allergies Allergies  Allergen Reactions   Clonidine Derivatives     Hallucinations    Fioricet [Butalbital-Apap-Caffeine]     hallucination   Hydrocodone-Acetaminophen     hallucinations   Mobic [Meloxicam]     hallucinations   Norvasc [Amlodipine Besylate]     hallucinations   Statins     Muscle cramps   Tramadol Itching     Home Medications  Prior to Admission medications  Medication Sig Start Date End Date Taking? Authorizing Provider  acetaminophen (TYLENOL) 500 MG tablet Take 1,000 mg by mouth every 6 (six) hours as needed.   Yes [provider]  carvedilol (COREG) 12.5 MG tablet Take 12.5 mg by mouth 2 (two) times daily.  12/04/17  Yes [provider]  finasteride (PROSCAR) 5 MG tablet Take 5 mg by mouth every morning.    Yes [provider]  gabapentin (NEURONTIN) 600 MG tablet Take 600 mg by mouth 2 (two) times daily.   Yes [provider]  glimepiride (AMARYL) 1 MG tablet Take 1 mg by mouth daily with breakfast.   Yes [provider]  lisinopril (PRINIVIL,ZESTRIL) 2.5 MG tablet Take 1 tablet (2.5 mg total) by mouth daily. Patient taking differently: Take 2.5 mg by mouth every morning. 02/28/18  Yes Sainani, Rolly Pancake, MD  metFORMIN (GLUCOPHAGE) 500 MG tablet Take 500 mg by mouth 2 (two) times daily.   Yes [provider]  methocarbamol (ROBAXIN) 750 MG tablet Take 1 tablet (750 mg total) by mouth 3 (three) times daily. 03/22/22  Yes Susanne Borders, PA  mirtazapine (REMERON) 7.5 MG tablet Take 7.5 mg by mouth at bedtime. 10/02/22  Yes [provider]  rosuvastatin (CRESTOR) 5 MG tablet Take 5 mg by mouth daily.   Yes [provider]  senna (SENOKOT) 8.6 MG TABS tablet  Take 1 tablet (8.6 mg total) by mouth daily as needed for mild constipation. 03/22/22  Yes Susanne Borders, PA  tamsulosin (FLOMAX) 0.4 MG CAPS capsule Take 0.4 mg by mouth daily. 04/03/22 04/03/23 Yes [provider]  torsemide (DEMADEX) 20 MG tablet Take 20 mg by mouth daily. 08/06/22  Yes [provider]  aspirin EC 81 MG tablet Take 81 mg by mouth daily. Swallow whole. Patient not taking: Reported on 10/10/2022    [provider]     Critical care time: 60 minutes     Harlon Ditty, AGACNP-BC [facility] Prefer epic messenger for cross cover needs If after hours, please call E-link

## 2022-10-10 NOTE — ED Triage Notes (Signed)
Per EMS wife found him in the bathroom after she heard a thump. When the arrived sat was 50-60s RA. Placed on O2 sat ibcreased to 90s. Pt could not effectively responds but only mumbles. FSBS was 109. No blood thinners per EMS. Pt did have 3-4 periods of apnea that lasted for 5-6 secs per EMS where they performed assisted ventilation briefly. Pt known hx of heart failure and DM.

## 2022-10-10 NOTE — Consult Note (Signed)
NAME: Michael Humphrey  DOB: Nov 12, 1950  MRN: [id number]  Date/Time: 10/10/2022 2:47 PM  REQUESTING PROVIDER: Tawny Hopping Subjective:  REASON FOR CONSULT: sepsis ?No hisotry from patient- chart reviewed including care everywhere and spoke to wife and sister in law at bed side Michael Humphrey is a 72 y.o. with a history of DM with Non proliferative diabetic retinopathy, prior cataract surgery and IO lens implantation , Yag capsulotomy sept 2023 left eye and sept  2022 Right eyeleft ankle septic arthritis with group C strep in dec 2015 B/l knee replacement, b/l hip replacement. L4-L5 and L5-S1 TLIF and post spine fusion, BPH with past urinary retention and had foley for short perid followed by urology, In aug 2024 had enterococcus in urine recent endogenous endopthalmitis of the left eye for which he was treated with antibiotics from 8/26-9/16 and virectomy, loss of vision left eye and plan for enucleation   is presenting from home with fall  Pt was admitted to [facility] on 8/26-8/28 for sudden onset pain and redness and impaired vision. HE underwent emergency vitrectomy both diagnostic and therapeutic and local injection of vanco and ceftaz. Cultures were sent and w=pt as started on IV ceftriaxone and vanco and discharged on PO cipro and PO linezolid by ID . Cultures were negative He was readmitted to [facility] on 8/31 with ongoing pain left eye and had aspiration of the vireous for culture and local injection and started on IV vanco and cefepime- Seen by ID and on discharge on 9/6  he was sent home on PO linezolid and IV ceftriaxone to complete 3 weeks from the day of vitrectomy on 8/26. He saw ID on 9/11 He completed Iv and PICC was removed after completion of antibiotic on 09/17/22 - he was doing okay- As per his wife 2 weeks after his discharge from [facility] and after completing IV antibiotic he mowed the lawn. He has lost nearly 30 pounds in the past month- he has no appetite He was at baseline last  night around 10pm and this morning wife heard a thud and saw him on the floor in his room- EMS noted sats of 50s, presented after a fall and found to have altered mental status and slurred speech Vitals  10/10/22 06:20  BP 115/75  Temp 97.6 F (36.4 C)  Pulse Rate 66  Resp 26 !  SpO2 100 %    Latest Reference Range & Units 10/10/22 06:25  WBC 4.0 - 10.5 K/uL 6.1  Hemoglobin 13.0 - 17.0 g/dL 08.6 (L)  HCT 57.8 - 46.9 % 28.6 (L)  Platelets 150 - 400 K/uL 235  Creatinine 0.61 - 1.24 mg/dL 6.29 (H)   He was more alert in the ED and was c/o left eye pain and head Blood culture sent Ct head, Chest, Abdomen-neg CT maxillofacial- mild nasal bone fracture acute Pt is in ICU    Past Medical History:  Diagnosis Date   Anginal pain (HCC)    Arthritis    BPH (benign prostatic hyperplasia)    CAD (coronary artery disease)    a.) LHC 08/22/2012: normal coronaries; b.) LHC 06/08/2021: EF 55-65%. 30% oLM, 100% p-mLAD, 50% o-pLAD, 60% pLAD, 30% pRCA, 75% RPDA, 75% RPAV -- med mgmt.   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   DDD (degenerative disc disease), lumbar    Diabetic retinopathy (HCC)    DISH (diffuse idiopathic skeletal hyperostosis)    Diverticulitis    Full dentures    GERD (gastroesophageal reflux disease)    Heart attack (HCC)    High cholesterol    Hordeolum internum right lower eyelid 12/09/2019   Hypertension    Incomplete right bundle branch block (RBBB)    Lumbar radiculitis    a.) s/p L4-L5 decompression   Lumbar radiculopathy    Posterior capsular opacification, right 09/26/2020   Found by  Dr. Janee Morn recently and sent here   Pulmonary embolism (HCC)    Skin cancer    Sleep apnea    a.) unable to tolerate nocturnal PAP therapy   Spinal stenosis of lumbar region, unspecified whether neurogenic claudication present    Statin intolerance    T2DM (type 2 diabetes mellitus) (HCC)    Tubular adenoma of colon     Past Surgical History:  Procedure Laterality Date   AMPUTATION TOE Left 01/15/2020   Procedure: AMPUTATION TOE MPJ T1;  Surgeon: Gwyneth Revels, DPM;  Location: [facility] ORS;  Service: Podiatry;  Laterality: Left;   APPLICATION OF INTRAOPERATIVE CT SCAN N/A 03/19/2022   Procedure: APPLICATION OF INTRAOPERATIVE CT SCAN;  Surgeon: Venetia Night, MD;  Location: [facility] ORS;  Service: Neurosurgery;  Laterality: N/A;   BUNIONECTOMY     CATARACT EXTRACTION W/PHACO Left 12/11/2017   Procedure: CATARACT EXTRACTION PHACO AND INTRAOCULAR LENS PLACEMENT (IOC)  LEFT DIABETIC;  Surgeon: Lockie Mola, MD;  Location: [facility];  Service: Ophthalmology;  Laterality: Left;  DIABETIC (ACTA picking pt up at 0600, needs 0630 arrival time)   CATARACT EXTRACTION W/PHACO Right 01/15/2018   Procedure: CATARACT EXTRACTION PHACO AND INTRAOCULAR LENS PLACEMENT (IOC)  RIGHT DIABETIC  leave so arrival will be around 7:45 ds;  Surgeon: Lockie Mola, MD;  Location: [facility];  Service: Ophthalmology;  Laterality: Right;  Diabetic - oral meds   CHOLECYSTECTOMY     COLONOSCOPY     COLONOSCOPY N/A 08/22/2021   Procedure: COLONOSCOPY;  Surgeon: Midge Minium, MD;  Location: [facility] ENDOSCOPY;  Service: Endoscopy;  Laterality: N/A;   COLONOSCOPY WITH PROPOFOL N/A 12/17/2014   Procedure: COLONOSCOPY WITH PROPOFOL;  Surgeon: Christena Deem, MD;  Location: [facility] ENDOSCOPY;  Service: Endoscopy;  Laterality: N/A;   correct hammertoe     FASCIECTOMY Right 10/29/2019   Procedure: PLANTAR FIBROMA RESECTION RIGHT;  Surgeon: Rosetta Posner, DPM;  Location: [facility];   Service: Podiatry;  Laterality: Right;  Diabetic - oral meds   JOINT REPLACEMENT     KNEE ARTHROSCOPY Right    LEFT HEART CATH AND CORONARY ANGIOGRAPHY N/A 06/08/2021   Procedure: LEFT HEART CATH AND CORONARY ANGIOGRAPHY;  Surgeon: Marcina Millard, MD;  Location: [facility] INVASIVE CV LAB;  Service: Cardiovascular;  Laterality: N/A;   LEFT HEART CATH AND CORONARY ANGIOGRAPHY Left 08/22/2012   Procedure: LEFT HEART CATH AND CORONARY ANGIOGRAPHY; Location: [facility]; Surgeon: Harold Hedge, MD   LUMBAR LAMINECTOMY/ DECOMPRESSION WITH MET-RX N/A 02/18/2019   Procedure: L4-5 DECOMPRESSION;  Surgeon: Venetia Night, MD;  Location: [facility] ORS;  Service: Neurosurgery;  Laterality: N/A;   PARS PLANA VITRECTOMY Left 08/27/2022   Procedure: PARS PLANA VITRECTOMY 25 GAUGE FOR ENDOPHTHALMITIS;  Surgeon: Edmon Crape, MD;  Location: [facility] OR;  Service: Ophthalmology;  Laterality: Left;   SHOULDER  ARTHROSCOPY WITH OPEN ROTATOR CUFF REPAIR AND DISTAL CLAVICLE ACROMINECTOMY Right 11/16/2021   Procedure: SHOULDER ARTHROSCOPY WITH OPEN ROTATOR CUFF REPAIR AND DISTAL CLAVICLE ACROMINECTOMY;  Surgeon: Juanell Fairly, MD;  Location: [facility] ORS;  Service: Orthopedics;  Laterality: Right;   TOTAL HIP ARTHROPLASTY Bilateral    TOTAL KNEE ARTHROPLASTY Bilateral    TRANSFORAMINAL LUMBAR INTERBODY FUSION W/ MIS 2 LEVEL N/A 03/19/2022   Procedure: L4-S1 MINIMALLY INVASIVE (MIS) TRANSFORAMINAL LUMBAR INTERBODY FUSION (TLIF);  Surgeon: Venetia Night, MD;  Location: [facility] ORS;  Service: Neurosurgery;  Laterality: N/A;    Social History   Socioeconomic History   Marital status: Married    Spouse name: Elease Hashimoto   Number of children: Not on file   Years of education: Not on file   Highest education level: Not on file  Occupational History   Not on file  Tobacco Use   Smoking status: Never   Smokeless tobacco: Former    Types: Chew    Quit date: 02/08/1978   Tobacco comments:    No vap, E cig, chew  Vaping Use   Vaping  status: Never Used  Substance and Sexual Activity   Alcohol use: Not Currently   Drug use: No   Sexual activity: Not on file  Other Topics Concern   Not on file  Social History Narrative   Not on file   Social Determinants of Health   Financial Resource Strain: Low Risk  (04/02/2022)   Received from [facility]   Overall Financial Resource Strain (CARDIA)    Difficulty of Paying Living Expenses: Not hard at all  Food Insecurity: No Food Insecurity (08/27/2022)   Hunger Vital Sign    Worried About Running Out of Food in the Last Year: Never true    Ran Out of Food in the Last Year: Never true  Transportation Needs: No Transportation Needs (08/27/2022)   PRAPARE - Administrator, Civil Service (Medical): No    Lack of Transportation (Non-Medical): No  Physical Activity: Not on file  Stress: Not on file  Social Connections: Not on file  Intimate Partner Violence: Not At Risk (08/27/2022)   Humiliation, Afraid, Rape, and Kick questionnaire    Fear of Current or Ex-Partner: No    Emotionally Abused: No    Physically Abused: No    Sexually Abused: No    Family History  Problem Relation Age of Onset   Clotting disorder Father    Hypertension Father    Allergies  Allergen Reactions   Clonidine Derivatives     Hallucinations    Fioricet [Butalbital-Apap-Caffeine]     hallucination   Hydrocodone-Acetaminophen     hallucinations   Mobic [Meloxicam]     hallucinations   Norvasc [Amlodipine Besylate]     hallucinations   Statins     Muscle cramps   Tramadol Itching   I? Current Facility-Administered Medications  Medication Dose Route Frequency Provider Last Rate Last Admin   0.9 %  sodium chloride infusion  250 mL Intravenous Continuous Lowella Bandy, RPH       ceFEPIme (MAXIPIME) 2 g in sodium chloride 0.9 % 100 mL IVPB  2 g Intravenous Q12H Orson Aloe, RPH       docusate sodium (COLACE) capsule 100 mg  100 mg Oral BID PRN Judithe Modest, NP       heparin injection 5,000 Units  5,000 Units Subcutaneous Q8H Harlon Ditty D, NP   5,000 Units at 10/10/22  1310   lactated ringers infusion   Intravenous Continuous Shaune Pollack, MD 100 mL/hr at 10/10/22 1400 Infusion Verify at 10/10/22 1400   norepinephrine (LEVOPHED) 4mg  in (0.016 mg/mL) premix infusion  2-10 mcg/min Intravenous Titrated Lowella Bandy, RPH 15 mL/hr at 10/10/22 1400 4 mcg/min at 10/10/22 1400   polyethylene glycol (MIRALAX / GLYCOLAX) packet 17 g  17 g Oral Daily PRN Harlon Ditty D, NP       potassium chloride 10 mEq in 100 mL IVPB  10 mEq Intravenous Q1 Hr x 4 Orson Aloe, RPH 100 mL/hr at 10/10/22 1427 10 mEq at 10/10/22 1427   [START ON 10/11/2022] vancomycin (VANCOCIN) IVPB 1000 mg/200 mL premix  1,000 mg Intravenous Q24H Orson Aloe, [facility]       vancomycin variable dose per unstable renal function (pharmacist dosing)   Does not apply See admin instructions Orson Aloe, RPH         Abtx:  Anti-infectives (From admission, onward)    Start     Dose/Rate Route Frequency Ordered Stop   10/11/22 1000  vancomycin (VANCOCIN) IVPB 1000 mg/200 mL premix        1,000 mg 200 mL/hr over 60 Minutes Intravenous Every 24 hours 10/10/22 1137     10/10/22 2200  ceFEPIme (MAXIPIME) 2 g in sodium chloride 0.9 % 100 mL IVPB        2 g 200 mL/hr over 30 Minutes Intravenous Every 12 hours 10/10/22 1123     10/10/22 1150  vancomycin variable dose per unstable renal function (pharmacist dosing)         Does not apply See admin instructions 10/10/22 1150     10/10/22 0900  ceFEPIme (MAXIPIME) 2 g in sodium chloride 0.9 % 100 mL IVPB        2 g 200 mL/hr over 30 Minutes Intravenous  Once 10/10/22 0848 10/10/22 0935   10/10/22 0900  vancomycin (VANCOREADY) IVPB 1500 mg/300 mL        1,500 mg 150 mL/hr over 120 Minutes Intravenous  Once 10/10/22 0857 10/10/22 1259   10/10/22 0845  metroNIDAZOLE (FLAGYL) IVPB 500 mg        500 mg 100  mL/hr over 60 Minutes Intravenous  Once 10/10/22 0832 10/10/22 0950       REVIEW OF SYSTEMS:  Const: not reliable C/o pain and wants pain meds Objective:  VITALS:  BP 98/72   Pulse 60   Temp (!) 96.3 F (35.7 C) (Axillary)   Resp 11   Ht 5\' 9"  (1.753 m)   Wt 71.7 kg   SpO2 95%   BMI 23.34 kg/m   PHYSICAL EXAM:  General:somnolent, eyes closed , responds verbally to questions like name, place.  Head: both eyes closed- he opens eyes especially the right Left lower lid bruising Eyes: left Conjunctivae hemorrhage- unable to examine well ENT Nares normal. No drainage or sinus tenderness. Lips, mucosa, and tongue normal. No Thrush Neck:  symmetrical, no adenopathy, thyroid: non tender no carotid bruit and no JVD. Back: did not examine Lungs: b/l air entry. Heart: Regular rate and rhythm, no murmur, rub or gallop. Abdomen: Soft, non-tender,not distended. Bowel sounds normal. No masses Extremities: atraumatic, no cyanosis. No edema. No clubbing Skin: No rashes or lesions. Or bruising Lymph: Cervical, supraclavicular normal. Neurologic: moves all limbs Pertinent Labs Lab Results CBC    Component Value Date/Time   WBC 6.1 10/10/2022 0625   RBC 3.17 (L) 10/10/2022 0625   HGB 10.4 (  L) 10/10/2022 0625   HGB 12.2 (L) 01/05/2014 0730   HCT 28.6 (L) 10/10/2022 0625   HCT 36.1 (L) 01/05/2014 0730   PLT 235 10/10/2022 0625   PLT 348 01/05/2014 0730   MCV 90.2 10/10/2022 0625   MCV 96 01/05/2014 0730   MCH 32.8 10/10/2022 0625   MCHC 36.4 (H) 10/10/2022 0625   RDW 14.6 10/10/2022 0625   RDW 14.1 01/05/2014 0730   LYMPHSABS 1.5 10/10/2022 0625   LYMPHSABS 1.7 01/05/2014 0730   MONOABS 0.3 10/10/2022 0625   MONOABS 1.0 01/05/2014 0730   EOSABS 0.1 10/10/2022 0625   EOSABS 0.3 01/05/2014 0730   BASOSABS 0.1 10/10/2022 0625   BASOSABS 0.1 01/05/2014 0730   BASOSABS 0 12/22/2013 1843       Latest Ref Rng & Units 10/10/2022    6:25 AM 08/29/2022    2:48 PM 08/27/2022     3:47 PM  CMP  Glucose 70 - 99 mg/dL 87  696  295   BUN 8 - 23 mg/dL 29  40  23   Creatinine 0.61 - 1.24 mg/dL 2.84  1.32  4.40   Sodium 135 - 145 mmol/L 135  137  138   Potassium 3.5 - 5.1 mmol/L 2.5  3.6  3.6   Chloride 98 - 111 mmol/L 101  103  105   CO2 22 - 32 mmol/L 22  25  21    Calcium 8.9 - 10.3 mg/dL 8.3  8.6  8.7   Total Protein 6.5 - 8.1 g/dL 6.2   6.5   Total Bilirubin 0.3 - 1.2 mg/dL 0.7   0.8   Alkaline Phos 38 - 126 U/L 86   125   AST 15 - 41 U/L 21   18   ALT 0 - 44 U/L 24   18       Microbiology: Recent Results (from the past 240 hour(s))  SARS Coronavirus 2 by RT PCR (hospital order, performed in [facility] hospital lab) *cepheid single result test* Anterior Nasal Swab     Status: None   Collection Time: 10/10/22  6:25 AM   Specimen: Anterior Nasal Swab  Result Value Ref Range Status   SARS Coronavirus 2 by RT PCR NEGATIVE NEGATIVE Final    Comment: (NOTE) SARS-CoV-2 target nucleic acids are NOT DETECTED.  The SARS-CoV-2 RNA is generally detectable in upper and lower respiratory specimens during the acute phase of infection. The lowest concentration of SARS-CoV-2 viral copies this assay can detect is 250 copies / mL. A negative result does not preclude SARS-CoV-2 infection and should not be used as the sole basis for treatment or other patient management decisions.  A negative result may occur with improper specimen collection / handling, submission of specimen other than nasopharyngeal swab, presence of viral mutation(s) within the areas targeted by this assay, and inadequate number of viral copies (<250 copies / mL). A negative result must be combined with clinical observations, patient history, and epidemiological information.  Fact Sheet for Patients:   RoadLapTop.co.za  Fact Sheet for Healthcare Providers: http://kim-miller.com/  This test is not yet approved or  cleared by the [place] FDA and has  been authorized for detection and/or diagnosis of SARS-CoV-2 by FDA under an Emergency Use Authorization (EUA).  This EUA will remain in effect (meaning this test can be used) for the duration of the COVID-19 declaration under Section 564(b)(1) of the Act, 21 U.S.C. section 360bbb-3(b)(1), unless the authorization is terminated or revoked sooner.  Performed at  [facility], [address] [place], [place] [address]   MRSA Next Gen by PCR, Nasal     Status: None   Collection Time: 10/10/22 12:51 PM   Specimen: Nasal Mucosa; Nasal Swab  Result Value Ref Range Status   MRSA by PCR Next Gen NOT DETECTED NOT DETECTED Final    Comment: (NOTE) The GeneXpert MRSA Assay (FDA approved for NASAL specimens only), is one component of a comprehensive MRSA colonization surveillance program. It is not intended to diagnose MRSA infection nor to guide or monitor treatment for MRSA infections. Test performance is not FDA approved in patients less than 14 years old. Performed at [facility], [address] [place], [place] [address]     IMAGING RESULTS: CT head, chest reviewed- no acute abnormality I have personally reviewed the films ? 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 ? I have personally spent  75---minutes involved in face-to-face and non-face-to-face activities for this patient on the day of the visit. Professional time spent includes the following activities: Preparing to see the patient (review of tests), Obtaining  and/or reviewing separately obtained history (admission/discharge record), Performing a medically appropriate examination and/or evaluation , Ordering medications/tests/procedures, referring and communicating with other health care professionals, Documenting clinical information in the EMR, Independently interpreting results (not separately reported), Communicating results to the patient/family/caregiver, Counseling and educating the patient/family/caregiver and Care coordination (not separately reported).    ________________________________________________  Note:  This document was prepared using Conservation officer, historic buildings and may include unintentional dictation errors.

## 2022-10-10 NOTE — ED Notes (Signed)
Pts spouse escorted to ED Room 13 at this time. EDP in room talking to pts spouse now.

## 2022-10-10 NOTE — Consult Note (Addendum)
Code stroke activated at 0726. NCCT already done. mRS 0.  Left for CTA at 0730 and returned at 0756.  Telespecialists paged at [phone]. Dr Ether Griffins on camera for neuro exam at [phone]. Report and NCCT results given at this time.   CTA results texted to Dr Ether Griffins at [phone]. Spoke with Dr Ether Griffins on the phone to discuss CTA results at [phone].  Derrek Monaco, telestroke RN

## 2022-10-10 NOTE — ED Notes (Signed)
Code  stroke  called  to  [facility]link 

## 2022-10-10 NOTE — ED Provider Notes (Addendum)
[facility] Provider Note    None    (approximate)   History   No chief complaint on file.   HPI  Michael Humphrey is a 72 y.o. male with history of left eye endophthalmitis who comes in with concerns for collapse.  On review of records from his admission on 09/01/2022 patient has a history of diabetes, hypertension, hyperlipidemia with a plan to have surgery today.  Patient's wife heard him have a fall but she is blind so is unclear if he tripped or he had a syncopal episode.  Initially with EMS patient was having periods of apnea and unresponsive initial sats were in the 50s however upon arrival to the ER he became more awake but was moaning in nature  Glucose 109   Physical Exam   Triage Vital Signs: Blood pressure 115/75, pulse 66, temperature 97.6 F (36.4 C), temperature source Oral, resp. rate (!) 26, SpO2 100%.  Most recent vital signs: Vitals:   10/10/22 0617  Resp: (!) 28  SpO2: 100%     General: Awake, no distress.  CV:  Good peripheral perfusion.  Resp:  Normal effort.  Abd:  No distention. Tender with palpatation  Other:  Patient able to follow commands and does grip with both hands with equal grip strength.  He is able to wiggle bilateral toes.  He is unable to lift his legs up off the bed bilaterally.  Does not seem to be any pain with ranging of his hips.  His abdomen seems soft and nontender.  Got some bruising and pain around the left eye but he is currently being treated for an eye infection I am not able to open the eye due to patient holding his eyes shut.  He is moaning and difficult to understand.   ED Results / Procedures / Treatments   Labs (all labs ordered are listed, but only abnormal results are displayed) Labs Reviewed  SARS CORONAVIRUS 2 BY RT PCR  CBC WITH DIFFERENTIAL/PLATELET  COMPREHENSIVE METABOLIC PANEL  MAGNESIUM  URINALYSIS, ROUTINE W REFLEX MICROSCOPIC  CBG MONITORING, ED  TROPONIN I (HIGH  SENSITIVITY)     EKG  My interpretation of EKG:  Sinus rate of 64 without any ST elevation or T wave inversions but does have a left bundle branch block  RADIOLOGY\  I reviewed the CT had personally interpreted and no evidence of intercranial hemorrhage  PROCEDURES:  Critical Care performed: Yes, see critical care procedure note(s)  .1-3 Lead EKG Interpretation  Performed by: Concha Se, MD Authorized by: Concha Se, MD     Interpretation: normal     ECG rate:  60   ECG rate assessment: normal     Rhythm: sinus rhythm     Ectopy: none     Conduction: normal   .Critical Care  Performed by: Concha Se, MD Authorized by: Concha Se, MD   Critical care provider statement:    Critical care time (minutes):  30   Critical care was necessary to treat or prevent imminent or life-threatening deterioration of the following conditions:  CNS failure or compromise   Critical care was time spent personally by me on the following activities:  Development of treatment plan with patient or surrogate, discussions with consultants, evaluation of patient's response to treatment, examination of patient, ordering and review of laboratory studies, ordering and review of radiographic studies, ordering and performing treatments and interventions, pulse oximetry, re-evaluation of patient's condition and review of old  charts    MEDICATIONS ORDERED IN ED: Medications  potassium chloride 10 mEq in 100 mL IVPB (has no administration in time range)  sodium chloride 0.9 % bolus 500 mL (has no administration in time range)  dextrose 50 % solution 50 mL (has no administration in time range)     IMPRESSION / MDM / ASSESSMENT AND PLAN / ED COURSE  I reviewed the triage vital signs and the nursing notes.   Patient's presentation is most consistent with acute presentation with potential threat to life or bodily function.   Patient comes in with concerns for a fall.  Unclear if patient had a  syncopal episode or mechanical fall.  Concern that patient was unresponsive.  Of apnea and hypoxia with EMS.  Here patient is difficult to understand unclear his baseline but does follow commands seems to have equal grip strength.  He is afebrile and has sats of 100% on room air.  I am mostly concerned of a intracranial hemorrhage given the trauma so we will get a stat CT scan.   6:34 AM I discussed with the patient's wife who states that patient was last seen normal last night before going to bed at 10pm but this morning she just heard a thud and then him on the ground.  Therefore patient is out of the window for TNK for LLK of last time.  He does typically take a aspirin but has been been on hold given he is post to have a surgery  IMPRESSION: 1. No acute intracranial abnormality or acute traumatic injury identified. 2. Stable noncontrast CT appearance of moderately advanced small vessel disease.  7:15 AM Patient does have some episode of sleep apnea but he is able to be woken up with some shaking he develops ability to take deep breaths and end-tidal goes back up.  He would still be in the window for LVO therefore did call a stroke code given significant apnea with aphasia.  From what I can tell he is able to move all of his extremities weakly.  Glucose was initially normal  Glucose was noted to be low therefore I did give him an amp of dextrose  Will get CT PE to ensure no PE given hypoxia and recent hospitalization and abd given he was tender on exam.   Signed out pending results  The patient is on the cardiac monitor to evaluate for evidence of arrhythmia and/or significant heart rate changes.   FINAL CLINICAL IMPRESSION(S) / ED DIAGNOSES   Final diagnoses:  Fall, initial encounter  Hypoglycemia  Difficulty speaking     Rx / DC Orders   ED Discharge Orders     None        Note:  This document was prepared using Dragon voice recognition software and may include  unintentional dictation errors.       Concha Se, MD 10/10/22 [phone]

## 2022-10-10 NOTE — Sepsis Progress Note (Signed)
eLink is following this Code Sepsis. °

## 2022-10-10 NOTE — Consult Note (Signed)
PHARMACY -  BRIEF ANTIBIOTIC NOTE   Pharmacy has received consult(s) for vancomycin and cefepime dosing from an ED provider.  The patient's profile has been reviewed for ht/wt/allergies/indication/available labs.    One time order(s) placed for cefepime 2 grams IV x 1 and Vancomycin 1500 mg IV x 1  Further antibiotics/pharmacy consults should be ordered by admitting physician if indicated.                       Thank you, Barrie Folk, PharmD 10/10/2022  8:49 AM

## 2022-10-10 NOTE — IPAL (Addendum)
  Interdisciplinary Goals of Care Family Meeting   Date carried out: 10/10/2022  Location of the meeting: Bedside  Member's involved: Physician, Nurse Practitioner, Bedside Registered Nurse, and Family Member or next of kin   GOALS OF CARE DISCUSSION  The Clinical status was relayed to family in detail- Wife and Sister in law at bedside Patient alert and awake  Updated and notified of patients medical condition- Explained to family course of therapy and the modalities   Patient with Progressive multiorgan failure with a very high probablity of a very minimal chance of meaningful recovery despite all aggressive and optimal medical therapy.  Patient has been suffering for multiple weeks He has stopped eating 4 weeks ago Lost 30 pounds Patient relayed to me that he is ready to go  Family understands the situation. They have consented and agreed to DNR/DNI  We have discussed possibility of comfort care measures-recommend palliative care consultation Will need to give pain meds as needed We will not add any more additional pressors, we will not escalate Patient refused NG tube placement   Family are satisfied with Plan of action and management. All questions answered  Additional CC time 31 mins   Lurene Robley Santiago Glad, M.D.  [facility]  Medical Director [facility] Medical Director [facility] Cardio-Pulmonary Department

## 2022-10-10 NOTE — Consult Note (Signed)
CODE SEPSIS - PHARMACY COMMUNICATION  **Broad Spectrum Antibiotics should be administered within 1 hour of Sepsis diagnosis**  Time Code Sepsis Called/Page Received: 1610  Antibiotics Ordered: cefepime, Flagyl, and vancomycin  Time of 1st antibiotic administration: 0905  Additional action taken by pharmacy: N/A  Barrie Folk ,PharmD Clinical Pharmacist  10/10/2022  8:49 AM

## 2022-10-10 NOTE — ED Notes (Signed)
Patient transported to CT by primary RN. 

## 2022-10-11 ENCOUNTER — Inpatient Hospital Stay
Admit: 2022-10-11 | Discharge: 2022-10-11 | Disposition: A | Discharge status: Home / Self Care | Payer: PPO | Attending: Pulmonary Disease | Admitting: Pulmonary Disease

## 2022-10-11 DIAGNOSIS — E43 Unspecified severe protein-calorie malnutrition: Secondary | ICD-10-CM | POA: Insufficient documentation

## 2022-10-11 DIAGNOSIS — Z515 Encounter for palliative care: Secondary | ICD-10-CM | POA: Diagnosis not present

## 2022-10-11 DIAGNOSIS — G9341 Metabolic encephalopathy: Secondary | ICD-10-CM | POA: Diagnosis not present

## 2022-10-11 DIAGNOSIS — L8990 Pressure ulcer of unspecified site, unspecified stage: Secondary | ICD-10-CM | POA: Insufficient documentation

## 2022-10-11 DIAGNOSIS — Z7189 Other specified counseling: Secondary | ICD-10-CM

## 2022-10-11 LAB — CBC
HCT: 27.3 % — ABNORMAL LOW (ref 39.0–52.0)
Hemoglobin: 9.5 g/dL — ABNORMAL LOW (ref 13.0–17.0)
MCH: 32.8 pg (ref 26.0–34.0)
MCHC: 34.8 g/dL (ref 30.0–36.0)
MCV: 94.1 fL (ref 80.0–100.0)
Platelets: 184 10*3/uL (ref 150–400)
RBC: 2.9 MIL/uL — ABNORMAL LOW (ref 4.22–5.81)
RDW: 14.8 % (ref 11.5–15.5)
WBC: 6.4 10*3/uL (ref 4.0–10.5)
nRBC: 0 % (ref 0.0–0.2)

## 2022-10-11 LAB — HIV ANTIBODY (ROUTINE TESTING W REFLEX): HIV Screen 4th Generation wRfx: NONREACTIVE

## 2022-10-11 LAB — RENAL FUNCTION PANEL
Albumin: 2.7 g/dL — ABNORMAL LOW (ref 3.5–5.0)
Anion gap: 7 (ref 5–15)
BUN: 16 mg/dL (ref 8–23)
CO2: 22 mmol/L (ref 22–32)
Calcium: 7.8 mg/dL — ABNORMAL LOW (ref 8.9–10.3)
Chloride: 106 mmol/L (ref 98–111)
Creatinine, Ser: 0.85 mg/dL (ref 0.61–1.24)
GFR, Estimated: >60 mL/min (ref >60)
Glucose, Bld: 99 mg/dL (ref 70–99)
Phosphorus: 2.5 mg/dL (ref 2.5–4.6)
Potassium: 3.3 mmol/L — ABNORMAL LOW (ref 3.5–5.1)
Sodium: 135 mmol/L (ref 135–145)

## 2022-10-11 LAB — C-REACTIVE PROTEIN: CRP: 0.6 mg/dL (ref <1.0)

## 2022-10-11 LAB — BLOOD GAS, VENOUS
Bicarbonate: 25.7 mmol/L (ref 20.0–28.0)
O2 Saturation: 15.9 mmol/L (ref 0.0–2.0)
Patient temperature: 37
Patient temperature: 37 %
pCO2, Ven: 51 mm[Hg] (ref 44–60)
pH, Ven: 7.31 (ref 7.25–7.43)
pO2, Ven: <31 mmol/L — CL (ref 32–45)

## 2022-10-11 LAB — MAGNESIUM: Magnesium: 1.7 mg/dL (ref 1.7–2.4)

## 2022-10-11 LAB — FOLATE: Folate: 4.9 ng/mL — ABNORMAL LOW (ref >5.9)

## 2022-10-11 LAB — IRON AND TIBC
Iron: 38 ug/dL — ABNORMAL LOW (ref 45–182)
Saturation Ratios: 20 % (ref 17.9–39.5)
TIBC: 189 ug/dL — ABNORMAL LOW (ref 250–450)
UIBC: 151 ug/dL

## 2022-10-11 LAB — VITAMIN D 25 HYDROXY (VIT D DEFICIENCY, FRACTURES): Vit D, 25-Hydroxy: 26.2 ng/mL — ABNORMAL LOW (ref 30–100)

## 2022-10-11 LAB — VITAMIN B12: Vitamin B-12: 723 pg/mL (ref 180–914)

## 2022-10-11 LAB — URINE CULTURE: Culture: NO GROWTH

## 2022-10-11 MED ORDER — ONDANSETRON HCL 4 MG/2ML IJ SOLN: 4.0000 mg | Freq: Four times a day (QID) | INTRAMUSCULAR | Status: DC | PRN

## 2022-10-11 MED ORDER — ACETAMINOPHEN 650 MG RE SUPP: 650.0000 mg | Freq: Four times a day (QID) | RECTAL | Status: DC | PRN

## 2022-10-11 MED ORDER — ACETAMINOPHEN 325 MG PO TABS: 650.0000 mg | ORAL_TABLET | Freq: Four times a day (QID) | ORAL | Status: DC | PRN

## 2022-10-11 MED ORDER — LORAZEPAM 2 MG/ML IJ SOLN
0.5000 mg | INTRAMUSCULAR | Status: DC | PRN
  Administered 2022-10-11: 0.5 mg via INTRAVENOUS
  Filled 2022-10-11: qty 1

## 2022-10-11 MED ORDER — SODIUM CHLORIDE 0.9 % IV SOLN
2.0000 g | Freq: Three times a day (TID) | INTRAVENOUS | Status: DC
  Administered 2022-10-11: 2 g via INTRAVENOUS
  Filled 2022-10-11 (×2): qty 12.5

## 2022-10-11 MED ORDER — ONDANSETRON 4 MG PO TBDP: 4.0000 mg | ORAL_TABLET | Freq: Four times a day (QID) | ORAL | Status: DC | PRN

## 2022-10-11 MED ORDER — BIOTENE DRY MOUTH MT LIQD: 15.0000 mL | OROMUCOSAL | Status: DC | PRN

## 2022-10-11 MED ORDER — HALOPERIDOL 0.5 MG PO TABS: 0.5000 mg | ORAL_TABLET | ORAL | Status: DC | PRN

## 2022-10-11 MED ORDER — GLYCOPYRROLATE 0.2 MG/ML IJ SOLN: 0.2000 mg | INTRAMUSCULAR | Status: DC | PRN

## 2022-10-11 MED ORDER — MORPHINE SULFATE (PF) 2 MG/ML IV SOLN
1.0000 mg | INTRAVENOUS | Status: DC | PRN
  Administered 2022-10-11: 2 mg via INTRAVENOUS
  Filled 2022-10-11: qty 1

## 2022-10-11 MED ORDER — HALOPERIDOL LACTATE 2 MG/ML PO CONC: 0.5000 mg | ORAL | Status: DC | PRN

## 2022-10-11 MED ORDER — VANCOMYCIN HCL IN DEXTROSE 1-5 GM/200ML-% IV SOLN
1000.0000 mg | Freq: Two times a day (BID) | INTRAVENOUS | Status: DC
  Administered 2022-10-11: 1000 mg via INTRAVENOUS
  Filled 2022-10-11: qty 200

## 2022-10-11 MED ORDER — HALOPERIDOL LACTATE 5 MG/ML IJ SOLN
0.5000 mg | INTRAMUSCULAR | Status: DC | PRN
  Administered 2022-10-11: 0.5 mg via INTRAVENOUS
  Filled 2022-10-11: qty 1

## 2022-10-11 MED ORDER — GLYCOPYRROLATE 1 MG PO TABS: 1.0000 mg | ORAL_TABLET | ORAL | Status: DC | PRN

## 2022-10-11 MED ORDER — POLYVINYL ALCOHOL 1.4 % OP SOLN: 1.0000 [drp] | Freq: Four times a day (QID) | OPHTHALMIC | Status: DC | PRN

## 2022-10-11 MED ORDER — HALOPERIDOL LACTATE 5 MG/ML IJ SOLN
2.0000 mg | Freq: Four times a day (QID) | INTRAMUSCULAR | Status: DC | PRN
  Administered 2022-10-11: 2 mg via INTRAVENOUS
  Filled 2022-10-11: qty 1

## 2022-10-11 MED ORDER — POTASSIUM CHLORIDE 10 MEQ/100ML IV SOLN
10.0000 meq | INTRAVENOUS | Status: DC
  Administered 2022-10-11 (×3): 10 meq via INTRAVENOUS
  Filled 2022-10-11 (×3): qty 100

## 2022-10-11 MED ORDER — POTASSIUM CHLORIDE CRYS ER 20 MEQ PO TBCR
40.0000 meq | EXTENDED_RELEASE_TABLET | Freq: Once | ORAL | Status: AC
  Administered 2022-10-11: 40 meq via ORAL
  Filled 2022-10-11: qty 2

## 2022-10-11 MED ORDER — ACETAMINOPHEN 325 MG PO TABS
650.0000 mg | ORAL_TABLET | Freq: Four times a day (QID) | ORAL | Status: DC | PRN
  Administered 2022-10-11: 650 mg via ORAL
  Filled 2022-10-11: qty 2

## 2022-10-11 NOTE — TOC Transition Note (Signed)
Transition of Care [facility]) - CM/SW Discharge Note   Patient Details  Name: Michael Humphrey MRN: [id number] Date of Birth: 1950-01-31  Transition of Care [facility]) CM/SW Contact:  Allena Katz, LCSW Phone Number: 10/11/2022, 3:40 PM   Clinical Narrative:   Pt has orders to discharge to [facility] hospice facility. DNR on chart. Medical Necessity on chart.      Final next level of care: Hospice Medical Facility Barriers to Discharge: Barriers Resolved   Patient Goals and CMS Choice CMS Medicare.gov Compare Post Acute Care list provided to:: Patient Represenative (must comment) (WIFE) Choice offered to / list presented to : Spouse  Discharge Placement                  Patient to be transferred to facility by: acems Name of family member notified: wife Patient and family notified of of transfer: 10/11/22  Discharge Plan and Services Additional resources added to the After Visit Summary for                                       Social Determinants of Health (SDOH) Interventions SDOH Screenings   Food Insecurity: No Food Insecurity (08/27/2022)  Housing: Low Risk  (08/27/2022)  Transportation Needs: No Transportation Needs (08/27/2022)  Utilities: Not At Risk (08/27/2022)  Depression (PHQ2-9): Low Risk  (09/12/2022)  Financial Resource Strain: Low Risk  (04/02/2022)   Received from [facility], [facility]  Tobacco Use: Medium Risk (10/10/2022)     Readmission Risk Interventions     No data to display

## 2022-10-11 NOTE — Progress Notes (Signed)
[facility] Progress Note  Patient: Michael Humphrey    RUE:[id number]  DOA: 10/10/2022     Date of Service: the patient was seen and examined on 10/11/2022  Chief Complaint  Patient presents with   Loss of Consciousness    Per EMS wife found him in the bathroom after she heard a thump. When the arrived sat was 50-60s RA. Placed on O2 sat ibcreased to 90s. Pt could not effectively responds but only mumbles. FSBS was 109. No blood thinners per EMS. Pt did have 3-4 periods of apnea that lasted for 5-6 secs per EMS. Pt known hx of heart failure and DM.    Brief hospital course: 72 year old male with past medical history significant for recent recurrent left eye endophthalmitis and Enterococcus faecalis UTI admitted with acute metabolic encephalopathy status post fall, undifferentiated shock, along with concern for sepsis currently of unknown etiology, and AKI on CKD.   presented to the ED s/p fall with Altered Mental Status at home this morning. Past medical history significant for HTN, CAD, DM, HLD, and left eye endophthalmitis. Per wife report, his last known well time was around 10:00 PM last night, and this morning upon awakening she heard the patient fall in other room, and is unsure whether he tripped or had a syncopal episode. EMS report states upon arrival, O2 sats were in 50s and patient was unresponsive and experiencing periods of apnea. Patient reports pain in left eye and neck tenderness.     His wife reports he seemed more confused yesterday evening, but she denies knowledge of recent fevers, chills, headache, chest pain, SOB, abdominal pain, N/V/D, dysuria, wounds. She does report poor intake and weight loss of approximately 20 to 30 pounds over the past few months, and that he has had significant urinary retention.   Of note the patient was recently treated at [facility] on 08/27/22 for left endophthalmitis on 08/27/2022 of which he underwent surgical Vitrectomy.  He was also  found to have Enterococcus Faecalis UTI.   He then returned to the ED at [facility] on 09/01/2022 with persistent left eye pain and headache.  CT imaging without abscess.  He was offered Surgery on 9/3 for [facility] washout and PPV but he ended up declining surgery. There was concern for undiagnosed systemic infection, therefore he was discharged on PO Linezolid and with PICC line for IV ceftriaxone (completion date 9/16).  He has been following closely with ophthalmology and infectious disease with tentative plan for removal of left eye on Friday 10/12/22 at [facility].  Code stroke was called to the ED, workup negative, out of window for tPA.  Patient became hypotensive and hypoglycemic, did not improve after fluid resuscitation.  Patient was started on peripheral Levophed, transferred to ICU for further management.  ID was consulted for antibiotic management.  Patient was made DNR/DNI and downgraded from ICU to hospitalist service on 10/11/2022.  10/10 patient is very confused, altered mental status, trying to get out of the bed.  AAO x 2, discussed goals of care with the patient's wife who agreed with comfort measures only and hospice placement.  Discussed with palliative care and TOC for hospice placement Comfort measure orders placed and discontinued all the other medications  Assessment and Plan:  Comfort measures only Patient presented with altered mental status, fall most likely due to septic shock.  Unknown source of infection. After discussion with patient's wife, comfort measures orders placed and discontinued antibiotics.  Patient was transferred to floor for comfort  measures Palliative care consulted and TOC informed for hospice placement Continue as needed medication for comfort measures RN to pronounce death   Please review prior notes for details, patient was in the ICU and managed as below.  #Undifferentiated Shock: Hypovolemic vs. ? Septic vs ? Obstructive #Small Pericardial Effusion   PMHx: CAD, HTN, HLD Echocardiogram 09/04/22: LVEF 55-60%, LV size normal, RV not well visualized, small pericardial effusion Patient was managed in the ICU, s/p Levophed for Pressure support.  Continue comfort measures for now.  #Concern for Sepsis of unknown etiology #Recent history of recurrent Left Eye Endophthalmitis & Enterococcus Faecalis UTI ~ PICC line and IV Ceftriaxone completed on 9/16 CT Angio chest: no pneumonia CT Abdomen/Pelvis: mild left colonic diverticulosis without evidence of diverticulitis  UA without evidence of UTI ID consulted, s/p empiric Cefepime and Vancomycin. Discontinued antibiotics, started comfort measures only on 10/10  #Acute Hypoxic Respiratory Failure in setting of Acute Metabolic Encephalopathy and multiple metabolic derangements PMHx: OSA CT Angio chest: negative for PE, mild cardiomegaly, emphysema, mild dependent atelectasis in bilateral LL, no focal consolidation or aspiration -Supplemental O2 as needed to maintain O2 sats >92% Bronchodilators prn   #AKI on CKD Stage III #Hypokalemia PMHx: Urinary retention Foley catheter placed for retention, continue Foley for comfort measures  #Hypoglycemia PMHx: Diabetes Mellitus Hold home Metformin and Glimepiride for now  Continue comfort measures now  #Acute Metabolic Encephalopathy #? Syncopal Episode PMHx: CVA Code Stroke called in ED: CT Head w/o contrast negative, CT Cervial Spine negative for acute fracture, CT Angio Head and Neck negative for Large Vessel Occlussion ~ evaluated by TeleNeurology and deemed not a candidate for thrombolytics or thrombectomy   Body mass index is 23.67 kg/m.  Nutrition Problem: Severe Malnutrition Etiology: chronic illness Interventions:  Pressure Injury 10/10/22 Buttocks Left Stage 2 -  Partial thickness loss of dermis presenting as a shallow open injury with a red, pink wound bed without slough. pink (Active)  10/10/22 1300  Location: Buttocks  Location  Orientation: Left  Staging: Stage 2 -  Partial thickness loss of dermis presenting as a shallow open injury with a red, pink wound bed without slough.  Wound Description (Comments): pink  Present on Admission: Yes  Dressing Type Foam - Lift dressing to assess site every shift 10/11/22 1345     Diet: Regular diet DVT Prophylaxis: Comfort measures  Advance goals of care discussion: Limited code comfort measures only  Family Communication: family was present at bedside, at the time of interview.  The pt provided permission to discuss medical plan with the family. Opportunity was given to ask question and all questions were answered satisfactorily.   Disposition:  Pt is from Home, admitted with fall, sepsis, AMS, patient was in the ICU, code disposition to DNR/DNI, due to poor prognosis, palliative care consulted.  Discussed with family and patient's wife agreed to move forward with comfort measures only and hospice placement. Follow TOC for hospice placement.   Subjective: No significant events overnight, in the morning time patient was confused and trying to get out of the bed.  Patient is AO x 2, laying in the bed and he was trying to get out of the bed, he is not aware of the situation. Patient wife was at bedside, she agreed with comfort measures only and hospice placement.  Physical Exam: General: NAD, lying comfortably but confused, trying to get out of the bed. Eyes: PERRLA, briefly opening and keeping it close mostly ENT: Oral Mucosa Clear, moist  Neck:  no JVD,  Cardiovascular: S1 and S2 Present, no Murmur,  Respiratory: good respiratory effort, Bilateral Air entry equal and Decreased, no Crackles, no wheezes Abdomen: Bowel Sound present, Soft and no tenderness,  Skin: no rashes Extremities: no Pedal edema, no calf tenderness Neurologic: without any new focal findings Gait not checked due to patient safety concerns  Vitals:   10/11/22 1100 10/11/22 1200 10/11/22 1300  10/11/22 1347  BP: 103/75 91/62 112/84 (!) 94/51  Pulse: 80 64 82 82  Resp:  12 15 20   Temp:  98.3 F (36.8 C)  97.6 F (36.4 C)  TempSrc:  Oral    SpO2: 100% 100% 99% 96%  Weight:      Height:        Intake/Output Summary (Last 24 hours) at 10/11/2022 1421 Last data filed at 10/11/2022 1317 Gross per 24 hour  Intake 3358.34 ml  Output 1635 ml  Net 1723.34 ml   Filed Weights   10/10/22 1010 10/10/22 1238 10/11/22 0500  Weight: 70.1 kg 71.7 kg 72.7 kg    Data Reviewed: I have personally reviewed and interpreted daily labs, tele strips, imagings as discussed above. I reviewed all nursing notes, pharmacy notes, vitals, pertinent old records I have discussed plan of care as described above with RN and patient/family.  CBC: Recent Labs  Lab 10/10/22 0625 10/11/22 0425  WBC 6.1 6.4  NEUTROABS 4.1  --   HGB 10.4* 9.5*  HCT 28.6* 27.3*  MCV 90.2 94.1  PLT 235 184   Basic Metabolic Panel: Recent Labs  Lab 10/10/22 0625 10/10/22 1441 10/10/22 1706 10/11/22 0425  NA 135 134* 133* 135  K 2.5* 3.2* 3.0* 3.3*  CL 101 104 106 106  CO2 22 21* 19* 22  GLUCOSE 87 110* 92 99  BUN 29* 23 21 16   CREATININE 1.43* 1.05 0.99 0.85  CALCIUM 8.3* 7.7* 7.8* 7.8*  MG 1.8  --   --  1.7  PHOS 4.0  --   --  2.5    Studies: No results found.  Scheduled Meds:  Chlorhexidine Gluconate Cloth  6 each Topical Daily   feeding supplement  237 mL Oral BID BM   Continuous Infusions: PRN Meds: acetaminophen **OR** acetaminophen, antiseptic oral rinse, docusate sodium, glycopyrrolate **OR** glycopyrrolate **OR** glycopyrrolate, haloperidol **OR** haloperidol **OR** haloperidol lactate, morphine injection, ondansetron **OR** ondansetron (ZOFRAN) IV, polyethylene glycol, polyvinyl alcohol  Time spent: 35 minutes  Author: Gillis Santa. MD [facility] 10/11/2022 2:21 PM  To reach On-call, see care teams to locate the attending and reach out to them via [email or web address]. If 7PM-7AM,  please contact night-coverage If you still have difficulty reaching the attending provider, please page the [facility] (Director on Call) for [facility] on amion for assistance.

## 2022-10-11 NOTE — Progress Notes (Signed)
Initial Nutrition Assessment  DOCUMENTATION CODES:   Severe malnutrition in context of chronic illness  INTERVENTION:   RD will monitor for GOC  Pt at high refeed risk  NUTRITION DIAGNOSIS:   Severe Malnutrition related to chronic illness as evidenced by severe fat depletion, severe muscle depletion, 21 percent weight loss in 6 months.  GOAL:   Patient will meet greater than or equal to 90% of their needs  MONITOR:   PO intake, Supplement acceptance, Labs, Weight trends, Skin, I & O's  REASON FOR ASSESSMENT:   Malnutrition Screening Tool    ASSESSMENT:   72 y/o male with h/o CVA, DM, HTN, CAD, GERD, PE, OSA, diverticulitis, DDD, BPH and recent recurrent left eye endophthalmitis and enterococcus faecalis UTI who is admitted with AKI, sepsis and FTT.  Met with pt and family in room today. Pt sleeping after receiving haldol and does not provide any history. Per family report, pt has been agitated. Pt's wife reports that for the past two months, patient has been dealing with numerous admissions secondary to UTI and issues with his eye. Pt has recently found out that he needs to have his eye removed and family reports that he has been depressed. Wife reports that patient is tired and is asking to "just die". Pt has been refusing to eat and family reports significant weight loss over the past two months. Per chart, pt is down 42lbs(21%) over the past 6 months; this is severe weight loss. Pt is refusing all supplements. RD discussed with family the recommendation for G-tube placement and nutrition support if full aggressive care is desired. Pt's wife reports that she feels as though pt would not want any feeding tubes. Family is leaning towards comfort care and letting pt eat whatever he wants. Palliative care consult is pending.   Medications reviewed and include: heparin, cefepime, vancomycin   Labs reviewed: K 3.3(L), P 2.5 wnl, Mg 1.7 wnl Folate- 4.9(L) Vitamin D 26.20(L)-  10/9 Hgb 9.5(L), Hct 27.3(L) Cbgs- 116, 117, 132, 105, 93, 58, 74 x 48 hrs  AIC 6.4(H)- 8/26  NUTRITION - FOCUSED PHYSICAL EXAM:  Flowsheet Row Most Recent Value  Orbital Region Moderate depletion  Upper Arm Region Severe depletion  Thoracic and Lumbar Region Moderate depletion  Buccal Region Moderate depletion  Temple Region Severe depletion  Clavicle Bone Region Severe depletion  Clavicle and Acromion Bone Region Severe depletion  Scapular Bone Region Moderate depletion  Dorsal Hand Moderate depletion  Patellar Region Severe depletion  Anterior Thigh Region Severe depletion  Posterior Calf Region Severe depletion  Edema (RD Assessment) Mild  Hair Reviewed  Eyes Reviewed  Mouth Reviewed  Skin Reviewed  Nails Reviewed   Diet Order:   Diet Order             Diet regular Room service appropriate? Yes; Fluid consistency: Thin  Diet effective now                  EDUCATION NEEDS:   No education needs have been identified at this time  Skin:  Skin Assessment: Reviewed RN Assessment (Stage II buttocks, skin tears)  Last BM:  PTA  Height:   Ht Readings from Last 1 Encounters:  10/10/22 5\' 9"  (1.753 m)    Weight:   Wt Readings from Last 1 Encounters:  10/11/22 72.7 kg    Ideal Body Weight:  72.7 kg  BMI:  Body mass index is 23.67 kg/m.  Estimated Nutritional Needs:   Kcal:  1900-2200kcal/day  Protein:  95-110g/day  Fluid:  1.9-2.2L/day  Betsey Holiday MS, RD, LDN Please refer to [facility] for RD and/or RD on-call/weekend/after hours pager

## 2022-10-11 NOTE — Consult Note (Signed)
Pharmacy Consult Note - Electrolytes  Sodium (mmol/L)  Date Value  10/11/2022 135  01/05/2014 132 (L)   Potassium (mmol/L)  Date Value  10/11/2022 3.3 (L)  01/05/2014 3.9   Magnesium (mg/dL)  Date Value  95/28/4132 1.7   Calcium (mg/dL)  Date Value  44/01/270 7.8 (L)   Calcium, Total (mg/dL)  Date Value  53/66/4403 9.2   Albumin (g/dL)  Date Value  47/42/5956 2.7 (L)  01/05/2014 2.3 (L)   Phosphorus (mg/dL)  Date Value  38/75/6433 2.5    ASSESSMENT: 72 y.o. male with PMH recent left eye endophthalmitis s/p vitrectomy and washout (08/2022), diabetic retinopathy, CAD, CHF who presents with sepsis. Wife heard thump in bathroom, found patient mumbling and when EMS arrived he was satting in the 59s. Patient being admitted to CCU with vasopressor requirements. Pharmacy has been consulted to monitor and replace electrolytes. Patient's renal function appears to be in AKI, up from baseline Scr 1.0-1.1.  Lab Results  Component Value Date   CREATININE 0.85 10/11/2022   CREATININE 0.99 10/10/2022   CREATININE 1.05 10/10/2022    mIVF: LR @ 100 mL/hr  Pertinent medications: N/A  Goal of Therapy:  [x]  Electrolytes WNL []  K >=4.0 and Mg >=2.0  PLAN: PO Kcl x 1 Check electrolytes with next AM labs   Thank you for allowing pharmacy to be a part of this patient's care.  Elliot Gurney, PharmD, BCPS Clinical Pharmacist  10/11/2022 7:31 AM

## 2022-10-11 NOTE — Evaluation (Signed)
Physical Therapy Evaluation Patient Details Name: Michael Humphrey MRN: [id number] DOB: May 28, 1950 Today's Date: 10/11/2022  History of Present Illness  72 year old male with past medical history significant for recent recurrent left eye endophthalmitis and Enterococcus faecalis UTI admitted with acute metabolic encephalopathy status post fall, undifferentiated shock, along with concern for sepsis currently of unknown etiology, and AKI on CKD. CT Maxillofacial: Mild acute right nasal bone fracture.   Clinical Impression  Pt received in Semi-Fowler's position and agreeable to therapy.  Pt seen for PT/OT evaluation upon arrival to the room.  Pt with significant weakness in the LE's when performing bed mobility, requiring MaxA for LE's to come into seated position.  Pt also unable to perform forward scoot (possibly due to the ICU bed being too soft), however he is able to come into standing position with CGA.  Pt able to ambulate within the room with CGA +2 for management of lines/leads.  Pt denies any pain or demonstration of any difficulty breathing although SpO2 was registering low w (80% at times).  Pleth was not consistent and likely to be skewed.  Pt transferred to recliner and was left with all needs met and call bell within reach.  Spouse and Sister-In-Law in room with pt with nursing arriving as well.  Continued PT services warranted following d/c from hospital for advancing gait mechanics and improved mobility in order to return to PLOF.        If plan is discharge home, recommend the following: A lot of help with walking and/or transfers;A lot of help with bathing/dressing/bathroom;Assistance with cooking/housework;Assistance with feeding;Direct supervision/assist for medications management;Assist for transportation;Help with stairs or ramp for entrance   Can travel by private vehicle        Equipment Recommendations None recommended by PT  Recommendations for Other Services        Functional Status Assessment Patient has had a recent decline in their functional status and demonstrates the ability to make significant improvements in function in a reasonable and predictable amount of time.     Precautions / Restrictions Precautions Precautions: Fall Restrictions Weight Bearing Restrictions: No      Mobility  Bed Mobility                    Transfers                        Ambulation/Gait                  Stairs            Wheelchair Mobility     Tilt Bed    Modified Rankin (Stroke Patients Only)       Balance Overall balance assessment: Needs assistance Sitting-balance support: No upper extremity supported, Feet supported Sitting balance-Leahy Scale: Good     Standing balance support: Single extremity supported, During functional activity Standing balance-Leahy Scale: Fair                               Pertinent Vitals/Pain Pain Assessment Pain Assessment: No/denies pain    Home Living Family/patient expects to be discharged to:: Private residence Living Arrangements: Spouse/significant other Available Help at Discharge: Family;Available 24 hours/day Type of Home: House Home Access: Ramped entrance       Home Layout: One level Home Equipment: Cane - single Information systems manager (2 wheels);Grab bars - toilet;Grab bars - tub/shower;Shower seat - built  in;Lift chair Additional Comments: pt's wife has macular degeneration    Prior Function Prior Level of Function : Independent/Modified Independent;Driving             Mobility Comments: Mod I uses RW or SPC as needed       Extremity/Trunk Assessment   Upper Extremity Assessment Upper Extremity Assessment: RUE deficits/detail RUE Deficits / Details: hx of shoulder surgery with ~90* AROM    Lower Extremity Assessment Lower Extremity Assessment: Generalized weakness       Communication    Communication Communication: Difficulty communicating thoughts/reduced clarity of speech Cueing Techniques: Verbal cues  Cognition Arousal: Alert Behavior During Therapy: Impulsive Overall Cognitive Status: Difficult to assess                                 General Comments: pt answers name and location, aware of situation. Perseverative on going home and requires encouragement to participate        General Comments General comments (skin integrity, edema, etc.): poor pleth however no shortness of breath reported on RA during mobility    Exercises     Assessment/Plan    PT Assessment Patient needs continued PT services  PT Problem List Decreased strength;Decreased activity tolerance;Decreased balance;Decreased mobility;Decreased cognition;Decreased knowledge of use of DME;Decreased safety awareness       PT Treatment Interventions DME instruction;Gait training;Functional mobility training;Therapeutic activities;Therapeutic exercise;Balance training;Neuromuscular re-education;Patient/family education    PT Goals (Current goals can be found in the Care Plan section)  Acute Rehab PT Goals Patient Stated Goal: To go back home PT Goal Formulation: With patient Time For Goal Achievement: 10/25/22 Potential to Achieve Goals: Fair    Frequency Min 1X/week     Co-evaluation PT/OT/SLP Co-Evaluation/Treatment: Yes Reason for Co-Treatment: Complexity of the patient's impairments (multi-system involvement);For patient/therapist safety;To address functional/ADL transfers PT goals addressed during session: Mobility/safety with mobility;Balance;Proper use of DME;Strengthening/ROM OT goals addressed during session: ADL's and self-care;Proper use of Adaptive equipment and DME;Strengthening/ROM       AM-PAC PT "6 Clicks" Mobility  Outcome Measure Help needed turning from your back to your side while in a flat bed without using bedrails?: A Little Help needed moving from  lying on your back to sitting on the side of a flat bed without using bedrails?: A Little Help needed moving to and from a bed to a chair (including a wheelchair)?: A Lot Help needed standing up from a chair using your arms (e.g., wheelchair or bedside chair)?: A Little Help needed to walk in hospital room?: A Lot Help needed climbing 3-5 steps with a railing? : Total 6 Click Score: 14    End of Session   Activity Tolerance: Patient tolerated treatment well Patient left: in chair;with call bell/phone within reach;with family/visitor present;with nursing/sitter in room Nurse Communication: Mobility status PT Visit Diagnosis: Unsteadiness on feet (R26.81);Other abnormalities of gait and mobility (R26.89);Muscle weakness (generalized) (M62.81);History of falling (Z91.81);Difficulty in walking, not elsewhere classified (R26.2)    Time: 1610-9604 PT Time Calculation (min) (ACUTE ONLY): 19 min   Charges:   PT Evaluation $PT Eval Low Complexity: 1 Low   PT General Charges $$ ACUTE PT VISIT: 1 Visit         Nolon Bussing, PT, DPT Physical Therapist - [facility]  10/11/22, 11:57 AM

## 2022-10-11 NOTE — Evaluation (Signed)
Occupational Therapy Evaluation Patient Details Name: Michael Humphrey MRN: [id number] DOB: 1950/08/12 Today's Date: 10/11/2022   History of Present Illness 72 year old male with past medical history significant for recent recurrent left eye endophthalmitis and Enterococcus faecalis UTI admitted with acute metabolic encephalopathy status post fall, undifferentiated shock, along with concern for sepsis currently of unknown etiology, and AKI on CKD. CT Maxillofacial: Mild acute right nasal bone fracture.   Clinical Impression   Mr Mcknight was seen for OT/PT evaluation this date. Prior to hospital admission, pt was MOD I. Pt lives with spouse. Pt oriented however perseverative on going home and requires encouragement to participate. Pt currently requires MAX A for LB access seated EOB. CGA + RW for simulated toilet t/f - cues to navigate environmnet 2/2 basleine visual deficits and +2 for lines mgmt 2/2 impulsivity. MIN A don gown in standing, requires single UE support for balance. Poor pleth for O2 however no shortness of breath reported on RA during mobility.  Pt would benefit from skilled OT to address noted impairments and functional limitations (see below for any additional details). Upon hospital discharge, recommend OT follow up and assistance for mobility/ADLs.    If plan is discharge home, recommend the following: A little help with walking and/or transfers;A little help with bathing/dressing/bathroom;Help with stairs or ramp for entrance;Assistance with cooking/housework    Functional Status Assessment  Patient has had a recent decline in their functional status and demonstrates the ability to make significant improvements in function in a reasonable and predictable amount of time.  Equipment Recommendations  BSC/3in1    Recommendations for Other Services       Precautions / Restrictions Precautions Precautions: Fall Restrictions Weight Bearing Restrictions: No       Mobility Bed Mobility Overal bed mobility: Needs Assistance Bed Mobility: Supine to Sit     Supine to sit: Min assist, +2 for physical assistance, HOB elevated          Transfers Overall transfer level: Needs assistance Equipment used: Rolling Bettenhausen (2 wheels) Transfers: Sit to/from Stand Sit to Stand: Contact guard assist, From elevated surface, +2 safety/equipment                  Balance Overall balance assessment: Needs assistance Sitting-balance support: No upper extremity supported, Feet supported Sitting balance-Leahy Scale: Good     Standing balance support: Single extremity supported, During functional activity Standing balance-Leahy Scale: Fair                             ADL either performed or assessed with clinical judgement   ADL Overall ADL's : Needs assistance/impaired                                       General ADL Comments: MAX A for LB access seated EOB. CGA + RW for simulated toilet t/f - cues to navigate environmnet 2/2 basleine visual deficits and +2 for lines mgmt 2/2 impulsivity. MIN A don gown in standing, requires single UE support for balance.       Pertinent Vitals/Pain Pain Assessment Pain Assessment: No/denies pain     Extremity/Trunk Assessment Upper Extremity Assessment Upper Extremity Assessment: RUE deficits/detail RUE Deficits / Details: hx of shoulder surgery with ~90* AROM   Lower Extremity Assessment Lower Extremity Assessment: Generalized weakness       Communication Communication Communication:  Difficulty communicating thoughts/reduced clarity of speech Cueing Techniques: Verbal cues   Cognition Arousal: Alert Behavior During Therapy: Impulsive Overall Cognitive Status: Difficult to assess                                 General Comments: pt answers name and location, aware of situation. Perseverative on going home and requires encouragement to participate      General Comments  poor pleth however no shortness of breath reported on RA during mobility            Home Living Family/patient expects to be discharged to:: Private residence Living Arrangements: Spouse/significant other Available Help at Discharge: Family;Available 24 hours/day Type of Home: House Home Access: Ramped entrance     Home Layout: One level     Bathroom Shower/Tub: Producer, television/film/video: Handicapped height     Home Equipment: Cane - single Information systems manager (2 wheels);Grab bars - toilet;Grab bars - tub/shower;Shower seat - built in;Lift chair   Additional Comments: pt's wife has macular degeneration      Prior Functioning/Environment Prior Level of Function : Independent/Modified Independent;Driving             Mobility Comments: Mod I uses RW or SPC as needed          OT Problem List: Decreased strength;Decreased range of motion;Impaired balance (sitting and/or standing);Decreased activity tolerance;Decreased safety awareness      OT Treatment/Interventions: Self-care/ADL training;Therapeutic exercise;Energy conservation;DME and/or AE instruction;Therapeutic activities;Patient/family education;Balance training    OT Goals(Current goals can be found in the care plan section) Acute Rehab OT Goals Patient Stated Goal: to go home OT Goal Formulation: With patient/family Time For Goal Achievement: 10/25/22 Potential to Achieve Goals: Fair ADL Goals Pt Will Perform Grooming: with modified independence;sitting Pt Will Perform Lower Body Dressing: with modified independence;sit to/from stand Pt Will Transfer to Toilet: with modified independence;ambulating;regular height toilet  OT Frequency: Min 1X/week    Co-evaluation PT/OT/SLP Co-Evaluation/Treatment: Yes Reason for Co-Treatment: Complexity of the patient's impairments (multi-system involvement);For patient/therapist safety;To address functional/ADL transfers PT goals  addressed during session: Mobility/safety with mobility;Balance;Proper use of DME;Strengthening/ROM OT goals addressed during session: ADL's and self-care;Proper use of Adaptive equipment and DME;Strengthening/ROM      AM-PAC OT "6 Clicks" Daily Activity     Outcome Measure Help from another person eating meals?: None Help from another person taking care of personal grooming?: A Little Help from another person toileting, which includes using toliet, bedpan, or urinal?: A Little Help from another person bathing (including washing, rinsing, drying)?: A Lot Help from another person to put on and taking off regular upper body clothing?: A Little Help from another person to put on and taking off regular lower body clothing?: A Lot 6 Click Score: 17   End of Session Equipment Utilized During Treatment: Rolling Hoffmeier (2 wheels) Nurse Communication: Mobility status  Activity Tolerance: Patient tolerated treatment well Patient left: in chair;with call bell/phone within reach;with family/visitor present;with nursing/sitter in room  OT Visit Diagnosis: Other abnormalities of gait and mobility (R26.89);Muscle weakness (generalized) (M62.81)                Time: 6578-4696 OT Time Calculation (min): 19 min Charges:  OT General Charges $OT Visit: 1 Visit OT Evaluation $OT Eval Moderate Complexity: 1 Mod  Kathie Dike, M.S. OTR/L  10/11/22, 11:33 AM  ascom [phone]

## 2022-10-11 NOTE — Progress Notes (Signed)
1100-Pt is getting agitated. He sts "I just want to go home."  Pt is trying to get up out of the chair. Pt is yelling at his wife saying "Lets go". M.D. was made aware and orders received. Pt is alert and oriented x2. Pt. Is very unstable when standing.

## 2022-10-11 NOTE — TOC Initial Note (Signed)
Transition of Care [facility]) - Initial/Assessment Note    Patient Details  Name: Michael Humphrey MRN: [id number] Date of Birth: 1950/01/21  [facility]) CM/SW Contact:    Allena Katz, LCSW Phone Number: 10/11/2022, 1:53 PM  Clinical Narrative:   Pt placed on full comfort measures. Wife reports she wants [facility] house. Referral made.                       Patient Goals and CMS Choice            Expected Discharge Plan and Services                                              Prior Living Arrangements/Services                       Activities of Daily Living      Permission Sought/Granted                  Emotional Assessment              Admission diagnosis:  Hypoglycemia [E16.2] Difficulty speaking [R47.9] Fall, initial encounter [W19.XXXA] Acute metabolic encephalopathy [G93.41] Patient Active Problem List   Diagnosis Date Noted   Acute metabolic encephalopathy 10/10/2022   Left endophthalmia 10/10/2022   Ophthalmitis 08/27/2022   DM (diabetes mellitus) with complications (HCC) 08/27/2022   HTN (hypertension) 08/27/2022   Acute left flank pain 08/27/2022   Frontal sinus pain 08/27/2022   CAD (coronary artery disease) 08/27/2022   S/P lumbar fusion 03/19/2022   Chronic bilateral low back pain with left-sided sciatica 03/19/2022   Rectal bleeding    Polyp of descending colon    Posterior capsular opacification, left 09/26/2020   Peripheral retinal hole, right 12/10/2019   Lower limb ulcer, calf, right, limited to breakdown of skin (HCC) 09/04/2019   Severe nonproliferative diabetic retinopathy of right eye, with macular edema, associated with type 2 diabetes mellitus (HCC) 05/26/2019   Severe nonproliferative diabetic retinopathy of left eye, with macular edema, associated with type 2 diabetes mellitus (HCC) 05/26/2019   Hematuria, gross    Pulmonary embolism (HCC) 02/26/2018   Dizziness  02/26/2018   CVA (cerebral vascular accident) (HCC) 07/15/2017   PCP:  Lauro Regulus, MD Pharmacy:   [facility] - [place], [place] - [address] [place] [address] Phone: [phone] Fax: [phone]  [facility] [address] [place] [address] Phone: [phone] Fax: [phone]     Social Determinants of Health (SDOH) Social History: SDOH Screenings   Food Insecurity: No Food Insecurity (08/27/2022)  Housing: Low Risk  (08/27/2022)  Transportation Needs: No Transportation Needs (08/27/2022)  Utilities: Not At Risk (08/27/2022)  Depression (PHQ2-9): Low Risk  (09/12/2022)  Financial Resource Strain: Low Risk  (04/02/2022)   Received from [facility]  Tobacco Use: Medium Risk (10/10/2022)   SDOH Interventions:     Readmission Risk Interventions     No data to display

## 2022-10-11 NOTE — Consult Note (Addendum)
Pharmacy Antibiotic Note  Michael Humphrey is a 72 y.o. male admitted on 10/10/2022 with sepsis. Admitted due to fall, possible syncope and slurred speech. Patient has a history significant for left eye endophthalimitis. Had an emergent vitrectomy 8/26. Source unknown, but suspected endogenous. Cultures from left eye washing 2/2 negative for growth. Notably urine cultures positive for enterococcus faecalis. No blood cultures. Was treated with PO linezolid and cipro at discharge. Readmitted to [facility] 8/31 and was started on IV ceftriaxone and PO linezolid which ended on 9/11. Pharmacy has been consulted for Cefepime and Vancomycin dosing.  Today, 10/11/22 Renal function Scr: 0.85 (baseline); 1.43 on admission WBC WNL Afebrile 10/9 Bcx and Ucx NG 10/9 CT Pelvis: femur arthroplasty hardware  10/9 CT Head: no acute findings  Plan: Adjusted Cefepime to 2g Q8H due to renal improvement Adjusted Vancomycin 1000 mg BID due to renal improvement  Goal AUC 400-600. Expected AUC: 534.2  Expected Css min: 15.1 SCr used: 0.85  Weight used: IBW, Vd used: 0.72 (BMI ~24) Continue to monitor renal function and follow culture results  Height: 5\' 9"  (175.3 cm) Weight: 72.7 kg (160 lb 4.4 oz) IBW/kg (Calculated) : 70.7  Temp (24hrs), Avg:98 F (36.7 C), Min:97.4 F (36.3 C), Max:98.6 F (37 C)  Recent Labs  Lab 10/10/22 0625 10/10/22 0853 10/10/22 1441 10/10/22 1706 10/11/22 0425  WBC 6.1  --   --   --  6.4  CREATININE 1.43*  --  1.05 0.99 0.85  LATICACIDVEN  --  1.3  --   --   --     Estimated Creatinine Clearance: 80.9 mL/min (by C-G formula based on SCr of 0.85 mg/dL).    Allergies  Allergen Reactions   Clonidine Derivatives     Hallucinations    Fioricet [Butalbital-Apap-Caffeine]     hallucination   Hydrocodone-Acetaminophen     hallucinations   Mobic [Meloxicam]     hallucinations   Norvasc [Amlodipine Besylate]     hallucinations   Statins     Muscle cramps   Tramadol  Itching    Antimicrobials this admission: D2 Cefepime >>  D2 Vancomycin >>  10/9 Metronidazole 500 mg IV x1  Dose adjustments this admission: Vancomycin 1g IV Q24H Cefepime 2g IV Q12H  Microbiology results: 10/9 BCx: NGTD 10/9 UCx: NG  10/9 MRSA PCR: negative  Thank you for allowing pharmacy to be a part of this patient's care.  Effie Shy PGY1 Pharmacy Resident 10/11/2022 12:52 PM

## 2022-10-11 NOTE — Discharge Summary (Signed)
[facility] Discharge Summary   Patient: Michael Humphrey WUJ:[id number]  PCP: Lauro Regulus, MD  Date of admission: 10/10/2022   Date of discharge:  10/11/2022     Discharge Diagnoses:  Principal Problem:   Acute metabolic encephalopathy Active Problems:   Left endophthalmia   Protein-calorie malnutrition, severe   Admitted From: Home Disposition:  Residential hospice   Recommendations for Outpatient Follow-up:  Follow hospice care Follow up LABS/TEST: None   Diet recommendation: Regular diet  Activity: The patient is advised to gradually reintroduce usual activities, as tolerated  Discharge Condition: stable  Code Status: Limited code comfort measures only  History of present illness: As per the H and P dictated on admission Hospital Course:  72 year old male with past medical history significant for recent recurrent left eye endophthalmitis and Enterococcus faecalis UTI admitted with acute metabolic encephalopathy status post fall, undifferentiated shock, along with concern for sepsis currently of unknown etiology, and AKI on CKD.   presented to the ED s/p fall with Altered Mental Status at home this morning. Past medical history significant for HTN, CAD, DM, HLD, and left eye endophthalmitis. Per wife report, his last known well time was around 10:00 PM last night, and this morning upon awakening she heard the patient fall in other room, and is unsure whether he tripped or had a syncopal episode. EMS report states upon arrival, O2 sats were in 50s and patient was unresponsive and experiencing periods of apnea. Patient reports pain in left eye and neck tenderness.     His wife reports he seemed more confused yesterday evening, but she denies knowledge of recent fevers, chills, headache, chest pain, SOB, abdominal pain, N/V/D, dysuria, wounds. She does report poor intake and weight loss of approximately 20 to 30 pounds over the past few months, and that he has  had significant urinary retention.   Of note the patient was recently treated at [facility] on 08/27/22 for left endophthalmitis on 08/27/2022 of which he underwent surgical Vitrectomy.  He was also found to have Enterococcus Faecalis UTI.   He then returned to the ED at [facility] on 09/01/2022 with persistent left eye pain and headache.  CT imaging without abscess.  He was offered Surgery on 9/3 for [facility] washout and PPV but he ended up declining surgery. There was concern for undiagnosed systemic infection, therefore he was discharged on PO Linezolid and with PICC line for IV ceftriaxone (completion date 9/16).  He has been following closely with ophthalmology and infectious disease with tentative plan for removal of left eye on Friday 10/12/22 at [facility].   Code stroke was called to the ED, workup negative, out of window for tPA.  Patient became hypotensive and hypoglycemic, did not improve after fluid resuscitation.  Patient was started on peripheral Levophed, transferred to ICU for further management.  ID was consulted for antibiotic management.  Patient was made DNR/DNI and downgraded from ICU to hospitalist service on 10/11/2022.   10/10 patient is very confused, altered mental status, trying to get out of the bed.  AAO x 2, discussed goals of care with the patient's wife who agreed with comfort measures only and hospice placement.  Discussed with palliative care and TOC for hospice placement Comfort measure orders placed and discontinued all the other medications   10/10 patient got excepted for inpatient hospice facility still patient will be transferred today around 9 PM.  Appropriate to discharge for inpatient hospice.  Assessment and Plan:   Comfort measures only Patient  presented with altered mental status, fall most likely due to septic shock.  Unknown source of infection. After discussion with patient's wife, comfort measures orders placed and discontinued antibiotics.  Patient was  transferred to floor for comfort measures Palliative care consulted and [facility] informed for hospice placement Continue as needed medication for comfort measures RN to pronounce death     Please review prior notes for details, patient was in the ICU and managed as below.  #Undifferentiated Shock: Hypovolemic vs. ? Septic vs ? Obstructive #Small Pericardial Effusion  PMHx: CAD, HTN, HLD Echocardiogram 09/04/22: LVEF 55-60%, LV size normal, RV not well visualized, small pericardial effusion Patient was managed in the ICU, s/p Levophed for Pressure support.  Continue comfort measures for now.  #Concern for Sepsis of unknown etiology #Recent history of recurrent Left Eye Endophthalmitis & Enterococcus Faecalis UTI ~ PICC line and IV Ceftriaxone completed on 9/16 CT Angio chest: no pneumonia CT Abdomen/Pelvis: mild left colonic diverticulosis without evidence of diverticulitis  UA without evidence of UTI ID consulted, s/p empiric Cefepime and Vancomycin. Discontinued antibiotics, started comfort measures only on 10/10  #Acute Hypoxic Respiratory Failure in setting of Acute Metabolic Encephalopathy and multiple metabolic derangements PMHx: OSA CT Angio chest: negative for PE, mild cardiomegaly, emphysema, mild dependent atelectasis in bilateral LL, no focal consolidation or aspiration -Supplemental O2 as needed to maintain O2 sats >92% Bronchodilators prn   #AKI on CKD Stage III #Hypokalemia PMHx: Urinary retention Foley catheter placed for retention, continue Foley for comfort measures  #Hypoglycemia PMHx: Diabetes Mellitus Hold home Metformin and Glimepiride for now  Continue comfort measures now   #Acute Metabolic Encephalopathy #? Syncopal Episode PMHx: CVA Code Stroke called in ED: CT Head w/o contrast negative, CT Cervial Spine negative for acute fracture, CT Angio Head and Neck negative for Large Vessel Occlussion ~ evaluated by Tele[facility] and deemed not a candidate for  thrombolytics or thrombectomy    Body mass index is 23.67 kg/m.  Nutrition Problem: Severe Malnutrition Etiology: chronic illness Nutrition Interventions:  Pressure Injury 10/10/22 Buttocks Left Stage 2 -  Partial thickness loss of dermis presenting as a shallow open injury with a red, pink wound bed without slough. pink (Active)  10/10/22 1300  Location: Buttocks  Location Orientation: Left  Staging: Stage 2 -  Partial thickness loss of dermis presenting as a shallow open injury with a red, pink wound bed without slough.  Wound Description (Comments): pink  Present on Admission: Yes  Dressing Type Foam - Lift dressing to assess site every shift 10/11/22 1345      Consultants: Pulmonary critical care, ID, palliative care Procedures: None  Discharge Exam: General: NAD, lying comfortably but confused, trying to get out of the bed. Eyes: PERRLA, briefly opening and keeping it close mostly ENT: Oral Mucosa Clear, moist  Neck: no JVD,  Cardiovascular: S1 and S2 Present, no Murmur,  Respiratory: good respiratory effort, Bilateral Air entry equal and Decreased, no Crackles, no wheezes Abdomen: Bowel Sound present, Soft and no tenderness,  Skin: no rashes Extremities: no Pedal edema, no calf tenderness Neurologic: without any new focal findings Gait not checked due to patient safety concerns  Filed Weights   10/10/22 1010 10/10/22 1238 10/11/22 0500  Weight: 70.1 kg 71.7 kg 72.7 kg   Vitals:   10/11/22 1300 10/11/22 1347  BP: 112/84 (!) 94/51  Pulse: 82 82  Resp: 15 20  Temp:  97.6 F (36.4 C)  SpO2: 99% 96%    DISCHARGE MEDICATION: Allergies as of  10/11/2022       Reactions   Clonidine Derivatives    Hallucinations   Fioricet [butalbital-apap-caffeine]    hallucination   Hydrocodone-acetaminophen    hallucinations   Mobic [meloxicam]    hallucinations   Norvasc [amlodipine Besylate]    hallucinations   Statins    Muscle cramps   Tramadol Itching         Medication List     STOP taking these medications    aspirin EC 81 MG tablet   carvedilol 12.5 MG tablet Commonly known as: COREG   finasteride 5 MG tablet Commonly known as: PROSCAR   gabapentin 600 MG tablet Commonly known as: NEURONTIN   glimepiride 1 MG tablet Commonly known as: AMARYL   lisinopril 2.5 MG tablet Commonly known as: ZESTRIL   metFORMIN 500 MG tablet Commonly known as: GLUCOPHAGE   methocarbamol 750 MG tablet Commonly known as: ROBAXIN   mirtazapine 7.5 MG tablet Commonly known as: REMERON   rosuvastatin 5 MG tablet Commonly known as: CRESTOR   senna 8.6 MG Tabs tablet Commonly known as: SENOKOT   tamsulosin 0.4 MG Caps capsule Commonly known as: FLOMAX   torsemide 20 MG tablet Commonly known as: DEMADEX       TAKE these medications    acetaminophen 500 MG tablet Commonly known as: TYLENOL Take 1,000 mg by mouth every 6 (six) hours as needed.               Discharge Care Instructions  (From admission, onward)           Start     Ordered   10/11/22 0000  Discharge wound care:       Comments: As Above   10/11/22 1630           Allergies  Allergen Reactions   Clonidine Derivatives     Hallucinations    Fioricet [Butalbital-Apap-Caffeine]     hallucination   Hydrocodone-Acetaminophen     hallucinations   Mobic [Meloxicam]     hallucinations   Norvasc [Amlodipine Besylate]     hallucinations   Statins     Muscle cramps   Tramadol Itching   Discharge Instructions     Diet general   Complete by: As directed    Discharge instructions   Complete by: As directed    Continue hospice care   Discharge wound care:   Complete by: As directed    As Above       The results of significant diagnostics from this hospitalization (including imaging, microbiology, ancillary and laboratory) are listed below for reference.    Significant Diagnostic Studies: CT Angio Chest PE W and/or Wo Contrast  Result Date:  10/10/2022 CLINICAL DATA:  Found down, hypoxia EXAM: CT ANGIOGRAPHY CHEST CT ABDOMEN AND PELVIS WITH CONTRAST TECHNIQUE: Multidetector CT imaging of the chest was performed using the standard protocol during bolus administration of intravenous contrast. Multiplanar CT image reconstructions and MIPs were obtained to evaluate the vascular anatomy. Multidetector CT imaging of the abdomen and pelvis was performed using the standard protocol during bolus administration of intravenous contrast. RADIATION DOSE REDUCTION: This exam was performed according to the departmental dose-optimization program which includes automated exposure control, adjustment of the mA and/or kV according to patient size and/or use of iterative reconstruction technique. CONTRAST:  OMNIPAQUE IOHEXOL 350 MG/ML SOLN COMPARISON:  CT abdomen dated 08/27/2022. CT abdomen/pelvis dated 02/27/2018. CTA chest dated 02/26/2018. FINDINGS: CTA CHEST FINDINGS Cardiovascular: Satisfactory opacification of the bilateral pulmonary arteries to  the segmental level. No evidence of pulmonary embolism. Although not tailored for evaluation of the thoracic aorta, there is no evidence of thoracic aortic aneurysm or dissection. Atherosclerotic calcifications of the aortic arch. Mild cardiomegaly.  Small pericardial effusion. Moderate three-vessel coronary atherosclerosis. Mediastinum/Nodes: No suspicious mediastinal lymphadenopathy. Visualized thyroid unremarkable. Lungs/Pleura: Mild centrilobular emphysematous changes, upper lung predominant. Mild dependent atelectasis in the bilateral lower lobes. No focal consolidation or aspiration. No suspicious pulmonary nodules. No pleural effusion or pneumothorax. Musculoskeletal: Degenerative changes of the thoracic spine. Review of the MIP images confirms the above findings. CT ABDOMEN and PELVIS FINDINGS Hepatobiliary: Subcentimeter right hepatic cyst (series 14/image 10), benign. Status post cholecystectomy. No  intrahepatic or extrahepatic ductal dilatation. Pancreas: Within normal limits. Spleen: Within normal limits. Adrenals/Urinary Tract: Adrenal glands are within normal limits. Subcentimeter bilateral renal cysts, too small to characterize, almost certainly benign. No follow-up is recommended. No hydronephrosis. Bladder is within normal limits. Stomach/Bowel: Stomach is within normal limits. No evidence of bowel obstruction. Left colonic diverticulosis, without evidence of diverticulitis. Vascular/Lymphatic: No evidence of abdominal aortic aneurysm. Atherosclerotic calcifications of the abdominal aorta and branch vessels, although vessels remain patent. No suspicious abdominopelvic lymphadenopathy. Reproductive: Prostate is poorly evaluated due to streak artifact. Other: No abdominopelvic ascites. Musculoskeletal: Degenerative changes of the visualized thoracolumbar spine. Status post PLIF at L4-S1. Bilateral hip arthroplasties. Review of the MIP images confirms the above findings. IMPRESSION: No evidence of pulmonary embolism. Mild cardiomegaly with small pericardial effusion. Left colonic diverticulosis, without evidence of diverticulitis. Aortic Atherosclerosis (ICD10-I70.0) and Emphysema (ICD10-J43.9). Electronically Signed   By: Charline Bills M.D.   On: 10/10/2022 08:34   CT ABDOMEN PELVIS W CONTRAST  Result Date: 10/10/2022 CLINICAL DATA:  Found down, hypoxia EXAM: CT ANGIOGRAPHY CHEST CT ABDOMEN AND PELVIS WITH CONTRAST TECHNIQUE: Multidetector CT imaging of the chest was performed using the standard protocol during bolus administration of intravenous contrast. Multiplanar CT image reconstructions and MIPs were obtained to evaluate the vascular anatomy. Multidetector CT imaging of the abdomen and pelvis was performed using the standard protocol during bolus administration of intravenous contrast. RADIATION DOSE REDUCTION: This exam was performed according to the departmental dose-optimization program  which includes automated exposure control, adjustment of the mA and/or kV according to patient size and/or use of iterative reconstruction technique. CONTRAST:  OMNIPAQUE IOHEXOL 350 MG/ML SOLN COMPARISON:  CT abdomen dated 08/27/2022. CT abdomen/pelvis dated 02/27/2018. CTA chest dated 02/26/2018. FINDINGS: CTA CHEST FINDINGS Cardiovascular: Satisfactory opacification of the bilateral pulmonary arteries to the segmental level. No evidence of pulmonary embolism. Although not tailored for evaluation of the thoracic aorta, there is no evidence of thoracic aortic aneurysm or dissection. Atherosclerotic calcifications of the aortic arch. Mild cardiomegaly.  Small pericardial effusion. Moderate three-vessel coronary atherosclerosis. Mediastinum/Nodes: No suspicious mediastinal lymphadenopathy. Visualized thyroid unremarkable. Lungs/Pleura: Mild centrilobular emphysematous changes, upper lung predominant. Mild dependent atelectasis in the bilateral lower lobes. No focal consolidation or aspiration. No suspicious pulmonary nodules. No pleural effusion or pneumothorax. Musculoskeletal: Degenerative changes of the thoracic spine. Review of the MIP images confirms the above findings. CT ABDOMEN and PELVIS FINDINGS Hepatobiliary: Subcentimeter right hepatic cyst (series 14/image 10), benign. Status post cholecystectomy. No intrahepatic or extrahepatic ductal dilatation. Pancreas: Within normal limits. Spleen: Within normal limits. Adrenals/Urinary Tract: Adrenal glands are within normal limits. Subcentimeter bilateral renal cysts, too small to characterize, almost certainly benign. No follow-up is recommended. No hydronephrosis. Bladder is within normal limits. Stomach/Bowel: Stomach is within normal limits. No evidence of bowel obstruction. Left colonic diverticulosis, without  evidence of diverticulitis. Vascular/Lymphatic: No evidence of abdominal aortic aneurysm. Atherosclerotic calcifications of the abdominal aorta  and branch vessels, although vessels remain patent. No suspicious abdominopelvic lymphadenopathy. Reproductive: Prostate is poorly evaluated due to streak artifact. Other: No abdominopelvic ascites. Musculoskeletal: Degenerative changes of the visualized thoracolumbar spine. Status post PLIF at L4-S1. Bilateral hip arthroplasties. Review of the MIP images confirms the above findings. IMPRESSION: No evidence of pulmonary embolism. Mild cardiomegaly with small pericardial effusion. Left colonic diverticulosis, without evidence of diverticulitis. Aortic Atherosclerosis (ICD10-I70.0) and Emphysema (ICD10-J43.9). Electronically Signed   By: Charline Bills M.D.   On: 10/10/2022 08:34   CT ANGIO HEAD NECK W WO CM (CODE STROKE)  Result Date: 10/10/2022 CLINICAL DATA:  72 year old male found down in bathroom. Hypoxia. "Code stroke". EXAM: CT ANGIOGRAPHY HEAD AND NECK WITH AND WITHOUT CONTRAST TECHNIQUE: Multidetector CT imaging of the head and neck was performed using the standard protocol during bolus administration of intravenous contrast. Multiplanar CT image reconstructions and MIPs were obtained to evaluate the vascular anatomy. Carotid stenosis measurements (when applicable) are obtained utilizing NASCET criteria, using the distal internal carotid diameter as the denominator. RADIATION DOSE REDUCTION: This exam was performed according to the departmental dose-optimization program which includes automated exposure control, adjustment of the mA and/or kV according to patient size and/or use of iterative reconstruction technique. CONTRAST:  OMNIPAQUE IOHEXOL 350 MG/ML SOLN COMPARISON:  Head face and cervical spine CT today at 0644 hours. FINDINGS: CTA NECK Skeleton: Head face and cervical spine detailed separately today. Negative visible upper thoracic osseous structures. Upper chest: Negative. Other neck: No acute finding. Aortic arch: Tortuous aortic arch with calcified atherosclerosis (series 1, image  174). Three vessel arch configuration. Right carotid system: Tortuous brachiocephalic artery with soft and calcified plaque but no significant stenosis. Soft plaque at the right CCA origin without stenosis. Bulky calcified plaque of the right CCA at the level of the larynx resulting in 50 % stenosis with respect to the distal vessel (series 1, image 125). Continued lesser soft and calcified plaque to the right carotid bifurcation. And similar bifurcation and proximal right ICA soft and calcified plaque but less than 50 % stenosis with respect to the distal vessel. Tortuous right ICA just below the skull base. Left carotid system: Similar left CCA origin, widespread left CCA and left carotid bifurcation atherosclerosis (including conspicuous soft plaque of the proximal left CCA in the superior mediastinum on series 1, image 154), but less than 50 % stenosis with respect to the distal vessel. Vertebral arteries: Proximal right subclavian artery mostly soft plaque without stenosis. Calcified plaque at the right vertebral artery origin without stenosis. Otherwise patent and normal right vertebral to the skull base. Proximal left subclavian artery soft and calcified plaque without stenosis. Calcified plaque at the left vertebral artery origin results in moderate to severe stenosis on series 6, image 136. The left vertebral remains patent and is only mildly non dominant. No additional left vertebral plaque or stenosis to the skull base. CTA HEAD Posterior circulation: Patent distal vertebral arteries with mild calcified atherosclerosis and no significant stenosis. Fairly codominant V4 segments. Normal right PICA and dominant appearing left AICA. Patent basilar artery with mild irregularity, no significant stenosis. Patent SCA and PCA origins. Posterior communicating arteries are diminutive or absent. Anterior circulation: Bilateral ICA siphons are patent. Left siphon moderate calcified plaque and mild to moderate  cavernous and supraclinoid segment stenosis. Contralateral right siphon moderate cavernous and supraclinoid segment calcified plaque, only mild stenosis results. Patent carotid  termini, MCA and ACA origins. Mildly tortuous A1 segments. Diminutive or absent anterior communicating artery. Right ACA A2 segment and branches remain patent and within normal limits. But there are tandem severe stenoses in the left A2 mid and distal A2 segment ultimately resulting in thrombosis of the distal A2 on series 10, image 26. Left MCA M1 segment and bifurcation are patent without stenosis. Right MCA M1 segment and bifurcation are patent without stenosis. Bilateral MCA branches are within normal limits. Venous sinuses: Early contrast timing, not well evaluated. Anatomic variants: None. Review of the MIP images confirms the above findings IMPRESSION: 1. Positive for Left ACA distal A2 segment thrombosis, with upstream tandem severe stenoses beginning in the mid A2 segment. 2. But no superimposed large vessel occlusion, despite extensive atherosclerosis throughout the head and neck: - up to Severe stenosis of the Left Vertebral Artery origin. - up to Moderate left ICA siphon stenosis due to calcified plaque. - Aortic Atherosclerosis (ICD10-I70.0). Electronically Signed   By: Odessa Fleming M.D.   On: 10/10/2022 08:08   DG Pelvis Portable  Result Date: 10/10/2022 CLINICAL DATA:  72 year old male found down in bathroom. Hypoxia. EXAM: PORTABLE PELVIS 1-2 VIEWS COMPARISON:  CT Abdomen and Pelvis 09/01/2009. CT Abdomen and Pelvis 02/27/2018. abdomen CT 08/27/2022. FINDINGS: Partially visible lower lumbar and lumbosacral junction posterior and interbody fusion hardware. Chronic bilateral hip arthroplasty, present in 2020. Bone mineralization is within normal limits for age. The pelvis appears stable and intact. Incompletely visible bilateral proximal femur arthroplasty hardware. Negative visible bowel gas. IMPRESSION: No acute fracture or  dislocation identified about the pelvis. Partially visible chronic bilateral hip arthroplasty. Electronically Signed   By: Odessa Fleming M.D.   On: 10/10/2022 07:07   CT Cervical Spine Wo Contrast  Result Date: 10/10/2022 CLINICAL DATA:  72 year old male found down in bathroom.  Hypoxia. EXAM: CT CERVICAL SPINE WITHOUT CONTRAST TECHNIQUE: Multidetector CT imaging of the cervical spine was performed without intravenous contrast. Multiplanar CT image reconstructions were also generated. RADIATION DOSE REDUCTION: This exam was performed according to the departmental dose-optimization program which includes automated exposure control, adjustment of the mA and/or kV according to patient size and/or use of iterative reconstruction technique. COMPARISON:  Head and face CT today. FINDINGS: Alignment: Straightening of cervical lordosis. Mild degenerative appearing anterolisthesis of C4 on C5. Bilateral posterior element alignment is within normal limits. Maintained cervicothoracic junction alignment. Skull base and vertebrae: Bone mineralization is within normal limits for age. Visualized skull base is intact. No atlanto-occipital dissociation. C1 and C2 appear intact and aligned. No acute osseous abnormality identified. Soft tissues and spinal canal: No prevertebral fluid or swelling. No visible canal hematoma. Negative visible noncontrast neck soft tissues; calcified and partially retropharyngeal cervical carotids. Disc levels: Degenerative appearing ankylosis of both C2-C3 and C3-C4 facets. Superimposed degenerative appearing anterolisthesis of C4 on C5 with up to moderate facet arthropathy at that level. C5-C6 through cervicothoracic junction disc and endplate degeneration. But probably no significant cervical spinal stenosis. Upper chest: Visible upper thoracic levels appear intact. Negative lung apices. Thoracic inlet calcified atherosclerosis. Trace intravenous gas at the thoracic inlet, likely IV access related.  IMPRESSION: 1. No acute traumatic injury identified in the cervical spine. 2. 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 Electronically Signed   By: Odessa Fleming M.D.   On: 10/10/2022 07:05   DG Chest Portable 1 View  Result Date: 10/10/2022 CLINICAL DATA:  72 year old male found down in bathroom.  Hypoxia.  EXAM: PORTABLE CHEST 1 VIEW COMPARISON:  Portable chest 03/30/2022 and earlier. FINDINGS: Portable AP semi upright views at 0641 hours. Stable tortuosity of the thoracic aorta. Other mediastinal contours are within normal limits. Mildly lower lung volumes. Allowing for portable technique the lungs are clear. Visualized tracheal air column is within normal limits. No pneumothorax or pleural effusion. Bulky chronic thoracic vertebral endplate spurring. No acute osseous abnormality identified. Paucity of bowel gas. IMPRESSION: No acute cardiopulmonary abnormality or acute traumatic injury identified. Electronically Signed   By: Odessa Fleming M.D.   On: 10/10/2022 07:00   CT Maxillofacial Wo Contrast  Result Date: 10/10/2022 CLINICAL DATA:  72 year old male found down in bathroom.  Hypoxia. EXAM: CT MAXILLOFACIAL WITHOUT CONTRAST TECHNIQUE: Multidetector CT imaging of the maxillofacial structures was performed. Multiplanar CT image reconstructions were also generated. RADIATION DOSE REDUCTION: This exam was performed according to the departmental dose-optimization program which includes automated exposure control, adjustment of the mA and/or kV according to patient size and/or use of iterative reconstruction technique. COMPARISON:  Head CT today. FINDINGS: Osseous: Absent dentition. Mandible intact and normally located. Left TMJ degeneration. Bilateral maxilla, zygoma, pterygoid bones appear intact. Mild right nasal bone fracture at the anterior tip series 4, image 68, new compared to 08/27/2022. Left nasal bones appear stable. Central skull  base appears intact. Cervical spine detailed separately. Orbits: Bilateral orbital walls appear intact. Postoperative changes to both globes. Intraorbital soft tissues otherwise appear normal. Sinuses: Clear throughout. Soft tissues: Negative visible noncontrast larynx, pharynx, parapharyngeal spaces, retropharyngeal space, sublingual space, masticator and parotid spaces. Both submandibular glands are atrophied. Partially retropharyngeal course of the bilateral cervical carotids with calcified atherosclerosis. Cervical lymphadenopathy. Limited intracranial: Reported separately.  No upper IMPRESSION: 1. Mild acute right nasal bone fracture. 2. No other acute traumatic injury identified in the Face. 3. Cervical spine CT detailed separately. Electronically Signed   By: Odessa Fleming M.D.   On: 10/10/2022 06:58   CT HEAD WO CONTRAST ( )  Result Date: 10/10/2022 CLINICAL DATA:  72 year old male found down in bathroom.  Hypoxia. EXAM: CT HEAD WITHOUT CONTRAST TECHNIQUE: Contiguous axial images were obtained from the base of the skull through the vertex without intravenous contrast. RADIATION DOSE REDUCTION: This exam was performed according to the departmental dose-optimization program which includes automated exposure control, adjustment of the mA and/or kV according to patient size and/or use of iterative reconstruction technique. COMPARISON:  Brain MRI 07/15/2017. Head CT 08/27/2022. Face and cervical spine CT today reported separately. FINDINGS: Brain: Stable cerebral volume. No midline shift, ventriculomegaly, mass effect, evidence of mass lesion, intracranial hemorrhage or evidence of cortically based acute infarction. Chronic small vessel disease with stable scattered hypodensity in the cerebral white matter, left lentiform, left thalamus. These are stable since August but progressed from the 2019 MRI. Vascular: No suspicious intracranial vascular hyperdensity. Calcified atherosclerosis at the skull base. Skull: No  acute osseous abnormality identified. Sinuses/Orbits: Visualized paranasal sinuses and mastoids are stable and well aerated. Other: No acute orbit or scalp soft tissue finding. IMPRESSION: 1. No acute intracranial abnormality or acute traumatic injury identified. 2. Stable noncontrast CT appearance of moderately advanced small vessel disease. Electronically Signed   By: Odessa Fleming M.D.   On: 10/10/2022 06:55    Microbiology: Recent Results (from the past 240 hour(s))  SARS Coronavirus 2 by RT PCR (hospital order, performed in [facility] hospital lab) *cepheid single result test* Anterior Nasal Swab     Status: None   Collection Time: 10/10/22  6:25 AM  Specimen: Anterior Nasal Swab  Result Value Ref Range Status   SARS Coronavirus 2 by RT PCR NEGATIVE NEGATIVE Final    Comment: (NOTE) SARS-CoV-2 target nucleic acids are NOT DETECTED.  The SARS-CoV-2 RNA is generally detectable in upper and lower respiratory specimens during the acute phase of infection. The lowest concentration of SARS-CoV-2 viral copies this assay can detect is 250 copies / mL. A negative result does not preclude SARS-CoV-2 infection and should not be used as the sole basis for treatment or other patient management decisions.  A negative result may occur with improper specimen collection / handling, submission of specimen other than nasopharyngeal swab, presence of viral mutation(s) within the areas targeted by this assay, and inadequate number of viral copies (<250 copies / mL). A negative result must be combined with clinical observations, patient history, and epidemiological information.  Fact Sheet for Patients:   RoadLapTop.co.za  Fact Sheet for Healthcare Providers: http://kim-miller.com/  This test is not yet approved or  cleared by the [place] FDA and has been authorized for detection and/or diagnosis of SARS-CoV-2 by FDA under an Emergency Use Authorization  (EUA).  This EUA will remain in effect (meaning this test can be used) for the duration of the COVID-19 declaration under Section 564(b)(1) of the Act, 21 U.S.C. section 360bbb-3(b)(1), unless the authorization is terminated or revoked sooner.  Performed at [facility], [address] [place], [place] [address]   Blood culture (routine x 2)     Status: None (Preliminary result)   Collection Time: 10/10/22  8:53 AM   Specimen: BLOOD RIGHT ARM  Result Value Ref Range Status   Specimen Description BLOOD RIGHT ARM  Final   Special Requests   Final    BOTTLES DRAWN AEROBIC AND ANAEROBIC Blood Culture results may not be optimal due to an excessive volume of blood received in culture bottles   Culture   Final    NO GROWTH < 24 HOURS Performed at [facility], [address], [place], [place] [address]    Report Status PENDING  Incomplete  Urine Culture     Status: None   Collection Time: 10/10/22  8:53 AM   Specimen: Urine, Clean Catch  Result Value Ref Range Status   Specimen Description   Final    URINE, CLEAN CATCH Performed at [facility], [address], [place], [place] [address]    Special Requests   Final    NONE Performed at [facility], [address] [place], [place] [address]    Culture   Final    NO GROWTH Performed at [facility], [address] [place], [place] [address]    Report Status 10/11/2022 FINAL  Final  Blood culture (routine x 2)     Status: None (Preliminary result)   Collection Time: 10/10/22 10:59 AM   Specimen: BLOOD  Result Value Ref Range Status   Specimen Description BLOOD LEFT ANTECUBITAL  Final   Special Requests   Final    BOTTLES DRAWN AEROBIC AND ANAEROBIC Blood Culture adequate volume   Culture   Final    NO GROWTH < 24 HOURS Performed at [facility], [address] [place], [place] [address]    Report Status PENDING  Incomplete  MRSA Next Gen by PCR, Nasal     Status:  None   Collection Time: 10/10/22 12:51 PM   Specimen: Nasal Mucosa; Nasal Swab  Result Value Ref Range Status   MRSA by  PCR Next Gen NOT DETECTED NOT DETECTED Final    Comment: (NOTE) The GeneXpert MRSA Assay (FDA approved for NASAL specimens only), is one component of a comprehensive MRSA colonization surveillance program. It is not intended to diagnose MRSA infection nor to guide or monitor treatment for MRSA infections. Test performance is not FDA approved in patients less than 22 years old. Performed at [facility], [address] [place], [place] [address]      Labs: CBC: Recent Labs  Lab 10/10/22 0625 10/11/22 0425  WBC 6.1 6.4  NEUTROABS 4.1  --   HGB 10.4* 9.5*  HCT 28.6* 27.3*  MCV 90.2 94.1  PLT 235 184   Basic Metabolic Panel: Recent Labs  Lab 10/10/22 0625 10/10/22 1441 10/10/22 1706 10/11/22 0425  NA 135 134* 133* 135  K 2.5* 3.2* 3.0* 3.3*  CL 101 104 106 106  CO2 22 21* 19* 22  GLUCOSE 87 110* 92 99  BUN 29* 23 21 16   CREATININE 1.43* 1.05 0.99 0.85  CALCIUM 8.3* 7.7* 7.8* 7.8*  MG 1.8  --   --  1.7  PHOS 4.0  --   --  2.5   Liver Function Tests: Recent Labs  Lab 10/10/22 0625 10/11/22 0425  AST 21  --   ALT 24  --   ALKPHOS 86  --   BILITOT 0.7  --   PROT 6.2*  --   ALBUMIN 3.2* 2.7*   No results for input(s): "LIPASE", "AMYLASE" in the last 168 hours. No results for input(s): "AMMONIA" in the last 168 hours. Cardiac Enzymes: No results for input(s): "CKTOTAL", "CKMB", "CKMBINDEX", "TROPONINI" in the last 168 hours. BNP (last 3 results) Recent Labs    10/10/22 0625  BNP 27.8   CBG: Recent Labs  Lab 10/10/22 0808 10/10/22 0850 10/10/22 0948 10/10/22 1134 10/10/22 1248  GLUCAP 93 105* 132* 117* 116*    Time spent: 35 minutes  Signed:  Gillis Santa  [facility] 10/11/2022 4:30 PM

## 2022-10-11 NOTE — Consult Note (Addendum)
Consultation Note Date: 10/11/2022   Patient Name: Michael Humphrey  DOB: 04/11/1950  MRN: [id number]  Age / Sex: 72 y.o., male  PCP: Lauro Regulus, MD Referring Physician: Gillis Santa, MD  Reason for Consultation: Establishing goals of care  HPI/Patient Profile: 72 year old male with past medical history significant for recent recurrent left eye endophthalmitis and Enterococcus faecalis UTI admitted with acute metabolic encephalopathy status post fall, undifferentiated shock, along with concern for sepsis currently of unknown etiology, and AKI on CKD.   presented to the ED s/p fall with Altered Mental Status at home this morning. Past medical history significant for HTN, CAD, DM, HLD, and left eye endophthalmitis. Per wife report, his last known well time was around 10:00 PM last night, and this morning upon awakening she heard the patient fall in other room, and is unsure whether he tripped or had a syncopal episode. EMS report states upon arrival, O2 sats were in 50s and patient was unresponsive and experiencing periods of apnea. Patient reports pain in left eye and neck tenderness.    Clinical Assessment and Goals of Care: Consult noted for goals of care.  Notes and labs reviewed.  Nursing discusses the patient has been uncomfortable.    Into see patient.  Patient's wife and sister-in-law are at bedside.  Patient appears restless and only states that he cannot get comfortable and that he just wants to go home.  Wife and sister-in-law discussed that he has been restless and uncomfortable as well.  Discussed planning with wife and wife states that they have spoken with hospice liaison, patient has been accepted and patient will be going to the hospice facility for end-of-life care.   Comfort medications reviewed and adjustments made.  I completed a MOST form today with wife and the signed  original was placed in the chart. Each section of options on the form were reviewed in full detail and any questions were answered as needed. The form was scanned and sent to medical records for it to be uploaded under ACP tab in Epic. A photocopy was also placed in the chart to be scanned into EMR. The patient outlined their wishes for the following treatment decisions:  Cardiopulmonary Resuscitation: Do Not Attempt Resuscitation (DNR/No CPR)  Medical Interventions: Comfort Measures: Keep clean, warm, and dry. Use medication by any route, positioning, wound care, and other measures to relieve pain and suffering. Use oxygen, suction and manual treatment of airway obstruction as needed for comfort. Do not transfer to the hospital unless comfort needs cannot be met in current location.  Antibiotics: No antibiotics (use other measures to relieve symptoms)  IV Fluids: No IV fluids (provide other measures to ensure comfort)  Feeding Tube: No feeding tube     SUMMARY OF RECOMMENDATIONS   Patient discharging to hospice facility.        Primary Diagnoses: Present on Admission:  Acute metabolic encephalopathy   I have reviewed the medical record, interviewed the patient and family, and examined the patient. The following aspects are pertinent.  Past Medical History:  Diagnosis Date   Anginal pain (HCC)    Arthritis    BPH (benign prostatic hyperplasia)    CAD (coronary artery disease)    a.) LHC 08/22/2012: normal coronaries; b.) LHC 06/08/2021: EF 55-65%. 30% oLM, 100% p-mLAD, 50% o-pLAD, 60% pLAD, 30% pRCA, 75% RPDA, 75% RPAV -- med mgmt.   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   DDD (degenerative disc disease), lumbar    Diabetic retinopathy (HCC)    DISH (diffuse idiopathic skeletal hyperostosis)    Diverticulitis    Full dentures    GERD (gastroesophageal reflux disease)    Heart attack (HCC)    High cholesterol    Hordeolum internum right lower eyelid 12/09/2019   Hypertension    Incomplete right bundle branch block (RBBB)    Lumbar radiculitis    a.) s/p L4-L5 decompression   Lumbar radiculopathy    Posterior capsular opacification, 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 Janee Morn recently and sent here   Pulmonary embolism (HCC)    Skin cancer    Sleep apnea    a.) unable to tolerate nocturnal PAP therapy   Spinal stenosis of lumbar region, unspecified whether neurogenic claudication present    Statin intolerance    T2DM (type 2 diabetes mellitus) (HCC)    Tubular adenoma of colon    Social History   Socioeconomic History   Marital status: Married    Spouse name: Elease Hashimoto   Number of children: Not on file   Years of education: Not on file   Highest education level: Not on file  Occupational History   Not on file  Tobacco Use   Smoking status: Never   Smokeless tobacco: Former    Types: Chew    Quit date: 02/08/1978   Tobacco comments:    No vap, E cig, chew  Vaping Use   Vaping status: Never Used  Substance and Sexual Activity   Alcohol use: Not Currently   Drug use: No   Sexual activity: Not on file  Other Topics Concern   Not on file  Social History Narrative   Not on file   Social Determinants of Health   Financial Resource Strain: Low Risk  (04/02/2022)   [facility], [facility]   Overall Financial Resource Strain (CARDIA)    Difficulty of Paying Living Expenses: Not hard at all  Food Insecurity: No Food Insecurity (08/27/2022)   Hunger Vital Sign    Worried About Running Out of Food in the Last Year: Never true    Ran Out of Food in the  Last Year: Never true  Transportation Needs: No Transportation Needs (08/27/2022)   PRAPARE - Administrator, Civil Service (Medical): No    Lack of Transportation (Non-Medical): No  Physical Activity: Not on file  Stress: Not on file  Social Connections: Not on file   Family History  Problem Relation Age of Onset   Clotting disorder Father    Hypertension Father    Scheduled Meds:  Chlorhexidine Gluconate Cloth  6 each Topical Daily   feeding supplement  237 mL  Oral BID BM   Continuous Infusions: PRN Meds:.acetaminophen **OR** acetaminophen, antiseptic oral rinse, docusate sodium, glycopyrrolate **OR** glycopyrrolate **OR** glycopyrrolate, haloperidol **OR** haloperidol **OR** haloperidol lactate, morphine injection, ondansetron **OR** ondansetron (ZOFRAN) IV, polyethylene glycol, polyvinyl alcohol Medications Prior to Admission:  Prior to Admission medications   Medication Sig Start Date End Date Taking? Authorizing Provider  acetaminophen (TYLENOL) 500 MG tablet Take 1,000 mg by mouth every 6 (six) hours as needed.   Yes [provider]  carvedilol (COREG) 12.5 MG tablet Take 12.5 mg by mouth 2 (two) times daily.  12/04/17  Yes [provider]  finasteride (PROSCAR) 5 MG tablet Take 5 mg by mouth every morning.    Yes [provider]  gabapentin (NEURONTIN) 600 MG tablet Take 600 mg by mouth 2 (two) times daily.   Yes [provider]  glimepiride (AMARYL) 1 MG tablet Take 1 mg by mouth daily with breakfast.   Yes [provider]  lisinopril (PRINIVIL,ZESTRIL) 2.5 MG tablet Take 1 tablet (2.5 mg total) by mouth daily. Patient taking differently: Take 2.5 mg by mouth every morning. 02/28/18  Yes Sainani, Rolly Pancake, MD  metFORMIN (GLUCOPHAGE) 500 MG tablet Take 500 mg by mouth 2 (two) times daily.   Yes [provider]  methocarbamol (ROBAXIN) 750 MG tablet Take 1 tablet (750 mg total) by mouth 3 (three) times daily. 03/22/22   Yes Susanne Borders, PA  mirtazapine (REMERON) 7.5 MG tablet Take 7.5 mg by mouth at bedtime. 10/02/22  Yes [provider]  rosuvastatin (CRESTOR) 5 MG tablet Take 5 mg by mouth daily.   Yes [provider]  senna (SENOKOT) 8.6 MG TABS tablet Take 1 tablet (8.6 mg total) by mouth daily as needed for mild constipation. 03/22/22  Yes Susanne Borders, PA  tamsulosin (FLOMAX) 0.4 MG CAPS capsule Take 0.4 mg by mouth daily. 04/03/22 04/03/23 Yes [provider]  torsemide (DEMADEX) 20 MG tablet Take 20 mg by mouth daily. 08/06/22  Yes [provider]  aspirin EC 81 MG tablet Take 81 mg by mouth daily. Swallow whole. Patient not taking: Reported on 10/10/2022    [provider]   Allergies  Allergen Reactions   Clonidine Derivatives     Hallucinations    Fioricet [Butalbital-Apap-Caffeine]     hallucination   Hydrocodone-Acetaminophen     hallucinations   Mobic [Meloxicam]     hallucinations   Norvasc [Amlodipine Besylate]     hallucinations   Statins     Muscle cramps   Tramadol Itching   Review of Systems  Constitutional:        Just states he cannot get comfortable, does not say further    Physical Exam Pulmonary:     Effort: Pulmonary effort is normal.  Neurological:     Mental Status: He is alert.  Psychiatric:     Comments: Restless     Vital Signs: BP (!) 94/51 (BP Location: Left Arm)   Pulse 82   Temp 97.6 F (36.4 C)   Resp 20   Ht 5\' 9"  (1.753 m)   Wt 72.7 kg   SpO2 96%   BMI 23.67 kg/m  Pain Scale: Faces POSS *See Group Information*: S-Acceptable,Sleep, easy to arouse Pain Score: 2    SpO2: SpO2: 96 % O2 Device:SpO2: 96 % O2 Flow Rate: .O2 Flow Rate (L/min): 3 L/min  IO: Intake/output summary:  Intake/Output Summary (Last 24 hours) at 10/11/2022 1637 Last data filed at 10/11/2022 1317 Gross per 24  hour  Intake 2994.52 ml  Output 1260 ml  Net 1734.52 ml    LBM:   Baseline Weight: Weight: 70.1 kg (per  ED nurse on 10/10/22) Most recent weight: Weight: 72.7 kg       Signed by: Morton Stall, NP   Please contact Palliative Medicine Team phone at [phone] for questions and concerns.  For individual provider: See Loretha Stapler

## 2022-10-11 NOTE — Progress Notes (Signed)
Report called to [facility].  Pts v/s are stable at this time. Family is at bedside and updated with plan of care. Pt is to be transferred to room 125.

## 2022-10-11 NOTE — Progress Notes (Signed)
[facility]- Civil engineer, contracting [facility])    Received request from Transitions of Care Manger for family interest in [facility].  Eligibility has been confirmed.  Met/Spoke with patient and family to confirm interest and explain services.  Family agreeable to transfer today at 9pm.  Transitions of Care manager aware.  RN please call report to [phone] [facility]) prior to patient leaving the unit.  Please send signed and completed DNR with patient at discharge.  Thank you  Redge Gainer,  [facility] Liaison [phone]

## 2022-10-15 LAB — CULTURE, BLOOD (ROUTINE X 2)
Culture: NO GROWTH
Culture: NO GROWTH
Special Requests: ADEQUATE

## 2022-11-02 DEATH — deceased

## 2022-12-06 ENCOUNTER — Ambulatory Visit: Payer: PPO | Admitting: Neurosurgery

## 2023-01-29 ENCOUNTER — Other Ambulatory Visit (HOSPITAL_BASED_OUTPATIENT_CLINIC_OR_DEPARTMENT_OTHER): Payer: Self-pay

## 2023-04-23 ENCOUNTER — Ambulatory Visit: Payer: PPO | Admitting: Physician Assistant
